# Patient Record
Sex: Female | Born: 1960 | Race: White | Hispanic: No | Marital: Married | Smoking: Former smoker
Health system: Southern US, Community
[De-identification: ages and names within clinical notes are randomized; demographics above are authoritative.]

## PROBLEM LIST (undated history)

## (undated) DIAGNOSIS — Z8041 Family history of malignant neoplasm of ovary: Secondary | ICD-10-CM

## (undated) DIAGNOSIS — M1990 Unspecified osteoarthritis, unspecified site: Secondary | ICD-10-CM

## (undated) DIAGNOSIS — N736 Female pelvic peritoneal adhesions (postinfective): Secondary | ICD-10-CM

## (undated) DIAGNOSIS — K219 Gastro-esophageal reflux disease without esophagitis: Secondary | ICD-10-CM

## (undated) DIAGNOSIS — Z800 Family history of malignant neoplasm of digestive organs: Secondary | ICD-10-CM

## (undated) DIAGNOSIS — R112 Nausea with vomiting, unspecified: Secondary | ICD-10-CM

## (undated) DIAGNOSIS — N189 Chronic kidney disease, unspecified: Secondary | ICD-10-CM

## (undated) DIAGNOSIS — F32A Depression, unspecified: Secondary | ICD-10-CM

## (undated) DIAGNOSIS — Z923 Personal history of irradiation: Secondary | ICD-10-CM

## (undated) DIAGNOSIS — C801 Malignant (primary) neoplasm, unspecified: Secondary | ICD-10-CM

## (undated) DIAGNOSIS — K5790 Diverticulosis of intestine, part unspecified, without perforation or abscess without bleeding: Secondary | ICD-10-CM

## (undated) DIAGNOSIS — K649 Unspecified hemorrhoids: Secondary | ICD-10-CM

## (undated) DIAGNOSIS — K222 Esophageal obstruction: Secondary | ICD-10-CM

## (undated) DIAGNOSIS — Z8489 Family history of other specified conditions: Secondary | ICD-10-CM

## (undated) DIAGNOSIS — R1013 Epigastric pain: Secondary | ICD-10-CM

## (undated) DIAGNOSIS — R0600 Dyspnea, unspecified: Secondary | ICD-10-CM

## (undated) DIAGNOSIS — R51 Headache: Secondary | ICD-10-CM

## (undated) DIAGNOSIS — M722 Plantar fascial fibromatosis: Secondary | ICD-10-CM

## (undated) DIAGNOSIS — M6740 Ganglion, unspecified site: Secondary | ICD-10-CM

## (undated) DIAGNOSIS — J45909 Unspecified asthma, uncomplicated: Secondary | ICD-10-CM

## (undated) DIAGNOSIS — I10 Essential (primary) hypertension: Secondary | ICD-10-CM

## (undated) DIAGNOSIS — E039 Hypothyroidism, unspecified: Secondary | ICD-10-CM

## (undated) DIAGNOSIS — F329 Major depressive disorder, single episode, unspecified: Secondary | ICD-10-CM

## (undated) DIAGNOSIS — C50919 Malignant neoplasm of unspecified site of unspecified female breast: Secondary | ICD-10-CM

## (undated) DIAGNOSIS — F419 Anxiety disorder, unspecified: Secondary | ICD-10-CM

## (undated) DIAGNOSIS — R519 Headache, unspecified: Secondary | ICD-10-CM

## (undated) DIAGNOSIS — Z9889 Other specified postprocedural states: Secondary | ICD-10-CM

## (undated) HISTORY — DX: Personal history of irradiation: Z92.3

## (undated) HISTORY — DX: Unspecified hemorrhoids: K64.9

## (undated) HISTORY — DX: Hypothyroidism, unspecified: E03.9

## (undated) HISTORY — DX: Family history of malignant neoplasm of digestive organs: Z80.0

## (undated) HISTORY — DX: Anxiety disorder, unspecified: F41.9

## (undated) HISTORY — PX: CHOLECYSTECTOMY: SHX55

## (undated) HISTORY — DX: Essential (primary) hypertension: I10

## (undated) HISTORY — DX: Malignant neoplasm of unspecified site of unspecified female breast: C50.919

## (undated) HISTORY — DX: Epigastric pain: R10.13

## (undated) HISTORY — PX: MASTECTOMY: SHX3

## (undated) HISTORY — DX: Unspecified osteoarthritis, unspecified site: M19.90

## (undated) HISTORY — DX: Gastro-esophageal reflux disease without esophagitis: K21.9

## (undated) HISTORY — DX: Diverticulosis of intestine, part unspecified, without perforation or abscess without bleeding: K57.90

## (undated) HISTORY — DX: Ganglion, unspecified site: M67.40

## (undated) HISTORY — PX: HEMORRHOID SURGERY: SHX153

## (undated) HISTORY — PX: TONSILLECTOMY: SUR1361

## (undated) HISTORY — PX: ABDOMINAL HYSTERECTOMY: SHX81

## (undated) HISTORY — DX: Esophageal obstruction: K22.2

## (undated) HISTORY — DX: Family history of malignant neoplasm of ovary: Z80.41

## (undated) HISTORY — PX: OTHER SURGICAL HISTORY: SHX169

## (undated) HISTORY — PX: TUBAL LIGATION: SHX77

---

## 1997-06-29 ENCOUNTER — Other Ambulatory Visit: Admission: RE | Admit: 1997-06-29 | Discharge: 1997-06-29 | Payer: Self-pay | Admitting: Obstetrics and Gynecology

## 1997-08-08 ENCOUNTER — Ambulatory Visit (HOSPITAL_COMMUNITY): Admission: RE | Admit: 1997-08-08 | Discharge: 1997-08-08 | Payer: Self-pay | Admitting: Obstetrics and Gynecology

## 1997-09-07 ENCOUNTER — Ambulatory Visit (HOSPITAL_COMMUNITY): Admission: RE | Admit: 1997-09-07 | Discharge: 1997-09-07 | Payer: Self-pay | Admitting: Obstetrics and Gynecology

## 1999-04-21 ENCOUNTER — Other Ambulatory Visit: Admission: RE | Admit: 1999-04-21 | Discharge: 1999-04-21 | Payer: Self-pay | Admitting: Obstetrics and Gynecology

## 1999-04-23 ENCOUNTER — Ambulatory Visit (HOSPITAL_COMMUNITY): Admission: RE | Admit: 1999-04-23 | Discharge: 1999-04-23 | Payer: Self-pay | Admitting: Obstetrics and Gynecology

## 1999-04-23 ENCOUNTER — Encounter: Payer: Self-pay | Admitting: Obstetrics and Gynecology

## 1999-04-28 ENCOUNTER — Encounter: Payer: Self-pay | Admitting: Obstetrics and Gynecology

## 1999-04-28 ENCOUNTER — Ambulatory Visit (HOSPITAL_COMMUNITY): Admission: RE | Admit: 1999-04-28 | Discharge: 1999-04-28 | Payer: Self-pay | Admitting: Obstetrics and Gynecology

## 1999-05-05 ENCOUNTER — Encounter: Payer: Self-pay | Admitting: Obstetrics and Gynecology

## 1999-05-05 ENCOUNTER — Ambulatory Visit (HOSPITAL_COMMUNITY): Admission: RE | Admit: 1999-05-05 | Discharge: 1999-05-05 | Payer: Self-pay | Admitting: Obstetrics and Gynecology

## 1999-10-14 ENCOUNTER — Encounter: Payer: Self-pay | Admitting: General Surgery

## 1999-10-14 ENCOUNTER — Ambulatory Visit (HOSPITAL_COMMUNITY): Admission: RE | Admit: 1999-10-14 | Discharge: 1999-10-14 | Payer: Self-pay | Admitting: General Surgery

## 2000-04-15 ENCOUNTER — Encounter: Payer: Self-pay | Admitting: General Surgery

## 2000-04-15 ENCOUNTER — Ambulatory Visit (HOSPITAL_COMMUNITY): Admission: RE | Admit: 2000-04-15 | Discharge: 2000-04-15 | Payer: Self-pay | Admitting: General Surgery

## 2000-06-14 ENCOUNTER — Ambulatory Visit (HOSPITAL_COMMUNITY): Admission: RE | Admit: 2000-06-14 | Discharge: 2000-06-14 | Payer: Self-pay | Admitting: Internal Medicine

## 2000-06-14 ENCOUNTER — Encounter: Payer: Self-pay | Admitting: Internal Medicine

## 2001-03-17 ENCOUNTER — Other Ambulatory Visit: Admission: RE | Admit: 2001-03-17 | Discharge: 2001-03-17 | Payer: Self-pay | Admitting: Obstetrics and Gynecology

## 2001-03-21 ENCOUNTER — Encounter: Payer: Self-pay | Admitting: Obstetrics and Gynecology

## 2001-03-21 ENCOUNTER — Ambulatory Visit (HOSPITAL_COMMUNITY): Admission: RE | Admit: 2001-03-21 | Discharge: 2001-03-21 | Payer: Self-pay | Admitting: Obstetrics and Gynecology

## 2001-04-11 ENCOUNTER — Encounter: Payer: Self-pay | Admitting: General Surgery

## 2001-04-11 ENCOUNTER — Ambulatory Visit (HOSPITAL_COMMUNITY): Admission: RE | Admit: 2001-04-11 | Discharge: 2001-04-11 | Payer: Self-pay | Admitting: General Surgery

## 2002-01-24 ENCOUNTER — Ambulatory Visit (HOSPITAL_COMMUNITY): Admission: RE | Admit: 2002-01-24 | Discharge: 2002-01-24 | Payer: Self-pay | Admitting: Internal Medicine

## 2002-01-24 ENCOUNTER — Encounter: Payer: Self-pay | Admitting: Internal Medicine

## 2002-03-31 ENCOUNTER — Ambulatory Visit (HOSPITAL_COMMUNITY): Admission: RE | Admit: 2002-03-31 | Discharge: 2002-03-31 | Payer: Self-pay | Admitting: General Surgery

## 2002-03-31 ENCOUNTER — Encounter: Payer: Self-pay | Admitting: General Surgery

## 2002-05-05 ENCOUNTER — Ambulatory Visit (HOSPITAL_COMMUNITY): Admission: RE | Admit: 2002-05-05 | Discharge: 2002-05-05 | Payer: Self-pay | Admitting: Obstetrics and Gynecology

## 2002-05-05 ENCOUNTER — Encounter: Payer: Self-pay | Admitting: Obstetrics and Gynecology

## 2002-05-30 ENCOUNTER — Encounter: Payer: Self-pay | Admitting: Internal Medicine

## 2003-04-30 ENCOUNTER — Emergency Department (HOSPITAL_COMMUNITY): Admission: EM | Admit: 2003-04-30 | Discharge: 2003-04-30 | Payer: Self-pay | Admitting: Emergency Medicine

## 2003-05-10 ENCOUNTER — Observation Stay (HOSPITAL_COMMUNITY): Admission: RE | Admit: 2003-05-10 | Discharge: 2003-05-11 | Payer: Self-pay | Admitting: Internal Medicine

## 2003-05-10 ENCOUNTER — Encounter: Payer: Self-pay | Admitting: Internal Medicine

## 2003-07-06 ENCOUNTER — Ambulatory Visit (HOSPITAL_COMMUNITY): Admission: RE | Admit: 2003-07-06 | Discharge: 2003-07-06 | Payer: Self-pay | Admitting: Urology

## 2003-07-06 ENCOUNTER — Ambulatory Visit (HOSPITAL_BASED_OUTPATIENT_CLINIC_OR_DEPARTMENT_OTHER): Admission: RE | Admit: 2003-07-06 | Discharge: 2003-07-06 | Payer: Self-pay | Admitting: Urology

## 2003-07-19 ENCOUNTER — Inpatient Hospital Stay (HOSPITAL_COMMUNITY): Admission: RE | Admit: 2003-07-19 | Discharge: 2003-07-21 | Payer: Self-pay | Admitting: Urology

## 2003-09-17 ENCOUNTER — Emergency Department (HOSPITAL_COMMUNITY): Admission: EM | Admit: 2003-09-17 | Discharge: 2003-09-17 | Payer: Self-pay | Admitting: Emergency Medicine

## 2003-11-26 ENCOUNTER — Ambulatory Visit (HOSPITAL_COMMUNITY): Admission: RE | Admit: 2003-11-26 | Discharge: 2003-11-26 | Payer: Self-pay | Admitting: Urology

## 2004-04-14 ENCOUNTER — Ambulatory Visit: Payer: Self-pay | Admitting: Internal Medicine

## 2004-05-22 ENCOUNTER — Ambulatory Visit (HOSPITAL_COMMUNITY): Admission: RE | Admit: 2004-05-22 | Discharge: 2004-05-22 | Payer: Self-pay | Admitting: Urology

## 2005-03-18 ENCOUNTER — Ambulatory Visit: Payer: Self-pay | Admitting: Internal Medicine

## 2005-04-13 ENCOUNTER — Other Ambulatory Visit: Admission: RE | Admit: 2005-04-13 | Discharge: 2005-04-13 | Payer: Self-pay | Admitting: Obstetrics and Gynecology

## 2005-06-17 ENCOUNTER — Ambulatory Visit: Payer: Self-pay | Admitting: Internal Medicine

## 2005-06-22 ENCOUNTER — Ambulatory Visit: Payer: Self-pay | Admitting: Internal Medicine

## 2006-06-14 ENCOUNTER — Ambulatory Visit: Payer: Self-pay | Admitting: Internal Medicine

## 2006-06-14 LAB — CONVERTED CEMR LAB
ALT: 13 units/L (ref 0–40)
AST: 21 units/L (ref 0–37)
Albumin: 3.8 g/dL (ref 3.5–5.2)
Alkaline Phosphatase: 51 units/L (ref 39–117)
BUN: 10 mg/dL (ref 6–23)
Basophils Absolute: 0.1 10*3/uL (ref 0.0–0.1)
Basophils Relative: 1 % (ref 0.0–1.0)
Bilirubin Urine: NEGATIVE
Bilirubin, Direct: 0.1 mg/dL (ref 0.0–0.3)
CO2: 26 meq/L (ref 19–32)
Calcium: 8.8 mg/dL (ref 8.4–10.5)
Chloride: 110 meq/L (ref 96–112)
Cholesterol: 111 mg/dL (ref 0–200)
Creatinine, Ser: 0.8 mg/dL (ref 0.4–1.2)
Eosinophils Absolute: 0.1 10*3/uL (ref 0.0–0.6)
Eosinophils Relative: 2.3 % (ref 0.0–5.0)
GFR calc Af Amer: 100 mL/min
GFR calc non Af Amer: 83 mL/min
Glucose, Bld: 108 mg/dL — ABNORMAL HIGH (ref 70–99)
HCT: 36.2 % (ref 36.0–46.0)
HDL: 45.8 mg/dL (ref >39.0)
Hemoglobin, Urine: NEGATIVE
Hemoglobin: 11.9 g/dL — ABNORMAL LOW (ref 12.0–15.0)
Ketones, ur: NEGATIVE mg/dL
LDL Cholesterol: 47 mg/dL (ref 0–99)
Leukocytes, UA: NEGATIVE
Lymphocytes Relative: 37 % (ref 12.0–46.0)
MCHC: 32.7 g/dL (ref 30.0–36.0)
MCV: 80 fL (ref 78.0–100.0)
Monocytes Absolute: 0.6 10*3/uL (ref 0.2–0.7)
Monocytes Relative: 9.7 % (ref 3.0–11.0)
Neutro Abs: 2.8 10*3/uL (ref 1.4–7.7)
Neutrophils Relative %: 50 % (ref 43.0–77.0)
Nitrite: NEGATIVE
Platelets: 234 10*3/uL (ref 150–400)
Potassium: 3.8 meq/L (ref 3.5–5.1)
RBC: 4.53 M/uL (ref 3.87–5.11)
RDW: 14.8 % — ABNORMAL HIGH (ref 11.5–14.6)
Sodium: 142 meq/L (ref 135–145)
Specific Gravity, Urine: <=1.005 (ref 1.000–1.03)
TSH: 0.78 microintl units/mL (ref 0.35–5.50)
Total Bilirubin: 0.8 mg/dL (ref 0.3–1.2)
Total CHOL/HDL Ratio: 2.4
Total Protein, Urine: NEGATIVE mg/dL
Total Protein: 6.2 g/dL (ref 6.0–8.3)
Triglycerides: 93 mg/dL (ref 0–149)
Urine Glucose: NEGATIVE mg/dL
Urobilinogen, UA: 0.2 (ref 0.0–1.0)
VLDL: 19 mg/dL (ref 0–40)
WBC: 5.7 10*3/uL (ref 4.5–10.5)
pH: 7 (ref 5.0–8.0)

## 2006-06-24 ENCOUNTER — Ambulatory Visit: Payer: Self-pay | Admitting: Internal Medicine

## 2007-01-19 ENCOUNTER — Encounter: Payer: Self-pay | Admitting: Internal Medicine

## 2007-02-02 ENCOUNTER — Telehealth: Payer: Self-pay | Admitting: Internal Medicine

## 2007-02-02 ENCOUNTER — Ambulatory Visit: Payer: Self-pay | Admitting: Internal Medicine

## 2007-02-02 LAB — CONVERTED CEMR LAB
ALT: 14 units/L (ref 0–35)
AST: 16 units/L (ref 0–37)
Albumin: 4 g/dL (ref 3.5–5.2)
Alkaline Phosphatase: 61 units/L (ref 39–117)
Amylase: 66 units/L (ref 27–131)
Bilirubin, Direct: 0.1 mg/dL (ref 0.0–0.3)
Lipase: 26 units/L (ref 11.0–59.0)
Total Bilirubin: 0.6 mg/dL (ref 0.3–1.2)
Total Protein: 6.8 g/dL (ref 6.0–8.3)

## 2007-02-04 ENCOUNTER — Encounter: Payer: Self-pay | Admitting: Internal Medicine

## 2007-05-06 ENCOUNTER — Encounter: Payer: Self-pay | Admitting: Internal Medicine

## 2007-06-08 ENCOUNTER — Telehealth: Payer: Self-pay | Admitting: Internal Medicine

## 2007-06-13 ENCOUNTER — Telehealth: Payer: Self-pay | Admitting: Family Medicine

## 2007-06-13 ENCOUNTER — Ambulatory Visit: Payer: Self-pay | Admitting: Internal Medicine

## 2007-06-13 ENCOUNTER — Encounter (INDEPENDENT_AMBULATORY_CARE_PROVIDER_SITE_OTHER): Payer: Self-pay | Admitting: *Deleted

## 2007-06-14 ENCOUNTER — Telehealth: Payer: Self-pay | Admitting: Internal Medicine

## 2007-06-14 LAB — CONVERTED CEMR LAB
ALT: 33 units/L (ref 0–35)
AST: 27 units/L (ref 0–37)
Albumin: 4 g/dL (ref 3.5–5.2)
Alkaline Phosphatase: 54 units/L (ref 39–117)
BUN: 8 mg/dL (ref 6–23)
Basophils Absolute: 0 10*3/uL (ref 0.0–0.1)
Basophils Relative: 0.1 % (ref 0.0–1.0)
Bilirubin, Direct: 0.1 mg/dL (ref 0.0–0.3)
CO2: 28 meq/L (ref 19–32)
Calcium: 8.7 mg/dL (ref 8.4–10.5)
Chloride: 109 meq/L (ref 96–112)
Creatinine, Ser: 0.8 mg/dL (ref 0.4–1.2)
Eosinophils Absolute: 0.2 10*3/uL (ref 0.0–0.7)
Eosinophils Relative: 3.3 % (ref 0.0–5.0)
GFR calc Af Amer: 100 mL/min
GFR calc non Af Amer: 82 mL/min
Glucose, Bld: 91 mg/dL (ref 70–99)
HCT: 39.7 % (ref 36.0–46.0)
Hemoglobin: 12.9 g/dL (ref 12.0–15.0)
Lymphocytes Relative: 41.2 % (ref 12.0–46.0)
MCHC: 32.6 g/dL (ref 30.0–36.0)
MCV: 79 fL (ref 78.0–100.0)
Monocytes Absolute: 0.6 10*3/uL (ref 0.1–1.0)
Monocytes Relative: 10.1 % (ref 3.0–12.0)
Neutro Abs: 2.7 10*3/uL (ref 1.4–7.7)
Neutrophils Relative %: 45.3 % (ref 43.0–77.0)
Platelets: 242 10*3/uL (ref 150–400)
Potassium: 3.7 meq/L (ref 3.5–5.1)
RBC: 5.02 M/uL (ref 3.87–5.11)
RDW: 15.5 % — ABNORMAL HIGH (ref 11.5–14.6)
Sodium: 141 meq/L (ref 135–145)
TSH: 1.41 microintl units/mL (ref 0.35–5.50)
Total Bilirubin: 0.6 mg/dL (ref 0.3–1.2)
Total Protein: 6.6 g/dL (ref 6.0–8.3)
WBC: 6 10*3/uL (ref 4.5–10.5)

## 2007-06-16 ENCOUNTER — Encounter: Payer: Self-pay | Admitting: Internal Medicine

## 2007-06-23 DIAGNOSIS — K222 Esophageal obstruction: Secondary | ICD-10-CM | POA: Insufficient documentation

## 2007-06-23 DIAGNOSIS — K8020 Calculus of gallbladder without cholecystitis without obstruction: Secondary | ICD-10-CM | POA: Insufficient documentation

## 2007-06-23 DIAGNOSIS — G4730 Sleep apnea, unspecified: Secondary | ICD-10-CM | POA: Insufficient documentation

## 2007-06-23 DIAGNOSIS — E039 Hypothyroidism, unspecified: Secondary | ICD-10-CM | POA: Insufficient documentation

## 2007-06-23 DIAGNOSIS — K649 Unspecified hemorrhoids: Secondary | ICD-10-CM | POA: Insufficient documentation

## 2007-06-23 DIAGNOSIS — M1990 Unspecified osteoarthritis, unspecified site: Secondary | ICD-10-CM | POA: Insufficient documentation

## 2007-06-23 DIAGNOSIS — M129 Arthropathy, unspecified: Secondary | ICD-10-CM | POA: Insufficient documentation

## 2007-06-23 DIAGNOSIS — K219 Gastro-esophageal reflux disease without esophagitis: Secondary | ICD-10-CM | POA: Insufficient documentation

## 2007-07-04 ENCOUNTER — Ambulatory Visit: Payer: Self-pay | Admitting: Internal Medicine

## 2007-07-04 ENCOUNTER — Encounter
Admission: RE | Admit: 2007-07-04 | Discharge: 2007-10-02 | Payer: Self-pay | Admitting: Physical Medicine & Rehabilitation

## 2007-07-04 DIAGNOSIS — R1013 Epigastric pain: Secondary | ICD-10-CM | POA: Insufficient documentation

## 2007-07-04 DIAGNOSIS — F411 Generalized anxiety disorder: Secondary | ICD-10-CM | POA: Insufficient documentation

## 2007-07-07 ENCOUNTER — Ambulatory Visit: Payer: Self-pay | Admitting: Physical Medicine & Rehabilitation

## 2007-07-08 ENCOUNTER — Ambulatory Visit: Payer: Self-pay | Admitting: Internal Medicine

## 2007-08-08 ENCOUNTER — Ambulatory Visit: Payer: Self-pay | Admitting: Physical Medicine & Rehabilitation

## 2007-08-18 ENCOUNTER — Encounter
Admission: RE | Admit: 2007-08-18 | Discharge: 2007-08-18 | Payer: Self-pay | Admitting: Physical Medicine & Rehabilitation

## 2007-09-05 ENCOUNTER — Ambulatory Visit: Payer: Self-pay | Admitting: Physical Medicine & Rehabilitation

## 2007-09-14 ENCOUNTER — Telehealth: Payer: Self-pay | Admitting: Internal Medicine

## 2007-09-30 ENCOUNTER — Encounter
Admission: RE | Admit: 2007-09-30 | Discharge: 2007-10-03 | Payer: Self-pay | Admitting: Physical Medicine & Rehabilitation

## 2007-10-03 ENCOUNTER — Ambulatory Visit: Payer: Self-pay | Admitting: Physical Medicine & Rehabilitation

## 2007-10-04 ENCOUNTER — Ambulatory Visit: Payer: Self-pay | Admitting: Internal Medicine

## 2007-10-04 DIAGNOSIS — M6740 Ganglion, unspecified site: Secondary | ICD-10-CM | POA: Insufficient documentation

## 2007-11-07 ENCOUNTER — Encounter (INDEPENDENT_AMBULATORY_CARE_PROVIDER_SITE_OTHER): Payer: Self-pay | Admitting: Obstetrics and Gynecology

## 2007-11-07 ENCOUNTER — Ambulatory Visit (HOSPITAL_COMMUNITY): Admission: RE | Admit: 2007-11-07 | Discharge: 2007-11-08 | Payer: Self-pay | Admitting: Obstetrics and Gynecology

## 2007-12-30 ENCOUNTER — Encounter
Admission: RE | Admit: 2007-12-30 | Discharge: 2008-03-29 | Payer: Self-pay | Admitting: Physical Medicine & Rehabilitation

## 2008-01-02 ENCOUNTER — Ambulatory Visit: Payer: Self-pay | Admitting: Physical Medicine & Rehabilitation

## 2008-02-13 ENCOUNTER — Ambulatory Visit: Payer: Self-pay | Admitting: Physical Medicine & Rehabilitation

## 2008-03-12 ENCOUNTER — Ambulatory Visit: Payer: Self-pay | Admitting: Physical Medicine & Rehabilitation

## 2008-03-20 ENCOUNTER — Telehealth: Payer: Self-pay | Admitting: Internal Medicine

## 2008-04-10 ENCOUNTER — Ambulatory Visit: Payer: Self-pay | Admitting: Physical Medicine & Rehabilitation

## 2008-04-10 ENCOUNTER — Encounter
Admission: RE | Admit: 2008-04-10 | Discharge: 2008-04-10 | Payer: Self-pay | Admitting: Physical Medicine & Rehabilitation

## 2008-04-13 ENCOUNTER — Encounter: Admission: RE | Admit: 2008-04-13 | Discharge: 2008-07-12 | Payer: Self-pay | Admitting: Anesthesiology

## 2008-04-17 ENCOUNTER — Ambulatory Visit: Payer: Self-pay | Admitting: Anesthesiology

## 2008-05-15 ENCOUNTER — Ambulatory Visit: Payer: Self-pay | Admitting: Physical Medicine & Rehabilitation

## 2008-05-15 ENCOUNTER — Telehealth: Payer: Self-pay | Admitting: Internal Medicine

## 2008-05-17 ENCOUNTER — Telehealth (INDEPENDENT_AMBULATORY_CARE_PROVIDER_SITE_OTHER): Payer: Self-pay | Admitting: *Deleted

## 2008-05-23 ENCOUNTER — Encounter: Payer: Self-pay | Admitting: Internal Medicine

## 2008-06-12 ENCOUNTER — Ambulatory Visit: Payer: Self-pay | Admitting: Anesthesiology

## 2008-06-21 ENCOUNTER — Ambulatory Visit (HOSPITAL_BASED_OUTPATIENT_CLINIC_OR_DEPARTMENT_OTHER): Admission: RE | Admit: 2008-06-21 | Discharge: 2008-06-21 | Payer: Self-pay | Admitting: Orthopedic Surgery

## 2008-06-25 ENCOUNTER — Encounter: Payer: Self-pay | Admitting: Internal Medicine

## 2008-07-26 ENCOUNTER — Encounter: Payer: Self-pay | Admitting: Internal Medicine

## 2008-08-23 ENCOUNTER — Ambulatory Visit: Payer: Self-pay | Admitting: Internal Medicine

## 2008-08-23 LAB — CONVERTED CEMR LAB
ALT: 19 units/L (ref 0–35)
AST: 22 units/L (ref 0–37)
Albumin: 4 g/dL (ref 3.5–5.2)
Alkaline Phosphatase: 67 units/L (ref 39–117)
BUN: 10 mg/dL (ref 6–23)
Basophils Absolute: 0 10*3/uL (ref 0.0–0.1)
Basophils Relative: 0.7 % (ref 0.0–3.0)
Bilirubin Urine: NEGATIVE
Bilirubin, Direct: 0.2 mg/dL (ref 0.0–0.3)
CO2: 30 meq/L (ref 19–32)
Calcium: 9.5 mg/dL (ref 8.4–10.5)
Chloride: 105 meq/L (ref 96–112)
Cholesterol: 122 mg/dL (ref 0–200)
Creatinine, Ser: 0.9 mg/dL (ref 0.4–1.2)
Eosinophils Absolute: 0.1 10*3/uL (ref 0.0–0.7)
Eosinophils Relative: 2.1 % (ref 0.0–5.0)
GFR calc non Af Amer: 71.4 mL/min (ref >60)
Glucose, Bld: 103 mg/dL — ABNORMAL HIGH (ref 70–99)
HCT: 41.7 % (ref 36.0–46.0)
HDL: 65.6 mg/dL (ref >39.00)
Hemoglobin: 14 g/dL (ref 12.0–15.0)
Ketones, ur: NEGATIVE mg/dL
LDL Cholesterol: 45 mg/dL (ref 0–99)
Leukocytes, UA: NEGATIVE
Lymphocytes Relative: 31.5 % (ref 12.0–46.0)
Lymphs Abs: 1.9 10*3/uL (ref 0.7–4.0)
MCHC: 33.5 g/dL (ref 30.0–36.0)
MCV: 87.7 fL (ref 78.0–100.0)
Monocytes Absolute: 0.6 10*3/uL (ref 0.1–1.0)
Monocytes Relative: 9.7 % (ref 3.0–12.0)
Neutro Abs: 3.4 10*3/uL (ref 1.4–7.7)
Neutrophils Relative %: 56 % (ref 43.0–77.0)
Nitrite: NEGATIVE
Platelets: 229 10*3/uL (ref 150.0–400.0)
Potassium: 4.5 meq/L (ref 3.5–5.1)
RBC: 4.75 M/uL (ref 3.87–5.11)
RDW: 15.7 % — ABNORMAL HIGH (ref 11.5–14.6)
Sodium: 142 meq/L (ref 135–145)
Specific Gravity, Urine: 1.01 (ref 1.000–1.030)
TSH: 0.45 microintl units/mL (ref 0.35–5.50)
Total Bilirubin: 0.7 mg/dL (ref 0.3–1.2)
Total CHOL/HDL Ratio: 2
Total Protein, Urine: NEGATIVE mg/dL
Total Protein: 6.5 g/dL (ref 6.0–8.3)
Triglycerides: 57 mg/dL (ref 0.0–149.0)
Urine Glucose: NEGATIVE mg/dL
Urobilinogen, UA: 0.2 (ref 0.0–1.0)
VLDL: 11.4 mg/dL (ref 0.0–40.0)
WBC: 6 10*3/uL (ref 4.5–10.5)
pH: 5.5 (ref 5.0–8.0)

## 2008-09-05 ENCOUNTER — Ambulatory Visit: Payer: Self-pay | Admitting: Internal Medicine

## 2009-01-02 ENCOUNTER — Telehealth: Payer: Self-pay | Admitting: Internal Medicine

## 2009-01-03 ENCOUNTER — Other Ambulatory Visit: Admission: RE | Admit: 2009-01-03 | Discharge: 2009-01-03 | Payer: Self-pay | Admitting: Radiology

## 2009-01-03 ENCOUNTER — Ambulatory Visit: Payer: Self-pay | Admitting: Internal Medicine

## 2009-01-03 DIAGNOSIS — R233 Spontaneous ecchymoses: Secondary | ICD-10-CM | POA: Insufficient documentation

## 2009-01-03 LAB — CONVERTED CEMR LAB
AntiThromb III Func: 125 % (ref 76–126)
Anticardiolipin IgA: <3 (ref <10)
Anticardiolipin IgG: 4 (ref <10)
Anticardiolipin IgM: <3 (ref <10)
Homocysteine: 12.4 micromoles/L (ref 4.0–15.4)
Protein C Activity: 190 % — ABNORMAL HIGH (ref 75–133)
Protein S Activity: 113 % (ref 69–129)
Protein S Ag, Total: 127 % (ref 70–140)

## 2009-01-07 ENCOUNTER — Telehealth: Payer: Self-pay | Admitting: Internal Medicine

## 2009-03-18 ENCOUNTER — Telehealth: Payer: Self-pay | Admitting: Internal Medicine

## 2009-07-16 ENCOUNTER — Telehealth: Payer: Self-pay | Admitting: Internal Medicine

## 2009-07-16 DIAGNOSIS — N6009 Solitary cyst of unspecified breast: Secondary | ICD-10-CM | POA: Insufficient documentation

## 2009-07-23 ENCOUNTER — Encounter: Payer: Self-pay | Admitting: Internal Medicine

## 2009-07-23 ENCOUNTER — Telehealth: Payer: Self-pay | Admitting: Internal Medicine

## 2009-07-24 ENCOUNTER — Encounter: Payer: Self-pay | Admitting: Internal Medicine

## 2009-07-31 ENCOUNTER — Encounter: Payer: Self-pay | Admitting: Internal Medicine

## 2009-08-07 ENCOUNTER — Encounter: Payer: Self-pay | Admitting: Internal Medicine

## 2009-08-20 ENCOUNTER — Telehealth: Payer: Self-pay | Admitting: Internal Medicine

## 2009-09-16 ENCOUNTER — Telehealth: Payer: Self-pay | Admitting: Internal Medicine

## 2009-12-05 ENCOUNTER — Ambulatory Visit: Payer: Self-pay | Admitting: Internal Medicine

## 2010-01-28 ENCOUNTER — Encounter: Payer: Self-pay | Admitting: Internal Medicine

## 2010-03-16 ENCOUNTER — Encounter: Payer: Self-pay | Admitting: Urology

## 2010-03-25 NOTE — Progress Notes (Signed)
Summary: PA--Pantoprazole  Phone Note From Pharmacy   Summary of Call: PA request--Pantoprazole. Approved x1 year and insurance company will fax approval letter. Initial call taken by: Lucious Groves,  Jul 23, 2009 9:20 AM     Appended Document: PA--Pantoprazole approval letter recieved and sent down to scanning/ ab

## 2010-03-25 NOTE — Progress Notes (Signed)
  Phone Note Refill Request Message from:  Fax from Pharmacy on March 18, 2009 8:33 AM  Refills Requested: Medication #1:  ALPRAZOLAM 1 MG  TABS 1 two times a day   Last Refilled: 09/05/2008 Please Advise refill. last office visit was 01/03/2009  Initial call taken by: Ami Bullins CMA,  March 18, 2009 8:34 AM  Follow-up for Phone Call        ok for refill x 5 Follow-up by: Jacques Navy MD,  March 18, 2009 8:35 AM    Prescriptions: ALPRAZOLAM 1 MG  TABS (ALPRAZOLAM) 1 two times a day  #60 x 5   Entered by:   Ami Bullins CMA   Authorized by:   Jacques Navy MD   Signed by:   Bill Salinas CMA on 03/18/2009   Method used:   Telephoned to ...       [facility] [address] [phone]* (retail)       [address]       [place], [place]  [address]       Ph: [phone]       Fax: [phone]   RxID:   [id number]

## 2010-03-25 NOTE — Progress Notes (Signed)
  Phone Note Refill Request Message from:  Fax from Pharmacy on September 16, 2009 9:09 AM  Refills Requested: Medication #1:  ALPRAZOLAM 1 MG  TABS 1 two times a day Please Advise refill  Initial call taken by: Ami Bullins CMA,  September 16, 2009 9:09 AM  Follow-up for Phone Call        ok to refill # 60 x 5 Follow-up by: Jacques Navy MD,  September 16, 2009 10:42 AM    Prescriptions: ALPRAZOLAM 1 MG  TABS (ALPRAZOLAM) 1 two times a day  #60 x 5   Entered by:   Ami Bullins CMA   Authorized by:   Jacques Navy MD   Signed by:   Bill Salinas CMA on 09/17/2009   Method used:   Telephoned to ...       [id number]       [address]       [place], [place]  [address]       Ph: [phone]       Fax: [phone]   RxID:   [id number]

## 2010-03-25 NOTE — Progress Notes (Signed)
Summary: REFERRAL  Phone Note Other Incoming   Caller: pt Summary of Call: Pt need referral for [facility]. She states she saw them back in 2010 for a cystic structure and states that she thinks she is having the same prob. Pt has an appt next wens at [facility] and needs ref to be sent for her insurance to cover. Please Advise Initial call taken by: Ami Bullins CMA,  Jul 16, 2009 3:54 PM  Follow-up for Phone Call        Pt called again, she feels that she may have to drain the cyst again. She has had titanium clip that was placed last time to mark the area. Was also told that cyst may need to be drained again in the future. Pt needs referral for diagnostic mamogram, her apt is scheduled for next wednesday.  Follow-up by: Lamar Sprinkles, CMA,  Jul 18, 2009 11:22 AM  Additional Follow-up for Phone Call Additional follow up Details #1::        k. [facility] notified Additional Follow-up by: Jacques Navy MD,  Jul 18, 2009 5:53 PM  New Problems: BREAST CYST (ICD-610.0)   New Problems: BREAST CYST (ICD-610.0)

## 2010-03-25 NOTE — Assessment & Plan Note (Signed)
Summary: SHOULDERBLADE PAIN--STC   Vital Signs:  Patient profile:   50 year old female Height:      67 inches Weight:      145 pounds BMI:     22.79 O2 Sat:      97 % on Room air Temp:     98.6 degrees F oral Pulse rate:   94 / minute BP sitting:   122 / 88  (left arm) Cuff size:   regular  Vitals Entered By: Bill Salinas CMA (December 05, 2009 4:40 PM)  O2 Flow:  Room air CC: pt here for evaluation of 1 week history on right shoulder pain./ ab   Primary Care Provider:  Norins  CC:  pt here for evaluation of 1 week history on right shoulder pain./ ab.  History of Present Illness: Patient presents for c/o chest wall pain that is worse at the right medial scapula but involves the neck and anterior chest wall. She denies any recollection of trauma or any precipitating event. She has tried otc NSAIDs without relief. She has no paresthesia or muscle weakness. She is able to complete all her normal activities of daily living.   Current Medications (verified): 1)  Allegra 180 Mg Tabs (Fexofenadine Hcl) .... Take 1 Tablet By Mouth Once A Day As Needed 2)  Alprazolam 1 Mg  Tabs (Alprazolam) .Marland Kitchen.. 1 Two Times A Day 3)  Synthroid 50 Mcg  Tabs (Levothyroxine Sodium) .... Take 1 Tablet By Mouth Once A Day 4)  Pantoprazole Sodium 40 Mg  Tbec (Pantoprazole Sodium) .Marland Kitchen.. 1 By Mouth Q Am 5)  Hydrocodone-Acetaminophen 5-325 Mg  Tabs (Hydrocodone-Acetaminophen) .... One Tablet Every 6 Hours As Needed 6)  Meloxicam 15 Mg Tabs (Meloxicam) .Marland Kitchen.. 1 By Mouth Once Daily 7)  Mupirocin 2 % Oint (Mupirocin) .... Apply To Sore in Right Nose Two Times A Day. 8)  Vitamin D 1000 Unit Tabs (Cholecalciferol) .Marland Kitchen.. 1 Tablet Daily  Allergies (verified): 1)  Promethazine Hcl (Promethazine Hcl)  Past History:  Past Medical History: Last updated: 09/05/2008 GANGLION CYST (ICD-727.43) FAMILY HX COLON CANCER (ICD-V16.0) ABDOMINAL PAIN-EPIGASTRIC (ICD-789.06) ANXIETY DISORDER (ICD-300.00) ESOPHAGEAL STRICTURE  (ICD-530.3) GERD (ICD-530.81) DEGENERATIVE JOINT DISEASE (ICD-715.90) HEMORRHOIDS (ICD-455.6) CHOLELITHIASIS (ICD-574.20) SLEEP APNEA (ICD-780.57) ARTHRITIS (ICD-716.90) HYPOTHYROIDISM (ICD-244.9)  Past Surgical History: Last updated: 09/05/2008 Cholecystectomy hemorrhoidectomy tubal ligation C-section urologic surgery for ureteropelvic junction obstruction. hysterectomy  Family History: Last updated: 07/04/2007 (+) GS Family History of Breast Cancer: Family History of Diabetes:  Family History of Heart Disease:  Family History of Colon Cancer:father age 47  Social History: Last updated: 09/05/2008 HSG, some community college Married  2 daughters - one daughter living at home. Has Bipolar dz. Has had behavior issues. A stressor. 1 granddaughter living with her. Occupation: Engineer, manufacturing  married with 2 daughters  Review of Systems  The patient denies anorexia, fever, weight loss, chest pain, syncope, dyspnea on exertion, prolonged cough, abdominal pain, muscle weakness, difficulty walking, and enlarged lymph nodes.    Physical Exam  General:  Well-developed,well-nourished,in no acute distress; alert,appropriate and cooperative throughout examination Head:  normocephalic and atraumatic.   Eyes:  pupils equal and pupils round.  C&S clear Neck:  supple and full ROM.   Chest Wall:  very tender with palpable knot of muscle at the medial aspect of the right scapula. Moderate tenderness to palpation of the anterior chest wall Lungs:  normal respiratory effort and normal breath sounds.   Heart:  normal rate and regular rhythm.   Msk:  normal ROM,  no joint swelling, and no joint warmth.   Pulses:  2+ radial Neurologic:  alert & oriented X3 and strength normal in all extremities.   Skin:  multiple tatoos on her back. Psych:  Oriented X3, memory intact for recent and remote, normally interactive, and good eye contact.     Impression & Recommendations:  Problem # 1:  BACK  PAIN, ACUTE (ICD-724.5)  Back pain with identified trigger point right scapula at medial aspect.  Plan - trigger point injection.           massage and lineament  Her updated medication list for this problem includes:    Hydrocodone-acetaminophen 5-325 Mg Tabs (Hydrocodone-acetaminophen) ..... One tablet every 6 hours as needed    Meloxicam 15 Mg Tabs (Meloxicam) .Marland Kitchen... 1 by mouth once daily  Orders: Trigger Point Injection Single Tendon Origin/Insertion [id number]) Depo- Medrol 40mg  (J1030)  Complete Medication List: 1)  Allegra 180 Mg Tabs (Fexofenadine hcl) .... Take 1 tablet by mouth once a day as needed 2)  Alprazolam 1 Mg Tabs (Alprazolam) .Marland Kitchen.. 1 two times a day 3)  Synthroid 50 Mcg Tabs (Levothyroxine sodium) .... Take 1 tablet by mouth once a day 4)  Pantoprazole Sodium 40 Mg Tbec (Pantoprazole sodium) .Marland Kitchen.. 1 by mouth q am 5)  Hydrocodone-acetaminophen 5-325 Mg Tabs (Hydrocodone-acetaminophen) .... One tablet every 6 hours as needed 6)  Meloxicam 15 Mg Tabs (Meloxicam) .Marland Kitchen.. 1 by mouth once daily 7)  Mupirocin 2 % Oint (Mupirocin) .... Apply to sore in right nose two times a day. 8)  Vitamin D 1000 Unit Tabs (Cholecalciferol) .Marland Kitchen.. 1 tablet daily 9)  Prednisone 10 Mg Tabs (Prednisone) .... 3 once daily x 1, 2 once daily x 3, 1 once daily x 6 Prescriptions: PREDNISONE 10 MG TABS (PREDNISONE) 3 once daily x 1, 2 once daily x 3, 1 once daily x 6  #15 x 0   Entered and Authorized by:   Jacques Navy MD   Signed by:   Jacques Navy MD on 12/05/2009   Method used:   [facility]ronically to        [facility] [address] [phone][facility] (retail)       [address]       [place], [place]  [address]       Ph: [phone]       Fax: [phone]   RxID:   [id number]    Procedure Note  Injections: The patient complains of pain and tenderness. Indication: acute pain  Procedure # 1: trigger point injection    Location: point of maximum tenderness and spasm right  scapular region    Technique: 23g 1" needle    Medication: 40 mg depomedrol    Anesthesia: 2% xylocain    Comment: verbal consent obtained. No complications encountered. Rapid relief of pain  Cleaned and prepped with: betadine Wound dressing: bandaid

## 2010-03-25 NOTE — Progress Notes (Signed)
  Phone Note Refill Request Message from:  Fax from Pharmacy on August 20, 2009 3:36 PM  Refills Requested: Medication #1:  ALLEGRA 180 MG TABS Take 1 tablet by mouth once a day as needed Initial call taken by: Ami Bullins CMA,  August 20, 2009 3:36 PM    Prescriptions: ALLEGRA 180 MG TABS (FEXOFENADINE HCL) Take 1 tablet by mouth once a day as needed  #30 x 12   Entered by:   Ami Bullins CMA   Authorized by:   Jacques Navy MD   Signed by:   Bill Salinas CMA on 08/20/2009   Method used:   Electronically to        [facility] [phone]* (retail)       [address]       [place], [place]  [address]       Ph: [phone]       Fax: [phone]   RxID:   [id number]

## 2010-03-25 NOTE — Medication Information (Signed)
Summary: Pro[facility]ix Approved/[facility]  [facility] Approved/[facility]   Imported By: Sherian Rein 07/26/2009 10:15:42  _____________________________________________________________________  External Attachment:    Type:   Image     Comment:   External Document

## 2010-03-26 ENCOUNTER — Encounter: Payer: Self-pay | Admitting: Internal Medicine

## 2010-04-09 ENCOUNTER — Telehealth: Payer: Self-pay | Admitting: Internal Medicine

## 2010-04-16 NOTE — Progress Notes (Signed)
Summary: Refill Request  Phone Note Refill Request Message from:  Fax from Pharmacy  Refills Requested: Medication #1:  ALPRAZOLAM 1 MG  TABS 1 two times a day   Supply Requested: 1 month   Last Refilled: 03/09/2010 [facility]ill  Initial call taken by: Burnard Leigh [facility]),  April 09, 2010 4:19 PM  Follow-up for Phone Call        ok fo rrefill x 3 Follow-up by: Jacques Navy MD,  April 10, 2010 1:12 PM    Prescriptions: ALPRAZOLAM 1 MG  TABS (ALPRAZOLAM) 1 two times a day  #60 x 3   Entered by:   Rock Nephew CMA   Authorized by:   Jacques Navy MD   Signed by:   Rock Nephew CMA on 04/10/2010   Method used:   Telephoned to ...       [facility] [address] [facility]       [address]       [place], [place]  [address]       Ph: [phone]       Fax: [phone]   RxID:   [id number]

## 2010-06-04 LAB — POCT HEMOGLOBIN-HEMACUE: Hemoglobin: 13.7 g/dL (ref 12.0–15.0)

## 2010-06-16 ENCOUNTER — Telehealth: Payer: Self-pay | Admitting: *Deleted

## 2010-06-16 MED ORDER — LEVOTHYROXINE SODIUM 50 MCG PO TABS: 50.0000 ug | ORAL_TABLET | Freq: Every day | ORAL | Status: DC

## 2010-06-16 NOTE — Telephone Encounter (Signed)
Patient requesting RF of synthroid 50 mcg 1 qd - Needs today, unsure what pharm

## 2010-06-16 NOTE — Telephone Encounter (Signed)
Done, pt aware.

## 2010-07-08 NOTE — Procedures (Signed)
[name], [name] NO.:  [id number]   MEDICAL RECORD NO.:  [id number]          PATIENT TYPE:  REC   LOCATION:  [facility]                          FACILITY:  [facility]   PHYSICIAN:  Celene Kras, MD        DATE OF BIRTH:  [birth year]   DATE OF PROCEDURE:  DATE OF DISCHARGE:                               OPERATIVE REPORT   [name].  I evaluated  her and reviewed the Health and History form and 14-point review of  systems.  She had a fair to equivocal response to the block.  She states  some response to pain, and I think I am going to go a little bit lower  to T11-12.  She had some post-procedural discomfort, and I reassure her,  I think we will go a little lower and probably get around this.  We will  consider a peripheral approach, but she has some bilateral presentation,  extensive myofascial positioning and I think this is probably our best  approach.   Objectively, no real significant change, diffuse paralumbar myofascial  discomfort, pain with extension and side bending.   IMPRESSION:  Spondylosis.  Degenerative spine disease, lumbar spine with  peripheral neuropathy, unspecified.   PLAN:  1. Conservative management.  Thoracic epidural, followed by      reassessment.  Consider addressing peripherally, via nerve root      injection if indicated at a later date.  2. Modifiable features in health profile.  Cigarette cessation is      mandatory for best outcome.  Conditioning also discussed.      Questions were answered and discussed in lay terms.   Objectively, some diffuse paralumbar myofascial discomfort.  Fortin test  positive, side bending.  Parathoracic discomfort is noted.  Peri-  incisional discomfort, nothing new neurologically.   IMPRESSION:  Degenerative spine disease, thoracic spine peripheral  neuropathy, unspecified.   PLAN:  Thoracic epidural.  She is consented.   The patient was taken to the  fluoroscopy suite and placed in the prone  position.  The back prepped and draped in the usual fashion using a  Hustead needle, advanced into the T11-12 interspace without any evidence  of CSF, heme, or paresthesia.  Test block uneventfully followed 40 mg of  Aristocort, 2 mL lidocaine, 1% MPF at each level are injected to the  epidural space.  Tolerated the procedure well.  No complications from  our procedure.  Appropriate recovery.           ______________________________  Celene Kras, MD    HH/MEDQ  D:  05/15/2008 11:29:47  T:  05/16/2008 00:56:23  Job:  [id number]

## 2010-07-08 NOTE — Procedures (Signed)
[name], BAYS NO.:  [id number]   MEDICAL RECORD NO.:  [id number]           PATIENT TYPE:   LOCATION:                                 FACILITY:   PHYSICIAN:  [name], M.D.DATE OF BIRTH:  10-20-1960   DATE OF PROCEDURE:  DATE OF DISCHARGE:                               OPERATIVE REPORT   PROCEDURE:  Right L1 transforaminal lumbar epidural steroid injection  under fluoroscopic guidance.   INDICATIONS:  Post-thoracotomy pain syndrome.  He has pain along the  12th rib but also going into the inguinal area.  Pain is only partially  responsive to Neurontin.   Pain is rated as 7/10 and interferes with mobility and quality of life.   PROCEDURE NOTE:  Informed consent was obtained after describing the  risks and benefits of the procedure.  These include bleeding, bruising,  infection, and temporary or permanent paralysis.  She elects to proceed  and has given written consent.  The patient was placed prone on  fluoroscopy table.  Betadine prep, sterile drape.  A 25-gauge 1-1/2-inch  needle was used to anesthetize the skin and subcu tissue with lidocaine  1% x3 mL.  Then a 22-gauge 3-1/2-inch spinal needle was inserted under  fluoroscopic guidance starting at L1-L2 intervertebral foramen.  AP,  lateral, and oblique imaging utilized.  Omnipaque 180 under live  fluoroscopy demonstrated good epidural spread followed by injection of 1  mL of 10 mg/mL dexamethasone and 2.5 mL of 1% MPF lidocaine.  The  patient tolerated the procedure well.  Pre- and post-injection vitals  were stable.  Post-injection instructions were given.      [name], M.D.  Electronically Signed     AEK/MEDQ  D:  09/05/2007 16:45:28  T:  09/06/2007 03:43:25  Job:  [id number]   cc:   Bertram Millard. Dahlstedt, M.D.  Fax: [phone]

## 2010-07-08 NOTE — Assessment & Plan Note (Signed)
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  On further questioning,  she also has pain going into the inguinal area.  She has no lower  extremity weakness and no spasticity.  She has been trialed on a low-  dose Neurontin, but still has quite a bit of these left.  She is up to  100 q.i.d.  She has had no drowsiness associated with this.   She has had no thoracic imaging studies.   Her sleep is better since being on the medication.  Pain is with  walking, sitting, and standing.  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   PHYSICAL EXAMINATION:  Blood pressure 140/84, pulse 93, respirations 18,  and O2 saturations 100% on room air.  She has tenderness on the lower  rib area on the right side.  She does have scar.  No evidence of  herniation over the incision.  Mild hypersensitivity to touch over that  area.   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  She has had partial resection in 12th rib.   PLAN:  1. She has had no significant effect from Lidoderm, but has had some      positive effect with Neurontin, which she credits for her improved      sleep.  I think that we can increase that dose to 200 mg q.i.d. to      see if it can impact on day time symptomatology as well.  2. We discussed the pros and cons of more aggressive treatment      including nerve blocks.  She would like to pursue this option and I      have ordered a thoracic MRI to see if she has any concomitant      thoracic stenosis, lower thoracic disk, particularly on the right      side than the lower thoracic area.   I will see her back for the injection and will need to call in some  Valium for her too.      Erick Colace, M.D.  Electronically Signed     AEK/MedQ  D:  08/08/2007 15:48:38  T:  08/09/2007 12:56:13  Job #:  [id number]   cc:   Bertram Millard.  Dahlstedt, M.D.  Fax: [phone]

## 2010-07-08 NOTE — Assessment & Plan Note (Signed)
This is a 50 year old female.  I last saw her on February 13, 2008.  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  Responded well at least temporarily  to L1 transforaminal injection x2.  She has good relief with the  neuropathic pain at night with Lyrica, but does not tolerate during the  day, and even if she takes higher dose of Lyrica during night, she wakes  up groggy the next morning.  She has tried Lidoderm patch, which was not  helpful.   PAST MEDICAL HISTORY:  Significant for hypothyroidism as well as  arthritis.   SOCIAL HISTORY:  Married, works 40 hours a week.   Her pain level is averaging about 3/10, but currently 7/10 having a bad  day.  Pain interferes with activity at 6/10 level.  Pain is worse with  walking, bending, sitting, standing, and improves with medication  relief.  Her meds are fair.  She has been on maximum dose of tramadol  400 mg a day, really not any more helpful with 300, has tried Darvocet  in the past as well.   PAST HISTORY:  As noted above.  Also, has had stomach problems in the  past.   FAMILY HISTORY:  Married, lives with her husband and a family.   PHYSICAL EXAMINATION:  VITAL SIGNS:  Blood pressure 145/75, pulse is 96,  respirations 20, and O2 sat 99% room air.  GENERAL:  Well-developed and well-nourished female in no acute stress.  Orientation x3.  Affect is alert.  Gait is normal.  EXTREMITIES:  She has normal strength bilaterally.  Lower extremity is  normal.  Deep tendon reflex at bilateral lower extremity.  She has a scar at right flank related to prior nephrectomy.  She has  minor tenderness to palpation over that area.   IMPRESSION:  Chronic postoperative pain.  Majority of her pain appears  to be neuropathic.  She has had problems with sedation with an atypical  anticonvulsant.  Only once she really has not tried Topamax, but this  does have similar side  effects.   She would like to look at the interventional procedures into that  effect, I will send her to Dr. Stevphen Rochester, question of radiofrequency  ablation, intercostal nerve versus perhaps a dorsal nerve root  radiofrequency, versus a spinal cord stem.  She is less inclined to do  spinal cord stimulation, however.   I will see her back in about a month.  We will start trial of  hydrocodone twice a day 5/500 and consider Nucynta.      Erick Colace, M.D.  Electronically Signed     AEK/MedQ  D:  04/10/2008 18:08:17  T:  04/11/2008 02:35:47  Job #:  [id number]   cc:   Celene Kras, MD  Fax: [phone]   Bertram Millard. Dahlstedt, M.D.  Fax: [phone]

## 2010-07-08 NOTE — Procedures (Signed)
NAMEMIESHA, BACHMANN NO.:  [id number]   MEDICAL RECORD NO.:  [id number]           PATIENT TYPE:   LOCATION:                                 FACILITY:   PHYSICIAN:  Celene Kras, MD        DATE OF BIRTH:  1961-02-14   DATE OF PROCEDURE:  DATE OF DISCHARGE:                               OPERATIVE REPORT   [name].  I  evaluated her and reviewed the health and history form and 14-point  review of systems.  1. Benefiting from a thoracic epidural, pain is a bit lower today, we      will emphasize between our entry points, we will follow the      described pain pattern, T8.  2. Modifiable features and health profile, cigarette cessation, other      modifiable features reviewed.   No interval change.  Do not plan another intervention if she does break  through.  She might benefit from facet intervention, possible RF but see  how she does with this block.  Followup in the office for consultation.  She is consented for today's procedure.  Consider muscle stimulator for  reeducation.  She finds Celebrex beneficial, but unfortunately her  insurance company will not cover this, recommend OTC Motrin.   Objectively, diffuse parathoracic myofascial, Wharton test by side  bending, range of motion impaired secondary to pain, pain on extension.  Nothing new neurologically.   IMPRESSION:  Degenerative spine disease, thoracic spine.   PLAN:  Thoracic epidural, she is consented.   The patient was taken to fluoroscopy suite and placed in the prone  position.  The back prepped and draped in usual fashion using a Hustead  needle advanced into the T7-8 interspace without any evidence of CSF,  heme, or paresthesia.  Test block uneventfully followed 40 mg of  Aristocort and flush needle.   Tolerated procedure well.  No complications from our procedure.  Appropriate recovery.  Discharge instructions given.     ______________________________  Celene Kras, MD     HH/MEDQ  D:  06/12/2008 09:47:16  T:  06/12/2008 23:36:52  Job:  [id number]

## 2010-07-08 NOTE — Assessment & Plan Note (Signed)
DATE OF LAST VISIT:  January 02, 2008.   The patient is a 50 year old female who has a history of pain related to  nephrectomy, had a postthoracotomy pain T12 area.  She responded well to  L1 transforaminal injections x2, but only temporarily.  She has had good  relief at night with Lyrica with doses as low as 25 mg, but does not  tolerate during the day.  She has tried Neurontin, which really has not  helped her much.  She has not tried any tramadol.  She has tried  hydrocodone in the past.  Her average pain is 7/10, described as sharp  and tingling.  She has tried Lidoderm patch, which was not very helpful.  Her sleep is fair.  Pain increases with walking, sitting, and standing;  improves with pacing her activities and medications.  She walks without  assistance.  She is able to climb steps.  She works 40 hours a week.  She needs assist with household duties and shopping related to this  pain, has some numbness and tingling around the right side.   PAST MEDICAL HISTORY:  Significant for hypothyroidism.   PHYSICAL EXAMINATION:  VITAL SIGNS:  Her blood pressure is 127/77, pulse  78, respiratory rate 18, and O2 sat is 100% on room air.  GENERAL:  A well-developed, well-nourished female in no acute distress.  Orientation x3.  Affect is alert.  Gait is normal.  MUSCULOSKELETAL:  She has some hypersensitivity to touch over the right  flank incision.  She has no pain with forward flexion, extension,  lateral rotation, or bending at the lumbar area.   She has full strength bilateral extremities and normal deep tendon  reflexes.  Her gait is normal.   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  She has had temporary relief from nerve blocks.  Discussed  other treatments that may be longer lasting, one would be finding an  optimal pain medication regimen, so far does well at night with the  Lyrica at 25 mg.  We will trial her with tramadol during the day 50  mg  t.i.d.   I will discuss with Dr. Stevphen Rochester other options in terms of more invasive  things like dorsal root radiofrequency neurotomy to see if she is a  candidate.  Other potential treatments would be peripheral nerve  stimulation.      Erick Colace, M.D.  Electronically Signed     AEK/MedQ  D:  02/13/2008 13:21:17  T:  02/14/2008 03:14:29  Job #:  [id number]   cc:   Celene Kras, MD  Fax: [phone]   Bertram Millard. Dahlstedt, M.D.  Fax: [phone]

## 2010-07-08 NOTE — Op Note (Signed)
[name], KLINGEL              ACCOUNT NO.:  [id number]   MEDICAL RECORD NO.:  [id number]          PATIENT TYPE:  AMB   LOCATION:  [facility]                         FACILITY:  [facility]   PHYSICIAN:  Marlowe Kays, M.D.  DATE OF BIRTH:  1961/02/02   DATE OF PROCEDURE:  06/21/2008  DATE OF DISCHARGE:                               OPERATIVE REPORT   PREOPERATIVE DIAGNOSIS:  Symptomatic ganglion, dorsum of left foot.   POSTOPERATIVE DIAGNOSIS:  Symptomatic ganglion, dorsum of left foot.   OPERATION:  Excision of ganglion, dorsum of left foot.   SURGEON:  Marlowe Kays, M.D.   ASSISTANT:  Nurse.   ANESTHESIA:  General.   PATHOLOGY AND INDICATIONS FOR PROCEDURE:  The ganglion has been present  for a number of years but recently increased in size and is becoming  more painful.  See operative description below for additional details.   PROCEDURE:  Under satisfactory general anesthesia, pneumatic tourniquet  applied and the left leg Esmarched out nonsterilely and the tourniquet  inflated to 300 mmHg.  Left foot and ankle were prepped with DuraPrep  and draped in a sterile field.  Time-out performed.  I made roughly a 3-  cm vertical incision overlying the cystic structure which I dissected  out between the extensor tendons.  It had a large component deep and I  followed down to what appeared to be the tarsometatarsal joint probably  #4.  I excised the base of the cyst at this level.  It was clearly  cystic and I did not send it to pathology.  There was not much soft  tissue at this point, but I did close what was available with  interrupted 3-0 Vicryl.  This did seem to close the aperture.  The wound  was irrigated well with sterile saline and the soft tissues infiltrated  with 0.5% plain Marcaine, and I then continued to close her with some 3-  0 Vicryl in the subcutaneous tissue and interrupted 4-0 nylon mattress  sutures in the skin.  Betadine, Adaptic and dry sterile dressing  were  applied.  Tourniquet was released.  She tolerated the procedure well and  was taken to the recovery room in satisfactory condition with no known  complications.           ______________________________  Marlowe Kays, M.D.     JA/MEDQ  D:  06/21/2008  T:  06/21/2008  Job:  [id number]
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
## 2010-07-08 NOTE — Assessment & Plan Note (Signed)
Ms. Tino returns today.  She had a right L1 transforaminal lumbar  epidural steroid injection under fluoroscopic guidance, performed on  September 05, 2007, status post thoracotomy pain syndrome.  Pain along the  12th rib going into the inguinal area and into the proximal thigh.  The  proximal thigh pain has improved.  She had that at least as a prolonged  defect of the L1 transforaminal.  She did have very good relief of her  pain for about a week, but then it has gradually come back.  In the  interval time, she has been scheduled for vaginal hysterectomy in  September 2009.   SOCIAL HISTORY:  She continues to work 40 plus hours a week.  She has  some difficulty with certain household duties.  She is married and lives  with her husband.   Pain is described as constant, but actually worse at the time of her  menstrual cycle.   PLAN:  1. I will see her back for a repeat injection.  We will plan this      after her surgery, give her some time to recover that way, and will      go about 3 months.  2. Supply of Lyrica sample 75 b.i.d.  3. Valium prior to injection.      Erick Colace, M.D.  Electronically Signed     AEK/MedQ  D:  10/03/2007 17:50:40  T:  10/04/2007 09:35:26  Job #:  [id number]

## 2010-07-08 NOTE — Op Note (Signed)
NAMEJUSTYN, BOYSON              ACCOUNT NO.:  [id number]   MEDICAL RECORD NO.:  [id number]          PATIENT TYPE:  OIB   LOCATION:  9306                          FACILITY:  [facility]   PHYSICIAN:  Sherron Monday, MD        DATE OF BIRTH:  02-09-1961   DATE OF PROCEDURE:  11/07/2007  DATE OF DISCHARGE:                               OPERATIVE REPORT   PREOPERATIVE DIAGNOSES:  Menorrhagia, abnormal uterine bleeding.   POSTOPERATIVE DIAGNOSES:  Menorrhagia, abnormal uterine bleeding.   PROCEDURES:  Total vaginal hysterectomy, cystoscopy.   SURGEON:  Sherron Monday, MD   ASSISTANT:  Huel Cote, MD   ANESTHESIA:  General endotracheal.   COMPLICATIONS:  None.   PATHOLOGY:  Uterus and cervix.   ESTIMATED BLOOD LOSS:  100 mL.   URINE OUTPUT:  150 mL of clear urine at the end of the procedure.   FLUIDS:  1300 mL.   FINDINGS:  Normal sized uterus and normal tubes and ovaries.   PROCEDURE:  After informed consent was reviewed with the patient  including the risks, benefits, and alternatives of the surgical  procedure, she was transported to the operating room and placed on the  table in supine position.  General anesthesia was induced and found to  be adequate.  She was then placed in the Y[place]in stirrups, prepped and  draped in the normal sterile fashion.  Using a heavy weighted speculum  and a Sims retractor, her cervix was grasped with Christella Hartigan tenaculum.  It  was then injected circumferentially with vasopressin.  The bladder was  dissected off anteriorly and an attempt to enter the anterior cul-de-sac  was made, however, this was unsuccessful.  Posteriorly we entered the  posterior cul-de-sac without difficulty.  A single tie of figure-of-  eight was placed midline in the posterior cuff.  Heaney clamp was placed  in the uterosacral ligaments on either side.  They were then transected  and suture ligated with 0 Vicryl.  Hemostasis was assured and these were  held.  The cardinal  ligaments were clamped on both sides, transected,  and suture ligated in a similar fashion.  The uterine arteries and broad  ligaments were serially clamped with Heaney clamps, transected, and  suture ligated on both sides.  Hemostasis was visualized.  Both cornua  were clamped with Heaney clamps, transected, and the uterus was  delivered.  The pedicles were then suture ligated with good hemostasis.  The pedicles were inspected and areas that were not hemostatic were made  hemostatic with Bovie cautery and figure-of-eight sutures of 0 Vicryl.  The uterosacral ligaments are approximated and the held  stitches were tied together.  The cuff was closed with running locked  suture of 2-0 Vicryl.  Hemostasis was assured.  The cystoscopy was  performed after insufflation of indigo carmine and both ureters revealed  jets of blue dye.  The patient tolerated the procedure well.  Sponge,  lap, and needle counts were correct x2 at the end of the procedure.      Sherron Monday, MD  Electronically Signed     JB/MEDQ  D:  11/07/2007  T:  11/08/2007  Job:  [id number]

## 2010-07-08 NOTE — Procedures (Signed)
[name], Hogan NO.:  [id number]   MEDICAL RECORD NO.:  [id number]          PATIENT TYPE:  REC   LOCATION:  [facility]                          FACILITY:  [facility]   PHYSICIAN:  Celene Kras, MD        DATE OF BIRTH:  1960/08/02   DATE OF PROCEDURE:  DATE OF DISCHARGE:                               OPERATIVE REPORT   [name]  I  evaluated her and reviewed the health and history form and 14-point  review of systems.   1. She is complaining of a generalized thoracic component, right-sided      lateralizing pain, secondary to rib resection, diffuse spondylytic      and degenerative complaints.  We considered going on to      transforaminal intervention, particularly T12.  I think, it is      reasonable to at least start interlaminar, and then possible      intercostal nerve block, or T12 nerve root intervention with      consideration thus to pulsed RF with positive predictive      experience.  I reviewed this with her.  Risks, complications, and      options were fully outlined.  2. Again modifiable features in health profile discussed such as      cigarette cessation.  3. She is complaining of pain that she is having difficulty with      setting and her normal activities.  I am going to go ahead and      follow her expectantly, and I reviewed this with her, I have used      models.   Objectively, diffuse parathoracic myofascial discomfort, particularly  right greater than the left.  Well-healed incision.  Pain with extension  and side bending.  Nothing new neurologically.   IMPRESSION:  Spondylosis, thoracic spine with rib resection, peripheral  neuropathy unspecified.   PLAN:  As outlined above.   Discharge instructions given.  She has consented.   The patient was taken to the fluoroscopy suite and placed in prone  position.  Back was prepped and draped in usual fashion using a Hustead  needle advanced to  the T11-T12 interspace without any evidence of CSF,  heme, or paresthesia.  Test block uneventfully.  This is by loss-of-  resistance.  Followed with 40 mg of Aristocort and 2 mL of preservative-  free  lidocaine 1%.   She tolerated the procedure well.  She is counseled  in context of  activities of daily living.  Discharge instructions given.           ______________________________  Celene Kras, MD     HH/MEDQ  D:  04/17/2008 11:27:24  T:  04/18/2008 01:30:22  Job:  [id number]

## 2010-07-08 NOTE — H&P (Signed)
NAMECORALEE, [name]              ACCOUNT NO.:  [id number]   MEDICAL RECORD NO.:  [id number]         PATIENT TYPE:  WAMB   LOCATION:                                FACILITY:  [facility]   PHYSICIAN:  Sherron Monday, MD        DATE OF BIRTH:  February 02, 1961   DATE OF ADMISSION:  11/07/2007  DATE OF DISCHARGE:                              HISTORY & PHYSICAL   PLANNED PROCEDURE:  Total vaginal hysterectomy with a cystoscopy.   HISTORY OF PRESENT ILLNESS:  A 49 year old, G2, P2, who presents with  complaints of menorrhagia and irregular bleeding who desires definitive  management.  She has had a trial of a Mirena IUD and did not like that.  She has been on progesterone-only pills for hormonal suppression as she  is a smoker and older than 35.  We discussed with the patient the  options including the Mirena IUD, the NovaSure, and the hysterectomy.  The patient desires a hysterectomy and would like to maintain her  ovaries for hormonal support.   PAST MEDICAL HISTORY:  Significant for:  1. Thyroid disease.  2. Kidney obstruction.  3. Osteoarthritis.   PAST SURGICAL HISTORY:  Significant for:  1. Kidney surgery.  2. C section.  3. Broken arm.  4. Tubal ligation.   PAST OBSTETRICAL/GYNECOLOGICAL HISTORY:  She is a G2, P2-0-0-2, with a  term vaginal delivery of a 5-pound 14-ounce female infant and a term  cesarean section of a 9-pound 13-ounce infant 5 years later.  She has no  history of any abnormal Pap smears.  Her last was performed in February  of 2009 and was within normal limits.  No history of any sexually  transmitted diseases.  She is sexually active and monogamous.  She  states her cycles are regular every month.  She is using tubal ligation  for contraception and has had trials of the Mirena and progesterone-only  pills.  She occasionally complains of dryness with intercourse, but  generally no dyspareunia.  She does have occasional intermenstrual  bleeding.  No postcoital  bleeding.   MEDICATIONS:  Include Xanax, ranitidine, Synthroid, Lyerica and NSAIDs.   ALLERGIES:  No known drug allergies.   SOCIAL HISTORY:  Half pack a day smoker.  Occasional alcohol use.  No  drug use.  She is married.   FAMILY HISTORY:  Significant for diabetes, hypertension, coronary artery  disease in her father as well as colonic polyps in her father.  No  breast, colon or ovarian cancer in the family.   PHYSICAL EXAMINATION:  In general, she is in no apparent distress.  CARDIOVASCULAR:  Regular rate and rhythm.  LUNGS:  Clear to auscultation bilaterally.  ABDOMEN:  Soft, nontender, nondistended with good bowel sounds.  EXTREMITIES:  Symmetric and nontender.  NECK:  Without lymphadenopathy.  HEENT:  Mucous membranes are moist.  Thyroid within normal limits.  BREASTS:  C cup.  No masses, nontender.  No distortion with a recent  normal mammogram per the patient.  BACK:  No costovertebral angle tenderness.  PELVIC EXAM:  Normal external female genitalia; normal Bartholin's,  urethral and Skene glands; good support of the cervix and vagina without  lesions.  No cervical motion tenderness.  Normal uterus and adnexa, no  masses and nontender.   ASSESSMENT/PLAN:  Discussed with the patient risks, benefits and  alternatives to surgical management.  The patient desires definitive  treatment with a total vaginal hysterectomy, discussed the risks  including bleeding, infection, damage to the surrounding organs, wishes  to proceed.  Surgery will be scheduled for September 14.  She voices  understanding of the risks and wishes to proceed.      Sherron Monday, MD  Electronically Signed     JB/MEDQ  D:  11/04/2007  T:  11/04/2007  Job:  [id number]

## 2010-07-08 NOTE — Assessment & Plan Note (Signed)
A 50 year old female that I last saw February 13, 2008.  She has a  history of pain related to nephrectomy, chronic postthoracotomy pain  syndrome, partial T12 rib removal.  She responded well at least  temporarily to L1 transforaminal injections x2.  She has had good relief  of her neuropathic pain at night with Lyrica, but does not tolerate  during the day.  She has trialed some Neurontin in the past which really  did not help her.  She has tried tramadol recently 1 tablet 3 times a  day and only has minimal pain relief.  She has tried Darvocet in the  past which did not help with her pain.  She has tried Lidoderm patch  which was not helpful.  She does continue to work 40 hours a week.  She  needs assistance with household duties and shopping.   PAST MEDICAL HISTORY:  Significant for hypothyroidism and arthritis.   SOCIAL HISTORY:  She is married and she works 40 hours a week.   PHYSICAL EXAMINATION:  VITAL SIGNS:  Blood pressure 126/81, pulse 85,  respiration 18, O2 sat 100% on room air.  GENERAL:  Well-developed, well-nourished female in no acute distress.  Orientation x3.  Affect is alert.  Gait is normal.  EXTREMITIES:  Without edema.  She has tenderness around the incision  site from right nephrectomy.  She has no pain along the lumbar  paraspinal muscle.  She has normal spine range of motion.  There is  normal lower extremity strength, range of motion as well as deep tendon  reflexes.  The gait is normal.   IMPRESSION:  Chronic postoperative pain, postthoracotomy pain syndrome.  I believe the majority of pain is neuropathic.  She does respond well to  Lyrica, but unfortunately does not tolerate this during the day.  Other  p.o. med options include Topamax, but this could be similar sedating.  Tricyclics can also be utilized in this situation; however, also is  going to be sedating as well.   She safely tolerated hydrocodone in the past, and we discussed the pros  and cons  of using narcotic analgesic including tolerance dependence and  diminishing effect overtime.  For now, we will just first try to  increase her tramadol to 2 p.o. q.i.d.  We will give her and if it  helps, we will continue this.  If it does not, we could start low dose  of hydrocodone possibly half tablet t.i.d.   Discussed other interventional pain treatment options including  radiofrequency ablation of the ganglion which I would discuss with Dr.  Stevphen Rochester.  I also gave her a video for spinal cord stimulation and we  discussed the concept of trial.  I discussed her pain and pathology as  well as some of the other interventions using a spine model.  She  understands treatment alternatives and options as well as risk of  treatment.      Erick Colace, M.D.  Electronically Signed     AEK/MedQ  D:  03/12/2008 16:05:07  T:  03/13/2008 05:40:31  Job #:  [id number]   cc:   Celene Kras, MD  Fax: [phone]

## 2010-07-08 NOTE — Procedures (Signed)
[name], [name]              ACCOUNT NO.:  [id number]   MEDICAL RECORD NO.:  [id number]          PATIENT TYPE:  REC   LOCATION:  [facility]                          FACILITY:  [facility]   PHYSICIAN:  Erick Colace, M.D.DATE OF BIRTH:  December 10, 1960   DATE OF PROCEDURE:  DATE OF DISCHARGE:                               OPERATIVE REPORT   This is a L1-2 transforaminal lumbar epidural steroid injection under  fluoroscopic guidance.   INDICATIONS:  L1 radiculitis with pain previously improved after L1  block.  Pain is only partially response to medication management  including atypical anticonvulsants and narcotic analgesics.   Oswestry disability index 42%.   Informed consent was obtained after describing risks and benefits of the  procedure with the patient include bleeding, bruising, and infection.  She elects to proceed and has given written consent.  The patient placed  prone on the fluoroscopy table.  Betadine prep, sterile drape, a 25-  gauge 1-1/2-inch needle was used to anesthetize the skin and subcu  tissue with 1% lidocaine x2 mL.  Then, a 22-gauge 3-1/2-spinal needle  was inserted under fluoroscopic guidance.  First targeting the L1-2  intervertebral foramen, AP, lateral, and oblique imaging was utilized.  Omnipaque 180 x 1.5 mL demonstrated good epidural spread followed by  injection of 1 mL of 10 mg/mL dexamethasone and 2 mL of 1% MPF  lidocaine.  The patient tolerated the procedure well.  Pre- and post-  injection vitals stable.  Post injection instructions.      Erick Colace, M.D.  Electronically Signed     AEK/MEDQ  D:  01/02/2008 48:54:62  T:  01/02/2008 70:35:00  Job:  [id number]

## 2010-07-11 NOTE — Op Note (Signed)
NAME:  FARRA, NIKOLIC                  ACCOUNT NO.:  [id number]   MEDICAL RECORD NO.:  [id number]                   PATIENT TYPE:  AMB   LOCATION:  [facility]                                 FACILITY:  [facility]   PHYSICIAN:  Jamison Neighbor, M.D.               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   POSTOPERATIVE DIAGNOSIS:  Right ureteropelvic junction obstruction.   PROCEDURE:  1. Cystoscopy.  2. Bilateral retrograde.  3. Right double-J catheter insertion.   SURGEON:  Jamison Neighbor, M.D.   ANESTHESIA:  General.   COMPLICATIONS:  None.   DRAINS:  A 6 French x 26 cm double-J catheter.   BRIEF HISTORY:  This 50 year old female has had problems with pain on the  right-hand side of her abdomen with some pain radiating back towards the  flank.  The patient has undergone a cholecystectomy with placement of a  common bile duct stent.  The patient is still having problems with pain on  that side, and an ultrasound suggested an abnormality of the right kidney.  The patient underwent an IVP which showed good function in the kidney, but  there was never any flow down the ureter, and this was felt to be consistent  with a UPJ obstruction.  The patient is now to undergo retrograde studies to  confirm the diagnosis and placement of a stent to see if her pain is  alleviated.  If the patient's pain is better, she will be given the  opportunity of having either an endopyelotomy or a formal dismembered  pyeloplasty but will not be offered corrective surgery for the UPJ unless it  makes her pain go away.  The patient understands the risks and benefits of  the procedure.  She is aware of the fact that the stent will have some side  effects including microscopic hematuria, urgency, and frequency, as well as  discomfort in the right kidney with voiding, and she also  realizes that  more definitive therapy will be necessary if she does turn out to have a UPJ  obstruction.  She gave full informed consent.   DESCRIPTION OF PROCEDURE:  After successful induction of general anesthesia,  the patient was placed in the dorsal lithotomy position, prepped with  Betadine and draped in the usual sterile fashion.  Careful bimanual  examination was unremarkable with no cystocele, rectocele, or enterocele to  speak of, and no masses on bimanual exam.  The cystoscope was inserted.  The  left ureter was identified.  A retrograde study was performed on that side.  Ureter was of normal caliber.  There was no evidence of UPJ obstruction.  The collecting system was unremarkable with normal caliceal system, no  filling defects or hydronephrosis seen.  On the right-hand side, the ureter  itself was normal, but the patient had what appeared to be a clear-cut UPJ  obstruction with a markedly dilated pelvis as well as dilation of the  collecting system.  A guidewire was passed up into the kidney.  A double-J  catheter was passed over the guidewire and allowed to coil normally within  the renal pelvis as well as within the kidney.  The bladder was drained.  The patient tolerated the procedure well and was taken to the recovery room  in good condition.  She will receive a prescription for Pyridium Plus for  any symptoms associated with the stent as well as a small number of pain  pills and 1 __________ daily.  She will return in follow-up in a week or  two, at which point we will make a decision as to whether she would like to  undergo formal pyeloplasty as a definitive procedure or the minimally  invasive Acucise endopyelotomy.  Should we choose to proceed in that  fashion, she will need a CT angiogram to determine if there is a crossing  vessel which is a contraindication to the endopyelotomy.                                                Jamison Neighbor, M.D.    RJE/MEDQ  D:  07/06/2003  T:  07/06/2003  Job:  [id number]   cc:   Rosalyn Gess. Norins, M.D. [facility]

## 2010-07-11 NOTE — Op Note (Signed)
NAME:  [name], [name]                  ACCOUNT NO.:  [id number]   MEDICAL RECORD NO.:  [id number]                   PATIENT TYPE:  INP   LOCATION:  0362                                 FACILITY:  [facility]   PHYSICIAN:  Jamison Neighbor, M.D.               DATE OF BIRTH:  13-Apr-1960   DATE OF PROCEDURE:  07/19/2003  DATE OF DISCHARGE:                                 OPERATIVE REPORT   PREOPERATIVE DIAGNOSES:  Right ureteropelvic junction obstruction.   POSTOPERATIVE DIAGNOSES:  Right ureteropelvic junction obstruction.   PROCEDURE:  Open right dismembered pyeloplasty.   SURGEON:  Jamison Neighbor, M.D.   FIRST ASSISTANT:  Excell Seltzer. Annabell Howells, M.D.   RESIDENT ASSISTANT:  Rhae Lerner, M.D.   ANESTHESIA:  General endotracheal anesthesia.   COMPLICATIONS:  None.   INDICATIONS FOR PROCEDURE:  Ms. Gilmore is a 50 year old female with a history  of childhood infections and an IVP demonstrating a UPJ obstruction on the  right hand side.  The UPJ obstruction has been noted to be incomplete as  there is good function in the right kidney; however, the patient has a  history of significant right flank pain.  A right double J ureteral stent  was placed previously to determine whether decompression of the right renal  pelvis would improved the patient's flank pain and this was indeed the case.  After explaining in detail the options for surgical management of UPJ  obstruction, the patient has elected to undergo an open dismembered  pyeloplasty.   DESCRIPTION OF PROCEDURE:  The patient was brought to the operating room and  following induction of anesthesia was placed in a right flank position and  prepped and draped in the usual sterile fashion.  A subcostal incision was  subsequently performed just inferior to the right twelfth rib.  The incision  was approximately 10 cm in length.  The dissection was carried down to the  level of the twelfth rib and the distal 4-5 cm of the twelfth  rib cut away  and removed using bone cutters.  A Bookwalter retractor was placed and the  retractor blade set to expose the retroperitoneum.  Once  the transversalis  muscle had been divided, the retroperitoneal space was entered and the right  kidney identified.  Gerota's fascia was subsequently opened and all  perinephric fat dissected away from the underlying kidney until the right  renal pelvis could be identified. An accessory artery was noted to be  crossing over the proximal ureter although this was several centimeters  distal to the right UPJ.  The right ureter was subsequently dissected away  from the surrounding tissues and marked with a vessiloop. The right proximal  ureter was subsequently divided just distal to the right UPJ.  The distal  portion of the right renal pelvis containing the narrowed portion of the  right UPJ was subsequently cut away and removed from the operative field.  The lateral right ureter  was subsequently spatulated.  The ureter was  subsequently brought around the previously noted accessory artery such that  it was a position anterior to the vessel.  A right 4 x 26 double J ureteral  stent was subsequently placed within the ureter and into the right renal  pelvis. The right renal pelvis was subsequently anastomosed to the ureter  using 4-0 Vicryl suture in an interrupted fashion.  Once the anastomosis was  completed, the area was irrigated with antibiotic solution. Gerota's fascia  was subsequently closed with a running 3-0 Vicryl.  A Jackson-Pratt drain  was placed within Gerota's fascia prior to completing the closure. The  Jackson-Pratt drain was subsequently brought out through a separate skin  incision.  All retractors were subsequently removed and the internal and  external oblique fascia closed in two layers using running #1 PDS.  The skin  was subsequently used with skin staples and the JP drain sutured into  position using a 3-0 nylon.  The  patient was subsequently allowed to awaken  and the case was ended.  The patient tolerated the procedure well and a  chest x-ray was ordered in PACU to check for a pneumothorax.  Please note  that Dr. Marcelyn Bruins was present the entire case and participated in all  aspects of the procedure.     Rhae Lerner, M.D.               Jamison Neighbor, M.D.    JJP/MEDQ  D:  07/19/2003  T:  07/19/2003  Job:  [id number]

## 2010-07-11 NOTE — Assessment & Plan Note (Signed)
[facility]                           PRIMARY CARE OFFICE NOTE   [name], [name]               MRN:          [id number]  DATE:06/24/2006                            DOB:          03/14/1960    [name] is a delightful 50 year old woman last seen June 22, 2005.  Please see that complete dictation, which does document past medical  history, family history and social history.  Chart reviewed.   INTERVAL HISTORY:  1. Stress:  The patient reports she has had increasing problems with      too much stress at work.  In addition, a dear and close friend went      through the trauma of liver transplant surgery and then a fatal      outcome.  During this stressful time the patient reports she lost      10 pounds of weight.  She was already taking citalopram but reports      taking it irregularly because it was affecting her sleep, causing      her to have insomnia as well as feeling jittery.  2. MSK:  The patient reports she has had significant problems with her      Relafen in terms of GI distress.  She had done really well with      Celebrex in the past but it is too costly.  3. GI:  The patient has been taking ranitidine on a p.r.n. basis with      good results.  4. Allergy:  The patient continues to have a significant problem with      allergic rhinitis, using Allegra 180 mg.   CURRENT MEDICATIONS:  1. Synthroid 50 mcg daily.  2. Ranitidine 300 mg q.h.s.  3. Nabumetone 1500 mg q.h.s. intermittently.  4. Citalopram 10 mg daily intermittently.  5. Xanax 0.5 mg q.8h. p.r.n. with increased recent use.   REVIEW OF SYSTEMS:  The patient has had a 10-pound weight loss due to  problem #1 above.  No fevers, chills or other constitutional symptoms.  Last eye exam was more than 3 years ago.  No ENT complaints.  She has  rare palpitations and does have a significant caffeine intake with 4  cups of coffee daily and 1 or 2 caffeinated drinks  otherwise.  No  respiratory distress.  GI is well-controlled.  No GU problems.  Musculoskeletal continues to be a problem with diffuse joint pain and  discomfort.   INTERVAL SOCIAL HISTORY:  There have been some reorganizations and  changes at work.  Both of her daughters are currently at home, her older  daughter with a 7-year-old, currently not in school or working.  Her  younger daughter at home, seeking employment.  The patient reports her  marriage is doing well through all of this stress and trauma.   HEALTH MAINTENANCE:  The patient's last pelvic exam at Dr. Ebony Hail  office was February 2008.  The patient is due for a mammogram.  The  patient's last colonoscopy May 30, 2002, with the patient due for a  follow-up in April 2009.   PHYSICAL EXAMINATION:  VITAL SIGNS:  Temperature was 98.3, blood  pressure 121/76, pulse 98, weight 146, down 10 pounds from her last  visit.  GENERAL APPEARANCE:  A well-nourished, well-developed woman in no acute  distress.  HEENT:  Normocephalic, atraumatic.  EACs and TMs were normal.  Oropharynx with native dentition in good repair.  No buccal or palatal  lesions were noted.  Posterior pharynx was clear.  Conjunctivae and  sclerae were clear.  PERRLA, EOMI.  Funduscopic exam with normal disc  margins, no vascular abnormalities noted.  NECK:  Supple without thyromegaly.  NODES:  No adenopathy was noted in the cervical or supraclavicular  regions.  CHEST:  No CVA tenderness.  No deformities were noted.  LUNGS:  Clear with no rales, wheezes or rhonchi.  BREASTS:  Deferred to gynecology.  CARDIOVASCULAR:  2+ radial pulse.  No JVD or carotid bruits.  She had a  quiet precordium with a regular rate and rhythm without murmurs, rubs or  gallops.  ABDOMEN:  Soft, no guarding or rebound.  No organosplenomegaly was  appreciated.  PELVIC, RECTAL:  Exams deferred.  EXTREMITIES:  Without clubbing, cyanosis, edema or deformity, no  synovial thickening, no  joint enlargement or decreased range of motion  was noted.  SKIN:  Clear.  The patient does have a tanning bed tan.   DATA BASE:  Hemoglobin was 11.9 g, white count was 5700 with a normal  differential.  Chemistries with a normal potassium.  Glucose was 108.  Creatinine was 0.8.  Liver functions were normal.  Cholesterol 111,  triglycerides 93, HDL 45.8, LDL was 47.  Thyroid function normal with a  TSH of 0.78.  Urinalysis was negative.   ASSESSMENT AND PLAN:  1. Hypothyroid.  The patient is well-controlled on her present      medications.  She will be switched to levothyroxine for cost-      benefit purposes.  2. Gastrointestinal.  Patient with chronic dyspepsia.  Last upper      endoscopy was May 30, 2002, with esophageal stricture and      gastroesophageal reflux disease.  The patient has done very well on      ranitidine and we will have her continue the same.  3. Musculoskeletal.  Patient with degenerative joint disease that is      continuing to be symptomatic.  The patient found that nabumetone      caused gastric distress.  Plan:  A trial of naproxen 500 mg b.i.d.,      prescription provided.  4. Allergic rhinitis.  I suggested the patient switch to loratadine 10      mg daily for cost-benefit purposes.  She should use a decongestant      over-the-counter as needed for facial pain and discomfort.  5. Psychosocial.  The patient is taken off citalopram because of      intolerability.  She will be given a trial of Zoloft 50 mg daily.      She can continue with Xanax 0.5 mg q.8h. on a p.r.n. basis.  6. Health maintenance.  The patient is currently up to date with her      gynecologist and currently up to date with colorectal cancer      screening.  I have encouraged the patient to stop smoking.  The      patient did have a PA and lateral chest x-ray because of her      smoking habit, which was read out as no active cardiopulmonary     disease.  The patient is encouraged to  consider smoking cessation.   In summary, this is a delightful patient who does seem medically stable  at this time.  Prescriptions were provided.  She is asked to return to  see me on a p.r.n. basis.     Rosalyn Gess Norins, MD  Electronically Signed    MEN/MedQ  DD: 06/25/2006  DT: 06/25/2006  Job #: [id number]

## 2010-07-11 NOTE — Op Note (Signed)
NAME:  [name], [name]                  ACCOUNT NO.:  [id number]   MEDICAL RECORD NO.:  [id number]                   PATIENT TYPE:  OBV   LOCATION:  0098                                 FACILITY:  [facility]   PHYSICIAN:  Wilhemina Bonito. Marina Goodell, M.D. [facility]             DATE OF BIRTH:  May 31, 1960   DATE OF PROCEDURE:  05/10/2003  DATE OF DISCHARGE:                                 OPERATIVE REPORT   PROCEDURE:  Endoscopic retrograde cholangiopancreatography with biliary  sphincterotomy.   INDICATIONS:  Abdominal pain and elevated liver function tests, post  laparoscopic cholecystectomy.   HISTORY:  This is a 50 year old female with a history of gastroesophageal  reflux disease and prior cholecystectomy who was evaluated at [facility] emergency room on April 30, 2003 with severe abdominal pain and  elevated liver function tests.  She underwent abdominal ultrasound which  revealed no evidence of common duct stone with a common bile duct of  approximately 7 mm.  She was seen the following day in my office and was  felt to have had a common duct stone.  She was feeling better, and follow-up  liver function tests were significantly improved.  Amylase and lipase were  normal.  We discussed the prospects of ERCP and will thoroughly interrogate  the bile duct as well as the prospects of biliary sphincterotomy with or  without identified stone.  She is now for that exam.  Since leaving the  office, she has done well except for complaints of intermittent nausea.  No  recurrent severe pain.   PHYSICAL EXAMINATION:  VITAL SIGNS:  Stable.  GENERAL:  A well-appearing female in no acute distress.  She is alert and  oriented.  LUNGS:  Clear to auscultation.  ABDOMEN:  Benign.   PROCEDURE:  After informed consent was obtained, the patient was sedated  with 180 mg Demerol and 15 mg of Versed IV over the course of the procedure.  In addition, Glucagon 0.5 mg IV was given as a duodenal relaxant.   Unasyn  1.5 gm IV was provided pre procedurally.  The Olympus side-viewing endoscope  was passed blindly into the esophagus.  The distal esophagus was normal.  The stomach was normal except for the presence of several scattered, small,  benign-appearing polyps.  Most consistent with benign fundic gland type.  The duodenal bulb and post bulbar duodenum were normal.  The major ampulla  was normal.  The minor ampulla was not sought.   X-RAY FINDINGS:  1. Scout radiograph of the abdomen with the endoscope in position revealed     prior cholecystectomy clips.  2. Initial injection of contrast via the major ampulla on several occasions     yielded normal partials and normal subsequent complete pancreatogram.     Access to the biliary tree was achieved using hydrophilic guidewire.     Passed into the bile duct where bile was aspirated.  Subsequently,     complete filling  of the biliary tree revealed no obvious abnormalities     post cholecystectomy.  No evidence of obvious filling defects.  There was     slight under-filling of the left intrahepatic system.  The common bile     duct diameter was approximately 8 mm.  3. Therapy:  A hydrophilic guidewire was left in the proximal biliary tree.     Subsequently, a standard biliary sphincterotomy (moderate sized) was     made.  Cutting was in the 12o'clock orientation over the guidewire using     the ERBE system.  The cutting catheter was then exchanged for an 8.5 mm     balloon.  This was swept through the bile duct without retrieval of     stones or debris.  Subsequent drainage of the biliary tree was deemed     excellent.   IMPRESSION:  Recent problems with acute abdominal pain associated with  marked elevation of liver function tests with subsequently abrupt  resolution, likely due to passed common bile duct stone versus sphincter of  Oddi dysfunction, status post endoscopic retrograde cholangiopancreatography  with biliary sphincterotomy.    RECOMMENDATIONS:  1. Admit to observation post procedure.  2. Anticipated discharge in a.m.                                               Wilhemina Bonito. Marina Goodell, M.D. [facility]    JNP/MEDQ  D:  05/10/2003  T:  05/10/2003  Job:  [id number]   cc:   Rosalyn Gess. Norins, M.D. [facility]

## 2010-07-11 NOTE — Discharge Summary (Signed)
NAME:  [name], [name]                  ACCOUNT NO.:  [id number]   MEDICAL RECORD NO.:  [id number]                   PATIENT TYPE:  INP   LOCATION:  0362                                 [facility]ACILITY[facility]   PHYSICIAN:  Jamison Neighbor, M.D.               DATE OF BIRTH:  15-Feb-1961   DATE OF ADMISSION:  07/19/2003  DATE OF DISCHARGE:  07/21/2003                                 DISCHARGE SUMMARY   DISCHARGE DIAGNOSIS:  Ureteropelvic junction obstruction.   PRINCIPAL PROCEDURE:  Pyloroplasty done on day of admission.   HISTORY:  This 50 year old female has had problems with pain in the right  upper quadrant. She was carefully evaluated and was found to have  obstruction on the right. A stent was placed and the patient did seem to  feel better. The thoughts were about possibly doing an _________ and  pylorotomy. There was some concern she might have a crossing vessel and for  that reason she elected to undergo standard pyloroplasty. The patient was  admitted to the hospital for that procedure.   The patient's past medical history, family history, social history, review  of systems, etc., are placed on our office chart.   The patient was taken to the operating room on Jul 19, 2003, she underwent  _________ pyloroplasty. She did indeed have a crossing vessel and it was the  source for the patient's obstruction. Her postoperative course was  unremarkable. She was advanced to a regular diet without difficulty. She was  able to ambulate without any problems. The patient did have minimal JP  drainage and it was able to be removed. She was sent home no Jul 21, 2003.  She will return to the office in follow-up for removal of her staples. She  is given Percocet as well as Septra DS. The patient will have a stent placed  and removed in several weeks.                                               Jamison Neighbor, M.D.    RJE/MEDQ  D:  08/23/2003  T:  08/23/2003  Job:  [id number]

## 2010-07-11 NOTE — Discharge Summary (Signed)
NAME:  [name], [name]                  ACCOUNT NO.:  [id number]   MEDICAL RECORD NO.:  [id number]                   PATIENT TYPE:  OBV   LOCATION:  0098                                 FACILITY:  [facility]   PHYSICIAN:  Wilhemina Bonito. Marina Goodell, M.D. [facility]             DATE OF BIRTH:  12/01/1960   DATE OF ADMISSION:  05/10/2003  DATE OF DISCHARGE:  05/11/2003                                 DISCHARGE SUMMARY   ADMISSION DIAGNOSES:  1/  Observation following endoscopic retrograde  cholangiopancreatography with sphincterotomy.  1. History of abdominal pain associated with elevated liver function tests.     Rule out secondary to past biliary sludge versus past stones in the bile,     passed common duct stones.  Rule out sphincter VO DI dysfunction.  2. Status post cholecystectomy.  3. Hemorrhoidectomy.  4. Status post tubal ligation.  5. Anxiety.  6. Family history of colon cancer.  Status post upper endoscopy and     colonoscopy, May 30, 2002.  Colonoscopy normal.  Benign peptic stricture     on upper endoscopy.  7. Right kidney hydronephrosis.  No evidence for nephrolithiasis, etiology     unclear.   DISCHARGE DIAGNOSES:  1. Stable following endoscopic retrograde cholangiopancreatography with     sphincterotomy.  2. Right hydronephrosis with plans for outpatient referral to Dr. Logan Hogan.   ALLERGIES:  No known drug allergies.   BRIEF HISTORY AND PHYSICAL:  [name], her legal name being [name], but  she is registered at [facility] as [name] and at the [facility] system as [name].  This is noted to avoid any confusion  in trying to find records.  Her date of birth is 1960-08-12.   [name] is a 50 year old white female who has a history of  gastroesophageal reflux and prior cholecystectomy.  She was seen in the  [facility] Emergency Room on March 7 with complaints of abdominal pain.  This was moderately severe.  Associated with this were elevated  liver  function tests, notably an SGOT of 329, SGPT of 198, alkaline phosphatase of  96, and total bilirubin of 0.8.  CBC and basic chemistries as well as lipase  and pregnancy tests were normal.  Urinalysis was unremarkable except for  evidence for contamination.  An ultrasound of the abdomen was performed and  showed her postcholecystectomy state.  No common duct stones were seen.  There was some right kidney hydronephrosis, but no nephrolithiasis.  Some  urine in the bladder was noted which did not change following voiding.   The patient was referred by Dr. Lynelle Doctor to Dr. Marina Goodell, who had performed her  prior endoscopies.  He evaluated her in the office on March 8.  The events  leading up to her presentation at the emergency room had been that she had  some globus sensation, was under increased stress, and had developed severe  epigastric pain radiating bilaterally into  the rib cages and this was  associated with nausea and vomiting.  She was treated in the emergency room  with some IV fluids and antiemetics and analgesia and symptoms improved,  though the pain did not completely resolve.  By the time she saw Dr. Marina Goodell  several days later she was still having some nausea, but the pain had  subsided.  Her sat her up for ERCP which was performed on March 17.  On  endoscopy there was no evidence for recurrent stricture.  There were a few  small gastric polyps and the bulbar duodenum, second part of the duodenum,  and ampulla appeared normal.  Plain x-rays showed the clips from prior  cholecystectomy.  Pancreatogram was normal and the biliary tree was also  normal without evidence for dilatation.  Dr. Marina Goodell performed a  sphincterotomy and achieved excellent drainage of bile following this  procedure.  No stones were evident in the affluence.  Dr. Lamar Sprinkles thinking  was that the patient either had sphincter VO DI or that she has had some  sludge, perhaps stones in the bile duct which had passed  and had led up to  her symptoms as well as the elevation of her LFTs.  When he had seen her in  the office on March 8, he had repeated the LFTs and her SGOT was now 89 and  her SGPT was 149.  Alkaline phosphatase and total bilirubin were normal as  were coags.  A repeat urinalysis was negative for anything worrisome.   Following the ERCP, the patient, per usual protocol, was to be admitted for  overnight observation.   PROCEDURES:  ERCP as above.   CONSULTATIONS:  None.   HOSPITAL COURSE:  The patient was watched overnight in the hospital.  Unfortunately the hospital was quite crowded and she ended up in the PACU  for observation.  She did well.  She was allowed clears and she did have an  incidence of nausea without vomiting.  Again she had been having  intermittent nausea since the time she had presented to the emergency room.  He treated her with Protonix, the formulary PPI at the hospital, though she  does usually take Prevacid.  Early in the morning of March 18, she had some  pain over on the right flank, but this resolved with a couple of Tylenol.  She did receive a single dose of Unasyn after the ERCP.  The following  morning she was pain free and was not having any nausea.  She appeared to be  tolerating a regular diet.  The only issue that morning when she was  reevaluated was that her temperature was 100.2.  Plan was to let her go home  if she did not develop any acute distress following her breakfast.  She was  to complete a three day course of oral ciprofloxacin.   FOLLOW UP:  Appointment was made to see Dr. Marina Goodell in about a month's time,  specific date to be set.  She did have a urologic evaluation set up for  April 1 at 3:15 with Dr. Marcelyn Bruins.   DISCHARGE MEDICATIONS:  1. Synthroid 50 mcg daily.  2. Celebrex 200 mg daily.  3. Prevacid 30 mg daily.  4. Additionally ciprofloxacin 500 mg twice daily for three days.  Note:  The Celebrex had been held for a couple  of days prior to the  procedure, but Dr. Lily Lovings it to be restarted.   CONDITION ON DISCHARGE:  Stable, improved.   She is to call Dr. Lamar Sprinkles office if she has any recurrent abdominal  distress or pain.   SPECIAL INSTRUCTIONS:  She is to watch her stools for any black, bloody, or  tarry-looking stools, suggestive of a GI bleed.   >     Jennye Moccasin, P.A. [facility]                   John N. Marina Goodell, M.D. [facility]    SG/MEDQ  D:  05/11/2003  T:  05/12/2003  Job:  [id number]   cc:   Jamison Neighbor, M.D.  [address]  [place]  [place] [address]  Fax: [phone]

## 2010-07-16 ENCOUNTER — Other Ambulatory Visit: Payer: Self-pay | Admitting: Internal Medicine

## 2010-07-16 ENCOUNTER — Other Ambulatory Visit: Payer: Self-pay

## 2010-07-16 DIAGNOSIS — Z0000 Encounter for general adult medical examination without abnormal findings: Secondary | ICD-10-CM

## 2010-07-29 ENCOUNTER — Encounter: Payer: Self-pay | Admitting: Internal Medicine

## 2010-08-04 ENCOUNTER — Telehealth: Payer: Self-pay | Admitting: *Deleted

## 2010-08-04 MED ORDER — ALPRAZOLAM 1 MG PO TABS: 1.0000 mg | ORAL_TABLET | Freq: Two times a day (BID) | ORAL | Status: DC

## 2010-08-04 NOTE — Telephone Encounter (Signed)
k x 5. Needs to schedule f/u OV

## 2010-08-04 NOTE — Telephone Encounter (Signed)
[facility] [address] [phone]. Alprazolam 1 mg SIG: take one tablet by mouth twice a day prn last fill 04/10/2010 QTY 60. Last ov was 12/05/2009. Please advise refills

## 2010-08-18 ENCOUNTER — Other Ambulatory Visit (INDEPENDENT_AMBULATORY_CARE_PROVIDER_SITE_OTHER): Payer: Self-pay

## 2010-08-18 ENCOUNTER — Other Ambulatory Visit (INDEPENDENT_AMBULATORY_CARE_PROVIDER_SITE_OTHER): Payer: Self-pay | Admitting: Internal Medicine

## 2010-08-18 DIAGNOSIS — Z0000 Encounter for general adult medical examination without abnormal findings: Secondary | ICD-10-CM

## 2010-08-18 LAB — BASIC METABOLIC PANEL
BUN: 14 mg/dL (ref 6–23)
Chloride: 106 mEq/L (ref 96–112)
Creatinine, Ser: 0.9 mg/dL (ref 0.4–1.2)
GFR: 67.33 mL/min (ref >60.00)
Glucose, Bld: 89 mg/dL (ref 70–99)
Potassium: 4 mEq/L (ref 3.5–5.1)

## 2010-08-18 LAB — CBC WITH DIFFERENTIAL/PLATELET
Basophils Relative: 1.4 % (ref 0.0–3.0)
Eosinophils Absolute: 0.2 10*3/uL (ref 0.0–0.7)
Eosinophils Relative: 3.5 % (ref 0.0–5.0)
Hemoglobin: 14.3 g/dL (ref 12.0–15.0)
MCHC: 34.2 g/dL (ref 30.0–36.0)
MCV: 98.1 fl (ref 78.0–100.0)
Monocytes Absolute: 0.5 10*3/uL (ref 0.1–1.0)
Neutro Abs: 2.7 10*3/uL (ref 1.4–7.7)
RBC: 4.25 Mil/uL (ref 3.87–5.11)
WBC: 5.9 10*3/uL (ref 4.5–10.5)

## 2010-08-18 LAB — URINALYSIS, ROUTINE W REFLEX MICROSCOPIC
Ketones, ur: NEGATIVE
Specific Gravity, Urine: >=1.03 (ref 1.000–1.030)
Total Protein, Urine: NEGATIVE
Urine Glucose: NEGATIVE
pH: 5.5 (ref 5.0–8.0)

## 2010-08-18 LAB — LIPID PANEL
Cholesterol: 140 mg/dL (ref 0–200)
LDL Cholesterol: 50 mg/dL (ref 0–99)
VLDL: 17.2 mg/dL (ref 0.0–40.0)

## 2010-08-18 LAB — HEPATIC FUNCTION PANEL
Alkaline Phosphatase: 57 U/L (ref 39–117)
Bilirubin, Direct: 0.1 mg/dL (ref 0.0–0.3)
Total Bilirubin: 0.4 mg/dL (ref 0.3–1.2)
Total Protein: 6.5 g/dL (ref 6.0–8.3)

## 2010-08-24 ENCOUNTER — Encounter: Payer: Self-pay | Admitting: Internal Medicine

## 2010-08-26 ENCOUNTER — Ambulatory Visit (INDEPENDENT_AMBULATORY_CARE_PROVIDER_SITE_OTHER): Payer: Managed Care, Other (non HMO) | Admitting: Internal Medicine

## 2010-08-26 ENCOUNTER — Encounter: Payer: Self-pay | Admitting: Internal Medicine

## 2010-08-26 VITALS — BP 110/72 | HR 84 | Temp 98.7°F | Wt 146.0 lb

## 2010-08-26 DIAGNOSIS — G4730 Sleep apnea, unspecified: Secondary | ICD-10-CM

## 2010-08-26 DIAGNOSIS — Z23 Encounter for immunization: Secondary | ICD-10-CM

## 2010-08-26 DIAGNOSIS — E039 Hypothyroidism, unspecified: Secondary | ICD-10-CM

## 2010-08-26 DIAGNOSIS — R233 Spontaneous ecchymoses: Secondary | ICD-10-CM

## 2010-08-26 DIAGNOSIS — R1013 Epigastric pain: Secondary | ICD-10-CM

## 2010-08-26 DIAGNOSIS — M1990 Unspecified osteoarthritis, unspecified site: Secondary | ICD-10-CM

## 2010-08-26 DIAGNOSIS — Z0000 Encounter for general adult medical examination without abnormal findings: Secondary | ICD-10-CM

## 2010-08-26 DIAGNOSIS — N6009 Solitary cyst of unspecified breast: Secondary | ICD-10-CM

## 2010-08-26 DIAGNOSIS — F411 Generalized anxiety disorder: Secondary | ICD-10-CM

## 2010-08-26 MED ORDER — HYDROCODONE-ACETAMINOPHEN 5-325 MG PO TABS: 1.0000 | ORAL_TABLET | Freq: Four times a day (QID) | ORAL | Status: DC | PRN

## 2010-08-26 MED ORDER — LEVOTHYROXINE SODIUM 50 MCG PO TABS: 50.0000 ug | ORAL_TABLET | Freq: Every day | ORAL | Status: DC

## 2010-08-26 MED ORDER — ALPRAZOLAM 1 MG PO TABS: 1.0000 mg | ORAL_TABLET | Freq: Two times a day (BID) | ORAL | Status: DC

## 2010-08-26 MED ORDER — RANITIDINE HCL 150 MG PO TABS: 150.0000 mg | ORAL_TABLET | Freq: Two times a day (BID) | ORAL | Status: DC

## 2010-08-26 MED ORDER — MELOXICAM 15 MG PO TABS: 15.0000 mg | ORAL_TABLET | Freq: Every day | ORAL | Status: DC

## 2010-08-26 MED ORDER — TETANUS-DIPHTH-ACELL PERTUSSIS 5-2.5-18.5 LF-MCG/0.5 IM SUSP: 0.5000 mL | Freq: Once | INTRAMUSCULAR | Status: DC

## 2010-08-26 MED ORDER — FEXOFENADINE HCL 180 MG PO TABS: 180.0000 mg | ORAL_TABLET | Freq: Every day | ORAL | Status: DC

## 2010-08-26 NOTE — Progress Notes (Signed)
Subjective:    Patient ID: [name], female    DOB: 1960/12/09, 50 y.o.   MRN: [id number]  HPI In generally doing well medically. She has had some dental problems - receeding gum lines which are painful. Some family stress- father is quite ill and in a hospital in [place]; daughter, 76, has a child and is struggling; husband just employed x 1 week. She has had persistent fibrocystic breast disease.   Past Medical History  Diagnosis Date  . Ganglion cyst   . Family hx of colon cancer   . Epigastric abdominal pain   . Anxiety disorder   . Esophageal stricture   . GERD (gastroesophageal reflux disease)   . DJD (degenerative joint disease)   . Hemorrhoid   . Cholelithiasis   . Sleep apnea   . Hypothyroidism    Past Surgical History  Procedure Date  . Cholecystectomy   . Hemorrhoid surgery   . Tubal ligation   . Cesarean section   . Urologic surgery for ureteropelvic junction obstruction   . Abdominal hysterectomy    No family history on file. History   Social History  . Marital Status: Married    Spouse Name: N/A    Number of Children: N/A  . Years of Education: N/A   Occupational History  . Not on file.   Social History Main Topics  . Smoking status: Not on file  . Smokeless tobacco: Not on file  . Alcohol Use: Not on file  . Drug Use: Not on file  . Sexually Active: Not on file   Other Topics Concern  . Not on file   Social History Narrative   HSG, some community college. Married.   2 daughters- one daughter living at home with bipolar dz. Has had behavior issues. 1 granddaughter living with her. Occupation:Bank worker       Review of Systems Review of Systems  Constitutional:  Negative for fever, chills, activity change and unexpected weight change.  HEENT:  Negative for hearing loss, ear pain, congestion, neck stiffness and postnasal drip. Negative for sore throat or swallowing problems. Negative for dental complaints.   Eyes: Negative  for vision loss or change in visual acuity.  Respiratory: Negative for chest tightness and wheezing.   Cardiovascular: Negative for chest pain and palpitation. No decreased exercise tolerance Gastrointestinal: No change in bowel habit. No bloating or gas. No reflux or indigestion Genitourinary: Negative for urgency, frequency, flank pain and difficulty urinating.  Musculoskeletal: Negative for myalgias, back pain, arthralgias and gait problem.  Neurological: Negative for dizziness, tremors, weakness and headaches.  Hematological: Negative for adenopathy.  Psychiatric/Behavioral: Negative for behavioral problems and dysphoric mood.       Objective:   Physical Exam Vitals reviewed. Gen'l: well nourished, well developed white woman in no distress HEENT - [place]/AT, EACs/TMs normal, oropharynx with native dentition in good condition, no buccal or palatal lesions, posterior pharynx clear, mucous membranes moist. C&S clear, PERRLA, fundi - normal Neck - supple, no thyromegaly Nodes- negative submental, cervical, supraclavicular regions Chest - no deformity, no CVAT Lungs - cleat without rales, wheezes. No increased work of breathing Breast - skin normal, nipples without discharge, generalized fibrocystic changes worse in the right breast, no fix mass or suspicious lesion, no axillary lymphadenopathy Cardiovascular - regular rate and rhythm, quiet precordium, no murmurs, rubs or gallops, 2+ radial, DP and PT pulses Abdomen - BS+ x 4, no HSM, no guarding or rebound or tenderness Pelvic - deferred  Rectal -  deferred  Extremities - no clubbing, cyanosis, edema or deformity.  Neuro - A&O x 3, CN II-XII normal, motor strength normal and equal, DTRs 2+ and symmetrical biceps, radial, and patellar tendons. Cerebellar - no tremor, no rigidity, fluid movement and normal gait. Derm - Head, neck, back, abdomen and extremities without suspicious lesions. Body art: left chest; both arms; left anklet; right  calve.  Lab Results  Component Value Date   WBC 5.9 08/18/2010   HGB 14.3 08/18/2010   HCT 41.7 08/18/2010   PLT 201.0 08/18/2010   CHOL 140 08/18/2010   TRIG 86.0 08/18/2010   HDL 72.50 08/18/2010   ALT 18 08/18/2010   AST 18 08/18/2010   NA 140 08/18/2010   K 4.0 08/18/2010   CL 106 08/18/2010   CREATININE 0.9 08/18/2010   BUN 14 08/18/2010   CO2 28 08/18/2010   TSH 1.17 08/18/2010   Lab Results  Component Value Date   LDLCALC 50 08/18/2010           Assessment & Plan:

## 2010-08-27 ENCOUNTER — Encounter: Payer: Self-pay | Admitting: Internal Medicine

## 2010-08-27 DIAGNOSIS — Z0000 Encounter for general adult medical examination without abnormal findings: Secondary | ICD-10-CM | POA: Insufficient documentation

## 2010-08-27 NOTE — Assessment & Plan Note (Signed)
Essentially stable. She has had extensive evaluation in  The past including multiple imaging studies abdomen, pelvis, transvaginally.  Plan - no further evaluation at this time.

## 2010-08-27 NOTE — Assessment & Plan Note (Signed)
Still a minor problem with no significant bruises at today's visit.

## 2010-08-27 NOTE — Assessment & Plan Note (Signed)
Many stressors but she is doing relatively well. No change in medication needed.

## 2010-08-27 NOTE — Assessment & Plan Note (Signed)
Thyroid exam is normal. TSH in normal range.  Plan - continue present dose of thyroid replacement

## 2010-08-27 NOTE — Assessment & Plan Note (Signed)
Interval medical history is unremarkable. Physical exam, sans pelvic, is normal with breast findings as noted. Lab results are in normal range with an excellent cholesterol panel. She is current with health maintenance, i.e. Mammography. Immunizations are up to date.   In summary- a delightful woman who is medically stable. She will return in 1 year or sooner as needed.

## 2010-08-27 NOTE — Assessment & Plan Note (Signed)
Still with discomfort but not limiting any activities.

## 2010-08-27 NOTE — Assessment & Plan Note (Signed)
Recent flare leadng to diagnostic mammography and ultra-sound due to large cyst. No surgical intervention needed. Exam today c/w with stable fibrocystic changes.  Plan - continue avoidance therapy: caffeine and chocolat. Awareness of hormonal changes as aggravating factor.

## 2010-11-24 LAB — BASIC METABOLIC PANEL
BUN: 5 — ABNORMAL LOW
Chloride: 102
Glucose, Bld: 110 — ABNORMAL HIGH
Potassium: 3.4 — ABNORMAL LOW

## 2010-11-24 LAB — CBC
HCT: 27.8 — ABNORMAL LOW
Hemoglobin: 8.8 — ABNORMAL LOW
RDW: 17.3 — ABNORMAL HIGH
WBC: 11.7 — ABNORMAL HIGH

## 2010-11-26 LAB — BASIC METABOLIC PANEL WITH GFR
BUN: 12
CO2: 26
Calcium: 8.8
Chloride: 105
Creatinine, Ser: 0.78
GFR calc non Af Amer: >60
Glucose, Bld: 101 — ABNORMAL HIGH
Potassium: 3.8
Sodium: 138

## 2010-11-26 LAB — CBC
HCT: 35.7 — ABNORMAL LOW
Hemoglobin: 11.3 — ABNORMAL LOW
MCHC: 31.6
MCV: 74.8 — ABNORMAL LOW
Platelets: 287
RBC: 4.77
RDW: 17.5 — ABNORMAL HIGH
WBC: 5.3

## 2010-11-26 LAB — PREGNANCY, URINE: Preg Test, Ur: NEGATIVE

## 2010-12-15 ENCOUNTER — Ambulatory Visit (INDEPENDENT_AMBULATORY_CARE_PROVIDER_SITE_OTHER): Payer: Managed Care, Other (non HMO) | Admitting: Internal Medicine

## 2010-12-15 ENCOUNTER — Telehealth: Payer: Self-pay

## 2010-12-15 DIAGNOSIS — R21 Rash and other nonspecific skin eruption: Secondary | ICD-10-CM

## 2010-12-15 DIAGNOSIS — B029 Zoster without complications: Secondary | ICD-10-CM | POA: Insufficient documentation

## 2010-12-15 MED ORDER — ACYCLOVIR 800 MG PO TABS: 800.0000 mg | ORAL_TABLET | Freq: Every day | ORAL | Status: AC

## 2010-12-15 MED ORDER — PREDNISONE 20 MG PO TABS: 20.0000 mg | ORAL_TABLET | Freq: Every day | ORAL | Status: AC

## 2010-12-15 MED ORDER — GABAPENTIN 600 MG PO TABS: ORAL_TABLET | ORAL | Status: DC

## 2010-12-15 NOTE — Progress Notes (Signed)
  Subjective:    Patient ID: [name], female    DOB: 1960/04/30, 50 y.o.   MRN: [id number]  HPI [name] presents with a painful erythematous vesicular rash under the left breast and to the back in patches. There is a question of an area on the left upper chest. She first noted the outbreak Saturday. The rash is painful.  I have reviewed the patient's medical history in detail and updated the computerized patient record.    Review of Systems System review is negative for any constitutional, cardiac, pulmonary, GI or neuro symptoms or complaints other than as described in the HPI.     Objective:   Physical Exam Vitals noted Gen'l - WNWD white woman in no distress Derm - three patches left side from under breast up to midline, at anterior edge axilla and small area left of midline of back. Small patch erythema with early vesicles left upper chest.       Assessment & Plan:

## 2010-12-15 NOTE — Telephone Encounter (Signed)
Pt has appt schedule with [facility] today.

## 2010-12-15 NOTE — Patient Instructions (Signed)
Shingles - relatively early. Plan - acyclovir 800 mg 5 times a day for 7 days; prednisone 20 mg daily x 7 days; gabapentin for the pain as instructed - it is a taper up to the full dose to minimize drowsiness.    Shingles Shingles is caused by the same virus that causes chickenpox (varicella zoster virus or VZV). Shingles often occurs many years or decades after having chickenpox. That is why it is more common in adults older than 50 years. The virus reactivates and breaks out as an infection in a nerve root. SYMPTOMS    The initial feeling (sensations) may be pain. This pain is usually described as:     Burning.    Stabbing.    Throbbing.    Tingling in the nerve root.     A red rash will follow in a couple days. The rash may occur in any area of the body and is usually on one side (unilateral) of the body in a band or belt-like pattern. The rash usually starts out as very small blisters (vesicles). They will dry up after 7 to 10 days. This is not usually a significant problem except for the pain it causes.     Long-lasting (chronic) pain is more likely in an elderly person. It can last months to years. This condition is called postherpetic neuralgia.  Shingles can be an extremely severe infection in someone with AIDS, a weakened immune system, or with forms of leukemia. It can also be severe if you are taking transplant medicines or other medicines that weaken the immune system. TREATMENT   Your caregiver will often treat you with:  Antiviral drugs.     Anti-inflammatory drugs.     Pain medicines.  Bed rest is very important in preventing the pain associated with herpes zoster (postherpetic neuralgia). Application of heat in the form of a hot water bottle or electric heating pad or gentle pressure with the hand is recommended to help with the pain or discomfort. PREVENTION   A varicella zoster vaccine is available to help protect against the virus. The Food and Drug Administration  approved the varicella zoster vaccine for individuals 26 years of age and older. HOME CARE INSTRUCTIONS    Cool compresses to the area of rash may be helpful.     Only take over-the-counter or prescription medicines for pain, discomfort, or fever as directed by your caregiver.     Avoid contact with:     Babies.    Pregnant women.     Children with eczema.     Elderly people with transplants.     People with chronic illnesses, such as leukemia and AIDS.     If the area involved is on your face, you may receive a referral for follow-up to a specialist. It is very important to keep all follow-up appointments. This will help avoid eye complications, chronic pain, or disability.  SEEK IMMEDIATE MEDICAL CARE IF:    You develop any pain (headache) in the area of the face or eye. This must be followed carefully by your caregiver or ophthalmologist. An infection in part of your eye (cornea) can be very serious. It could lead to blindness.     You do not have pain relief from prescribed medicines.     Your redness or swelling spreads.     The area involved becomes very swollen and painful.     You have a fever.     You notice any red or painful  lines extending away from the affected area toward your heart (lymphangitis).     Your condition is worsening or has changed.  Document Released: 02/09/2005 Document Revised: 10/22/2010 Document Reviewed: 01/14/2009 [facility] Patient Information 2012 [place], [place].

## 2010-12-15 NOTE — Assessment & Plan Note (Signed)
Painful rash c/w shingles  Plan - acycyclovir 800 mg 5 times a day x 7 days; prednisone 20 mg daily x 7; gabapentin 600mg  with rapid titration from 300 mg once a day to 600 mg tid.

## 2010-12-15 NOTE — Telephone Encounter (Signed)
[facility] Triage Call Report Triage Record Num: [id number] Operator: Frederico Hamman Patient Name: [name] Call Date & Time: 12/14/2010 5:11:24PM Patient Phone: [phone] PCP: Illene Regulus Patient Gender: Female PCP Fax : [phone] Patient DOB: February 26, 1960 Practice Name: [facility] Reason for Call: [name] states she had onset of 2- 50 cent piece areas red, painful rash under breast. States rash is blistered. Daughter thinks mother has shingles. Per rash protocol advised to be seen within 4 hrs due to rash becoming painful. Care advice given. Protocol(s) Used: Rash Protocol(s) Used: Skin Lesions Recommended Outcome per Protocol: See Provider within 4 hours Reason for Outcome: Localized or widespread rash Signs and symptoms of worsening infection (quickly getting larger or more areas develop; becoming more painful; purulent drainage; new fever; not improving with treatment) Care Advice: ~ Call provider if symptoms worsen or new symptoms develop. ~ SYMPTOM / CONDITION MANAGEMENT Do not use a heating pad, hot water bottle, heat lamps, hot or cold solutions, or ice packs until evaluated by your provider. ~ 12/14/2010 5:25:07PM Page 1 of 1 [id number]

## 2010-12-22 ENCOUNTER — Telehealth: Payer: Self-pay | Admitting: *Deleted

## 2010-12-22 MED ORDER — ONDANSETRON HCL 4 MG PO TABS: 4.0000 mg | ORAL_TABLET | Freq: Four times a day (QID) | ORAL | Status: AC | PRN

## 2010-12-22 NOTE — Telephone Encounter (Signed)
Pt is taking meds as prescribed, except she does not need full dose of gabapentin to control pain. Rash is drying up. Pt c/o constant headaches and nausea off and on. She is allergic to promethazine, causes itching and rash. Pain meds help ease the headache and rash. Side effects cause her to be groggy and she is not able to work.   Patient requesting to remain out of work this week and needs note from MD. OK? Also, what does MD suggest for nausea?

## 2010-12-22 NOTE — Telephone Encounter (Signed)
Patient informed, RX sent in, work note at front desk.

## 2010-12-22 NOTE — Telephone Encounter (Signed)
OK to extend work excuse For nausea generic zofran 4 mg every 6 hours as needed. #15, 1 refill

## 2011-01-21 ENCOUNTER — Telehealth: Payer: Self-pay | Admitting: *Deleted

## 2011-01-21 MED ORDER — ALPRAZOLAM 1 MG PO TABS: 1.0000 mg | ORAL_TABLET | Freq: Two times a day (BID) | ORAL | Status: DC

## 2011-01-21 NOTE — Telephone Encounter (Signed)
k with 2 add'l refills

## 2011-01-21 NOTE — Telephone Encounter (Signed)
Refill [facility] from [facility] [address] [phone]. For Alprazolam 1 mg SIG one tab bid qty 60. Last refill was 12/20/2010

## 2011-02-25 ENCOUNTER — Other Ambulatory Visit: Payer: Self-pay | Admitting: *Deleted

## 2011-02-25 MED ORDER — LEVOTHYROXINE SODIUM 50 MCG PO TABS: 50.0000 ug | ORAL_TABLET | Freq: Every day | ORAL | Status: DC

## 2011-03-30 ENCOUNTER — Telehealth: Payer: Self-pay

## 2011-03-30 MED ORDER — HYDROCODONE-ACETAMINOPHEN 5-325 MG PO TABS: 1.0000 | ORAL_TABLET | Freq: Four times a day (QID) | ORAL | Status: DC | PRN

## 2011-03-30 NOTE — Telephone Encounter (Signed)
rx called into pharmacy

## 2011-03-30 NOTE — Telephone Encounter (Signed)
Ok for refill thanks 

## 2011-03-30 NOTE — Telephone Encounter (Signed)
Please advise if ok to refill hydrocodone 5/325 for this patient. RX last filled 08/26/10 #30/3rf and last seen 12/15/10

## 2011-04-15 ENCOUNTER — Other Ambulatory Visit: Payer: Self-pay | Admitting: *Deleted

## 2011-04-15 MED ORDER — ALPRAZOLAM 1 MG PO TABS: 1.0000 mg | ORAL_TABLET | Freq: Two times a day (BID) | ORAL | Status: DC

## 2011-04-15 NOTE — Telephone Encounter (Signed)
R[facility]cd [facility]-last written 01/21/2011 #60 with 2 refills-please advise.

## 2011-04-15 NOTE — Telephone Encounter (Signed)
Ok for refill  x3 

## 2011-04-15 NOTE — Telephone Encounter (Signed)
Rx called in to pharmacy. 

## 2011-04-24 ENCOUNTER — Telehealth: Payer: Self-pay | Admitting: *Deleted

## 2011-04-24 MED ORDER — ALPRAZOLAM 1 MG PO TABS: 1.0000 mg | ORAL_TABLET | Freq: Two times a day (BID) | ORAL | Status: DC

## 2011-04-24 MED ORDER — MELOXICAM 15 MG PO TABS: 15.0000 mg | ORAL_TABLET | Freq: Every day | ORAL | Status: DC

## 2011-04-24 MED ORDER — FEXOFENADINE HCL 180 MG PO TABS: 180.0000 mg | ORAL_TABLET | Freq: Every day | ORAL | Status: DC

## 2011-04-24 MED ORDER — HYDROCODONE-ACETAMINOPHEN 5-325 MG PO TABS: 1.0000 | ORAL_TABLET | Freq: Four times a day (QID) | ORAL | Status: DC | PRN

## 2011-04-24 NOTE — Telephone Encounter (Signed)
Patient states insurance requires 90-day supply on med refills: Ok for 90-day for Alprazolam [new] & Hydrocodone-APAP.?  Pt also would like to start smoking cessation and would like to know if she needs an OV or can she get an Rx.?  Please advise.

## 2011-04-24 NOTE — Telephone Encounter (Signed)
LMOM regarding Rx refills & to inform patient that OV needed, please call to schedule.

## 2011-04-24 NOTE — Telephone Encounter (Signed)
Ok for 90 Rx for requested meds.  Needs OV for smoking cessation

## 2011-05-15 ENCOUNTER — Encounter: Payer: Self-pay | Admitting: Internal Medicine

## 2011-07-28 ENCOUNTER — Telehealth: Payer: Self-pay | Admitting: Internal Medicine

## 2011-07-28 NOTE — Telephone Encounter (Signed)
Caller: Mayumi/Patient; PCP: Illene Regulus; CB#: [phone];  Call regarding GERD; Reports > frequency of intermittent heartburn with pain under L breast. Onset: 07/21/11.  Afebrile.  LMP:  hysterectomy.  Per Epic, last office visit 12/15/10.   On Rantidine BID without relief.  Advised to see MD now for persistent burning sensation in epigastric area and no relief from home care per Heartburn Guideline.  No appts remain for 07/28/11.  Requesting 90 day RX of "affordable" antacid. [facility] [address]  Per Lakisha/office, advised to send note to MD and office will contact pt.

## 2011-07-28 NOTE — Telephone Encounter (Signed)
1. Can try otc prilosec. If no results will need to be seen. If this helps will send in 90 Rx for generic product

## 2011-07-29 NOTE — Telephone Encounter (Signed)
jpatient notified to use OTC prilosec. Patient to call back if no improvement.

## 2011-08-20 ENCOUNTER — Other Ambulatory Visit: Payer: Self-pay | Admitting: Internal Medicine

## 2011-10-20 ENCOUNTER — Other Ambulatory Visit: Payer: Self-pay | Admitting: *Deleted

## 2011-10-20 MED ORDER — ALPRAZOLAM 1 MG PO TABS: 1.0000 mg | ORAL_TABLET | Freq: Two times a day (BID) | ORAL | Status: DC

## 2011-10-20 NOTE — Telephone Encounter (Signed)
Refill on medication approved per Dr. Debby Bud. [facility] and patient notified

## 2011-10-20 NOTE — Telephone Encounter (Signed)
Ok to refill x 5 

## 2011-10-20 NOTE — Telephone Encounter (Signed)
Patient request/ pharmacy request refill on alprazolam. Last OV 11/2010

## 2011-10-20 NOTE — Telephone Encounter (Signed)
Pt called requesting status on her xanax. Pharmacy states has contacted md waiting on response.Pt is requesting a call back.Marland Kitchen/LMB

## 2011-11-19 ENCOUNTER — Other Ambulatory Visit: Payer: Self-pay | Admitting: Internal Medicine

## 2012-02-09 ENCOUNTER — Other Ambulatory Visit: Payer: Self-pay | Admitting: Internal Medicine

## 2012-03-03 ENCOUNTER — Ambulatory Visit (INDEPENDENT_AMBULATORY_CARE_PROVIDER_SITE_OTHER): Payer: Managed Care, Other (non HMO) | Admitting: Internal Medicine

## 2012-03-03 ENCOUNTER — Encounter: Payer: Self-pay | Admitting: Internal Medicine

## 2012-03-03 ENCOUNTER — Other Ambulatory Visit (INDEPENDENT_AMBULATORY_CARE_PROVIDER_SITE_OTHER): Payer: Managed Care, Other (non HMO)

## 2012-03-03 VITALS — BP 122/78 | HR 100 | Temp 98.2°F | Ht 66.5 in | Wt 147.0 lb

## 2012-03-03 DIAGNOSIS — Z13220 Encounter for screening for lipoid disorders: Secondary | ICD-10-CM

## 2012-03-03 DIAGNOSIS — J019 Acute sinusitis, unspecified: Secondary | ICD-10-CM

## 2012-03-03 DIAGNOSIS — R059 Cough, unspecified: Secondary | ICD-10-CM

## 2012-03-03 DIAGNOSIS — R05 Cough: Secondary | ICD-10-CM

## 2012-03-03 MED ORDER — BENZONATATE 200 MG PO CAPS: 200.0000 mg | ORAL_CAPSULE | Freq: Two times a day (BID) | ORAL | Status: DC | PRN

## 2012-03-03 MED ORDER — HYDROCODONE-HOMATROPINE 5-1.5 MG/5ML PO SYRP: 5.0000 mL | ORAL_SOLUTION | Freq: Three times a day (TID) | ORAL | Status: DC | PRN

## 2012-03-03 MED ORDER — AZITHROMYCIN 250 MG PO TABS: ORAL_TABLET | ORAL | Status: DC

## 2012-03-03 NOTE — Patient Instructions (Addendum)

## 2012-03-03 NOTE — Progress Notes (Signed)
HPI  Pt presents to the clinic today with 10 days of nasal congestion, fatigue, headache and ear pain. She has taken OTC cold and flu medicine and Sudafed without any relief. She has a bad cough which is worse at night. It is keeping her awake. The symptoms are getting worse each day. She has had sick contacts.  Review of Systems    Past Medical History  Diagnosis Date  . Ganglion cyst   . Family hx of colon cancer   . Epigastric abdominal pain   . Anxiety disorder   . Esophageal stricture   . GERD (gastroesophageal reflux disease)   . DJD (degenerative joint disease)   . Hemorrhoid   . Cholelithiasis   . Sleep apnea   . Hypothyroidism     History reviewed. No pertinent family history.  History   Social History  . Marital Status: Married    Spouse Name: N/A    Number of Children: N/A  . Years of Education: N/A   Occupational History  . Not on file.   Social History Main Topics  . Smoking status: Current Every Day Smoker  . Smokeless tobacco: Not on file  . Alcohol Use: Not on file  . Drug Use: Not on file  . Sexually Active: Not on file   Other Topics Concern  . Not on file   Social History Narrative   HSG, some community college. Married.   2 daughters- one daughter living at home with bipolar dz. Has had behavior issues. 1 granddaughter living with her. Occupation:Bank worker    Allergies  Allergen Reactions  . Promethazine Hcl     REACTION: reaction     Constitutional: Positive headache, fatigue and fever. Denies abrupt weight changes.  HEENT:  Positive eye pain, pressure behind the eyes, facial pain, nasal congestion and sore throat. Denies eye redness, ear pain, ringing in the ears, wax buildup, runny nose or bloody nose. Respiratory: Positive cough. Denies difficulty breathing or shortness of breath.  Cardiovascular: Denies chest pain, chest tightness, palpitations or swelling in the hands or feet.   No other specific complaints in a complete review  of systems (except as listed in HPI above).  Objective:    BP 122/78  Pulse 100  Temp 98.2 F (36.8 C) (Oral)  Ht 5' 6.5" (1.689 m)  Wt 147 lb (66.679 kg)  BMI 23.37 kg/m2  SpO2 95% Wt Readings from Last 3 Encounters:  03/03/12 147 lb (66.679 kg)  12/15/10 150 lb (68.04 kg)  08/26/10 146 lb (66.225 kg)    General: Appears her stated age, well developed, well nourished in NAD. HEENT: Head: normal shape and size; Eyes: sclera white, no icterus, conjunctiva pink, PERRLA and EOMs intact; Ears: Tm's gray and intact, normal light reflex; Nose: mucosa pink and moist, septum midline; Throat/Mouth: + PND. Teeth present, mucosa pink and moist, no exudate noted, no lesions or ulcerations noted.  Neck: Mild cervical lymphadenopathy. Neck supple, trachea midline. No massses, lumps or thyromegaly present.  Cardiovascular: Normal rate and rhythm. S1,S2 noted.  No murmur, rubs or gallops noted. No JVD or BLE edema. No carotid bruits noted. Pulmonary/Chest: Normal effort and positive vesicular breath sounds. No respiratory distress. No wheezes, rales or ronchi noted.      Assessment & Plan:   Acute bacterial sinusitis  Can use a Neti Pot which can be purchased from your local drug store. Flonase 2 sprays each nostril for 3 days and then as needed. Azithromax x 5 days Continue using  zyrtec daily  RTC as needed or if symptoms persist.

## 2012-03-31 ENCOUNTER — Ambulatory Visit (INDEPENDENT_AMBULATORY_CARE_PROVIDER_SITE_OTHER): Payer: Managed Care, Other (non HMO) | Admitting: Internal Medicine

## 2012-03-31 ENCOUNTER — Encounter: Payer: Self-pay | Admitting: Internal Medicine

## 2012-03-31 ENCOUNTER — Ambulatory Visit (INDEPENDENT_AMBULATORY_CARE_PROVIDER_SITE_OTHER)
Admission: RE | Admit: 2012-03-31 | Discharge: 2012-03-31 | Disposition: A | Discharge status: Home / Self Care | Payer: Managed Care, Other (non HMO) | Source: Ambulatory Visit | Attending: Internal Medicine | Admitting: Internal Medicine

## 2012-03-31 ENCOUNTER — Other Ambulatory Visit (INDEPENDENT_AMBULATORY_CARE_PROVIDER_SITE_OTHER): Payer: Managed Care, Other (non HMO)

## 2012-03-31 VITALS — BP 120/80 | HR 84 | Temp 98.1°F | Resp 10 | Ht 66.0 in | Wt 150.0 lb

## 2012-03-31 DIAGNOSIS — M5430 Sciatica, unspecified side: Secondary | ICD-10-CM

## 2012-03-31 DIAGNOSIS — Z0000 Encounter for general adult medical examination without abnormal findings: Secondary | ICD-10-CM

## 2012-03-31 DIAGNOSIS — F411 Generalized anxiety disorder: Secondary | ICD-10-CM

## 2012-03-31 DIAGNOSIS — E039 Hypothyroidism, unspecified: Secondary | ICD-10-CM

## 2012-03-31 DIAGNOSIS — M5431 Sciatica, right side: Secondary | ICD-10-CM

## 2012-03-31 LAB — COMPREHENSIVE METABOLIC PANEL
Albumin: 4.6 g/dL (ref 3.5–5.2)
CO2: 26 mEq/L (ref 19–32)
Calcium: 9.2 mg/dL (ref 8.4–10.5)
Chloride: 102 mEq/L (ref 96–112)
GFR: 81.75 mL/min (ref >60.00)
Glucose, Bld: 101 mg/dL — ABNORMAL HIGH (ref 70–99)
Potassium: 4 mEq/L (ref 3.5–5.1)
Sodium: 137 mEq/L (ref 135–145)
Total Protein: 7.1 g/dL (ref 6.0–8.3)

## 2012-03-31 LAB — TSH: TSH: 0.69 u[IU]/mL (ref 0.35–5.50)

## 2012-03-31 MED ORDER — SERTRALINE HCL 50 MG PO TABS: 50.0000 mg | ORAL_TABLET | Freq: Every day | ORAL | Status: DC

## 2012-03-31 NOTE — Patient Instructions (Addendum)
1. Chronic anxiety that is situational Plan Start zoloft (generic) 50mg  once a day. Call me back in 3-5 days to let me know if it helps  Consider short term counseling if things don't sort out  2. Sciatica - mild to moderate. No severe problem that will require any intervention Plan Baseline lumbar x-rays  Stretches  Continue meloxicam and supplement with tylenol or hydrocodone (be sure to count the tylenol int he vicodin as part of the days total tylenol dosing)  Physical therapy if we don't make progress  3. Breast - this is most likely a benign cyst.  4. Neck lump - this is most likely a thyroglossal cyst. If it gets bigger or bothersome we will start imaging and treatment planning  5. Thyroid disease - for routine lab today.  6. Health Maintenance - you are good.   A full report will follow including lab results.

## 2012-03-31 NOTE — Progress Notes (Signed)
Subjective:    Patient ID: [name], female    DOB: [birth year], 52 y.o.   MRN: [id number]  HPI [name] presents for a general well woman exam. She has had a tough time emotionally with her daughter gone astray and she is now raising her grand-daughter. She has been stressed and anxious on a constant basis which is interfering in the quality of her life and ability to discharge her responsibities.  She recent was seen by [name] for sinusitis - Rx with Z-pak and allegra. Still with some sinus pressure.   Past Medical History  Diagnosis Date  . Ganglion cyst   . Family hx of colon cancer   . Epigastric abdominal pain   . Anxiety disorder   . Esophageal stricture   . GERD (gastroesophageal reflux disease)   . DJD (degenerative joint disease)   . Hemorrhoid   . Cholelithiasis   . Sleep apnea   . Hypothyroidism    Past Surgical History  Procedure Date  . Cholecystectomy   . Hemorrhoid surgery   . Tubal ligation   . Cesarean section   . Urologic surgery for ureteropelvic junction obstruction   . Abdominal hysterectomy    Family History  Problem Relation Age of Onset  . COPD Mother   . Diabetes Father   . Heart disease Father   . Hypertension Father   . Hypertension Sister   . Depression Sister   . Arthritis Sister   . Cancer Neg Hx    History   Social History  . Marital Status: Married    Spouse Name: N/A    Number of Children: 2  . Years of Education: 14   Occupational History  . auditor, Scientist, water quality [employer]   Social History Main Topics  . Smoking status: Current Every Day Smoker -- 0.5 packs/day for 10 years    Types: Cigarettes  . Smokeless tobacco: Never Used  . Alcohol Use: 1.0 oz/week    2 drink(s) per week  . Drug Use: No  . Sexually Active: Yes -- Female partner(s)   Other Topics Concern  . Not on file   Social History Narrative   HSG, some community college. .Married -'81.   2 daughters  '83, '88 daughter  with bipolar  dz. Has had behavior issues. 1 granddaughter '05 living with her. Occupation:Bank worker. Marriage in good. No history of abuse.    Current Outpatient Prescriptions on File Prior to Visit  Medication Sig Dispense Refill  . ALPRAZolam (XANAX) 1 MG tablet Take 1 tablet (1 mg total) by mouth 2 (two) times daily.  60 tablet  5  . Cholecalciferol (GNP VITAMIN D) 1000 UNITS tablet Take 1,000 Units by mouth daily.        . fexofenadine (ALLEGRA) 180 MG tablet Take 1 tablet (180 mg total) by mouth daily.  90 tablet  1  . HYDROcodone-acetaminophen (NORCO/VICODIN) 5-325 MG per tablet TAKE 1 TABLET EVERY 6 HOURS AS NEEDED FOR PAIN  180 tablet  0  . HYDROcodone-homatropine (HYCODAN) 5-1.5 MG/5ML syrup Take 5 mLs by mouth every 8 (eight) hours as needed for cough.  120 mL  0  . levothyroxine (SYNTHROID, LEVOTHROID) 50 MCG tablet TAKE 1 TABLET BY MOUTH DAILY  90 tablet  1  . meloxicam (MOBIC) 15 MG tablet TAKE 1 TABLET (15 MG TOTAL) BY MOUTH DAILY.  90 tablet  3  . ranitidine (ZANTAC) 150 MG tablet Take 150 mg by mouth 2 (two) times daily.      Marland Kitchen  benzonatate (TESSALON) 200 MG capsule Take 1 capsule (200 mg total) by mouth 2 (two) times daily as needed for cough.  20 capsule  0  . gabapentin (NEURONTIN) 600 MG tablet 1/2 tablet today, 1/2 tablet twice a day tomorrow, 1/2 tablet three times a day Weds, then go to 1 tablet three times a day. At any step if you are too drowsy you may reduce the dose for a day or so and then continue to titrate up to full dose = 600 mg three times a day.  90 tablet  11   Current Facility-Administered Medications on File Prior to Visit  Medication Dose Route Frequency Provider Last Rate Last Dose  . TDaP (BOOSTRIX) injection 0.5 mL  0.5 mL Intramuscular Once Jacques Navy, MD          Review of Systems Constitutional:  Negative for fever, chills, activity change and unexpected weight change.  HEENT:  Negative for hearing loss, ear pain, congestion, neck stiffness and  postnasal drip. Negative for sore throat or swallowing problems. Negative for dental complaints.   Eyes: Negative for vision loss or change in visual acuity.  Respiratory: Negative for chest tightness and wheezing. Negative for DOE.   Cardiovascular: Negative for chest pain or palpitations. No decreased exercise tolerance Gastrointestinal: change in bowel habit, irregular. No bloating or gas. No reflux or indigestion Genitourinary: Negative for urgency, frequency, flank pain and difficulty urinating.  Musculoskeletal: Negative for myalgias, back pain, arthralgias and gait problem. Sciatica - nerve pain.  Neurological: Negative for dizziness, tremors, weakness and headaches.  Hematological: Negative for adenopathy.  Psychiatric/Behavioral: Negative for behavioral problems and dysphoric mood. Positive for anxiety.      Objective:   Physical Exam Filed Vitals:   03/31/12 0903  BP: 120/80  Pulse: 84  Temp: 98.1 F (36.7 C)  Resp: 10   Wt Readings from Last 3 Encounters:  03/31/12 150 lb (68.04 kg)  03/03/12 147 lb (66.679 kg)  12/15/10 150 lb (68.04 kg)   Gen'l: well nourished, well developed white Woman in no distress HEENT - [place]/AT, EACs/TMs normal, oropharynx with native dentition in good condition, no buccal or palatal lesions, posterior pharynx clear, mucous membranes moist. C&S clear, PERRLA, fundi - normal Neck - supple, no thyromegaly Nodes- negative submental, cervical, supraclavicular regions Chest - no deformity, no CVAT Lungs - clear without rales, wheezes. No increased work of breathing Breast - skin is normal, nipples w/o discharge, minor fibercystic changes bilateral, large cystic structure right breat 1000 position - mobile, firm, tender. Cardiovascular - regular rate and rhythm, quiet precordium, no murmurs, rubs or gallops, 2+ radial, DP and PT pulses Abdomen - BS+ x 4, no HSM, no guarding or rebound or tenderness, umbilical jewelry Pelvic - deferred to  hysterectomy Rectal - deferred to GI Extremities - no clubbing, cyanosis, edema or deformity.  Neuro - A&O x 3, CN II-XII normal, motor strength normal and equal, DTRs 2+ and symmetrical biceps, radial, and patellar tendons. Cerebellar - no tremor, no rigidity, fluid movement and normal gait. Derm - Head, neck, back, abdomen and extremities without suspicious lesions  Lab Results  Component Value Date   WBC 5.9 08/18/2010   HGB 14.3 08/18/2010   HCT 41.7 08/18/2010   PLT 201.0 08/18/2010   GLUCOSE 101* 03/31/2012   CHOL 116 03/03/2012   TRIG 83.0 03/03/2012   HDL 66.60 03/03/2012   LDLCALC 33 03/03/2012   ALT 22 03/31/2012   AST 22 03/31/2012   NA 137 03/31/2012  K 4.0 03/31/2012   CL 102 03/31/2012   CREATININE 0.8 03/31/2012   BUN 17 03/31/2012   CO2 26 03/31/2012   TSH 0.69 03/31/2012         Assessment & Plan:

## 2012-04-02 NOTE — Assessment & Plan Note (Signed)
Pain in the leg is a major complaint at today's visit. Exam does not reveal a radiculopathy.  Plan LS spine films  NSAIDs as primary treatment.  Addendum: LS Spine *RADIOLOGY REPORT*  Clinical Data: Low back pain and right leg pain.  LUMBAR SPINE - COMPLETE 4+ VIEW  Comparison: None.  Findings: There is no disc space narrowing or spondylolisthesis or  spondylolysis. No facet arthritis. Psoas margins are distinct.  Sacroiliac joints appear normal.  IMPRESSION:  Normal lumbar spine.

## 2012-04-02 NOTE — Assessment & Plan Note (Signed)
Increase anxiety that is situational in nature. Her symptoms are affecting the quality of her life and work.  Plan Sertraline daily  Continue qHS xanax  Patient to report back in 2-3 weeks as to control of symptoms

## 2012-04-02 NOTE — Assessment & Plan Note (Signed)
Interval history marked by increased situational anxiety; sciatica type pain right leg. Otherwise unremarkable. Physical exam sans pelvic unremarkable. Labs reviewed - in normal limits. She is current with colorectal and breast cancer screening. Immunizations - tetanus is current, due for pneumonia vaccine.   In summary - a very nice woman who is medically stable except for increased anxiety and mild sciatica w/o radiculopathy.

## 2012-04-02 NOTE — Assessment & Plan Note (Signed)
Lab Results  Component Value Date   TSH 0.69 03/31/2012   Adequate control on present medication/dose.   Plan Continue present regimen

## 2012-04-05 ENCOUNTER — Telehealth: Payer: Self-pay | Admitting: *Deleted

## 2012-04-05 NOTE — Telephone Encounter (Signed)
Left msg on triage requesting xray results...[name]

## 2012-04-06 NOTE — Telephone Encounter (Signed)
Normal spine

## 2012-04-06 NOTE — Telephone Encounter (Signed)
Notified pt with md response, but pt wanted to also let md know she has started the zoloft working ok but the only thing is that she is not able to sleep at night. Been taking med before she goes to bed....Raechel Chute

## 2012-06-22 ENCOUNTER — Other Ambulatory Visit: Payer: Self-pay | Admitting: Internal Medicine

## 2012-06-23 NOTE — Telephone Encounter (Signed)
Alprazolam called to pharmacy  

## 2012-07-14 ENCOUNTER — Encounter: Payer: Self-pay | Admitting: Internal Medicine

## 2012-07-29 ENCOUNTER — Encounter: Payer: Self-pay | Admitting: Internal Medicine

## 2012-09-03 ENCOUNTER — Other Ambulatory Visit: Payer: Self-pay | Admitting: Internal Medicine

## 2012-09-05 NOTE — Telephone Encounter (Signed)
Okay for Hydrocodone refill? 

## 2012-09-05 NOTE — Telephone Encounter (Signed)
Hydrocodone has been called to patient's pharmacy.

## 2012-10-17 ENCOUNTER — Encounter (HOSPITAL_COMMUNITY): Payer: Self-pay

## 2012-10-20 NOTE — Patient Instructions (Addendum)
Your procedure is scheduled on: 10/26/2012  Enter through the Main Entrance of [facility] at: 0745AM  Pick up the phone at the desk and dial [phone].  Call this number if you have problems the morning of surgery: [phone].  Remember: Do NOT eat food: AFTER MIDNIGHT 10/25/2012 Do NOT drink clear liquids after: AFTER MIDNIGHT 10/25/2012 Take these medicines the morning of surgery with a SIP OF WATER: Synthroid, Zantac  Do NOT wear jewelry, make-up, or nail polish. Do NOT wear lotions, powders, or perfumes.  You may wear deodorant. Do NOT shave for 48 hours prior to surgery. Do NOT bring valuables to the hospital. Contacts, dentures, or bridgework may not be worn into surgery. Leave suitcase in car.  After surgery it may be brought to your room.  For patients admitted to the hospital, checkout time is 11:00 AM the day of discharge.

## 2012-10-25 ENCOUNTER — Encounter (HOSPITAL_COMMUNITY): Payer: Self-pay

## 2012-10-25 ENCOUNTER — Encounter (HOSPITAL_COMMUNITY)
Admission: RE | Admit: 2012-10-25 | Discharge: 2012-10-25 | Disposition: A | Discharge status: Home / Self Care | Payer: Managed Care, Other (non HMO) | Source: Ambulatory Visit | Attending: Obstetrics and Gynecology | Admitting: Obstetrics and Gynecology

## 2012-10-25 DIAGNOSIS — R102 Pelvic and perineal pain: Secondary | ICD-10-CM

## 2012-10-25 DIAGNOSIS — N7011 Chronic salpingitis: Secondary | ICD-10-CM

## 2012-10-25 HISTORY — DX: Other specified postprocedural states: R11.2

## 2012-10-25 HISTORY — DX: Unspecified asthma, uncomplicated: J45.909

## 2012-10-25 HISTORY — DX: Anxiety disorder, unspecified: F41.9

## 2012-10-25 HISTORY — DX: Other specified postprocedural states: Z98.890

## 2012-10-25 LAB — CBC
MCV: 95.1 fL (ref 78.0–100.0)
Platelets: 209 10*3/uL (ref 150–400)
RDW: 13.3 % (ref 11.5–15.5)
WBC: 6.8 10*3/uL (ref 4.0–10.5)

## 2012-10-25 NOTE — H&P (Signed)
[name] is an 52 y.o. female. [id number] with hydrosalpinx R sided ad LLQ pain.  Hydrosalpinx on [place].  D/w pt r/b/a of laparoscopic tubal removal.  Pt s/p vaginal hysterectomy.    Pertinent Gynecological History: Sexually transmitted diseases: no past history Previous GYN Procedures: DNC and vaginal hysterectomy 10/2009  Last mammogram: normal Date: Spring 2014 Last pap: normal OB History: G3, P2012, SVD and LTCS at term   Menstrual History No LMP recorded. Patient has had a hysterectomy.    Past Medical History  Diagnosis Date  . Ganglion cyst   . Family hx of colon cancer   . Epigastric abdominal pain   . Anxiety disorder   . Esophageal stricture   . GERD (gastroesophageal reflux disease)   . DJD (degenerative joint disease)   . Hemorrhoid   . Cholelithiasis   . Hypothyroidism   . Anxiety   . Asthma     h/o asthma as a child  . PONV (postoperative nausea and vomiting)     Past Surgical History  Procedure Laterality Date  . Cholecystectomy    . Hemorrhoid surgery    . Tubal ligation    . Cesarean section    . Urologic surgery for ureteropelvic junction obstruction    . Abdominal hysterectomy    Vaginal hysterectomy? TAB  Family History  Problem Relation Age of Onset  . COPD Mother   . Diabetes Father   . Heart disease Father   . Hypertension Father   . Hypertension Sister   . Depression Sister   . Arthritis Sister   . Cancer Neg Hx     Social History:  reports that she has been smoking Cigarettes.  She has a 5 pack-year smoking history. She has never used smokeless tobacco. She reports that she drinks about 1.0 ounces of alcohol per week. She reports that she does not use illicit drugs.married  Allergies:  Allergies  Allergen Reactions  . Adhesive [Tape] Dermatitis    Pt prefers paper tape  . Promethazine Hcl Itching and Nausea And Vomiting    No prescriptions prior to admission  Meds (per office chart) Xanax, hydrocodone,levothyroxine, meloxicam,  sertraline  Review of Systems  Constitutional: Negative.   HENT: Negative.   Eyes: Negative.   Respiratory: Negative.   Cardiovascular: Negative.   Gastrointestinal: Negative.   Genitourinary: Negative.   Musculoskeletal: Negative.   Skin: Negative.   Neurological: Negative.   Psychiatric/Behavioral: Negative.     There were no vitals taken for this visit. Physical Exam  Constitutional: She is oriented to person, place, and time. She appears well-developed and well-nourished.  HENT:  Head: Normocephalic and atraumatic.  Cardiovascular: Normal rate and regular rhythm.   Respiratory: Effort normal and breath sounds normal. No respiratory distress.  GI: Soft. Bowel sounds are normal. There is no tenderness.  Musculoskeletal: Normal range of motion.  Neurological: She is alert and oriented to person, place, and time.  Skin: Skin is warm and dry.  Psychiatric: She has a normal mood and affect. Her behavior is normal.    Results for orders placed during the hospital encounter of 10/25/12 (from the past 24 hour(s))  CBC     Status: None   Collection Time    10/25/12 10:20 AM      Result Value Range   WBC 6.8  4.0 - 10.5 K/uL   RBC 4.51  3.87 - 5.11 MIL/uL   Hemoglobin 14.7  12.0 - 15.0 g/dL   HCT 57.8  46.9 - 62.9 %  MCV 95.1  78.0 - 100.0 fL   MCH 32.6  26.0 - 34.0 pg   MCHC 34.3  30.0 - 36.0 g/dL   RDW 41.3  24.4 - 01.0 %   Platelets 209  150 - 400 K/uL    No results found. US shows small benign appearing ovarian cysts, R sided hydrosalpinx  Assessment/Plan: 28OZ [id number] with hydrosalpinx for B salpingectomy secondary to pelvic pain D/w pt r/b/a inclusing but not limited to bleeding, infection, damage to surrounding organs, trouble healing, continued pain.  Pt voices understanding.    BOVARD,Chloe Flis 10/25/2012, 9:00 PM

## 2012-10-26 ENCOUNTER — Ambulatory Visit (HOSPITAL_COMMUNITY)
Admission: RE | Admit: 2012-10-26 | Discharge: 2012-10-26 | Disposition: A | Discharge status: Home / Self Care | Payer: Managed Care, Other (non HMO) | Source: Ambulatory Visit | Attending: Obstetrics and Gynecology | Admitting: Obstetrics and Gynecology

## 2012-10-26 ENCOUNTER — Encounter (HOSPITAL_COMMUNITY): Payer: Self-pay | Admitting: Anesthesiology

## 2012-10-26 ENCOUNTER — Encounter (HOSPITAL_COMMUNITY): Payer: Self-pay | Admitting: *Deleted

## 2012-10-26 ENCOUNTER — Ambulatory Visit (HOSPITAL_COMMUNITY): Payer: Managed Care, Other (non HMO) | Admitting: Anesthesiology

## 2012-10-26 ENCOUNTER — Encounter (HOSPITAL_COMMUNITY)
Admission: RE | Disposition: A | Discharge status: Home / Self Care | Payer: Self-pay | Source: Ambulatory Visit | Attending: Obstetrics and Gynecology

## 2012-10-26 DIAGNOSIS — R1032 Left lower quadrant pain: Secondary | ICD-10-CM | POA: Insufficient documentation

## 2012-10-26 DIAGNOSIS — N949 Unspecified condition associated with female genital organs and menstrual cycle: Secondary | ICD-10-CM

## 2012-10-26 DIAGNOSIS — N7011 Chronic salpingitis: Secondary | ICD-10-CM

## 2012-10-26 DIAGNOSIS — R102 Pelvic and perineal pain: Secondary | ICD-10-CM

## 2012-10-26 DIAGNOSIS — N7013 Chronic salpingitis and oophoritis: Secondary | ICD-10-CM | POA: Insufficient documentation

## 2012-10-26 DIAGNOSIS — N736 Female pelvic peritoneal adhesions (postinfective): Secondary | ICD-10-CM

## 2012-10-26 HISTORY — DX: Female pelvic peritoneal adhesions (postinfective): N73.6

## 2012-10-26 HISTORY — PX: LAPAROSCOPIC BILATERAL SALPINGECTOMY: SHX5889

## 2012-10-26 SURGERY — SALPINGECTOMY, BILATERAL, LAPAROSCOPIC
Anesthesia: General | Site: Abdomen | Wound class: Clean Contaminated

## 2012-10-26 MED ORDER — FENTANYL CITRATE 0.05 MG/ML IJ SOLN
INTRAMUSCULAR | Status: DC | PRN
  Administered 2012-10-26 (×2): 100 ug via INTRAVENOUS

## 2012-10-26 MED ORDER — OXYCODONE-ACETAMINOPHEN 5-325 MG PO TABS
ORAL_TABLET | ORAL | Status: AC
  Filled 2012-10-26: qty 1

## 2012-10-26 MED ORDER — LACTATED RINGERS IV SOLN
INTRAVENOUS | Status: DC
  Administered 2012-10-26 (×3): via INTRAVENOUS

## 2012-10-26 MED ORDER — HYDROMORPHONE HCL PF 1 MG/ML IJ SOLN
INTRAMUSCULAR | Status: AC
  Filled 2012-10-26: qty 1

## 2012-10-26 MED ORDER — FENTANYL CITRATE 0.05 MG/ML IJ SOLN
25.0000 ug | INTRAMUSCULAR | Status: DC | PRN
  Administered 2012-10-26: 50 ug via INTRAVENOUS

## 2012-10-26 MED ORDER — DEXAMETHASONE SODIUM PHOSPHATE 10 MG/ML IJ SOLN
INTRAMUSCULAR | Status: AC
  Filled 2012-10-26: qty 1

## 2012-10-26 MED ORDER — KETOROLAC TROMETHAMINE 30 MG/ML IJ SOLN
INTRAMUSCULAR | Status: DC | PRN
  Administered 2012-10-26: 30 mg via INTRAVENOUS

## 2012-10-26 MED ORDER — GLYCOPYRROLATE 0.2 MG/ML IJ SOLN
INTRAMUSCULAR | Status: DC | PRN
  Administered 2012-10-26: .4 mg via INTRAVENOUS

## 2012-10-26 MED ORDER — DEXAMETHASONE SODIUM PHOSPHATE 10 MG/ML IJ SOLN
INTRAMUSCULAR | Status: DC | PRN
  Administered 2012-10-26: 10 mg via INTRAVENOUS

## 2012-10-26 MED ORDER — SCOPOLAMINE 1 MG/3DAYS TD PT72
1.0000 | MEDICATED_PATCH | TRANSDERMAL | Status: DC
  Administered 2012-10-26: 1.5 mg via TRANSDERMAL

## 2012-10-26 MED ORDER — NEOSTIGMINE METHYLSULFATE 1 MG/ML IJ SOLN
INTRAMUSCULAR | Status: DC | PRN
  Administered 2012-10-26: 3 mg via INTRAVENOUS

## 2012-10-26 MED ORDER — LIDOCAINE HCL (CARDIAC) 20 MG/ML IV SOLN
INTRAVENOUS | Status: DC | PRN
  Administered 2012-10-26: 80 mg via INTRAVENOUS

## 2012-10-26 MED ORDER — FENTANYL CITRATE 0.05 MG/ML IJ SOLN
INTRAMUSCULAR | Status: AC
  Filled 2012-10-26: qty 2

## 2012-10-26 MED ORDER — ONDANSETRON HCL 4 MG/2ML IJ SOLN
INTRAMUSCULAR | Status: DC | PRN
  Administered 2012-10-26: 4 mg via INTRAVENOUS

## 2012-10-26 MED ORDER — PROPOFOL 10 MG/ML IV BOLUS
INTRAVENOUS | Status: DC | PRN
  Administered 2012-10-26: 200 mg via INTRAVENOUS
  Administered 2012-10-26: 100 mg via INTRAVENOUS

## 2012-10-26 MED ORDER — OXYCODONE-ACETAMINOPHEN 5-325 MG PO TABS: 1.0000 | ORAL_TABLET | Freq: Four times a day (QID) | ORAL | Status: DC | PRN

## 2012-10-26 MED ORDER — MIDAZOLAM HCL 2 MG/2ML IJ SOLN
INTRAMUSCULAR | Status: DC | PRN
  Administered 2012-10-26: 2 mg via INTRAVENOUS

## 2012-10-26 MED ORDER — PROPOFOL 10 MG/ML IV EMUL
INTRAVENOUS | Status: AC
  Filled 2012-10-26: qty 20

## 2012-10-26 MED ORDER — ONDANSETRON HCL 4 MG/2ML IJ SOLN
INTRAMUSCULAR | Status: AC
  Filled 2012-10-26: qty 2

## 2012-10-26 MED ORDER — BUPIVACAINE HCL (PF) 0.25 % IJ SOLN
INTRAMUSCULAR | Status: DC | PRN
  Administered 2012-10-26: 10 mL

## 2012-10-26 MED ORDER — SCOPOLAMINE 1 MG/3DAYS TD PT72
MEDICATED_PATCH | TRANSDERMAL | Status: AC
  Administered 2012-10-26: 1.5 mg via TRANSDERMAL
  Filled 2012-10-26: qty 1

## 2012-10-26 MED ORDER — KETOROLAC TROMETHAMINE 30 MG/ML IJ SOLN
INTRAMUSCULAR | Status: AC
  Filled 2012-10-26: qty 1

## 2012-10-26 MED ORDER — FENTANYL CITRATE 0.05 MG/ML IJ SOLN
INTRAMUSCULAR | Status: AC
  Administered 2012-10-26: 50 ug via INTRAVENOUS
  Filled 2012-10-26: qty 2

## 2012-10-26 MED ORDER — ROCURONIUM BROMIDE 50 MG/5ML IV SOLN
INTRAVENOUS | Status: AC
  Filled 2012-10-26: qty 1

## 2012-10-26 MED ORDER — HYDROMORPHONE HCL PF 1 MG/ML IJ SOLN
INTRAMUSCULAR | Status: DC | PRN
  Administered 2012-10-26 (×2): 1 mg via INTRAVENOUS

## 2012-10-26 MED ORDER — GLYCOPYRROLATE 0.2 MG/ML IJ SOLN
INTRAMUSCULAR | Status: AC
  Filled 2012-10-26: qty 2

## 2012-10-26 MED ORDER — ROCURONIUM BROMIDE 100 MG/10ML IV SOLN
INTRAVENOUS | Status: DC | PRN
  Administered 2012-10-26: 10 mg via INTRAVENOUS
  Administered 2012-10-26: 35 mg via INTRAVENOUS
  Administered 2012-10-26 (×2): 10 mg via INTRAVENOUS

## 2012-10-26 MED ORDER — BUPIVACAINE HCL (PF) 0.25 % IJ SOLN
INTRAMUSCULAR | Status: AC
  Filled 2012-10-26: qty 30

## 2012-10-26 MED ORDER — NEOSTIGMINE METHYLSULFATE 1 MG/ML IJ SOLN
INTRAMUSCULAR | Status: AC
  Filled 2012-10-26: qty 1

## 2012-10-26 MED ORDER — OXYCODONE-ACETAMINOPHEN 5-325 MG PO TABS
1.0000 | ORAL_TABLET | ORAL | Status: DC | PRN
  Administered 2012-10-26: 1 via ORAL

## 2012-10-26 MED ORDER — LIDOCAINE HCL (CARDIAC) 20 MG/ML IV SOLN
INTRAVENOUS | Status: AC
  Filled 2012-10-26: qty 5

## 2012-10-26 SURGICAL SUPPLY — 25 items
ADH SKN CLS APL DERMABOND .7 (GAUZE/BANDAGES/DRESSINGS) ×1
BAG SPEC RTRVL LRG 6X4 10 (ENDOMECHANICALS)
CABLE HIGH FREQUENCY MONO STRZ (ELECTRODE) IMPLANT
CATH ROBINSON RED A/P 16FR (CATHETERS) ×2 IMPLANT
CHLORAPREP W/TINT 26ML (MISCELLANEOUS) ×2 IMPLANT
CLOTH BEACON ORANGE TIMEOUT ST (SAFETY) ×2 IMPLANT
DERMABOND ADVANCED (GAUZE/BANDAGES/DRESSINGS) ×1
DERMABOND ADVANCED .7 DNX12 (GAUZE/BANDAGES/DRESSINGS) ×1 IMPLANT
GLOVE BIO SURGEON STRL SZ 6.5 (GLOVE) ×2 IMPLANT
GLOVE BIO SURGEON STRL SZ7 (GLOVE) ×2 IMPLANT
GOWN PREVENTION PLUS LG XLONG (DISPOSABLE) ×6 IMPLANT
NEEDLE INSUFFLATION 120MM (ENDOMECHANICALS) ×2 IMPLANT
NS IRRIG 1000ML POUR BTL (IV SOLUTION) ×2 IMPLANT
PACK LAPAROSCOPY BASIN (CUSTOM PROCEDURE TRAY) ×2 IMPLANT
POUCH SPECIMEN RETRIEVAL 10MM (ENDOMECHANICALS) IMPLANT
PROTECTOR NERVE ULNAR (MISCELLANEOUS) ×2 IMPLANT
SCALPEL HARMONIC ACE (MISCELLANEOUS) ×2 IMPLANT
SET IRRIG TUBING LAPAROSCOPIC (IRRIGATION / IRRIGATOR) IMPLANT
SUT VICRYL 0 UR6 27IN ABS (SUTURE) IMPLANT
SUT VICRYL 4-0 PS2 18IN ABS (SUTURE) ×2 IMPLANT
TOWEL OR 17X24 6PK STRL BLUE (TOWEL DISPOSABLE) ×4 IMPLANT
TROCAR XCEL NON-BLD 11X100MML (ENDOMECHANICALS) ×2 IMPLANT
TROCAR XCEL NON-BLD 5MMX100MML (ENDOMECHANICALS) ×4 IMPLANT
WARMER LAPAROSCOPE (MISCELLANEOUS) ×2 IMPLANT
WATER STERILE IRR 1000ML POUR (IV SOLUTION) ×2 IMPLANT

## 2012-10-26 NOTE — Transfer of Care (Signed)
Immediate Anesthesia Transfer of Care Note  Patient: [name]  Procedure(s) Performed: Procedure(s): operative laparoscopy with lysis of adhesions (N/A)  Patient Location: PACU  Anesthesia Type:General  Level of Consciousness: awake, alert  and oriented  Airway & Oxygen Therapy: Patient Spontanous Breathing and Patient connected to nasal cannula oxygen  Post-op Assessment: Report given to PACU RN and Post -op Vital signs reviewed and stable  Post vital signs: stable  Complications: No apparent anesthesia complications

## 2012-10-26 NOTE — Interval H&P Note (Signed)
History and Physical Interval Note:  10/26/2012 8:35 AM  [name]  has presented today for surgery, with the diagnosis of Salpingitis/Oophoritis, [phone]  The various methods of treatment have been discussed with the patient and family. After consideration of risks, benefits and other options for treatment, the patient has consented to  Procedure(s): LAPAROSCOPIC BILATERAL SALPINGECTOMY (N/A) as a surgical intervention .  The patient's history has been reviewed, patient examined, no change in status, stable for surgery.  I have reviewed the patient's chart and labs.  Questions were answered to the patient's satisfaction.     BOVARD,Corbyn Steedman

## 2012-10-26 NOTE — Op Note (Signed)
NAMEMENDI, CONSTABLE NO.:  [id number]  MEDICAL RECORD NO.:  [id number]  LOCATION:  [facility]                          FACILITY:  [facility]  PHYSICIAN:  Sherron Monday, MD        DATE OF BIRTH:  Mar 02, 1960  DATE OF PROCEDURE:  10/26/2012 DATE OF DISCHARGE:  10/26/2012                              OPERATIVE REPORT   PREOPERATIVE DIAGNOSIS:  Hydrosalpinx, pelvic pain.  POSTOPERATIVE DIAGNOSIS:  Pelvic adhesions and bowel adhesions.  PROCEDURE:  Operative laparoscopy with lysis of adhesions.  SURGEON:  Sherron Monday, MD  ASSISTANT:  Shelbie Proctor. Shawnie Pons, MD  ANESTHESIA:  General.  IV FLUIDS:  2000 mL.  URINE OUTPUT:  250 mL.  ESTIMATED BLOOD LOSS:  Minimal.  COMPLICATIONS:  Extensive dense pelvic adhesions to sidewall and cuff of the bowel epiploicae and peritoneum.  PATHOLOGY:  None.  DESCRIPTION OF PROCEDURE:  After informed consent was reviewed with the patient including risks, benefits, and alternatives of the surgical procedure, she was transported to the OR in a stable position.  General anesthesia was induced, found to be adequate.  She was then placed in the [place] stirrups, prepped and draped in the normal sterile fashion. Her bladder was sterilely drained.  An infraumbilical incision of 5 mm was made, and after the hanging drop test confirmed intraperitoneal placement, the peritoneal cavity was insufflated.  A brief pelvic survey performed revealed the dense adhesions.  Accessory ports were placed both on the right and left.  On the left side, approximately 10-mm incision was made, so that we could remove through the EndoCatch bag if necessary.  The bowel epiploicae adhesed to the left side.  These were lysed and the ovary on the left and right were both identified.  Bowel epiploicae were noted to be densely adhesed to the cuff.  This was also lysed.  The right ovary was also identified as well as the fimbriae of the partial right tube.  It was noted not to be  a hydrosalpinx as was shown on ultrasound.  The decision was made, secondary to the dense adhesions in the area of the ureter, not try to remove this tube as the bowel had been adhesed and these adhesions were lysed.  The thought was this would take care of her mainly left-sided pain.  The right ovary was noted to have a simple cyst as well.  The pedicles were noted to be hemostatic.  The bowel was peristalsing normally.  The patient tolerated the procedure well.  Sponge, lap, and needle count was correct x2 per the operating staff.  The patient was awakened in stable condition and transported to the PACU.     Sherron Monday, MD     JB/MEDQ  D:  10/26/2012  T:  10/26/2012  Job:  [id number]

## 2012-10-26 NOTE — Brief Op Note (Signed)
10/26/2012  10:50 AM  PATIENT:  [name]  52 y.o. female  PRE-OPERATIVE DIAGNOSIS:  Salpingitis/Oophoritis, [phone], pelvic pain  POST-OPERATIVE DIAGNOSIS: pelvic adhesions, bowel adhesions  PROCEDURE:  Procedure(s): operative laparoscopy with lysis of adhesions (N/A)  SURGEON:  Surgeon(s) and Role:    * Sherron Monday, MD - Primary    * Reva Bores, MD - Assisting  ANESTHESIA:   general  EBL:  Total I/O In: 2100 [I.V.:2100] Out: 255 [Urine:250; Blood:5]  BLOOD ADMINISTERED:none  DRAINS: none   LOCAL MEDICATIONS USED:  BUPIVICAINE   SPECIMEN:  No Specimen  DISPOSITION OF SPECIMEN:  N/A  COUNTS:  YES  TOURNIQUET:  * No tourniquets in log *  DICTATION: .Other Dictation: Dictation Number [id number]  PLAN OF CARE: Discharge to home after PACU  PATIENT DISPOSITION:  PACU - hemodynamically stable.   Delay start of Pharmacological VTE agent (>24hrs) due to surgical blood loss or risk of bleeding: not applicable

## 2012-10-26 NOTE — Anesthesia Preprocedure Evaluation (Addendum)
Anesthesia Evaluation  Patient identified by MRN, date of birth, ID band Patient awake    Reviewed: Allergy & Precautions, H&P , Patient's Chart, lab work & pertinent test results, reviewed documented beta blocker date and time   History of Anesthesia Complications (+) PONV  Airway Mallampati: II TM Distance: >3 FB Neck ROM: full    Dental no notable dental hx.    Pulmonary neg sleep apnea,  breath sounds clear to auscultation  Pulmonary exam normal       Cardiovascular Rhythm:regular Rate:Normal     Neuro/Psych PSYCHIATRIC DISORDERS Anxious about O2 Mask   GI/Hepatic GERD-  Medicated and Controlled,  Endo/Other    Renal/GU      Musculoskeletal   Abdominal   Peds  Hematology   Anesthesia Other Findings   Reproductive/Obstetrics                          Anesthesia Physical Anesthesia Plan  ASA: II  Anesthesia Plan: General   Post-op Pain Management:    Induction: Intravenous  Airway Management Planned: Oral ETT  Additional Equipment:   Intra-op Plan:   Post-operative Plan: Extubation in OR  Informed Consent: I have reviewed the patients History and Physical, chart, labs and discussed the procedure including the risks, benefits and alternatives for the proposed anesthesia with the patient or authorized representative who has indicated his/her understanding and acceptance.   Dental Advisory Given and Dental advisory given  Plan Discussed with: CRNA and Surgeon  Anesthesia Plan Comments: (  Discussed general anesthesia, including possible nausea, instrumentation of airway, sore throat,pulmonary aspiration, etc. I asked if the were any outstanding questions, or  concerns before we proceeded. )        Anesthesia Quick Evaluation

## 2012-10-26 NOTE — Anesthesia Postprocedure Evaluation (Signed)
  Anesthesia Post-op Note  Patient: [name]  Procedure(s) Performed: Procedure(s): operative laparoscopy with lysis of adhesions (N/A) Patient is awake and responsive. Pain and nausea are reasonably well controlled. Vital signs are stable and clinically acceptable. Oxygen saturation is clinically acceptable. There are no apparent anesthetic complications at this time. Patient is ready for discharge.

## 2012-10-27 ENCOUNTER — Encounter (HOSPITAL_COMMUNITY): Payer: Self-pay | Admitting: Obstetrics and Gynecology

## 2012-11-02 ENCOUNTER — Other Ambulatory Visit: Payer: Self-pay | Admitting: Internal Medicine

## 2013-01-12 ENCOUNTER — Other Ambulatory Visit: Payer: Self-pay | Admitting: Internal Medicine

## 2013-01-16 ENCOUNTER — Ambulatory Visit: Payer: Managed Care, Other (non HMO) | Admitting: Internal Medicine

## 2013-01-18 ENCOUNTER — Ambulatory Visit: Payer: Managed Care, Other (non HMO) | Admitting: Internal Medicine

## 2013-02-20 ENCOUNTER — Other Ambulatory Visit: Payer: Self-pay | Admitting: Internal Medicine

## 2013-03-17 ENCOUNTER — Other Ambulatory Visit: Payer: Self-pay | Admitting: Internal Medicine

## 2013-04-10 ENCOUNTER — Ambulatory Visit (INDEPENDENT_AMBULATORY_CARE_PROVIDER_SITE_OTHER): Payer: Managed Care, Other (non HMO) | Admitting: Internal Medicine

## 2013-04-10 ENCOUNTER — Other Ambulatory Visit (INDEPENDENT_AMBULATORY_CARE_PROVIDER_SITE_OTHER): Payer: Managed Care, Other (non HMO)

## 2013-04-10 ENCOUNTER — Encounter: Payer: Self-pay | Admitting: Internal Medicine

## 2013-04-10 VITALS — BP 130/90 | HR 83 | Temp 98.3°F | Ht 66.0 in | Wt 152.2 lb

## 2013-04-10 DIAGNOSIS — K219 Gastro-esophageal reflux disease without esophagitis: Secondary | ICD-10-CM

## 2013-04-10 DIAGNOSIS — E039 Hypothyroidism, unspecified: Secondary | ICD-10-CM

## 2013-04-10 DIAGNOSIS — F411 Generalized anxiety disorder: Secondary | ICD-10-CM

## 2013-04-10 DIAGNOSIS — R221 Localized swelling, mass and lump, neck: Secondary | ICD-10-CM

## 2013-04-10 DIAGNOSIS — K222 Esophageal obstruction: Secondary | ICD-10-CM

## 2013-04-10 DIAGNOSIS — M79609 Pain in unspecified limb: Secondary | ICD-10-CM

## 2013-04-10 DIAGNOSIS — Z0000 Encounter for general adult medical examination without abnormal findings: Secondary | ICD-10-CM

## 2013-04-10 DIAGNOSIS — R220 Localized swelling, mass and lump, head: Secondary | ICD-10-CM

## 2013-04-10 DIAGNOSIS — N736 Female pelvic peritoneal adhesions (postinfective): Secondary | ICD-10-CM

## 2013-04-10 DIAGNOSIS — M79601 Pain in right arm: Secondary | ICD-10-CM

## 2013-04-10 DIAGNOSIS — M129 Arthropathy, unspecified: Secondary | ICD-10-CM

## 2013-04-10 LAB — BASIC METABOLIC PANEL
BUN: 10 mg/dL (ref 6–23)
CALCIUM: 9.4 mg/dL (ref 8.4–10.5)
CO2: 29 mEq/L (ref 19–32)
Chloride: 104 mEq/L (ref 96–112)
Creatinine, Ser: 0.9 mg/dL (ref 0.4–1.2)
GFR: 73.81 mL/min (ref >60.00)
GLUCOSE: 97 mg/dL (ref 70–99)
Potassium: 3.7 mEq/L (ref 3.5–5.1)
SODIUM: 141 meq/L (ref 135–145)

## 2013-04-10 LAB — TSH: TSH: 0.45 u[IU]/mL (ref 0.35–5.50)

## 2013-04-10 LAB — HEMOGLOBIN AND HEMATOCRIT, BLOOD
HCT: 47.1 % — ABNORMAL HIGH (ref 36.0–46.0)
HEMOGLOBIN: 15.4 g/dL — AB (ref 12.0–15.0)

## 2013-04-10 MED ORDER — ALPRAZOLAM 1 MG PO TABS: 1.0000 mg | ORAL_TABLET | Freq: Three times a day (TID) | ORAL | Status: DC

## 2013-04-10 NOTE — Progress Notes (Signed)
Subjective:    Patient ID: [name], female    DOB: [birth year], 53 y.o.   MRN: [id number]  HPI [name] presents for general wellness exam. She is under greater stress: husband has lost his job, daughter getting married. She does well with Xanax but needs to increase frequency. She has been intolerant of Sertraline.   Her arthritis is flaring. She is having pain in the right arm that runs from neck to hand with dense paresthesias. She also has paresthesia in the right leg. Reviewed Lumbar spine films - no DDD. She has shoulder films. Did discuss the use of NSAIDs and the need to not combine products. OK to take APAP on a regular basis not to exceed 3,000mg  daily - must count the APAP in [place].   She is current is gyn. Current with mammography. Due for colonoscopy.  Past Medical History  Diagnosis Date  . Ganglion cyst   . Family hx of colon cancer   . Epigastric abdominal pain   . Anxiety disorder   . Esophageal stricture   . GERD (gastroesophageal reflux disease)   . DJD (degenerative joint disease)   . Hemorrhoid   . Cholelithiasis   . Hypothyroidism   . Anxiety   . Asthma     h/o asthma as a child  . PONV (postoperative nausea and vomiting)   . Female pelvic peritoneal adhesions 10/26/2012   Past Surgical History  Procedure Laterality Date  . Cholecystectomy    . Hemorrhoid surgery    . Tubal ligation    . Cesarean section    . Urologic surgery for ureteropelvic junction obstruction    . Abdominal hysterectomy    . Laparoscopic bilateral salpingectomy N/A 10/26/2012    Procedure: operative laparoscopy with lysis of adhesions;  Surgeon: Thornell Sartorius, MD;  Location: [facility];  Service: Gynecology;  Laterality: N/A;   Family History  Problem Relation Age of Onset  . COPD Mother   . Diabetes Father   . Heart disease Father   . Hypertension Father   . Hypertension Sister   . Depression Sister   . Arthritis Sister   . Cancer Neg Hx    History   Social History    . Marital Status: Married    Spouse Name: N/A    Number of Children: 2  . Years of Education: 14   Occupational History  . auditor, Passenger transport manager [place] History Main Topics  . Smoking status: Current Every Day Smoker -- 0.50 packs/day for 10 years    Types: Cigarettes  . Smokeless tobacco: Never Used  . Alcohol Use: 1.0 oz/week    2 drink(s) per week     Comment: occasionally  . Drug Use: No  . Sexual Activity: Yes    Partners: Male   Other Topics Concern  . Not on file   Social History Narrative   HSG, some community college. .Married -'81.   2 daughters  '83, '88 daughter  with bipolar dz. Has had behavior issues. 1 granddaughter '05 living with her. Occupation:Bank worker. Marriage in good. No history of abuse.                   Current Outpatient Prescriptions on File Prior to Visit  Medication Sig Dispense Refill  . ALPRAZolam (XANAX) 1 MG tablet TAKE 1 TABLET TWICE A DAY DUE 5/20  180 tablet  1  . Cholecalciferol (GNP VITAMIN D) 1000 UNITS tablet Take 1,000 Units  by mouth daily.        Marland Kitchen HYDROcodone-acetaminophen (NORCO/VICODIN) 5-325 MG per tablet TAKE 1 TABLET BY MOUTH EVERY 6 HOURS AS NEEDED FOR PAIN  120 tablet  3  . levothyroxine (SYNTHROID, LEVOTHROID) 50 MCG tablet TAKE 1 TABLET BY MOUTH DAILY  90 tablet  3  . meloxicam (MOBIC) 15 MG tablet TAKE 1 TABLET EVERY DAY  90 tablet  3  . ranitidine (ZANTAC) 150 MG tablet Take 150 mg by mouth 2 (two) times daily.      . sertraline (ZOLOFT) 50 MG tablet TAKE 1 TABLET EVERY DAY  30 tablet  11   No current facility-administered medications on file prior to visit.      Review of Systems Constitutional:  Negative for fever, chills, activity change and unexpected weight change.  HEENT:  Negative for hearing loss, ear pain, congestion, neck stiffness and postnasal drip. Negative for sore throat or swallowing problems. Negative for dental complaints.   Eyes: Negative for vision loss or change in  visual acuity.  Respiratory: Negative for chest tightness and wheezing. Negative for DOE.   Cardiovascular: Negative for chest pain or palpitations. No decreased exercise tolerance Gastrointestinal: No change in bowel habit. No bloating or gas.  reflux has been acting up but no indigestion Genitourinary: Negative for urgency, frequency, flank pain and difficulty urinating.  Musculoskeletal: Positive for myalgias, back pain, arthralgias and gait problem.  Neurological: Negative for dizziness, tremors, weakness and headaches.  Hematological: Negative for adenopathy.  Psychiatric/Behavioral: Negative for behavioral problems and dysphoric mood.       Objective:   Physical Exam Filed Vitals:   04/10/13 0949  BP: 130/90  Pulse: 83  Temp: 98.3 F (36.8 C)   Wt Readings from Last 3 Encounters:  04/10/13 152 lb 3.2 oz (69.037 kg)  10/25/12 154 lb (69.854 kg)  03/31/12 150 lb (68.04 kg)   Gen'l: well nourished, well developed Woman in no distress HEENT - [place]/AT, EACs/TMs normal, oropharynx with native dentition in good condition, no buccal or palatal lesions, posterior pharynx clear, mucous membranes moist. C&S clear, PERRLA, fundi - normal Neck - supple, no thyromegaly, 1 cm nodule just right of midline, mobile, non tender. Nodes- negative submental, cervical, supraclavicular regions Chest - no deformity, no CVAT Lungs - clear without rales, wheezes. No increased work of breathing Breast - deferred to Gyn Cardiovascular - regular rate and rhythm, quiet precordium, no murmurs, rubs or gallops, 2+ radial, DP and PT pulses Abdomen - BS+ x 4, no HSM, no guarding or rebound or tenderness Pelvic - deferred to gyn Rectal - deferred to gyn Extremities - no clubbing, cyanosis, edema or deformity. Negative Tinel's and Phalen's sign.  Neuro - A&O x 3, CN II-XII normal, motor strength normal and equal, DTRs 2+ and symmetrical biceps, radial, and patellar tendons. Cerebellar - no tremor, no rigidity,  fluid movement and normal gait. Derm - Head, neck, back, abdomen and extremities without suspicious lesions  Recent Results (from the past 2160 hour(s))  HEMOGLOBIN AND HEMATOCRIT, BLOOD     Status: Abnormal   Collection Time    04/10/13 11:36 AM      Result Value Ref Range   Hemoglobin 15.4 (*) 12.0 - 15.0 g/dL   HCT 47.1 (*) 36.0 - 46.0 %  TSH     Status: None   Collection Time    04/10/13 11:36 AM      Result Value Ref Range   TSH 0.45  0.35 - 5.50 uIU/mL  BASIC METABOLIC  PANEL     Status: None   Collection Time    04/10/13 11:36 AM      Result Value Ref Range   Sodium 141  135 - 145 mEq/L   Potassium 3.7  3.5 - 5.1 mEq/L   Chloride 104  96 - 112 mEq/L   CO2 29  19 - 32 mEq/L   Glucose, Bld 97  70 - 99 mg/dL   BUN 10  6 - 23 mg/dL   Creatinine, Ser 0.9  0.4 - 1.2 mg/dL   Calcium 9.4  8.4 - 10.5 mg/dL   GFR 73.81  >60.00 mL/min          Assessment & Plan:  1. Nodule anterior neck - suspect a benign lesion but patient reports increase in size over the past year. Plan U/S soft tissue neck  2. Right Arm pain - no evidence of carpal tunnel syndrome. No radicular findings on exam. Suspect DJD c-spine with discomfort 2/2 compression C3-C6. Plan c-spine series, if positive for significant DJD/DDD will need MRI to guide possible injection therapy.

## 2013-04-10 NOTE — Progress Notes (Signed)
Pre visit review using our clinic review tool, if applicable. No additional management support is needed unless otherwise documented below in the visit note. 

## 2013-04-10 NOTE — Patient Instructions (Signed)
Thanks for coming to see me all these years. I have valued your confidence.  Your exam is ok but there is that nodule on your trachea - will get an U/S to be sure of what is going on.  Anxiety - at this time will stay with the Xanax but increase to 3 times a day.  Arthritic pain - please do not double up on the anti-inflammatory drugs - stay with the meloxicam. It is ok to take up to 3,000 mg a day of APAP, and you must count the 325 mg APAP in every norco tablet.  Arm pain and tingling - I am concerned about your neck and whether this is the source of discomfort. Will check neck x-rays today. Please request your ortho doc to send imaging reports to [place].   Stomach issues - please take the Zantac 150 mg twice a day. If things are going really well you can back off to once a day.  Health maintenance - please have your PAP reports sent to [place]. You are current with immunizations. You need to get that colonoscopy.   Future care - we are happy to continue to care for you. At the [facility] office I would recommend Dr. Glori Bickers or [place]. If you see Ms. Turner we will be happy to send her records.

## 2013-04-11 NOTE — Assessment & Plan Note (Signed)
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 She usually gets relief with NSAIDs - she is advised to not double up on products due to increased risk of renal damage and GI bleeding.  Plan Continue NSAIDs  May take up to 3,000 mg daily of APAP -cautioned to include mg APAP in norco  Norco 5/325 q 6 prn

## 2013-04-11 NOTE — Assessment & Plan Note (Signed)
Many stressors. She did fail SSRI-sertraline. Has tried other products in the past (cannot retrieve from [facility]) but does get good relief with alprazolam. She cannot afford Xanax XR.  Plan Increase alprazolam 1 mg to tid. Rx provided

## 2013-04-11 NOTE — Assessment & Plan Note (Signed)
[name] reports frequent breakthrough discomfort. She admits to using Zantac once daily.  Plan Zantac 150 mg AM and HS  If symptoms not controlled will advance to PPI therapy.

## 2013-04-11 NOTE — Assessment & Plan Note (Signed)
Interval history notable for pelvic surgery Sept '14. She has had no other major illness, surgery or injury. Physical exam, sans breast and pelvic, is normal. Labs reviewed - in normal range. She is current with gyn care, colo-rectal cancer screening and breast cancer screening. Immunizations - tetanus is current.  In summary A very nice woman who is medically stable except for increased anxiety. For continue primary care she is referred to Dr. Valorie Roosevelt at [facility] although she may choose to see Ms. Toy Cookey, NP in [place].

## 2013-04-11 NOTE — Assessment & Plan Note (Signed)
No dysphagia or other complaints at this time  Plan H2 blocker therapy BID

## 2013-04-11 NOTE — Assessment & Plan Note (Signed)
Did well with surgery. Has had less pelvic pain.  Plan Follow up with Dr. Kara Mead.

## 2013-04-13 ENCOUNTER — Ambulatory Visit
Admission: RE | Admit: 2013-04-13 | Discharge: 2013-04-13 | Disposition: A | Discharge status: Home / Self Care | Payer: Managed Care, Other (non HMO) | Source: Ambulatory Visit | Attending: Internal Medicine | Admitting: Internal Medicine

## 2013-04-13 DIAGNOSIS — R221 Localized swelling, mass and lump, neck: Secondary | ICD-10-CM

## 2013-04-16 ENCOUNTER — Encounter: Payer: Self-pay | Admitting: Internal Medicine

## 2013-04-19 ENCOUNTER — Telehealth: Payer: Self-pay | Admitting: *Deleted

## 2013-04-19 NOTE — Telephone Encounter (Signed)
Phoned and notified patient results were mailed and relayed MD's response for results.

## 2013-04-19 NOTE — Telephone Encounter (Signed)
Patient phoned requesting results from ultrasound performed 04/13/13.  CB# [phone]

## 2013-04-19 NOTE — Telephone Encounter (Signed)
Letter done 2/22 and mailed 2/23. Benign nodule

## 2013-05-05 ENCOUNTER — Encounter: Payer: Self-pay | Admitting: Internal Medicine

## 2013-08-02 ENCOUNTER — Encounter: Payer: Self-pay | Admitting: Internal Medicine

## 2013-09-28 ENCOUNTER — Ambulatory Visit (INDEPENDENT_AMBULATORY_CARE_PROVIDER_SITE_OTHER): Payer: Managed Care, Other (non HMO) | Admitting: Physician Assistant

## 2013-09-28 ENCOUNTER — Encounter: Payer: Self-pay | Admitting: Physician Assistant

## 2013-09-28 VITALS — BP 132/82 | HR 84 | Temp 98.2°F | Resp 18 | Wt 147.0 lb

## 2013-09-28 DIAGNOSIS — F4321 Adjustment disorder with depressed mood: Secondary | ICD-10-CM

## 2013-09-28 MED ORDER — ESCITALOPRAM OXALATE 10 MG PO TABS: 10.0000 mg | ORAL_TABLET | Freq: Every day | ORAL | Status: DC

## 2013-09-28 NOTE — Progress Notes (Signed)
Subjective:    Patient ID: [name], female    DOB: Dec 11, 1960, 53 y.o.   MRN: [id number]  HPI Patient is a 53 y.o. female presenting for grief following the death of her father. She states that her father died last week. She has been extremely distraught over this. She used to take Zoloft for anxiety, however states that this is keeping her stomach irritation and so she has not taken it for some time. She also has a prescription of Xanax which she takes 2-3 times a day as needed her prescription. She states that she has having difficulty sleeping due to anxiety, and having a hard time making it through the day without crying, which she feels will prevent her from working. She states that she is the executor of her father's will, and while her work will provide her time off to handle this and to grieve, she needs a doctor's approval to do so. She is requesting a proximally 6 weeks off from work with [employer]. She is also requesting an alternative to Zoloft if possible. She states that she does not want to become dependent on Xanax. She denies fevers, chills, nausea, vomiting, diarrhea, shortness of breath, chest pain, headache, syncope, hopelessness, helplessness, suicidal or homicidal ideation..    Review of Systems As per HPI and are otherwise negative.   Past Medical History  Diagnosis Date  . Ganglion cyst   . Family hx of colon cancer   . Epigastric abdominal pain   . Anxiety disorder   . Esophageal stricture   . GERD (gastroesophageal reflux disease)   . DJD (degenerative joint disease)   . Hemorrhoid   . Cholelithiasis   . Hypothyroidism   . Anxiety   . Asthma     h/o asthma as a child  . PONV (postoperative nausea and vomiting)   . Female pelvic peritoneal adhesions 10/26/2012    History   Social History  . Marital Status: Married    Spouse Name: N/A    Number of Children: 2  . Years of Education: 14   Occupational History  . 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 Smoking status: Current Every Day Smoker -- 0.50 packs/day for 10 years    Types: Cigarettes  . Smokeless tobacco: Never Used  . Alcohol Use: 1.0 oz/week    2 drink(s) per week     Comment: occasionally  . Drug Use: No  . Sexual Activity: Yes    Partners: Male   Other Topics Concern  . Not on file   Social History Narrative   HSG, some community college. .Married -'81.   2 daughters  '83, '88 daughter  with bipolar dz. Has had behavior issues. 1 granddaughter '05 living with her. Occupation:Bank worker. Marriage in good. No history of abuse.                   Past Surgical History  Procedure Laterality Date  . Cholecystectomy    . Hemorrhoid surgery    . Tubal ligation    . Cesarean section    . Urologic surgery for ureteropelvic junction obstruction    . Abdominal hysterectomy    . Laparoscopic bilateral salpingectomy N/A 10/26/2012    Procedure: operative laparoscopy with lysis of adhesions;  Surgeon: Thornell Sartorius, MD;  Location: [facility];  Service: Gynecology;  Laterality: N/A;    Family History  Problem Relation Age of Onset  . COPD Mother   .  Diabetes Father   . Heart disease Father   . Hypertension Father   . Hypertension Sister   . Depression Sister   . Arthritis Sister   . Cancer Neg Hx     Allergies  Allergen Reactions  . Adhesive [Tape] Dermatitis    Pt prefers paper tape  . Promethazine Hcl Itching and Nausea And Vomiting    Current Outpatient Prescriptions on File Prior to Visit  Medication Sig Dispense Refill  . ALPRAZolam (XANAX) 1 MG tablet Take 1 tablet (1 mg total) by mouth 3 (three) times daily.  270 tablet  1  . Cholecalciferol (GNP VITAMIN D) 1000 UNITS tablet Take 1,000 Units by mouth daily.        Marland Kitchen HYDROcodone-acetaminophen (NORCO/VICODIN) 5-325 MG per tablet TAKE 1 TABLET BY MOUTH EVERY 6 HOURS AS NEEDED FOR PAIN  120 tablet  3  . levothyroxine (SYNTHROID, LEVOTHROID) 50 MCG tablet TAKE 1 TABLET BY  MOUTH DAILY  90 tablet  3  . meloxicam (MOBIC) 15 MG tablet TAKE 1 TABLET EVERY DAY  90 tablet  3  . ranitidine (ZANTAC) 150 MG tablet Take 150 mg by mouth 2 (two) times daily.       No current facility-administered medications on file prior to visit.    EXAM: BP 132/82  Pulse 84  Temp(Src) 98.2 F (36.8 C) (Oral)  Resp 18  Wt 147 lb (66.679 kg)     Objective:   Physical Exam  Nursing note and vitals reviewed. Constitutional: She is oriented to person, place, and time. She appears well-developed and well-nourished. No distress.  HENT:  Head: Normocephalic and atraumatic.  Cardiovascular: Normal rate, regular rhythm and intact distal pulses.   Pulmonary/Chest: Effort normal and breath sounds normal. No respiratory distress. She exhibits no tenderness.  Neurological: She is alert and oriented to person, place, and time.  Skin: She is not diaphoretic.  Psychiatric: She has a normal mood and affect. Her behavior is normal. Judgment and thought content normal.  Very tearful, especially when speaking directly about her father.    Lab Results  Component Value Date   WBC 6.8 10/25/2012   HGB 15.4* 04/10/2013   HCT 47.1* 04/10/2013   PLT 209 10/25/2012   GLUCOSE 97 04/10/2013   CHOL 116 03/03/2012   TRIG 83.0 03/03/2012   HDL 66.60 03/03/2012   LDLCALC 33 03/03/2012   ALT 22 03/31/2012   AST 22 03/31/2012   NA 141 04/10/2013   K 3.7 04/10/2013   CL 104 04/10/2013   CREATININE 0.9 04/10/2013   BUN 10 04/10/2013   CO2 29 04/10/2013   TSH 0.45 04/10/2013         Assessment & Plan:  Denisha was seen today for anxiety.  Diagnoses and associated orders for this visit:  Grief reaction - escitalopram (LEXAPRO) 10 MG tablet; Take 1 tablet (10 mg total) by mouth daily.    Also provided pt with behavioral health information and encouraged her to seek out a grief counselor or grief group while she is out of work to help her.  Return precautions provided, and patient handout on grief reaction and  depression.  Plan to follow up in 1 month to reassess, or for worsening or persistent symptoms despite treatment.  Patient Instructions  Lexapro, 1 pill daily.   Seek out a bereavement group while you are out of work.  If emergency symptoms discussed during visit developed, seek medical attention immediately.  Followup in about 1 month to reassess, or for  worsening or persistent symptoms despite treatment.

## 2013-09-28 NOTE — Patient Instructions (Addendum)
Lexapro, 1 pill daily.   Seek out a bereavement group while you are out of work.  If emergency symptoms discussed during visit developed, seek medical attention immediately.  Followup in about 1 month to reassess, or for worsening or persistent symptoms despite treatment.    Depression Depression is feeling sad, low, down in the dumps, blue, gloomy, or empty. In general, there are two kinds of depression:  Normal sadness or grief. This can happen after something upsetting. It often goes away on its own within 2 weeks. After losing a loved one (bereavement), normal sadness and grief may last longer than two weeks. It usually gets better with time.  Clinical depression. This kind lasts longer than normal sadness or grief. It keeps you from doing the things you normally do in life. It is often hard to function at home, work, or at school. It may affect your relationships with others. Treatment is often needed. GET HELP RIGHT AWAY IF:  You have thoughts about hurting yourself or others.  You lose touch with reality (psychotic symptoms). You may:  See or hear things that are not real.  Have untrue beliefs about your life or people around you.  Your medicine is giving you problems. MAKE SURE YOU:  Understand these instructions.  Will watch your condition.  Will get help right away if you are not doing well or get worse. Document Released: 03/14/2010 Document Revised: 06/26/2013 Document Reviewed: 06/11/2011 [facility] Patient Information 2015 [place], [place]. This information is not intended to replace advice given to you by your health care provider. Make sure you discuss any questions you have with your health care provider. Grief Reaction Grief is a normal response to the death of someone close to you. Feelings of fear, anger, and guilt can affect almost everyone who loses someone they love. Symptoms of depression are also common. These include problems with sleep, loss of appetite,  and lack of energy. These grief reaction symptoms often last for weeks to months after a loss. They may also return during special times that remind you of the person you lost, such as an anniversary or birthday. Anxiety, insomnia, irritability, and deep depression may last beyond the period of normal grief. If you experience these feelings for 6 months or longer, you may have clinical depression. Clinical depression requires further medical attention. If you think that you have clinical depression, you should contact your caregiver. If you have a history of depression or a family history of depression, you are at greater risk of clinical depression. You are also at greater risk of developing clinical depression if the loss was traumatic or the loss was of someone with whom you had unresolved issues.  A grief reaction can become complicated by being blocked. This means being unable to cry or express extreme emotions. This may prolong the grieving period and worsen the emotional effects of the loss. Mourning is a natural event in human life. A healthy grief reaction is one that is not blocked. It requires a time of sadness and readjustment. It is very important to share your sorrow and fear with others, especially close friends and family. Professional counselors and clergy can also help you process your grief. Document Released: 02/09/2005 Document Revised: 06/26/2013 Document Reviewed: 10/20/2005 [facility] Patient Information 2015 [place], [place]. This information is not intended to replace advice given to you by your health care provider. Make sure you discuss any questions you have with your health care provider.

## 2013-09-28 NOTE — Progress Notes (Signed)
Pre visit review using our clinic review tool, if applicable. No additional management support is needed unless otherwise documented below in the visit note. 

## 2013-10-04 ENCOUNTER — Telehealth: Payer: Self-pay

## 2013-10-04 NOTE — Telephone Encounter (Signed)
Called and spoke with pt and pt is aware of appt on 10/05/13 at 11:15

## 2013-10-04 NOTE — Telephone Encounter (Signed)
Received some detailed forms from pt's emplyer and pt needs a 30 mintue visit to fill out paperwork.  Left a message for pt to call office to set up an appointment.

## 2013-10-05 ENCOUNTER — Ambulatory Visit (INDEPENDENT_AMBULATORY_CARE_PROVIDER_SITE_OTHER): Payer: Managed Care, Other (non HMO) | Admitting: Physician Assistant

## 2013-10-05 ENCOUNTER — Encounter: Payer: Self-pay | Admitting: Physician Assistant

## 2013-10-05 VITALS — BP 102/80 | Temp 98.9°F | Wt 148.0 lb

## 2013-10-05 DIAGNOSIS — F4321 Adjustment disorder with depressed mood: Secondary | ICD-10-CM

## 2013-10-05 NOTE — Progress Notes (Signed)
Subjective:    Patient ID: [name], female    DOB: 02/24/1960, 53 y.o.   MRN: [id number]  HPI Patient is a 53 y.o. female presenting for FMLA/disability forms to be filled out. He work requires that she have cognitive and behavioral exams documented. They also require her to be seen by psychiatry within the first 30 days of being out for behavioral issues if she is going to be out of work for more than 30 days.    Review of Systems  Psychiatric/Behavioral: Positive for sleep disturbance (wakes several times per night.) and decreased concentration. Negative for suicidal ideas, hallucinations, behavioral problems, confusion, self-injury, dysphoric mood and agitation. The patient is nervous/anxious. The patient is not hyperactive.   All other systems reviewed and are negative.  Past Medical History  Diagnosis Date  . Ganglion cyst   . Family hx of colon cancer   . Epigastric abdominal pain   . Anxiety disorder   . Esophageal stricture   . GERD (gastroesophageal reflux disease)   . DJD (degenerative joint disease)   . Hemorrhoid   . Cholelithiasis   . Hypothyroidism   . Anxiety   . Asthma     h/o asthma as a child  . PONV (postoperative nausea and vomiting)   . Female pelvic peritoneal adhesions 10/26/2012    History   Social History  . Marital Status: Married    Spouse Name: N/A    Number of Children: 2  . Years of Education: 14   Occupational History  . auditor, Passenger transport manager [place] History Main Topics  . Smoking status: Current Every Day Smoker -- 0.50 packs/day for 10 years    Types: Cigarettes  . Smokeless tobacco: Never Used  . Alcohol Use: 1.0 oz/week    2 drink(s) per week     Comment: occasionally  . Drug Use: No  . Sexual Activity: Yes    Partners: Male   Other Topics Concern  . Not on file   Social History Narrative   HSG, some community college. .Married -'81.   2 daughters  '83, '88 daughter  with bipolar dz. Has had  behavior issues. 1 granddaughter '05 living with her. Occupation:Bank worker. Marriage in good. No history of abuse.                   Past Surgical History  Procedure Laterality Date  . Cholecystectomy    . Hemorrhoid surgery    . Tubal ligation    . Cesarean section    . Urologic surgery for ureteropelvic junction obstruction    . Abdominal hysterectomy    . Laparoscopic bilateral salpingectomy N/A 10/26/2012    Procedure: operative laparoscopy with lysis of adhesions;  Surgeon: Thornell Sartorius, MD;  Location: [facility];  Service: Gynecology;  Laterality: N/A;    Family History  Problem Relation Age of Onset  . COPD Mother   . Diabetes Father   . Heart disease Father   . Hypertension Father   . Hypertension Sister   . Depression Sister   . Arthritis Sister   . Cancer Neg Hx     Allergies  Allergen Reactions  . Adhesive [Tape] Dermatitis    Pt prefers paper tape  . Promethazine Hcl Itching and Nausea And Vomiting    Current Outpatient Prescriptions on File Prior to Visit  Medication Sig Dispense Refill  . ALPRAZolam (XANAX) 1 MG tablet Take 1 tablet (1 mg total) by mouth 3 (  three) times daily.  270 tablet  1  . Cholecalciferol (GNP VITAMIN D) 1000 UNITS tablet Take 1,000 Units by mouth daily.        Marland Kitchen escitalopram (LEXAPRO) 10 MG tablet Take 1 tablet (10 mg total) by mouth daily.  45 tablet  1  . HYDROcodone-acetaminophen (NORCO/VICODIN) 5-325 MG per tablet TAKE 1 TABLET BY MOUTH EVERY 6 HOURS AS NEEDED FOR PAIN  120 tablet  3  . levothyroxine (SYNTHROID, LEVOTHROID) 50 MCG tablet TAKE 1 TABLET BY MOUTH DAILY  90 tablet  3  . meloxicam (MOBIC) 15 MG tablet TAKE 1 TABLET EVERY DAY  90 tablet  3  . ranitidine (ZANTAC) 150 MG tablet Take 150 mg by mouth 2 (two) times daily.       No current facility-administered medications on file prior to visit.    EXAM: BP 102/80  Temp(Src) 98.9 F (37.2 C) (Oral)  Wt 148 lb (67.132 kg)     Objective:   Physical Exam    Constitutional: She appears well-developed and well-nourished. No distress.  Skin: She is not diaphoretic.  Psychiatric: She has a normal mood and affect. Her behavior is normal. Judgment and thought content normal.  Risk to self/others: The patient currently has no suicidal ideation, homicidal ideation. Patient states that if she feels as though she is at risk for hurting herself or others, she will seek emergency medical treatment at an emergency department. Also the patient states that she cannot harm herself or others as she is a caretaker for her granddaughter. Emotional functioning: Patient is appropriately tearful and anxious during the exam given recent history of her father's death. She does not require assistance to compose herself, she states that she has a history of panic attacks, however denies them currently. Cognitive functioning: The patient is able follow a three-step command (pretend to pour coffee into a cup, add sugar to the cup, and stir in the sugar). The patient is able to add and subtract serial threes. The patient is able to relate 4 unrelated words after 5 minutes (car, house, ball, stairs). The patient is able to apply focus and concentration during the session for greater than 30 minutes. The patient given her current circumstances is able to respond to direct questions appropriately. The patients reasoning and judgment are within normal limits. The patient does not exhibit psychotic symptoms. A Mini-Mental status exam was not completed. Behavioral observations: No abnormal behavior, some crying when speaking about her family, appropriate. Patient's psychomotor activity is unremarkable. The patient presented with appropriate dress and hygiene. Patient does not have difficulty with impulse control. The patient's speech is normal. Activities of daily living: The patient is currently not working in any capacity. She denies any significant weight or appetite changes. She does admit  to sleep disturbances, waking several times per night due to stress of her father's recent death. She denies socialization problems. She is able to clean and maintain her residence. She is able to perform routine shopping. She is able to pay her bills. She is able to safely operate an automobile. Her current daily activities include managing her recently deceased father's estate, and functioning as caregiver for her granddaughter.     Lab Results  Component Value Date   WBC 6.8 10/25/2012   HGB 15.4* 04/10/2013   HCT 47.1* 04/10/2013   PLT 209 10/25/2012   GLUCOSE 97 04/10/2013   CHOL 116 03/03/2012   TRIG 83.0 03/03/2012   HDL 66.60 03/03/2012   LDLCALC 33 03/03/2012  ALT 22 03/31/2012   AST 22 03/31/2012   NA 141 04/10/2013   K 3.7 04/10/2013   CL 104 04/10/2013   CREATININE 0.9 04/10/2013   BUN 10 04/10/2013   CO2 29 04/10/2013   TSH 0.45 04/10/2013         Assessment & Plan:  Bernese was seen today for forms.  Diagnoses and associated orders for this visit:  Grief reaction Comments: Encounter to fill out forms for FMLA/Disability.    Advised patient that her her short-term behavioral disability forms, she is required to be in treatment with a psychiatrist by the 30th day of her absence from work in order to maintain her disability status. She is amenable to this and will contact the behavioral health center.  Current plans for patient to be covered for disability from the date of her father's death Oct 11, 2013, through 12/04/2013. Any additional time off is not permitted or obviously required at this time.  Return precautions provided.  Plan to follow up as needed, or for worsening or persistent symptoms despite treatment.

## 2013-10-05 NOTE — Progress Notes (Signed)
Pre visit review using our clinic review tool, if applicable. No additional management support is needed unless otherwise documented below in the visit note. 

## 2013-10-06 ENCOUNTER — Telehealth: Payer: Self-pay

## 2013-10-06 ENCOUNTER — Telehealth: Payer: Self-pay | Admitting: Internal Medicine

## 2013-10-06 ENCOUNTER — Encounter: Payer: Self-pay | Admitting: Physician Assistant

## 2013-10-06 NOTE — Telephone Encounter (Signed)
Called and spoke with pt and pt is aware of Matthew's recommendations. Pt states she will be out of town all next week and then she was going to schedule an appt with a psychiatrist.  Advised pt that she should schedule an appt ASAP due to psychiatrist scheduling out a few weeks.  Pt verbalized understanding that she needs to call psychiatrist ASAP and it needs to be done before 10/21/13 or pt will have to return to work.

## 2013-10-06 NOTE — Telephone Encounter (Signed)
Per Matthew's request:  Per the paperwork pt needs to see a psychiatrist not just a therpaist or psychologist.  Pt needs to make a psych appt by 8.29.2015 or she will have to return to work.    Left a message for return call.

## 2013-10-06 NOTE — Telephone Encounter (Signed)
Relevant patient education mailed to patient.  

## 2013-10-11 ENCOUNTER — Ambulatory Visit (INDEPENDENT_AMBULATORY_CARE_PROVIDER_SITE_OTHER): Payer: Managed Care, Other (non HMO) | Admitting: Licensed Clinical Social Worker

## 2013-10-11 DIAGNOSIS — F4322 Adjustment disorder with anxiety: Secondary | ICD-10-CM

## 2013-10-19 ENCOUNTER — Other Ambulatory Visit: Payer: Self-pay

## 2013-10-19 NOTE — Telephone Encounter (Signed)
Rx request for refill of Meloxicam 15 mg tablet- take 1 tablet by mouth every day.  #90 with 3 refills.  Per Mlatthew he has never seen patient for this and advises that pt get rx from her orthopedic specialist.  Per Rodman Key pt needs to establish with him and then it can be filled. Pt is aware and will come to est care on 10/31/13.

## 2013-10-31 ENCOUNTER — Ambulatory Visit: Payer: Managed Care, Other (non HMO) | Admitting: Physician Assistant

## 2013-10-31 ENCOUNTER — Encounter: Payer: Self-pay | Admitting: Physician Assistant

## 2013-10-31 ENCOUNTER — Ambulatory Visit (INDEPENDENT_AMBULATORY_CARE_PROVIDER_SITE_OTHER): Payer: Managed Care, Other (non HMO) | Admitting: Physician Assistant

## 2013-10-31 VITALS — BP 110/80 | HR 60 | Temp 99.1°F | Resp 18 | Ht 65.0 in | Wt 146.8 lb

## 2013-10-31 DIAGNOSIS — Z23 Encounter for immunization: Secondary | ICD-10-CM

## 2013-10-31 DIAGNOSIS — K219 Gastro-esophageal reflux disease without esophagitis: Secondary | ICD-10-CM

## 2013-10-31 DIAGNOSIS — M129 Arthropathy, unspecified: Secondary | ICD-10-CM

## 2013-10-31 DIAGNOSIS — Z7689 Persons encountering health services in other specified circumstances: Secondary | ICD-10-CM

## 2013-10-31 DIAGNOSIS — F411 Generalized anxiety disorder: Secondary | ICD-10-CM

## 2013-10-31 DIAGNOSIS — Z7189 Other specified counseling: Secondary | ICD-10-CM

## 2013-10-31 MED ORDER — ALPRAZOLAM 1 MG PO TABS: 1.0000 mg | ORAL_TABLET | Freq: Three times a day (TID) | ORAL | Status: DC

## 2013-10-31 MED ORDER — CELECOXIB 200 MG PO CAPS: 200.0000 mg | ORAL_CAPSULE | Freq: Every day | ORAL | Status: AC

## 2013-10-31 NOTE — Progress Notes (Signed)
Subjective:    Patient ID: [name], female    DOB: 06-Jul-1960, 53 y.o.   MRN: [id number]  HPI Patient presents to clinic today to establish care. Previous Dr. [name] Hedges pt.   Acute Concerns: Anxiety- Was told that she needs to see Psychiatry, however has not done so. She has been on a trial of Lexapro daily, however this has been upsetting her stomach, and giving her diarrhea, so she has been unable to take it. She has been taking Xanax 1 mg, TID to control her anxiety, which has been worsened recently by the death of her father. She is tolerating this well and denies adverse effects to treatment.   Chronic Issues: Multiple joint arthritis- She states that she has arthritis in her back and her hands. She used to take Celebrex for this, and states she tolerated this well, however more recently she has been prescribed Mobic and Norco. She states that she has tolerated both of these medications well and denies adverse effects to treatment. She is currently out of mobic, and is requesting a refill, unless she can get Celebrex cheaper than previously because this seemed to work better. She still has Norco, she is taking it as needed, states she hasn't had one in the last month, and she reserves it for extreme pain that isn't benefited by Mobic. She also has seen [facility] in the past, the most recent visit was August, and she has a follow up in late October.   Acid relfux- has seen GI for this. Managed well with Zantac. Has had complication of an esophageal stricture causing dysphagia. This has been treated by GI with dilation in the past. No symptoms currently. States she is due for follow up with GI.  Health Maintenance: Dental -- Sees Dr. Randol Kern every 6 months. Vision -- Prescription glasses, hasn't had exam in over a year. Immunizations -- UTD except for Flu shot. Colonoscopy -- Needs colonoscopy, had one in 2009, told to return every 5 years. Mammogram -- Every year, last  was this year she thinks, schedules on her own. PAP --  S/p hysterectomy, hx of fibroids, managed by OB/GYN.   Review of Systems Patient denies fevers, chills, nausea, vomiting, diarrhea, chest pain, shortness of breath, orthopnea, headache, syncope. Denies lower extremity edema, abdominal pain, change in appetite, change in bowel movements. Patient denies rashes. No other specific complaints in a complete review of systems.   Past Medical History  Diagnosis Date  . Ganglion cyst   . Family hx of colon cancer   . Epigastric abdominal pain   . Anxiety disorder   . Esophageal stricture   . GERD (gastroesophageal reflux disease)   . DJD (degenerative joint disease)   . Hemorrhoid   . Cholelithiasis   . Hypothyroidism   . Anxiety   . Asthma     h/o asthma as a child  . PONV (postoperative nausea and vomiting)   . Female pelvic peritoneal adhesions 10/26/2012    History   Social History  . Marital Status: Married    Spouse Name: N/A    Number of Children: 2  . Years of Education: 14   Occupational History  . 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 Smoking status: Current Every Day Smoker -- 0.50 packs/day for 10 years    Types: Cigarettes  . Smokeless tobacco: Never Used  . Alcohol Use: 1.0 oz/week    2 drink(s) per week  Comment: occasionally  . Drug Use: No  . Sexual Activity: Yes    Partners: Male   Other Topics Concern  . Not on file   Social History Narrative   HSG, some community college. .Married -'81.   2 daughters  '83, '88 daughter  with bipolar dz. Has had behavior issues. 1 granddaughter '05 living with her. Occupation:Bank worker. Marriage in good. No history of abuse.                   Past Surgical History  Procedure Laterality Date  . Cholecystectomy    . Hemorrhoid surgery    . Tubal ligation    . Cesarean section    . Urologic surgery for ureteropelvic junction obstruction    . Abdominal hysterectomy      . Laparoscopic bilateral salpingectomy N/A 10/26/2012    Procedure: operative laparoscopy with lysis of adhesions;  Surgeon: Thornell Sartorius, MD;  Location: [facility];  Service: Gynecology;  Laterality: N/A;    Family History  Problem Relation Age of Onset  . COPD Mother   . Diabetes Father   . Heart disease Father   . Hypertension Father   . Cancer Father     amyloidosis   . Hypertension Sister   . Depression Sister   . Arthritis Sister     Allergies  Allergen Reactions  . Adhesive [Tape] Dermatitis    Pt prefers paper tape  . Promethazine Hcl Itching and Nausea And Vomiting    Current Outpatient Prescriptions on File Prior to Visit  Medication Sig Dispense Refill  . Cholecalciferol (GNP VITAMIN D) 1000 UNITS tablet Take 1,000 Units by mouth daily.        Marland Kitchen escitalopram (LEXAPRO) 10 MG tablet Take 1 tablet (10 mg total) by mouth daily.  45 tablet  1  . HYDROcodone-acetaminophen (NORCO/VICODIN) 5-325 MG per tablet TAKE 1 TABLET BY MOUTH EVERY 6 HOURS AS NEEDED FOR PAIN  120 tablet  3  . levothyroxine (SYNTHROID, LEVOTHROID) 50 MCG tablet TAKE 1 TABLET BY MOUTH DAILY  90 tablet  3  . metaxalone (SKELAXIN) 800 MG tablet       . ranitidine (ZANTAC) 150 MG tablet Take 150 mg by mouth 2 (two) times daily.       No current facility-administered medications on file prior to visit.   The PFS history was reviewed with the pt at time of visit.  EXAM: BP 110/80  Pulse 60  Temp(Src) 99.1 F (37.3 C) (Oral)  Resp 18  Ht 5\' 5"  (1.651 m)  Wt 146 lb 12.8 oz (66.588 kg)  BMI 24.43 kg/m2      Objective:   Physical Exam  Nursing note and vitals reviewed. Constitutional: She is oriented to person, place, and time. She appears well-developed and well-nourished. No distress.  HENT:  Head: Normocephalic and atraumatic.  Eyes: Conjunctivae and EOM are normal. Pupils are equal, round, and reactive to light.  Neck: Normal range of motion. Neck supple.  Cardiovascular: Normal rate, regular  rhythm and intact distal pulses.   Pulmonary/Chest: Effort normal and breath sounds normal. No stridor. No respiratory distress. She has no wheezes. She has no rales. She exhibits no tenderness.  Musculoskeletal: Normal range of motion. She exhibits no edema.  Gait Normal.  Lymphadenopathy:    She has no cervical adenopathy.  Neurological: She is alert and oriented to person, place, and time. She exhibits normal muscle tone. Coordination normal.  Skin: Skin is warm and dry. No rash noted. She  is not diaphoretic. No erythema. No pallor.  Psychiatric: She has a normal mood and affect. Her behavior is normal. Judgment and thought content normal.     Lab Results  Component Value Date   WBC 6.8 10/25/2012   HGB 15.4* 04/10/2013   HCT 47.1* 04/10/2013   PLT 209 10/25/2012   GLUCOSE 97 04/10/2013   CHOL 116 03/03/2012   TRIG 83.0 03/03/2012   HDL 66.60 03/03/2012   LDLCALC 33 03/03/2012   ALT 22 03/31/2012   AST 22 03/31/2012   NA 141 04/10/2013   K 3.7 04/10/2013   CL 104 04/10/2013   CREATININE 0.9 04/10/2013   BUN 10 04/10/2013   CO2 29 04/10/2013   TSH 0.45 04/10/2013        Assessment & Plan:  Arelia was seen today for establish care.  Diagnoses and associated orders for this visit:  ANXIETY DISORDER Comments: Has failed Sertraline and lexapro, only Xanax is effective. Refer to Psychiatry for management of chronic benzo. Refill Xanax to bridge. - ALPRAZolam (XANAX) 1 MG tablet; Take 1 tablet (1 mg total) by mouth 3 (three) times daily.  ARTHRITIS Comments: Retrial of celebrex if affordable. Continue follow up with Orthopedics. - celecoxib (CELEBREX) 200 MG capsule; Take 1 capsule (200 mg total) by mouth daily.  Gastroesophageal reflux disease, esophagitis presence not specified Comments: Continue Zantac and maintain follow up with GI for hx of stricture.  Encounter to establish care Comments: Pt still needs to schedule Colonoscopy with GI. Will have pt call.  Need for prophylactic  vaccination and inoculation against influenza - Flu Vaccine QUAD 36+ mos PF IM (Fluarix Quad PF)    Discussed with patient that, due to her inability to take SSRIs, she needs to be seen by a psychiatrist in order to manage her anxiety with benzodiazepines, or other treatments that they feel are necessary. I have provided her a one-month refill of her Xanax to bridge her to an appointment with a psychiatrist. Patient is amenable to this plan and will call to schedule an appointment.  Return precautions provided, and patient handout on anxiety, GERD, and osteoarthritis.  Plan to follow up in 6 months for annual exam, otherwise as needed, or for worsening or persistent symptoms despite treatment.  Patient Instructions  Call and schedule an appointment with a Psychiatrist to help better manage your anxiety.  Celebrex has been sent to your pharmacy to try again. If this is still too expensive, we can instead send meloxicam again.  You also should Call your GI doctor to find out when you need to have your colonoscopy.  Keep your follow ups with OB/GYN and Orthopedics.  If emergency symptoms discussed during visit developed, seek medical attention immediately.  Followup in about 6 months for annual exam, otherwise as needed, or for worsening or persistent symptoms despite treatment.

## 2013-10-31 NOTE — Patient Instructions (Addendum)
Call and schedule an appointment with a Psychiatrist to help better manage your anxiety.  Celebrex has been sent to your pharmacy to try again. If this is still too expensive, we can instead send meloxicam again.  You also should Call your GI doctor to find out when you need to have your colonoscopy.  Keep your follow ups with OB/GYN and Orthopedics.  If emergency symptoms discussed during visit developed, seek medical attention immediately.  Followup in about 6 months for annual exam, otherwise as needed, or for worsening or persistent symptoms despite treatment.     Generalized Anxiety Disorder Generalized anxiety disorder (GAD) is a mental disorder. It interferes with life functions, including relationships, work, and school. GAD is different from normal anxiety, which everyone experiences at some point in their lives in response to specific life events and activities. Normal anxiety actually helps [place] prepare for and get through these life events and activities. Normal anxiety goes away after the event or activity is over.  GAD causes anxiety that is not necessarily related to specific events or activities. It also causes excess anxiety in proportion to specific events or activities. The anxiety associated with GAD is also difficult to control. GAD can vary from mild to severe. People with severe GAD can have intense waves of anxiety with physical symptoms (panic attacks).  SYMPTOMS The anxiety and worry associated with GAD are difficult to control. This anxiety and worry are related to many life events and activities and also occur more days than not for 6 months or longer. People with GAD also have three or more of the following symptoms (one or more in children):  Restlessness.   Fatigue.  Difficulty concentrating.   Irritability.  Muscle tension.  Difficulty sleeping or unsatisfying sleep. DIAGNOSIS GAD is diagnosed through an assessment by your health care provider. Your  health care provider will ask you questions aboutyour mood,physical symptoms, and events in your life. Your health care provider may ask you about your medical history and use of alcohol or drugs, including prescription medicines. Your health care provider may also do a physical exam and blood tests. Certain medical conditions and the use of certain substances can cause symptoms similar to those associated with GAD. Your health care provider may refer you to a mental health specialist for further evaluation. TREATMENT The following therapies are usually used to treat GAD:   Medication. Antidepressant medication usually is prescribed for long-term daily control. Antianxiety medicines may be added in severe cases, especially when panic attacks occur.   Talk therapy (psychotherapy). Certain types of talk therapy can be helpful in treating GAD by providing support, education, and guidance. A form of talk therapy called cognitive behavioral therapy can teach you healthy ways to think about and react to daily life events and activities.  Stress managementtechniques. These include yoga, meditation, and exercise and can be very helpful when they are practiced regularly. A mental health specialist can help determine which treatment is best for you. Some people see improvement with one therapy. However, other people require a combination of therapies. Document Released: 06/06/2012 Document Revised: 06/26/2013 Document Reviewed: 06/06/2012 [facility] Patient Information 2015 [place], [place]. This information is not intended to replace advice given to you by your health care provider. Make sure you discuss any questions you have with your health care provider. Osteoarthritis Osteoarthritis is a disease that causes soreness and inflammation of a joint. It occurs when the cartilage at the affected joint wears down. Cartilage acts as a cushion, covering  the ends of bones where they meet to form a joint.  Osteoarthritis is the most common form of arthritis. It often occurs in older people. The joints affected most often by this condition include those in the:  Ends of the fingers.  Thumbs.  Neck.  Lower back.  Knees.  Hips. CAUSES  Over time, the cartilage that covers the ends of bones begins to wear away. This causes bone to rub on bone, producing pain and stiffness in the affected joints.  RISK FACTORS Certain factors can increase your chances of having osteoarthritis, including:  Older age.  Excessive body weight.  Overuse of joints.  Previous joint injury. SIGNS AND SYMPTOMS   Pain, swelling, and stiffness in the joint.  Over time, the joint may lose its normal shape.  Small deposits of bone (osteophytes) may grow on the edges of the joint.  Bits of bone or cartilage can break off and float inside the joint space. This may cause more pain and damage. DIAGNOSIS  Your health care provider will do a physical exam and ask about your symptoms. Various tests may be ordered, such as:  X-rays of the affected joint.  An MRI scan.  Blood tests to rule out other types of arthritis.  Joint fluid tests. This involves using a needle to draw fluid from the joint and examining the fluid under a microscope. TREATMENT  Goals of treatment are to control pain and improve joint function. Treatment plans may include:  A prescribed exercise program that allows for rest and joint relief.  A weight control plan.  Pain relief techniques, such as:  Properly applied heat and cold.  Electric pulses delivered to nerve endings under the skin (transcutaneous electrical nerve stimulation [TENS]).  Massage.  Certain nutritional supplements.  Medicines to control pain, such as:  Acetaminophen.  Nonsteroidal anti-inflammatory drugs (NSAIDs), such as naproxen.  Narcotic or central-acting agents, such as tramadol.  Corticosteroids. These can be given orally or as an  injection.  Surgery to reposition the bones and relieve pain (osteotomy) or to remove loose pieces of bone and cartilage. Joint replacement may be needed in advanced states of osteoarthritis. HOME CARE INSTRUCTIONS   Take medicines only as directed by your health care provider.  Maintain a healthy weight. Follow your health care provider's instructions for weight control. This may include dietary instructions.  Exercise as directed. Your health care provider can recommend specific types of exercise. These may include:  Strengthening exercises. These are done to strengthen the muscles that support joints affected by arthritis. They can be performed with weights or with exercise bands to add resistance.  Aerobic activities. These are exercises, such as brisk walking or low-impact aerobics, that get your heart pumping.  Range-of-motion activities. These keep your joints limber.  Balance and agility exercises. These help you maintain daily living skills.  Rest your affected joints as directed by your health care provider.  Keep all follow-up visits as directed by your health care provider. SEEK MEDICAL CARE IF:   Your skin turns red.  You develop a rash in addition to your joint pain.  You have worsening joint pain.  You have a fever along with joint or muscle aches. SEEK IMMEDIATE MEDICAL CARE IF:  You have a significant loss of weight or appetite.  You have night sweats. [place] of Arthritis and Musculoskeletal and Skin Diseases: www.niams.SouthExposed.es  [employer] on Aging: http://kim-miller.com/  American College of Rheumatology: www.rheumatology.org Document Released: 02/09/2005 Document  Revised: 06/26/2013 Document Reviewed: 10/17/2012 [facility] Patient Information 2015 [place], [place]. This information is not intended to replace advice given to you by your health care provider. Make sure you discuss any questions you have with your health  care provider. Gastroesophageal Reflux Disease, Adult Gastroesophageal reflux disease (GERD) happens when acid from your stomach goes into your food pipe (esophagus). The acid can cause a burning feeling in your chest. Over time, the acid can make small holes (ulcers) in your food pipe.  HOME CARE  Ask your doctor for advice about:  Losing weight.  Quitting smoking.  Alcohol use.  Avoid foods and drinks that make your problems worse. You may want to avoid:  Caffeine and alcohol.  Chocolate.  Mints.  Garlic and onions.  Spicy foods.  Citrus fruits, such as oranges, lemons, or limes.  Foods that contain tomato, such as sauce, chili, salsa, and pizza.  Fried and fatty foods.  Avoid lying down for 3 hours before you go to bed or before you take a nap.  Eat small meals often, instead of large meals.  Wear loose-fitting clothing. Do not wear anything tight around your waist.  Raise (elevate) the head of your bed 6 to 8 inches with wood blocks. Using extra pillows does not help.  Only take medicines as told by your doctor.  Do not take aspirin or ibuprofen. GET HELP RIGHT AWAY IF:   You have pain in your arms, neck, jaw, teeth, or back.  Your pain gets worse or changes.  You feel sick to your stomach (nauseous), throw up (vomit), or sweat (diaphoresis).  You feel short of breath, or you pass out (faint).  Your throw up is green, yellow, black, or looks like coffee grounds or blood.  Your poop (stool) is red, bloody, or black. MAKE SURE YOU:   Understand these instructions.  Will watch your condition.  Will get help right away if you are not doing well or get worse. Document Released: 07/29/2007 Document Revised: 05/04/2011 Document Reviewed: 08/29/2010 [facility] Patient Information 2015 [place], [place]. This information is not intended to replace advice given to you by your health care provider. Make sure you discuss any questions you have with your health care  provider.

## 2013-10-31 NOTE — Progress Notes (Signed)
Pre visit review using our clinic review tool, if applicable. No additional management support is needed unless otherwise documented below in the visit note. 

## 2013-11-10 ENCOUNTER — Telehealth: Payer: Self-pay | Admitting: Physician Assistant

## 2013-11-10 NOTE — Telephone Encounter (Signed)
Monique needs to confirm pt's return to work date.  She states is till 11/13/13 and pt can go to work on 11/14/13.  Beckie Busing also states the pt told her she was returning to work regardless on 11/14/13. Beckie Busing states you can leave a message on her voicemail since it is confidential.

## 2013-11-10 NOTE — Telephone Encounter (Signed)
According to our notes, that is correct.

## 2013-11-14 NOTE — Telephone Encounter (Signed)
Left a detailed message for [name] on confidential voicemail that return to work date is correct.

## 2014-02-08 ENCOUNTER — Other Ambulatory Visit: Payer: Self-pay

## 2014-02-08 MED ORDER — LEVOTHYROXINE SODIUM 50 MCG PO TABS: 50.0000 ug | ORAL_TABLET | Freq: Every day | ORAL | Status: DC

## 2014-02-08 NOTE — Telephone Encounter (Signed)
Rx request for levothyroxine 50 mcg tablet- Take 1 tablet by mouth daily #90  Pharm:  [facility]  Rx sent to pharmacy; noted pt needs to establish with new PCP for future refills.

## 2014-02-28 ENCOUNTER — Telehealth: Payer: Self-pay

## 2014-02-28 NOTE — Telephone Encounter (Signed)
Received a medication [facility]ill [facility] from [facility] for celebrex 200 mg capsule.  Called and asked pt if she was going to establish care with new PCP here or elsewhere. Pt states she has not made up her mind and she would contact our office if she decided to stay.  Advised pt that in order to have mediation refilled he needed to know. Pt verbalized understand. Rx denied and faxed back to pharmacy.

## 2014-03-13 ENCOUNTER — Other Ambulatory Visit: Payer: Self-pay | Admitting: Nurse Practitioner

## 2014-03-14 ENCOUNTER — Other Ambulatory Visit: Payer: Self-pay | Admitting: Nurse Practitioner

## 2014-05-10 ENCOUNTER — Other Ambulatory Visit: Payer: Self-pay | Admitting: Radiology

## 2014-12-03 ENCOUNTER — Other Ambulatory Visit: Payer: Self-pay | Admitting: Urology

## 2014-12-03 DIAGNOSIS — Q6211 Congenital occlusion of ureteropelvic junction: Principal | ICD-10-CM

## 2014-12-03 DIAGNOSIS — Q6239 Other obstructive defects of renal pelvis and ureter: Secondary | ICD-10-CM

## 2014-12-14 ENCOUNTER — Ambulatory Visit (HOSPITAL_COMMUNITY)
Admission: RE | Admit: 2014-12-14 | Discharge: 2014-12-14 | Disposition: A | Discharge status: Home / Self Care | Payer: BLUE CROSS/BLUE SHIELD | Source: Ambulatory Visit | Attending: Urology | Admitting: Urology

## 2014-12-14 DIAGNOSIS — Q6211 Congenital occlusion of ureteropelvic junction: Secondary | ICD-10-CM

## 2014-12-14 DIAGNOSIS — R109 Unspecified abdominal pain: Secondary | ICD-10-CM | POA: Insufficient documentation

## 2014-12-14 DIAGNOSIS — Q6239 Other obstructive defects of renal pelvis and ureter: Secondary | ICD-10-CM | POA: Insufficient documentation

## 2014-12-14 DIAGNOSIS — N2889 Other specified disorders of kidney and ureter: Secondary | ICD-10-CM | POA: Insufficient documentation

## 2014-12-14 MED ORDER — FUROSEMIDE 10 MG/ML IJ SOLN
33.0000 mg | Freq: Once | INTRAMUSCULAR | Status: AC
  Administered 2014-12-14: 33 mg via INTRAVENOUS

## 2014-12-14 MED ORDER — TECHNETIUM TC 99M MERTIATIDE: 15.2000 | Freq: Once | INTRAVENOUS | Status: DC | PRN

## 2014-12-14 MED ORDER — FUROSEMIDE 10 MG/ML IJ SOLN
INTRAMUSCULAR | Status: AC
  Filled 2014-12-14: qty 4

## 2015-01-10 ENCOUNTER — Other Ambulatory Visit: Payer: Self-pay | Admitting: Nurse Practitioner

## 2015-01-10 DIAGNOSIS — E049 Nontoxic goiter, unspecified: Secondary | ICD-10-CM

## 2015-03-11 ENCOUNTER — Ambulatory Visit
Admission: RE | Admit: 2015-03-11 | Discharge: 2015-03-11 | Disposition: A | Discharge status: Home / Self Care | Payer: BLUE CROSS/BLUE SHIELD | Source: Ambulatory Visit | Attending: Nurse Practitioner | Admitting: Nurse Practitioner

## 2015-03-11 DIAGNOSIS — E049 Nontoxic goiter, unspecified: Secondary | ICD-10-CM

## 2015-04-26 ENCOUNTER — Other Ambulatory Visit: Payer: Self-pay | Admitting: Family Medicine

## 2015-06-21 ENCOUNTER — Encounter: Payer: Self-pay | Admitting: Internal Medicine

## 2015-08-23 ENCOUNTER — Encounter: Payer: Self-pay | Admitting: Internal Medicine

## 2015-08-23 ENCOUNTER — Ambulatory Visit (INDEPENDENT_AMBULATORY_CARE_PROVIDER_SITE_OTHER): Payer: BLUE CROSS/BLUE SHIELD | Admitting: Internal Medicine

## 2015-08-23 ENCOUNTER — Telehealth: Payer: Self-pay | Admitting: Internal Medicine

## 2015-08-23 VITALS — BP 110/70 | HR 82 | Ht 65.0 in | Wt 161.2 lb

## 2015-08-23 DIAGNOSIS — K222 Esophageal obstruction: Secondary | ICD-10-CM | POA: Diagnosis not present

## 2015-08-23 DIAGNOSIS — Z800 Family history of malignant neoplasm of digestive organs: Secondary | ICD-10-CM

## 2015-08-23 DIAGNOSIS — K219 Gastro-esophageal reflux disease without esophagitis: Secondary | ICD-10-CM | POA: Diagnosis not present

## 2015-08-23 DIAGNOSIS — R1310 Dysphagia, unspecified: Secondary | ICD-10-CM | POA: Diagnosis not present

## 2015-08-23 MED ORDER — NA SULFATE-K SULFATE-MG SULF 17.5-3.13-1.6 GM/177ML PO SOLN: 1.0000 | Freq: Once | ORAL | Status: DC

## 2015-08-23 MED ORDER — OMEPRAZOLE 40 MG PO CPDR: 40.0000 mg | DELAYED_RELEASE_CAPSULE | Freq: Every day | ORAL | Status: DC

## 2015-08-23 MED ORDER — METOCLOPRAMIDE HCL 10 MG PO TABS: ORAL_TABLET | ORAL | Status: DC

## 2015-08-23 NOTE — Telephone Encounter (Signed)
Sent Prep to pharmacy

## 2015-08-23 NOTE — Patient Instructions (Addendum)
You have been scheduled for an endoscopy. Please follow written instructions given to you at your visit today. If you use inhalers (even only as needed), please bring them with you on the day of your procedure. Your physician has requested that you go to www.startemmi.com and enter the access code given to you at your visit today. This web site gives a general overview about your procedure. However, you should still follow specific instructions given to you by our office regarding your preparation for the procedure.   We have sent the following medications to your pharmacy for you to pick up at your convenience:  Reglan - take one tablet 20 minutes before drinking each part of the prep   We have sent the following medications to your pharmacy for you to pick up at your convenience:  Omeprazole

## 2015-08-23 NOTE — Progress Notes (Signed)
HISTORY OF PRESENT ILLNESS:  [name] is a 55 y.o. female with a history of GERD, complicated by peptic stricture and a family history of colon cancer for which she has undergone prior colonoscopy. She is referred today by her primary care provider Delia Chimes regarding chief complaints of significant GERD despite medical therapy, dysphagia, and the need for colonoscopy. Patient has also undergone ERCP with sphincterotomy remotely for abdominal pain, and elevated liver test post-cholecystectomy. She has not been seen since 2009 when she underwent complete colonoscopy and upper endoscopy. Colonoscopy revealed diverticulosis. Follow-up in 5 years given her family history (parent at age 54) recommended. She is overdue. Her upper endoscopy revealed distal esophageal stricture which was dilated with 16.5 mm balloon. Small hiatal hernia. Otherwise normal exam. Her chief complaint today is that of severe reflux disease. She is taking ranitidine 150 mg twice daily. Many 3 times daily. She also reports some intermittent dysphagia solids as previous. She has had about 7 pound weight gain since her last visit. She smokes. No other complaints.  REVIEW OF SYSTEMS:  All non-GI ROS negative except for headaches, allergies, arthritis, night sweats, sleeping problems   Past Medical History  Diagnosis Date  . Ganglion cyst   . Family hx of colon cancer   . Epigastric abdominal pain   . Anxiety disorder   . Esophageal stricture   . GERD (gastroesophageal reflux disease)   . DJD (degenerative joint disease)   . Hemorrhoid   . Cholelithiasis   . Hypothyroidism   . Anxiety   . Asthma     h/o asthma as a child  . PONV (postoperative nausea and vomiting)   . 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 Diverticulosis     Past Surgical History  Procedure Laterality Date  . Cholecystectomy    . Hemorrhoid surgery    . Tubal ligation    . Cesarean section    . Urologic surgery for ureteropelvic  junction obstruction    . Abdominal hysterectomy    . Laparoscopic bilateral salpingectomy N/A 10/26/2012    Procedure: operative laparoscopy with lysis of adhesions;  Surgeon: Thornell Sartorius, MD;  Location: [facility];  Service: Gynecology;  Laterality: N/A;    Social History [name]  reports that she has been smoking Cigarettes.  She has a 5 pack-year smoking history. She has never used smokeless tobacco. She reports that she drinks about 1.0 oz of alcohol per week. She reports that she does not use illicit drugs.  family history includes Arthritis in her sister; COPD in her mother; Cancer in her father; Depression in her sister; Diabetes in her father; Heart disease in her father; Hypertension in her father and sister.  Allergies  Allergen Reactions  . Adhesive [Tape] Dermatitis    Pt prefers paper tape  . Promethazine Hcl Itching and Nausea And Vomiting       PHYSICAL EXAMINATION: Vital signs: BP 110/70 mmHg  Pulse 82  Ht 5\' 5"  (1.651 m)  Wt 161 lb 4 oz (73.143 kg)  BMI 26.83 kg/m2  Constitutional: generally well-appearing, no acute distress Psychiatric: alert and oriented x3, cooperative Eyes: extraocular movements intact, anicteric, conjunctiva pink Mouth: oral pharynx moist, no lesions Neck: supple no lymphadenopathy Cardiovascular: heart regular rate and rhythm, no murmur Lungs: clear to auscultation bilaterally Abdomen: soft, nontender, nondistended, no obvious ascites, no peritoneal signs, normal bowel sounds, no organomegaly Rectal: Deferred until colonoscopy Extremities: no lower extremity edema bilaterally Skin: Multiple tattoos. no additional lesions on visible  extremities Neuro: No focal deficits. Normal DTRs  ASSESSMENT:  #1. GERD. Deteriorated despite twice a day H2 receptor antagonist therapy #2. Intermittent solid food dysphagia. Likely recurrent peptic stricture #3. Family history of colon cancer in parent at age 3. Index exam 2009 with diverticulosis.  Overdue for follow-up   PLAN:  #1. Reflux precautions with attention to weight loss and discontinuation of smoking #2. Schedule upper endoscopy with esophageal dilation.The nature of the procedure, as well as the risks, benefits, and alternatives were carefully and thoroughly reviewed with the patient. Ample time for discussion and questions allowed. The patient understood, was satisfied, and agreed to proceed. #3. Schedule screening colonoscopy. Patient had concerns over the prep. We'll give Reglan 10 mg before each prep session to help with nausea. Also hard candy.The nature of the procedure, as well as the risks, benefits, and alternatives were carefully and thoroughly reviewed with the patient. Ample time for discussion and questions allowed. The patient understood, was satisfied, and agreed to proceed.  A copy of this consultation note has been sent to Delia Chimes NP

## 2015-09-12 DIAGNOSIS — M479 Spondylosis, unspecified: Secondary | ICD-10-CM | POA: Diagnosis not present

## 2015-09-12 DIAGNOSIS — M5440 Lumbago with sciatica, unspecified side: Secondary | ICD-10-CM | POA: Diagnosis not present

## 2015-09-12 DIAGNOSIS — R609 Edema, unspecified: Secondary | ICD-10-CM | POA: Diagnosis not present

## 2015-09-12 DIAGNOSIS — E039 Hypothyroidism, unspecified: Secondary | ICD-10-CM | POA: Diagnosis not present

## 2015-09-12 DIAGNOSIS — G629 Polyneuropathy, unspecified: Secondary | ICD-10-CM | POA: Diagnosis not present

## 2015-09-27 ENCOUNTER — Encounter: Payer: Self-pay | Admitting: Internal Medicine

## 2015-09-27 ENCOUNTER — Ambulatory Visit (AMBULATORY_SURGERY_CENTER): Payer: BLUE CROSS/BLUE SHIELD | Admitting: Internal Medicine

## 2015-09-27 VITALS — BP 129/87 | HR 68 | Temp 97.8°F | Resp 14

## 2015-09-27 DIAGNOSIS — D122 Benign neoplasm of ascending colon: Secondary | ICD-10-CM

## 2015-09-27 DIAGNOSIS — K219 Gastro-esophageal reflux disease without esophagitis: Secondary | ICD-10-CM

## 2015-09-27 DIAGNOSIS — K635 Polyp of colon: Secondary | ICD-10-CM | POA: Diagnosis not present

## 2015-09-27 DIAGNOSIS — R1310 Dysphagia, unspecified: Secondary | ICD-10-CM

## 2015-09-27 DIAGNOSIS — K222 Esophageal obstruction: Secondary | ICD-10-CM

## 2015-09-27 DIAGNOSIS — Z1211 Encounter for screening for malignant neoplasm of colon: Secondary | ICD-10-CM | POA: Diagnosis not present

## 2015-09-27 DIAGNOSIS — Z800 Family history of malignant neoplasm of digestive organs: Secondary | ICD-10-CM | POA: Diagnosis present

## 2015-09-27 MED ORDER — SODIUM CHLORIDE 0.9 % IV SOLN: 500.0000 mL | INTRAVENOUS | Status: DC

## 2015-09-27 NOTE — Progress Notes (Signed)
To recovery, report to [place], RN, VSS 

## 2015-09-27 NOTE — Op Note (Signed)
[facility] Patient Name: [name] Procedure Date: 09/27/2015 2:46 PM MRN: [id number] Endoscopist: Docia Chuck. Henrene Pastor , MD Age: 55 Referring MD:  Date of Birth: 04/28/1960 Gender: Female Account #: [id number] Procedure:                Upper GI endoscopy, with Venia Minks dilation-55 F Indications:              Dysphagia Medicines:                Monitored Anesthesia Care Procedure:                Pre-Anesthesia Assessment:                           - Prior to the procedure, a History and Physical                            was performed, and patient medications and                            allergies were reviewed. The patient's tolerance of                            previous anesthesia was also reviewed. The risks                            and benefits of the procedure and the sedation                            options and risks were discussed with the patient.                            All questions were answered, and informed consent                            was obtained. Prior Anticoagulants: The patient has                            taken no previous anticoagulant or antiplatelet                            agents. ASA Grade Assessment: II - A patient with                            mild systemic disease. After reviewing the risks                            and benefits, the patient was deemed in                            satisfactory condition to undergo the procedure.                           After obtaining informed consent, the endoscope was  passed under direct vision. Throughout the                            procedure, the patient's blood pressure, pulse, and                            oxygen saturations were monitored continuously. The                            Model GIF-[id number]) scope was introduced                            through the mouth, and advanced to the second part                            of duodenum. The  upper GI endoscopy was                            accomplished without difficulty. The patient                            tolerated the procedure well. Scope In: Scope Out: Findings:                 One mild benign-appearing, intrinsic stenosis was                            found 35 cm from the incisors. This measured 1.5 cm                            (inner diameter). The scope was withdrawn, after                            the endoscopic service was completed. Dilation was                            then performed with a Maloney dilator with no                            resistance at 28 Fr. No heme. Tolerated well.                           The exam of the esophagus was otherwise normal.                           The stomach was normal.                           The examined duodenum was normal.                           The cardia and gastric fundus were normal on                            retroflexion. Small  hiatal hernia present. Complications:            No immediate complications. Estimated Blood Loss:     Estimated blood loss: none. Impression:               - Benign-appearing esophageal stenosis. Dilated.                           - Normal EGD otherwise. Recommendation:           - Reflux precautions.                           - Resume previous diet.                           - Continue present medications. Continue omeprazole                            40 mg daily to control your acid reflux.                           - GI follow-up in 1 year John N. Henrene Pastor, MD 09/27/2015 3:31:23 PM This report has been signed electronically.

## 2015-09-27 NOTE — Patient Instructions (Signed)
YOU HAD AN ENDOSCOPIC PROCEDURE TODAY AT THE [facility]:   Refer to the procedure report that was given to you for any specific questions about what was found during the examination.  If the procedure report does not answer your questions, please call your gastroenterologist to clarify.  If you requested that your care partner not be given the details of your procedure findings, then the procedure report has been included in a sealed envelope for you to review at your convenience later.  YOU SHOULD EXPECT: Some feelings of bloating in the abdomen. Passage of more gas than usual.  Walking can help get rid of the air that was put into your GI tract during the procedure and reduce the bloating. If you had a lower endoscopy (such as a colonoscopy or flexible sigmoidoscopy) you may notice spotting of blood in your stool or on the toilet paper. If you underwent a bowel prep for your procedure, you may not have a normal bowel movement for a few days.  Please Note:  You might notice some irritation and congestion in your nose or some drainage.  This is from the oxygen used during your procedure.  There is no need for concern and it should clear up in a day or so.  SYMPTOMS TO REPORT IMMEDIATELY:   Following lower endoscopy (colonoscopy or flexible sigmoidoscopy):  Excessive amounts of blood in the stool  Significant tenderness or worsening of abdominal pains  Swelling of the abdomen that is new, acute  Fever of 100F or higher   Following upper endoscopy (EGD)  Vomiting of blood or coffee ground material  New chest pain or pain under the shoulder blades  Painful or persistently difficult swallowing  New shortness of breath  Fever of 100F or higher  Black, tarry-looking stools  For urgent or emergent issues, a gastroenterologist can be reached at any hour by calling [phone].   DIET: Your first meal following the procedure should be a small meal and then it is ok to progress to  your normal diet. Heavy or fried foods are harder to digest and may make you feel nauseous or bloated.  Likewise, meals heavy in dairy and vegetables can increase bloating.  Drink plenty of fluids but you should avoid alcoholic beverages for 24 hours.  ACTIVITY:  You should plan to take it easy for the rest of today and you should NOT DRIVE or use heavy machinery until tomorrow (because of the sedation medicines used during the test).    FOLLOW UP: Our staff will call the number listed on your records the next business day following your procedure to check on you and address any questions or concerns that you may have regarding the information given to you following your procedure. If we do not reach you, we will leave a message.  However, if you are feeling well and you are not experiencing any problems, there is no need to return our call.  We will assume that you have returned to your regular daily activities without incident.  If any biopsies were taken you will be contacted by phone or by letter within the next 1-3 weeks.  Please call us at [phone] if you have not heard about the biopsies in 3 weeks.    SIGNATURES/CONFIDENTIALITY: You and/or your care partner have signed paperwork which will be entered into your electronic medical record.  These signatures attest to the fact that that the information above on your After Visit Summary has been reviewed   and is understood.  Full responsibility of the confidentiality of this discharge information lies with you and/or your care-partner.   Follow dilatation diet today -clear liquids for 1 hour ,then soft foods the rest of today. Resume usual diet tomorrow as tolerated  Reflux precautions -handout on reflux given to you today  Continue Omeprazole 40 mg daily to control reflux   GI follow up  in one year  Information on polyps,diverticulosis,hemorrhoids,and high fiber diet given to you today

## 2015-09-27 NOTE — Progress Notes (Signed)
Called to room to assist during endoscopic procedure.  Patient ID and intended procedure confirmed with present staff. Received instructions for my participation in the procedure from the performing physician.  

## 2015-09-27 NOTE — Op Note (Signed)
[facility] Patient Name: [name] Procedure Date: 09/27/2015 2:46 PM MRN: [id number] Endoscopist: Docia Chuck. Henrene Pastor , MD Age: 55 Referring MD:  Date of Birth: October 12, 1960 Gender: Female Account #: [id number] Procedure:                Colonoscopy, With cold snare polypectomy x 1 Indications:              Screening in patient at increased risk: Colorectal                            cancer in father before age 90. Previous exam 2009                            negative for neoplasia Medicines:                Monitored Anesthesia Care Procedure:                Pre-Anesthesia Assessment:                           - Prior to the procedure, a History and Physical                            was performed, and patient medications and                            allergies were reviewed. The patient's tolerance of                            previous anesthesia was also reviewed. The risks                            and benefits of the procedure and the sedation                            options and risks were discussed with the patient.                            All questions were answered, and informed consent                            was obtained. Prior Anticoagulants: The patient has                            taken no previous anticoagulant or antiplatelet                            agents. ASA Grade Assessment: II - A patient with                            mild systemic disease. After reviewing the risks                            and benefits, the patient was deemed in  satisfactory condition to undergo the procedure.                           After obtaining informed consent, the colonoscope                            was passed under direct vision. Throughout the                            procedure, the patient's blood pressure, pulse, and                            oxygen saturations were monitored continuously. The   Model CF-HQ190L [id number]) scope was introduced                            through the anus and advanced to the the cecum,                            identified by appendiceal orifice and ileocecal                            valve. The ileocecal valve, appendiceal orifice,                            and rectum were photographed. The quality of the                            bowel preparation was excellent. The colonoscopy                            was performed without difficulty. The patient                            tolerated the procedure well. The bowel preparation                            used was SUPREP. Scope In: 3:06:35 PM Scope Out: 3:18:40 PM Scope Withdrawal Time: 0 hours 9 minutes 34 seconds  Total Procedure Duration: 0 hours 12 minutes 5 seconds  Findings:                 A 4 mm polyp was found in the ascending colon. The                            polyp was removed with a cold snare. Resection and                            retrieval were complete.                           Diverticula were found in the sigmoid colon.                           Internal hemorrhoids were found during  retroflexion.                           The exam was otherwise without abnormality on                            direct and retroflexion views. Complications:            No immediate complications. Estimated blood loss:                            None. Estimated Blood Loss:     Estimated blood loss: none. Impression:               - One 4 mm polyp in the ascending colon, removed                            with a cold snare. Resected and retrieved.                           - Diverticulosis in the sigmoid colon.                           - Internal hemorrhoids.                           - The examination was otherwise normal on direct                            and retroflexion views. Recommendation:           - Repeat colonoscopy in 5 years for surveillance.                           -  Patient has a contact number available for                            emergencies. The signs and symptoms of potential                            delayed complications were discussed with the                            patient. Return to normal activities tomorrow.                            Written discharge instructions were provided to the                            patient.                           - Resume previous diet.                           - Continue present medications.                           -  Await pathology results. Docia Chuck. Henrene Pastor, MD 09/27/2015 3:27:49 PM This report has been signed electronically.

## 2015-09-30 ENCOUNTER — Telehealth: Payer: Self-pay

## 2015-09-30 NOTE — Telephone Encounter (Signed)
  Follow up Call-  Call back number 09/27/2015  Post procedure Call Back phone  # [phone]  Permission to leave phone message Yes  Some recent data might be hidden    Patient was called for follow up after her procedure on 09/2015. No answer at the number given for follow up phone call. A message was left on the answering machine.

## 2015-10-08 ENCOUNTER — Encounter: Payer: Self-pay | Admitting: Internal Medicine

## 2015-11-06 ENCOUNTER — Encounter: Payer: Self-pay | Admitting: Podiatry

## 2015-11-06 ENCOUNTER — Ambulatory Visit (INDEPENDENT_AMBULATORY_CARE_PROVIDER_SITE_OTHER): Payer: BLUE CROSS/BLUE SHIELD

## 2015-11-06 ENCOUNTER — Ambulatory Visit (INDEPENDENT_AMBULATORY_CARE_PROVIDER_SITE_OTHER): Payer: BLUE CROSS/BLUE SHIELD | Admitting: Podiatry

## 2015-11-06 VITALS — BP 140/89 | HR 92 | Resp 18

## 2015-11-06 DIAGNOSIS — R52 Pain, unspecified: Secondary | ICD-10-CM | POA: Diagnosis not present

## 2015-11-06 DIAGNOSIS — M79671 Pain in right foot: Secondary | ICD-10-CM

## 2015-11-06 DIAGNOSIS — M79672 Pain in left foot: Secondary | ICD-10-CM | POA: Diagnosis not present

## 2015-11-06 DIAGNOSIS — M25571 Pain in right ankle and joints of right foot: Secondary | ICD-10-CM

## 2015-11-06 DIAGNOSIS — M25572 Pain in left ankle and joints of left foot: Secondary | ICD-10-CM

## 2015-11-06 NOTE — Progress Notes (Signed)
   Subjective:    Patient ID: [name], female    DOB: 06/04/1960, 55 y.o.   MRN: [id number]  HPI I  This patient presents today describing an approximately six-month history of swelling, pain , numbness, tingling rather diffusely upon standing and walking. The symptoms are most pronounced in the morning and can last 1-2 hours. Describes the symptoms starting in her foot and progressing proximally to her knee. The symptoms gradually do tend to dissipate around a persistent. Patient describes a generalized sensitivity at rest even to light bedcovers on her feet and legs. Patient has a history of of arthritis which she currently takes Celebrex , however, as had no effect on her lower extremity symptoms. She also describes the use of a diuretic more recently to control the swelling Patient has a sedentary job  Patient is smoker starting at age 63 describing initially smoking 1 pack a day to more recently half pack daily    Review of Systems  Constitutional:       SWEATING   Eyes: Positive for redness.  Cardiovascular: Positive for leg swelling.       CALF PAIN WITH WALKING  Musculoskeletal: Positive for back pain and gait problem.       JOINT PAIN  Hematological: Bruises/bleeds easily.  All other systems reviewed and are negative.      Objective:   Physical Exam   BP 140/89 Pulse 92 Respiration 18  Orientated 2  Vascular: No calf edema or calf tenderness bilaterally No peripheral edema bilaterally DP and PT pulses 2/4 bilateral  Neurological: Sensation to 10 g monofilament wire intact 5/5 bilaterally Vibratory sensation intact bilaterally Ankle reflex equal and reactive bilaterally  Dermatological: No open skin lesions bilaterally Tattoos lower extremities bilaterally  Musculoskeletal: Upon weight-bearing patient seems to have painful gait Mild palpable tenderness mid fascial bands bilaterally without any palpable lesions Mild palpable tenderness tendo  Achilles bilaterally without any palpable lesions Manual motor testing: Dorsi flexion, plantar flexion, inversion, eversion 5/5 bilaterally  X-ray examination weightbearing right and left feet dated 11/06/2015 X-rays of similar appearance in bone density, physical orientation Pes cavus bilaterally Joint spaces appear to be adequate Cavus foot type bilaterally  No acute bony abnormality noted in the right and left x-rays dated 11/06/2015       Assessment & Plan:   Assessment: Low-grade plantar fasciitis bilaterally Low-grade Achilles tendinitis bilaterally Neurovascular status intact Arthralgia bilaterally undetermined cause Neuropathic symptoms undetermined cause  Plan: I informed patient that I could not give her a definitive diagnosis today. Referring patient to lab for arthritis screening panel as well as metabolic panel, fasting Return patient upon receipt of lab discuss findings with patient  Reappoint 7 days

## 2015-11-06 NOTE — Patient Instructions (Signed)
I'm referring you to the lab for a basic metabolic panel and arthritis panel Do not eat 8 hours prior to your lab test

## 2015-11-08 LAB — BASIC METABOLIC PANEL WITH GFR
BUN: 16 mg/dL (ref 7–25)
CALCIUM: 9.5 mg/dL (ref 8.6–10.4)
CO2: 25 mmol/L (ref 20–31)
CREATININE: 0.91 mg/dL (ref 0.50–1.05)
Chloride: 104 mmol/L (ref 98–110)
GFR, EST AFRICAN AMERICAN: 83 mL/min (ref >=60)
GFR, Est Non African American: 72 mL/min (ref >=60)
GLUCOSE: 109 mg/dL — AB (ref 65–99)
Potassium: 4.5 mmol/L (ref 3.5–5.3)
Sodium: 141 mmol/L (ref 135–146)

## 2015-11-08 LAB — URIC ACID: Uric Acid, Serum: 5.1 mg/dL (ref 2.5–7.0)

## 2015-11-09 LAB — C-REACTIVE PROTEIN: CRP: 1.6 mg/L (ref <8.0)

## 2015-11-09 LAB — SEDIMENTATION RATE: Sed Rate: 1 mm/hr (ref 0–30)

## 2015-11-09 LAB — RHEUMATOID FACTOR: Rhuematoid fact SerPl-aCnc: <14 IU/mL (ref <14)

## 2015-11-11 LAB — ANA: ANA: NEGATIVE

## 2015-11-13 ENCOUNTER — Encounter: Payer: Self-pay | Admitting: Podiatry

## 2015-11-13 ENCOUNTER — Ambulatory Visit (INDEPENDENT_AMBULATORY_CARE_PROVIDER_SITE_OTHER): Payer: BLUE CROSS/BLUE SHIELD | Admitting: Podiatry

## 2015-11-13 VITALS — BP 163/94 | HR 85 | Resp 18

## 2015-11-13 DIAGNOSIS — M25571 Pain in right ankle and joints of right foot: Secondary | ICD-10-CM

## 2015-11-13 DIAGNOSIS — M79672 Pain in left foot: Secondary | ICD-10-CM

## 2015-11-13 DIAGNOSIS — M79671 Pain in right foot: Secondary | ICD-10-CM | POA: Diagnosis not present

## 2015-11-13 DIAGNOSIS — M25572 Pain in left ankle and joints of left foot: Principal | ICD-10-CM

## 2015-11-13 NOTE — Patient Instructions (Signed)
I am not able to give you a specific diagnosis for your pain numbness cramping in your feet The basic metabolic screen was within normal limits with exception of the blood glucose was mildly elevated at 109. The arthritis screening panel always within normal limits. Am referring you to one of my partners to evaluate your pain to see if you give you specific diagnosis. If he is unable to reach a specific diagnosis we will refer you either to a [facility] such as [facility] or [facility] or consider the possibility of a neurology consult

## 2015-11-14 NOTE — Progress Notes (Signed)
Patient ID: [name], female   DOB: [birth year], 55 y.o.   MRN: [id number]    HPI I This patient was initially evaluated on 11/06/2015 and was referred to the lab for a metabolic panel and arthritis screen panel and to review the results of the left and further evaluation On the visit of 11/13/2015 patient describes a continuous cramping at night which she states prevented her from sleeping and is requesting a note to be off work for 1-2-days Patient is also concerned that her father had amyloidosis and was concerned that she also may have this problem  Initial information on the visit of 11/06/2015 This patient presents today describing an approximately six-month history of swelling, pain , numbness, tingling rather diffusely upon standing and walking. The symptoms are most pronounced in the morning and can last 1-2 hours. Describes the symptoms starting in her foot and progressing proximally to her knee. The symptoms gradually do tend to dissipate around a persistent. Patient describes a generalized sensitivity at rest even to light bedcovers on her feet and legs. Patient has a history of of arthritis which she currently takes Celebrex , however, as had no effect on her lower extremity symptoms. She also describes the use of a diuretic more recently to control the swelling Patient has a sedentary job  Patient is smoker starting at age 26 describing initially smoking 1 pack a day to more recently half pack daily  Objective: BP 163/94 Pulse 85 Respiration 18  Metabolic panel dated [id number] Slightly elevated blood glucose 109 Remaining results within normal limits Arthritis screening profile including sedimentation rate, uric acid, C-reactive protein, rheumatoid factor, ANA screen all within normal limits  Orientated 2  Vascular: No calf edema or calf tenderness bilaterally No peripheral edema bilaterally DP and PT pulses 2/4 bilateral  Neurological: Sensation to 10 g  monofilament wire intact 5/5 bilaterally Vibratory sensation intact bilaterally Ankle reflex equal and reactive bilaterally  Dermatological: No open skin lesions bilaterally Tattoos lower extremities bilaterally  Musculoskeletal: Upon weight-bearing patient seems to have painful gait Mild palpable tenderness mid fascial bands bilaterally without any palpable lesions Mild palpable tenderness tendo Achilles bilaterally without any palpable lesions Manual motor testing: Dorsi flexion, plantar flexion, inversion, eversion 5/5 bilaterally  X-ray examination weightbearing right and left feet dated 11/06/2015 X-rays of similar appearance in bone density, physical orientation Pes cavus bilaterally Joint spaces appear to be adequate Cavus foot type bilaterally  No acute bony abnormality noted in the right and left x-rays dated 11/06/2015  Assessment: Low-grade plantar fasciitis bilaterally Low-grade Achilles tendinitis bilaterally Neurovascular status intact Arthralgia bilaterally undetermined cause Neuropathic symptoms undetermined cause  Plan: Today I reviewed the results of the lab with patient. I made her aware that the only lab value that was slightly out of normal range was her blood glucose at 109. I did recommend that she saw her primary care physician about this lab value  I also informed her that I could not give her a specific diagnosis at this time. I offered her a referral to one of my partners to further evaluation consultation for the purpose of determining a specific diagnosis. I suggested that if a [facility] could not give her a specific diagnosis then I would recommend an outside referral including a possible neurological evaluation Issued patient note off work 1-2 days at her request for her above complaint  Reappoint patient for consultation with Dr. Earleen Newport

## 2015-12-02 ENCOUNTER — Encounter: Payer: Self-pay | Admitting: Podiatry

## 2015-12-02 ENCOUNTER — Telehealth: Payer: Self-pay | Admitting: *Deleted

## 2015-12-02 ENCOUNTER — Ambulatory Visit (INDEPENDENT_AMBULATORY_CARE_PROVIDER_SITE_OTHER): Payer: BLUE CROSS/BLUE SHIELD | Admitting: Podiatry

## 2015-12-02 DIAGNOSIS — T148XXA Other injury of unspecified body region, initial encounter: Secondary | ICD-10-CM | POA: Diagnosis not present

## 2015-12-02 DIAGNOSIS — M25474 Effusion, right foot: Secondary | ICD-10-CM | POA: Diagnosis not present

## 2015-12-02 DIAGNOSIS — M722 Plantar fascial fibromatosis: Secondary | ICD-10-CM

## 2015-12-02 DIAGNOSIS — M779 Enthesopathy, unspecified: Secondary | ICD-10-CM | POA: Diagnosis not present

## 2015-12-02 MED ORDER — METHYLPREDNISOLONE 4 MG PO TBPK: ORAL_TABLET | ORAL | 0 refills | Status: DC

## 2015-12-02 NOTE — Progress Notes (Signed)
Subjective: 55 year old female presents the office today for concerns of bilateral foot pain. She the right side is much worse the left side in her right foot still swells quite a bit. She has pain to the entire foot she states the specimen she first gets up in the morning in stays painful throughout the day. She recently had blood work performed. She denies any recent injury or trauma to the skin on the last couple months. She has seen her primary care physician is put her fluid pills tablet swelling to take as needed. She states that she does get a burning pain to both of her feet as well. She doesn't history seen a pain management doctor where she was getting injections in her back.Denies any systemic complaints such as fevers, chills, nausea, vomiting. No acute changes since last appointment, and no other complaints at this time.   Objective: AAO x3, NAD DP/PT pulses palpable bilaterally, CRT less than 3 seconds That has been recently swelling to the right foot she should be pictures today which did show increased swelling to the right foot. There is Taylor's on the plantar medial tubercle of the calcaneus at insertion of plantar fascia. There is mild discomfort on the medial band plantar fasciitis the arch of the foot and the tendon appears to be intact. There is diffuse tenderness of the right foot but I'm unable to elicit any area pinpoint bony tenderness or pain the vibratory sensation. The right second toe does sit in a plantarflexed position and she is unable to dorsiflex the toe. There is no areas of tenderness identified this time.  No open lesions or pre-ulcerative lesions.  No pain with calf compression, swelling, warmth, erythema  Assessment: Right foot plantar fasciitis, rule out plantar fascial tear versus tendon tear.  Plan: -All treatment options discussed with the patient including all alternatives, risks, complications.  -Return x-rays were reviewed. At this time discussed a  steroid injection for which she wishes to proceed. Under sterile conditions much of Kenalog local facet was infiltrated to the plantar medial tubercle of calcaneus and the right side without complications. Post injection care was discussed. Brace was dispensed. Continue stretching and icing exercises daily. -Will order an MRI to rule out tendon tear as she is able to dorsiflex the second toe and also to other pathology. X-rays are negative. This is also for surgical planning of necessary. -Medrol dose pack -Discussed cam boot she cannot wear this at work so we'll hold off until the MRI. -Patient encouraged to call the office with any questions, concerns, change in symptoms.   Celesta Gentile, DPM

## 2015-12-02 NOTE — Telephone Encounter (Addendum)
-----   Message from Trula Slade, DPM sent at 12/02/2015  9:49 AM EDT ----- Can you please order an MRI of the right foot to rule out plantar fascia tear as well as extensor tendon tear.Unable to dorsiflex 2nd toe and sits plantarflexed. 12/02/2015-Faxed orders to [facility].

## 2015-12-08 ENCOUNTER — Ambulatory Visit
Admission: RE | Admit: 2015-12-08 | Discharge: 2015-12-08 | Disposition: A | Discharge status: Home / Self Care | Payer: BLUE CROSS/BLUE SHIELD | Source: Ambulatory Visit | Attending: Podiatry | Admitting: Podiatry

## 2015-12-08 DIAGNOSIS — M79671 Pain in right foot: Secondary | ICD-10-CM | POA: Diagnosis not present

## 2015-12-13 ENCOUNTER — Ambulatory Visit: Payer: BLUE CROSS/BLUE SHIELD | Admitting: Podiatry

## 2015-12-27 ENCOUNTER — Encounter: Payer: Self-pay | Admitting: Podiatry

## 2015-12-27 ENCOUNTER — Ambulatory Visit (INDEPENDENT_AMBULATORY_CARE_PROVIDER_SITE_OTHER): Payer: BLUE CROSS/BLUE SHIELD | Admitting: Podiatry

## 2015-12-27 DIAGNOSIS — M792 Neuralgia and neuritis, unspecified: Secondary | ICD-10-CM | POA: Diagnosis not present

## 2015-12-27 DIAGNOSIS — M722 Plantar fascial fibromatosis: Secondary | ICD-10-CM | POA: Diagnosis not present

## 2015-12-27 NOTE — Patient Instructions (Signed)

## 2015-12-29 NOTE — Progress Notes (Signed)
Subjective: 55 year old female presents the office they for follow-up evaluation of right heel pain discuss MRI results. She states that she still gets pain on the heel but she also feels there is an numb sensation in her leg. She does have history of sciatica as well. She also had a history of back pain and she is getting injections which she can remember. She denies any recent injury or trauma to her feet. Denies any swelling or redness. She has no new complaints today. Denies any systemic complaints such as fevers, chills, nausea, vomiting. No acute changes since last appointment, and no other complaints at this time.   Objective: AAO x3, NAD DP/PT pulses palpable bilaterally, CRT less than 3 seconds There is mild tenderness to palpation on plantar medial tubercle of the calcaneus at insertion of plantar fascial in the right foot. There is no palpable compression there is no pain vibratory sensation. Negative Tinel's sign. There is no other areas of pinpoint laser is a pain in vibratory sensation. No open lesions or pre-ulcerative lesions.  No pain with calf compression, swelling, warmth, erythema  Assessment: Right heel pain, likely plantar fasciitis and possible neuritis.  Plan: -All treatment options discussed with the patient including all alternatives, risks, complications.  -MRI results were discussed the patient. -At this point given her symptoms that she's having numbness to her leg I believe that she would benefit from a nerve conduction test to rule out any nerve pathology. This is what her today. -Discussed with her inserts as well as stretching, icing. She was started on steroid injection. Under fascial brace as well. -Follow-up after nerve conduction test or sooner if needed. -Patient encouraged to call the office with any questions, concerns, change in symptoms.   Celesta Gentile, DPM

## 2015-12-30 NOTE — Addendum Note (Signed)
Addended by: Harriett Sine D on: 12/30/2015 08:26 AM   Modules accepted: Orders

## 2016-01-09 ENCOUNTER — Telehealth: Payer: Self-pay | Admitting: *Deleted

## 2016-01-09 NOTE — Telephone Encounter (Addendum)
Pt states she was to be sent to a neurologist, but has not received a call. I reviewed pt orders and her orders for NCV with EMG had been confirmed received by [facility]. I called [facility] and she states they do not have the order but if I would refax she would contact the pt today. Refaxed orders to [facility]. Left message on pt's requested line [phone], informing [facility] would contact her and left their phone number.

## 2016-01-14 ENCOUNTER — Ambulatory Visit (INDEPENDENT_AMBULATORY_CARE_PROVIDER_SITE_OTHER): Payer: BLUE CROSS/BLUE SHIELD | Admitting: Neurology

## 2016-01-14 DIAGNOSIS — M792 Neuralgia and neuritis, unspecified: Secondary | ICD-10-CM

## 2016-01-14 DIAGNOSIS — R202 Paresthesia of skin: Secondary | ICD-10-CM

## 2016-01-14 NOTE — Procedures (Signed)
[facility]  [address]  [place], [place] [address] Tel: [phone] Fax:  [phone] Test Date:  01/14/2016  Patient: [name] DOB: Jun 18, 1960 Physician: Narda Amber, DO  Sex: Female Height: 5\' 3"  Ref Phys: Celesta Gentile, DPM  ID#: [id number] Temp: 32.4C Technician: Jerilynn Mages. Dean   Patient Complaints: This is a 55 year old female referred for evaluation of bilateral heel and foot pain and paresthesias, worse on the right.   NCV & EMG Findings: Extensive electrodiagnostic testing of the right lower extremity and additional studies of the left shows:  1. Bilateral sural and superficial peroneal sensory responses are within normal limits. 2. Bilateral peroneal and tibial motor responses are within normal limits. 3. Bilateral tibial H reflex studies are within normal limits. 4. There is no evidence of active or chronic motor axon loss changes affecting any of the tested muscles. Motor unit configuration and recruitment pattern is within normal limits.  Impression: This is a normal study. In particular, there is no evidence of a sensorimotor polyneuropathy or lumbosacral radiculopathy affecting the lower extremities.   ___________________________ Narda Amber, DO    Nerve Conduction Studies Anti Sensory Summary Table   Site NR Peak (ms) Norm Peak (ms) P-T Amp (V) Norm P-T Amp  Left Sup Peroneal Anti Sensory (Ant Lat Mall)  32.4C  12 cm    2.8 <4.6 15.4 >4  Right Sup Peroneal Anti Sensory (Ant Lat Mall)  32.4C  12 cm    3.3 <4.6 14.3 >4  Left Sural Anti Sensory (Lat Mall)  32.4C  Calf    4.5 <4.6 10.2 >4  Right Sural Anti Sensory (Lat Mall)  32.4C  Calf    3.3 <4.6 10.1 >4   Motor Summary Table   Site NR Onset (ms) Norm Onset (ms) O-P Amp (mV) Norm O-P Amp Site1 Site2 Delta-0 (ms) Dist (cm) Vel (m/s) Norm Vel (m/s)  Left Peroneal Motor (Ext Dig Brev)  32.4C  Ankle    3.6 <6.0 3.5 >2.5 B Fib Ankle 7.7 38.0 49 >40  B Fib    11.3  3.4   Poplt B Fib 1.4 8.0 57 >40  Poplt    12.7  3.2         Right Peroneal Motor (Ext Dig Brev)  32.4C  Ankle    3.3 <6.0 4.5 >2.5 B Fib Ankle 6.4 32.0 50 >40  B Fib    9.7  4.4  Poplt B Fib 1.6 10.0 62 >40  Poplt    11.3  4.3         Left Tibial Motor (Abd Hall Brev)  32.4C  Ankle    3.4 <6.0 14.5 >4 Knee Ankle 8.9 40.0 45 >40  Knee    12.3  11.1         Right Tibial Motor (Abd Hall Brev)  32.4C  Ankle    2.7 <6.0 13.9 >4 Knee Ankle 8.2 38.0 46 >40  Knee    10.9  9.3          H Reflex Studies   NR H-Lat (ms) Lat Norm (ms) L-R H-Lat (ms) M-Lat (ms) HLat-MLat (ms)  Left Tibial (Gastroc)  32.4C     32.24 <35 0.00 4.76 27.48  Right Tibial (Gastroc)  32.4C     32.24 <35 0.00 4.76 27.48   EMG   Side Muscle Ins Act Fibs Psw Fasc Number Recrt Dur Dur. Amp Amp. Poly Poly. Comment  Right AntTibialis Nml Nml Nml Nml Nml Nml Nml Nml  Nml Nml Nml Nml N/A  Right Gastroc Nml Nml Nml Nml Nml Nml Nml Nml Nml Nml Nml Nml N/A  Right Flex Dig Long Nml Nml Nml Nml 1- Mod-V Nml Nml Nml Nml Nml Nml N/A  Right RectFemoris Nml Nml Nml Nml Nml Nml Nml Nml Nml Nml Nml Nml N/A  Right GluteusMed Nml Nml Nml Nml Nml Nml Nml Nml Nml Nml Nml Nml N/A  Right BicepsFemS Nml Nml Nml Nml Nml Nml Nml Nml Nml Nml Nml Nml N/A  Left AntTibialis Nml Nml Nml Nml Nml Nml Nml Nml Nml Nml Nml Nml N/A  Left Gastroc Nml Nml Nml Nml Nml Nml Nml Nml Nml Nml Nml Nml N/A  Left Flex Dig Long Nml Nml Nml Nml 1- Mod-V Nml Nml Nml Nml Nml Nml N/A      Waveforms:

## 2016-01-24 ENCOUNTER — Ambulatory Visit: Payer: BLUE CROSS/BLUE SHIELD | Admitting: Podiatry

## 2016-01-25 ENCOUNTER — Other Ambulatory Visit: Payer: Self-pay | Admitting: Family Medicine

## 2016-01-31 ENCOUNTER — Ambulatory Visit (INDEPENDENT_AMBULATORY_CARE_PROVIDER_SITE_OTHER): Payer: BLUE CROSS/BLUE SHIELD | Admitting: Podiatry

## 2016-01-31 ENCOUNTER — Encounter: Payer: Self-pay | Admitting: Podiatry

## 2016-01-31 DIAGNOSIS — M722 Plantar fascial fibromatosis: Secondary | ICD-10-CM | POA: Diagnosis not present

## 2016-02-03 NOTE — Progress Notes (Addendum)
Subjective: 55 year old female presents the office they for follow-up evaluation of right heel pain discuss NCV results. She states that she still gets pain to the right heel and she describes as a stinging/stabbing sensations bottom of her heel. She gets some pain in the side of her foot as well. The pain is intermittent other day. The pain is not consistent. The pain does get worse that activity. No recent injury. As the swelling redness. The pain does not wake her up at night. No flights this time.  Objective: AAO x3, NAD DP/PT pulses palpable bilaterally, CRT less than 3 seconds There is mild tenderness to palpation on plantar medial tubercle of the calcaneus at insertion of plantar fascial in the right foot. There is no palpable compression there is no pain vibratory sensation. Negative Tinel's sign. There is no other areas of pinpoint tenderness or pain with vibratory sensation. Overally, exam is unchanged.  No open lesions or pre-ulcerative lesions.  No pain with calf compression, swelling, warmth, erythema  Assessment: Right heel pain, likely plantar fasciitis  Plan: -All treatment options discussed with the patient including all alternatives, risks, complications.  -I discussed both the MRI results as well as the nerve conduction studies with the patient. A cement with her pain is really unusual presentation of plantar fasciitis and we will treat that at this time. She understands this and she wishes to proceed. -Patient elects to proceed with steroid injection into the right heel. Under sterile skin preparation, a total of 2.5cc of kenalog 10, 0.5% Marcaine plain, and 2% lidocaine plain were infiltrated into the symptomatic area without complication. A band-aid was applied. Patient tolerated the injection well without complication. Post-injection care with discussed with the patient. Discussed with the patient to ice the area over the next couple of days to help prevent a steroid flare.   -Prescribed medrol dose pack. Discussed side effects of the medication and directed to stop if any are to occur and call the office.  -Stretching and icing daily  -Discussed orthotics and shoe changes. She was scanned and orthotics were sent to [facility] labs.  -Plantar fascial brace.  -Follow-up in 3 weeks or sooner if any problems arise. In the meantime, encouraged to call the office with any questions, concerns, change in symptoms.   [name], DPM

## 2016-02-21 DIAGNOSIS — Z8709 Personal history of other diseases of the respiratory system: Secondary | ICD-10-CM | POA: Diagnosis not present

## 2016-02-21 DIAGNOSIS — J029 Acute pharyngitis, unspecified: Secondary | ICD-10-CM | POA: Diagnosis not present

## 2016-02-21 DIAGNOSIS — J0141 Acute recurrent pansinusitis: Secondary | ICD-10-CM | POA: Diagnosis not present

## 2016-02-21 DIAGNOSIS — B373 Candidiasis of vulva and vagina: Secondary | ICD-10-CM | POA: Diagnosis not present

## 2016-02-27 ENCOUNTER — Ambulatory Visit: Payer: BLUE CROSS/BLUE SHIELD

## 2016-02-27 DIAGNOSIS — M722 Plantar fascial fibromatosis: Secondary | ICD-10-CM

## 2016-02-27 NOTE — Patient Instructions (Signed)

## 2016-03-17 DIAGNOSIS — N6311 Unspecified lump in the right breast, upper outer quadrant: Secondary | ICD-10-CM | POA: Diagnosis not present

## 2016-03-17 DIAGNOSIS — N6313 Unspecified lump in the right breast, lower outer quadrant: Secondary | ICD-10-CM | POA: Diagnosis not present

## 2016-03-23 ENCOUNTER — Other Ambulatory Visit: Payer: Self-pay | Admitting: Radiology

## 2016-03-23 DIAGNOSIS — R591 Generalized enlarged lymph nodes: Secondary | ICD-10-CM | POA: Diagnosis not present

## 2016-03-23 DIAGNOSIS — N6021 Fibroadenosis of right breast: Secondary | ICD-10-CM | POA: Diagnosis not present

## 2016-03-23 DIAGNOSIS — C50411 Malignant neoplasm of upper-outer quadrant of right female breast: Secondary | ICD-10-CM | POA: Diagnosis not present

## 2016-03-23 DIAGNOSIS — C50911 Malignant neoplasm of unspecified site of right female breast: Secondary | ICD-10-CM | POA: Diagnosis not present

## 2016-03-23 DIAGNOSIS — C50912 Malignant neoplasm of unspecified site of left female breast: Secondary | ICD-10-CM | POA: Diagnosis not present

## 2016-03-23 DIAGNOSIS — N6011 Diffuse cystic mastopathy of right breast: Secondary | ICD-10-CM | POA: Diagnosis not present

## 2016-03-23 DIAGNOSIS — C779 Secondary and unspecified malignant neoplasm of lymph node, unspecified: Secondary | ICD-10-CM | POA: Diagnosis not present

## 2016-03-25 ENCOUNTER — Telehealth: Payer: Self-pay | Admitting: *Deleted

## 2016-03-25 ENCOUNTER — Other Ambulatory Visit: Payer: Self-pay | Admitting: Radiology

## 2016-03-25 DIAGNOSIS — C50411 Malignant neoplasm of upper-outer quadrant of right female breast: Secondary | ICD-10-CM | POA: Diagnosis not present

## 2016-03-25 DIAGNOSIS — N6021 Fibroadenosis of right breast: Secondary | ICD-10-CM | POA: Diagnosis not present

## 2016-03-25 DIAGNOSIS — C50912 Malignant neoplasm of unspecified site of left female breast: Secondary | ICD-10-CM | POA: Diagnosis not present

## 2016-03-25 DIAGNOSIS — C50911 Malignant neoplasm of unspecified site of right female breast: Secondary | ICD-10-CM | POA: Diagnosis not present

## 2016-03-25 NOTE — Telephone Encounter (Signed)
Confirmed [facility] for 04/01/16 at 8:15am .  Instructions and contact information given.

## 2016-03-27 ENCOUNTER — Other Ambulatory Visit: Payer: Self-pay | Admitting: *Deleted

## 2016-03-27 ENCOUNTER — Other Ambulatory Visit: Payer: Self-pay | Admitting: Radiology

## 2016-03-27 DIAGNOSIS — Z170 Estrogen receptor positive status [ER+]: Secondary | ICD-10-CM

## 2016-03-27 DIAGNOSIS — C50911 Malignant neoplasm of unspecified site of right female breast: Secondary | ICD-10-CM

## 2016-03-27 DIAGNOSIS — C50411 Malignant neoplasm of upper-outer quadrant of right female breast: Secondary | ICD-10-CM

## 2016-03-29 ENCOUNTER — Ambulatory Visit
Admission: RE | Admit: 2016-03-29 | Discharge: 2016-03-29 | Disposition: A | Discharge status: Home / Self Care | Payer: BLUE CROSS/BLUE SHIELD | Source: Ambulatory Visit | Attending: Radiology | Admitting: Radiology

## 2016-03-29 DIAGNOSIS — C50911 Malignant neoplasm of unspecified site of right female breast: Secondary | ICD-10-CM

## 2016-03-29 MED ORDER — GADOBENATE DIMEGLUMINE 529 MG/ML IV SOLN
15.0000 mL | Freq: Once | INTRAVENOUS | Status: AC | PRN
  Administered 2016-03-29: 15 mL via INTRAVENOUS

## 2016-03-30 NOTE — Progress Notes (Signed)
Patient presents for orthotic pick up.  Verbal and written break in and wear instructions given.  Patient will follow up in 4 weeks if symptoms worsen or fail to improve. 

## 2016-03-30 NOTE — Progress Notes (Signed)
[place]  [phone] Fax:([phone]) [place] Note   Patient Care Team: Delia Chimes, NP as PCP - General (Nurse Practitioner) Magnus Sinning, MD (Inactive) (Orthopedic Surgery) Rutherford Guys, MD (Ophthalmology) Lavonna Monarch, MD (Dermatology) Truitt Merle, MD as Consulting Physician (Hematology) Rolm Bookbinder, MD as Consulting Physician (General Surgery) Kyung Rudd, MD as Consulting Physician (Radiation Oncology) 04/01/2016  CHIEF COMPLAINTS/PURPOSE OF CONSULTATION:  Newly diagnosed right breast cancer  Oncology History   Cancer Staging Malignant neoplasm of overlapping sites of right breast in female, estrogen receptor positive ([place]) Staging form: Breast, AJCC 8th Edition - Clinical stage from 03/23/2016: Stage IIA (cT3, cN1, cM0, G2, ER: Positive, PR: Positive, HER2: Negative) - Signed by Truitt Merle, MD on 03/31/2016       Malignant neoplasm of overlapping sites of right breast in female, estrogen receptor positive ([place])   03/17/2016 Mammogram    B/l diagnostic mammogram and righ US showed a 3.6cm irregular mass in the right breast 11:00 position, posterior depth, there is a enlarged lymph node in the right axilla is highly suspicious for malignancy. additional 7 mm oval mass in the right breast lower outer quadrant is suspicious for malignancy.      03/23/2016 Initial Biopsy    Right breast 9:30 position biopsy showed invasive ductal carcinoma, grade 1. 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      03/23/2016 Receptors her2    Both breast and node biopsy tumor ER 100% positive, PR 70-95% positive, HER-2 negative, Ki-67 40%      03/23/2016 Initial Diagnosis    Malignant neoplasm of upper-outer quadrant of right breast in female, estrogen receptor positive ([place])      03/25/2016 Initial Biopsy    Right breast 11:00 position core needle biopsy showed invasive duct carcinoma, grade 2.       03/25/2016  Receptors her2    ER 95% positive, PR 90% positive, HER-2 negative, Ki-67 15%      03/30/2016 Imaging    Bilateral breast MRI with and without contrast showed a large lobulated enhancing mass within the upper-outer and lower outer right breast with surrounding nodularity, measuring 6.1 x 4.4 x 5.6 cm. Multiple critically sick and right axillary lymph nodes are demonstrated measuring up to 1.5 cm.        HISTORY OF PRESENTING ILLNESS (04/01/2016):  [name] 56 y.o. female is here because of recent diagnosis of right breast carcinoma with metastatic adenopathy. She is accompanied by her husband and sister to our multidisciplinary breast clinic today.  She had right breast cystic lesion in the past and was aspirated/biopsied, a total of 3 times in the past, she has beeing doing annual mammogram but did not have it in 2017.   She felt a right breast lump one months ago, and was seen by her PCP. She also report sighicant pain at the breat mass site, persistent but fluctuates,  sometime10/10, no skin change or nipple discharge.  She has chronic back pain, she takes vocodin as needed, not very often. she also report anxiety and anorexia since the cancer diagnosis.   Biopsy of right breast at 9:30 o'clock position on 03/23/16 showed invasive mammary carcinoma. Biopsy of the right breast at the 11:00 o'clock position showed fibrocystic change and dense fibrosis with no malignancy identified. Right lymph node was positive for metastatic mammary carcinoma. Pathology revealed carcinoma appearing grade 1, ER/PR positive, HER-2 negative, Ki-67 40%. E-cadherin is positive, consistent with a ductal phenotype.  Repeat biopsy of the right breast at the 11:00 o'clock position on 03/25/16 showed invasive mammary carcinoma. Pathology revealed carcinoma appearing grade 2. An E-Cadherin stain was performed revealing that some tumor cells (approximately 10%) are positive for E-Cadherin, supporting at least a focal  ductal phenotype. The remainder of the tumor cells appear negative for E-Cadherin.  Bilateral breast MRI on 03/29/16 showed large irregular masslike area of enhancement within the upper outer and lower outer right breast compatible with biopsy-proven malignancy. Also seen were enlarged right axillary lymph nodes compatible with metastatic adenopathy.  She had hysterectomy due to a neuralgia in 2011, has had menopause symptoms (hot flashes and mood swing) for over a year now, overall tolerable. But she has been quite anxious since her cancer diagnosis, does not sleep well. Her appetite has also dropped since her cancer diagnosis.  GYN HISTORY  Menarchal: 10 LMP: 11/06/2009 (hysterectomy)  Contraceptive: yes, <5 years  HRT: non G2P2: two daughters age of 50 and 27, no breast feeding    MEDICAL HISTORY:  Past Medical History:  Diagnosis Date  . Anxiety   . Anxiety disorder   . Asthma    h/o asthma as a child  . Cholelithiasis   . Diverticulosis   . DJD (degenerative joint disease)   . Epigastric abdominal pain   . Esophageal stricture   . Family hx of colon cancer   . 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 Ganglion cyst   . GERD (gastroesophageal reflux disease)   . Hemorrhoid   . Hypothyroidism   . PONV (postoperative nausea and vomiting)     SURGICAL HISTORY: Past Surgical History:  Procedure Laterality Date  . ABDOMINAL HYSTERECTOMY    . CESAREAN SECTION    . CHOLECYSTECTOMY    . HEMORRHOID SURGERY    . LAPAROSCOPIC BILATERAL SALPINGECTOMY N/A 10/26/2012   Procedure: operative laparoscopy with lysis of adhesions;  Surgeon: Thornell Sartorius, MD;  Location: [facility];  Service: Gynecology;  Laterality: N/A;  . TUBAL LIGATION    . urologic surgery for ureteropelvic junction obstruction      SOCIAL HISTORY: Social History   Social History  . Marital status: Married    Spouse name: N/A  . Number of children: 2  . Years of education: 14   Occupational History  . auditor,  [employer]enger transport manager [employer] History Main Topics  . Smoking status: Current Every Day Smoker    Packs/day: 0.50    Years: 15.00    Types: Cigarettes  . Smokeless tobacco: Never Used  . Alcohol use 8.4 oz/week    12 Cans of beer, 2 Standard drinks or equivalent per week     Comment: moderate, daily   . Drug use: No  . Sexual activity: Yes    Partners: Male   Other Topics Concern  . Not on file   Social History Narrative   HSG, some community college. .Married -'81.   2 daughters  '83, '88 daughter  with bipolar dz. Has had behavior issues. 1 granddaughter '05 living with her. Occupation:Bank worker. Marriage in good. No history of abuse.                   FAMILY HISTORY: Family History  Problem Relation Age of Onset  . COPD Mother   . Cancer Mother   . Diabetes Father   . Heart disease Father   . Hypertension Father   . Cancer Father     colon cancer  .  Hypertension Sister   . Depression Sister   . Arthritis Sister   . Cancer Paternal Grandmother     ? breast cancer    ALLERGIES:  is allergic to adhesive [tape] and promethazine hcl.  MEDICATIONS:  Current Outpatient Prescriptions  Medication Sig Dispense Refill  . ALPRAZolam (XANAX) 1 MG tablet Take 1 tablet (1 mg total) by mouth 3 (three) times daily. 90 tablet 0  . celecoxib (CELEBREX) 200 MG capsule Take 1 capsule (200 mg total) by mouth daily. 30 capsule 2  . HYDROcodone-acetaminophen (NORCO/VICODIN) 5-325 MG per tablet TAKE 1 TABLET BY MOUTH EVERY 6 HOURS AS NEEDED FOR PAIN 120 tablet 3  . levothyroxine (SYNTHROID, LEVOTHROID) 50 MCG tablet TAKE 1 TABLET EVERY DAY 90 tablet 2  . omeprazole (PRILOSEC) 40 MG capsule Take 1 capsule (40 mg total) by mouth daily. 30 capsule 11  . furosemide (LASIX) 20 MG tablet Take 20 mg by mouth as needed.    . venlafaxine XR (EFFEXOR XR) 37.5 MG 24 hr capsule Take 1 capsule (37.5 mg total) by mouth daily with breakfast. 30 capsule 2   Current  Facility-Administered Medications  Medication Dose Route Frequency Provider Last Rate Last Dose  . 0.9 %  sodium chloride infusion  500 mL Intravenous Continuous Irene Shipper, MD        REVIEW OF SYSTEMS:   Constitutional: Denies fevers, chills or abnormal night sweats Eyes: Denies blurriness of vision, double vision or watery eyes Ears, nose, mouth, throat, and face: Denies mucositis or sore throat Respiratory: Denies cough, dyspnea or wheezes Cardiovascular: Denies palpitation, chest discomfort or lower extremity swelling Gastrointestinal:  Denies nausea, heartburn or change in bowel habits Skin: Denies abnormal skin rashes Lymphatics: Denies new lymphadenopathy or easy bruising Neurological:Denies numbness, tingling or new weaknesses Behavioral/Psych: Mood is stable, no new changes  All other systems were reviewed with the patient and are negative.  PHYSICAL EXAMINATION: ECOG PERFORMANCE STATUS: 0 - Asymptomatic  Vitals:   04/01/16 0831  BP: (!) 147/82  Pulse: 86  Resp: 18  Temp: 97.8 F (36.6 C)   Filed Weights   04/01/16 0831  Weight: 168 lb 11.2 oz (76.5 kg)    GENERAL:alert, no distress and comfortable SKIN: skin color, texture, turgor are normal, no rashes or significant lesions EYES: normal, conjunctiva are pink and non-injected, sclera clear OROPHARYNX:no exudate, no erythema and lips, buccal mucosa, and tongue normal  NECK: supple, thyroid normal size, non-tender, without nodularity LYMPH:  no palpable lymphadenopathy in the cervical, axillary or inguinal LUNGS: clear to auscultation and percussion with normal breathing effort HEART: regular rate & rhythm and no murmurs and no lower extremity edema ABDOMEN:abdomen soft, non-tender and normal bowel sounds Musculoskeletal:no cyanosis of digits and no clubbing  PSYCH: alert & oriented x 3 with fluent speech NEURO: no focal motor/sensory deficits Breasts: Breast inspection showed them to be symmetrical with no  nipple discharge. Palpation of the right breast showed a 3.5X5cm mass in the upper outer quadrant, nontender. Palpation of the left breast and bilateral axillas revealed no obvious mass that I could appreciate.   LABORATORY DATA:  I have reviewed the data as listed CBC Latest Ref Rng & Units 04/01/2016 04/10/2013 10/25/2012  WBC 3.9 - 10.3 10e3/uL 6.0 - 6.8  Hemoglobin 11.6 - 15.9 g/dL 14.7 15.4(H) 14.7  Hematocrit 34.8 - 46.6 % 44.9 47.1(H) 42.9  Platelets 145 - 400 10e3/uL 203 - 209    CMP Latest Ref Rng & Units 04/01/2016 11/06/2015 04/10/2013  Glucose 70 -  140 mg/dl 96 109(H) 97  BUN 7.0 - 26.0 mg/dL 16.0 16 10  Creatinine 0.6 - 1.1 mg/dL 0.9 0.91 0.9  Sodium 136 - 145 mEq/L 139 141 141  Potassium 3.5 - 5.1 mEq/L 3.9 4.5 3.7  Chloride 98 - 110 mmol/L - 104 104  CO2 22 - 29 mEq/L '27 25 29  ' Calcium 8.4 - 10.4 mg/dL 9.3 9.5 9.4  Total Protein 6.4 - 8.3 g/dL 6.6 - -  Total Bilirubin 0.20 - 1.20 mg/dL 0.64 - -  Alkaline Phos 40 - 150 U/L 92 - -  AST 5 - 34 U/L 20 - -  ALT 0 - 55 U/L 21 - -   PATHOLOGY  Diagnosis 03/23/2016 1. Breast, right, needle core biopsy, 9:30 o'clock - INVASIVE MAMMARY CARCINOMA, SEE COMMENT. 2. Breast, right, needle core biopsy, 11:00 o'clock - FIBROCYSTIC CHANGE AND DENSE FIBROSIS. - NO MALIGNANCY IDENTIFIED. 3. Lymph node, needle/core biopsy, right - METASTATIC MAMMARY CARCINOMA. Microscopic Comment 1. While grading is best performed on the resection specimen, the carcinoma appears grade 1. While a ductal phenotype is favored, an e-cadherin will be performed and reported in an add-on. Prognostic markers will be ordered. Dr. Saralyn Pilar has reviewed all 3 parts of the case. The case was called to Dr. Marcelo Baldy on 03/24/2016.  1. PROGNOSTIC INDICATORS Results: IMMUNOHISTOCHEMICAL AND MORPHOMETRIC ANALYSIS PERFORMED MANUALLY Estrogen Receptor: 100%, POSITIVE, STRONG STAINING INTENSITY Progesterone Receptor: 70%, POSITIVE, STRONG STAINING INTENSITY Proliferation  Marker Ki67: 40%  Results: HER2 - NEGATIVE RATIO OF HER2/CEP17 SIGNALS 1.17 AVERAGE HER2 COPY NUMBER PER CELL 2.10  3. PROGNOSTIC INDICATORS Results: IMMUNOHISTOCHEMICAL AND MORPHOMETRIC ANALYSIS PERFORMED MANUALLY Estrogen Receptor: 100%, POSITIVE, STRONG STAINING INTENSITY Progesterone Receptor: 95%, POSITIVE, STRONG STAINING INTENSITY Proliferation Marker Ki67: 40% HER2 - NEGATIVE  1. E-cadherin is positive, consistent with a ductal phenotype.  Diagnosis 03/25/2016 Breast, right, needle core biopsy, 11:00 o'clock - INVASIVE MAMMARY CARCINOMA. - SEE COMMENT. Microscopic Comment The carcinoma appears grade 2. An E-cadherin stain will be performed and the results reported separately. A breast prognostic profile will be performed and the results reported separately. The results were called to [facility] on 03/26/16. (JBK:gt, 03/26/16) An E-Cadherin stain was performed revealing that some tumor cells (approximately 10%) are positive for E-Cadherin, supporting at least a focal ductal phenotype. The remainder of the tumor cells appear negative for E-Cadherin. (JBK:kh 03/27/16)  Results: IMMUNOHISTOCHEMICAL AND MORPHOMETRIC ANALYSIS PERFORMED MANUALLY Estrogen Receptor: 95%, POSITIVE, STRONG STAINING INTENSITY Progesterone Receptor: 90%, POSITIVE, STRONG STAINING INTENSITY Proliferation Marker Ki67: 15%  Results: HER2 - NEGATIVE RATIO OF HER2/CEP17 SIGNALS 1.12 AVERAGE HER2 COPY NUMBER PER CELL 1.90  RADIOGRAPHIC STUDIES: I have personally reviewed the radiological images as listed and agreed with the findings in the report. Mr Breast Bilateral W Wo Contrast  Result Date: 03/30/2016 CLINICAL DATA:  Patient with recent diagnosis of right breast carcinoma with metastatic adenopathy. Multiple biopsy marking clips (6) are demonstrated within the right breast. Additionally a biopsy marking clip is demonstrated within the right axilla. EXAM: BILATERAL BREAST MRI WITH AND WITHOUT  CONTRAST TECHNIQUE: Multiplanar, multisequence MR images of both breasts were obtained prior to and following the intravenous administration of 15 ml of MultiHance. THREE-DIMENSIONAL MR IMAGE RENDERING ON INDEPENDENT WORKSTATION: Three-dimensional MR images were rendered by post-processing of the original MR data on an independent workstation. The three-dimensional MR images were interpreted, and findings are reported in the following complete MRI report for this study. Three dimensional images were evaluated at the independent Dyna[employer]ad workstation COMPARISON:  Previous exam(s).  FINDINGS: Breast composition: b.  Scattered fibroglandular tissue. Background parenchymal enhancement: Moderate. Right breast: There is a large lobular enhancing mass within the upper outer and lower outer right breast with multiple associated bands of enhancement and surrounding nodules. In total this irregular masslike area of enhancement with associated surrounding nodularity measures 6.1 x 4.4 x 5.6 cm. Multiple areas of susceptibility artifact are demonstrated throughout this masslike area of enhancement compatible with biopsy marking clips. No additional concerning areas enhancement identified within the right breast. Left breast: No mass or abnormal enhancement. Lymph nodes: Multiple cortically thickened right axillary lymph nodes are demonstrated measuring up to 1.5 cm (image 29; series 9). Ancillary findings:  None. IMPRESSION: Large irregular masslike area of enhancement within the upper outer and lower outer right breast compatible with biopsy-proven malignancy. Enlarged right axillary lymph nodes compatible with metastatic adenopathy. RECOMMENDATION: Treatment plan for known right breast malignancy. BI-RADS CATEGORY  6: Known biopsy-proven malignancy. Electronically Signed   By: Lovey Newcomer M.D.   On: 03/30/2016 12:02   MR Breast Bilateral w wo contrast 03/29/16 IMPRESSION: Large irregular masslike area of enhancement within the  upper outer and lower outer right breast compatible with biopsy-proven malignancy. Enlarged right axillary lymph nodes compatible with metastatic adenopathy.  ASSESSMENT & PLAN: 56 y.o.  perimenopausal woman, presented with a palpable right breast mass.  1.  Malignant new overlapping sites of right breast, cT3N1M0, stage IIA, ER positive, PR positive, HER-2 negative ---We discussed her imaging findings and the biopsy results in great details. -Giving the large primary breast cancer, she likely need a mastectomy. She is agreeable with that. She was seen by Dr. Donne Hazel today and surgical options were discussed. -We discussed the risk of cancer recurrence after surgery, which is predicted by the staging and pathology of the tumor.  -I recommend a Mammaprint on the core biopsy sample and we'll make a decision about neoadjuvant chemotherapy based on the test result. Written material of this test was given to her. She is young and fit, would be a good candidate for chemotherapy if her mammaprint shows high risk disease.  -We discussed the benefit of neoadjuvant versus adjuvant, which can potentially shrink her tumor, and she may possible can have lumpectomy if she has excellent response to chemotherapy. -Giving the strong ER and PR expression, I recommend adjuvant endocrine therapy with tamoxifen or aromatase inhibitor for a total of 5-10 years to reduce the risk of cancer recurrence. She has a hysterectomy, I will check her estradiol and FSH to determine if she is truly postmenopausal. If she is menopause, I'll recommend aromatase inhibitor. She does have moderate arthritis, I suggest her try exemestane first. --The potential benefit and side effects, which includes but not limited to, hot flash, skin and vaginal dryness, metabolic changes ( increased blood glucose, cholesterol, weight, etc.), slightly in increased risk of cardiovascular disease, cataracts, muscular and joint discomfort, osteopenia and  osteoporosis, etc, were discussed with her in great details. She is interested, she will start this week when she is waiting for the mammaprint result. -She was also seen by radiation oncologist Dr. Lisbeth Renshaw today. Due to her positive lymph nodes, she would need post mastectomy radiation.  -We also discussed the breast cancer surveillance after her surgery. She will continue annual screening mammogram, self exam, and a routine office visit with lab and exam with [place]. -I encouraged her to have healthy diet and exercise regularly.    Plan: -lab test of estradiol and FSH, to determine if she is  truly postmenopausal -If she is postmenopausal, I'll call in exemestane to her [place] on her core biopsy sample, to determine if she needs neoadjuvant chemotherapy -I will see her back in 3 weeks if mammaprint shows high risk, otherwise I'll see her after surgery.   Orders Placed This Encounter  Procedures  . FSH-Follicle stimulating hormone    Standing Status:   Future    Number of Occurrences:   1    Standing Expiration Date:   04/01/2017  . Estradiol    Standing Status:   Future    Number of Occurrences:   1    Standing Expiration Date:   04/01/2017    All questions were answered. The patient knows to call the clinic with any problems, questions or concerns. I spent 55 minutes counseling the patient face to face. The total time spent in the appointment was 60 minutes and more than 50% was on counseling.     Truitt Merle, MD 04/01/2016   Addendum Her lab test of estradiol and FSH showed she is postmenopausal, I called in exemestane to her pharmacy today, and informed patient. She is going to start tomorrow.  Truitt Merle  04/03/2016

## 2016-03-31 DIAGNOSIS — C50811 Malignant neoplasm of overlapping sites of right female breast: Secondary | ICD-10-CM | POA: Insufficient documentation

## 2016-03-31 DIAGNOSIS — Z170 Estrogen receptor positive status [ER+]: Secondary | ICD-10-CM

## 2016-04-01 ENCOUNTER — Ambulatory Visit: Payer: BLUE CROSS/BLUE SHIELD | Attending: General Surgery | Admitting: Physical Therapy

## 2016-04-01 ENCOUNTER — Ambulatory Visit: Payer: BLUE CROSS/BLUE SHIELD | Admitting: Hematology

## 2016-04-01 ENCOUNTER — Other Ambulatory Visit (HOSPITAL_BASED_OUTPATIENT_CLINIC_OR_DEPARTMENT_OTHER): Payer: BLUE CROSS/BLUE SHIELD

## 2016-04-01 ENCOUNTER — Encounter: Payer: Self-pay | Admitting: Hematology

## 2016-04-01 ENCOUNTER — Encounter: Payer: Self-pay | Admitting: Physical Therapy

## 2016-04-01 ENCOUNTER — Ambulatory Visit: Payer: BLUE CROSS/BLUE SHIELD

## 2016-04-01 ENCOUNTER — Encounter: Payer: Self-pay | Admitting: *Deleted

## 2016-04-01 ENCOUNTER — Ambulatory Visit (HOSPITAL_BASED_OUTPATIENT_CLINIC_OR_DEPARTMENT_OTHER): Payer: BLUE CROSS/BLUE SHIELD | Admitting: Hematology

## 2016-04-01 ENCOUNTER — Telehealth: Payer: Self-pay | Admitting: *Deleted

## 2016-04-01 ENCOUNTER — Ambulatory Visit
Admission: RE | Admit: 2016-04-01 | Discharge: 2016-04-01 | Disposition: A | Discharge status: Home / Self Care | Payer: BLUE CROSS/BLUE SHIELD | Source: Ambulatory Visit | Attending: Radiation Oncology | Admitting: Radiation Oncology

## 2016-04-01 VITALS — BP 147/82 | HR 86 | Temp 97.8°F | Resp 18 | Ht 66.0 in | Wt 168.7 lb

## 2016-04-01 DIAGNOSIS — C50411 Malignant neoplasm of upper-outer quadrant of right female breast: Secondary | ICD-10-CM | POA: Diagnosis not present

## 2016-04-01 DIAGNOSIS — G541 Lumbosacral plexus disorders: Secondary | ICD-10-CM

## 2016-04-01 DIAGNOSIS — C50811 Malignant neoplasm of overlapping sites of right female breast: Secondary | ICD-10-CM

## 2016-04-01 DIAGNOSIS — R293 Abnormal posture: Secondary | ICD-10-CM

## 2016-04-01 DIAGNOSIS — N951 Menopausal and female climacteric states: Secondary | ICD-10-CM

## 2016-04-01 DIAGNOSIS — Z170 Estrogen receptor positive status [ER+]: Principal | ICD-10-CM

## 2016-04-01 DIAGNOSIS — F17210 Nicotine dependence, cigarettes, uncomplicated: Secondary | ICD-10-CM | POA: Diagnosis not present

## 2016-04-01 DIAGNOSIS — C773 Secondary and unspecified malignant neoplasm of axilla and upper limb lymph nodes: Secondary | ICD-10-CM | POA: Diagnosis not present

## 2016-04-01 LAB — COMPREHENSIVE METABOLIC PANEL
ALT: 21 U/L (ref 0–55)
ANION GAP: 10 meq/L (ref 3–11)
AST: 20 U/L (ref 5–34)
Albumin: 4 g/dL (ref 3.5–5.0)
Alkaline Phosphatase: 92 U/L (ref 40–150)
BUN: 16 mg/dL (ref 7.0–26.0)
CALCIUM: 9.3 mg/dL (ref 8.4–10.4)
CHLORIDE: 103 meq/L (ref 98–109)
CO2: 27 mEq/L (ref 22–29)
Creatinine: 0.9 mg/dL (ref 0.6–1.1)
EGFR: 75 mL/min/{1.73_m2} — AB (ref >90)
Glucose: 96 mg/dl (ref 70–140)
POTASSIUM: 3.9 meq/L (ref 3.5–5.1)
Sodium: 139 mEq/L (ref 136–145)
Total Bilirubin: 0.64 mg/dL (ref 0.20–1.20)
Total Protein: 6.6 g/dL (ref 6.4–8.3)

## 2016-04-01 LAB — CBC WITH DIFFERENTIAL/PLATELET
BASO%: 1.3 % (ref 0.0–2.0)
BASOS ABS: 0.1 10*3/uL (ref 0.0–0.1)
EOS ABS: 0.2 10*3/uL (ref 0.0–0.5)
EOS%: 3.9 % (ref 0.0–7.0)
HEMATOCRIT: 44.9 % (ref 34.8–46.6)
HGB: 14.7 g/dL (ref 11.6–15.9)
LYMPH#: 2.7 10*3/uL (ref 0.9–3.3)
LYMPH%: 45 % (ref 14.0–49.7)
MCH: 32 pg (ref 25.1–34.0)
MCHC: 32.7 g/dL (ref 31.5–36.0)
MCV: 97.6 fL (ref 79.5–101.0)
MONO#: 0.5 10*3/uL (ref 0.1–0.9)
MONO%: 8.4 % (ref 0.0–14.0)
NEUT#: 2.5 10*3/uL (ref 1.5–6.5)
NEUT%: 41.4 % (ref 38.4–76.8)
PLATELETS: 203 10*3/uL (ref 145–400)
RBC: 4.6 10*6/uL (ref 3.70–5.45)
RDW: 13.8 % (ref 11.2–14.5)
WBC: 6 10*3/uL (ref 3.9–10.3)

## 2016-04-01 MED ORDER — VENLAFAXINE HCL ER 37.5 MG PO CP24: 37.5000 mg | ORAL_CAPSULE | Freq: Every day | ORAL | 2 refills | Status: DC

## 2016-04-01 NOTE — Progress Notes (Signed)
Radiation Oncology         [phone]) [phone] ________________________________  Name: [name] MRN: [id number]  Date: 04/01/2016  DOB: [birth year]  DD:UKGURK,YHCWC, NP  Rolm Bookbinder, MD     REFERRING PHYSICIAN: Rolm Bookbinder, MD   DIAGNOSIS: The encounter diagnosis was Malignant neoplasm of overlapping sites of right breast in female, estrogen receptor positive ([place]).   HISTORY OF PRESENT ILLNESS: [name] who goes by "[name]," is a 56 y.o. female with a new diagnosis of right breast, and has a history of two previous biopsies and cyst aspiration that was negative in 2011, and 2016. She had a mammogram that revealed architectural distortion and diagnostic ultrasound revealed a 3.6 cm area of distortion with extension to a distance of 5 cm, and a 7 mm lesion at 9:30. Her axillary nodes were suspiciously enlarged and a biopsy on 03/23/16 and 03/25/16 of both the mass and nodes. 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 She comes for further recommendations of care.    PREVIOUS RADIATION THERAPY: No   PAST MEDICAL HISTORY:  Past Medical History:  Diagnosis Date  . Anxiety   . Anxiety disorder   . Asthma    h/o asthma as a child  . Cholelithiasis   . Diverticulosis   . DJD (degenerative joint disease)   . Epigastric abdominal pain   . Esophageal stricture   . Family hx of colon cancer   . 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 Ganglion cyst   . GERD (gastroesophageal reflux disease)   . Hemorrhoid   . Hypothyroidism   . PONV (postoperative nausea and vomiting)        PAST SURGICAL HISTORY: Past Surgical History:  Procedure Laterality Date  . ABDOMINAL HYSTERECTOMY    . CESAREAN SECTION    . CHOLECYSTECTOMY    . HEMORRHOID SURGERY    . LAPAROSCOPIC BILATERAL SALPINGECTOMY N/A 10/26/2012   Procedure: operative laparoscopy with lysis of adhesions;  Surgeon: Thornell Sartorius, MD;   Location: [facility];  Service: Gynecology;  Laterality: N/A;  . TUBAL LIGATION    . urologic surgery for ureteropelvic junction obstruction       FAMILY HISTORY:  Family History  Problem Relation Age of Onset  . COPD Mother   . Cancer Mother   . Diabetes Father   . Heart disease Father   . Hypertension Father   . Cancer Father     colon cancer  . Hypertension Sister   . Depression Sister   . Arthritis Sister   . Cancer Paternal Grandmother     ? breast cancer     SOCIAL HISTORY:  reports that she has been smoking Cigarettes.  She has a 7.50 pack-year smoking history. She has never used smokeless tobacco. She reports that she drinks about 8.4 oz of alcohol per week . She reports that she does not use drugs. The patient is married and lives in [place]. She works for [employer].    ALLERGIES: Adhesive [tape] and Promethazine hcl   MEDICATIONS:  Current Outpatient Prescriptions  Medication Sig Dispense Refill  . ALPRAZolam (XANAX) 1 MG tablet Take 1 tablet (1 mg total) by mouth 3 (three) times daily. 90 tablet 0  . celecoxib (CELEBREX) 200 MG capsule Take 1 capsule (200 mg total) by mouth daily. 30 capsule 2  . furosemide (LASIX) 20 MG tablet Take 20 mg by mouth as needed.    Marland Kitchen  HYDROcodone-acetaminophen (NORCO/VICODIN) 5-325 MG per tablet TAKE 1 TABLET BY MOUTH EVERY 6 HOURS AS NEEDED FOR PAIN 120 tablet 3  . levothyroxine (SYNTHROID, LEVOTHROID) 50 MCG tablet TAKE 1 TABLET EVERY DAY 90 tablet 2  . omeprazole (PRILOSEC) 40 MG capsule Take 1 capsule (40 mg total) by mouth daily. 30 capsule 11  . venlafaxine XR (EFFEXOR XR) 37.5 MG 24 hr capsule Take 1 capsule (37.5 mg total) by mouth daily with breakfast. 30 capsule 2   Current Facility-Administered Medications  Medication Dose Route Frequency Provider Last Rate Last Dose  . 0.9 %  sodium chloride infusion  500 mL Intravenous Continuous Irene Shipper, MD         REVIEW OF SYSTEMS: On review of systems, the patient  reports that she is doing well overall. She denies any chest pain, shortness of breath, cough, fevers, chills, night sweats, unintended weight changes. She denies any bowel or bladder disturbances, and denies abdominal pain, nausea or vomiting. She denies any new musculoskeletal or joint aches or pains. A complete review of systems is obtained and is otherwise negative.     PHYSICAL EXAM:  Wt Readings from Last 3 Encounters:  04/01/16 168 lb 11.2 oz (76.5 kg)  08/23/15 161 lb 4 oz (73.1 kg)  12/14/14 146 lb (66.2 kg)   Temp Readings from Last 3 Encounters:  04/01/16 97.8 F (36.6 C) (Oral)  09/27/15 97.8 F (36.6 C) (Temporal)  10/31/13 99.1 F (37.3 C) (Oral)   BP Readings from Last 3 Encounters:  04/01/16 (!) 147/82  11/13/15 (!) 163/94  11/06/15 140/89   Pulse Readings from Last 3 Encounters:  04/01/16 86  11/13/15 85  11/06/15 92     In general this is a well appearing Caucasian  female in no acute distress. She is alert and oriented x4 and appropriate throughout the examination. HEENT reveals that the patient is normocephalic, atraumatic. EOMs are intact. PERRLA. Skin is intact without any evidence of gross lesions. Cardiovascular exam reveals a regular rate and rhythm, no clicks rubs or murmurs are auscultated. Chest is clear to auscultation bilaterally. Lymphatic assessment is performed and does not reveal any adenopathy in the cervical, supraclavicular, axillary, or inguinal chains. Bilateral breast exam reveals ecchymoses in the right breast, with induration beneath the biopsy sites. No masses are noted of the left breast, and no nipple discharge or bleeding of either breast.  Abdomen has active bowel sounds in all quadrants and is intact. The abdomen is soft, non tender, non distended. Lower extremities are negative for pretibial pitting edema, deep calf tenderness, cyanosis or clubbing.    ECOG = 1  0 - Asymptomatic (Fully active, able to carry on all predisease  activities without restriction)  1 - Symptomatic but completely ambulatory (Restricted in physically strenuous activity but ambulatory and able to carry out work of a light or sedentary nature. For example, light housework, office work)  2 - Symptomatic, <50% in bed during the day (Ambulatory and capable of all self care but unable to carry out any work activities. Up and about more than 50% of waking hours)  3 - Symptomatic, >50% in bed, but not bedbound (Capable of only limited self-care, confined to bed or chair 50% or more of waking hours)  4 - Bedbound (Completely disabled. Cannot carry on any self-care. Totally confined to bed or chair)  5 - Death   Eustace Pen MM, Creech RH, Tormey DC, et al. [phone]). "Toxicity and response criteria of the [facility]".  [place] Oncol. 5 (6): 649-55    LABORATORY DATA:  Lab Results  Component Value Date   WBC 6.0 04/01/2016   HGB 14.7 04/01/2016   HCT 44.9 04/01/2016   MCV 97.6 04/01/2016   PLT 203 04/01/2016   Lab Results  Component Value Date   NA 139 04/01/2016   K 3.9 04/01/2016   CL 104 11/06/2015   CO2 27 04/01/2016   Lab Results  Component Value Date   ALT 21 04/01/2016   AST 20 04/01/2016   ALKPHOS 92 04/01/2016   BILITOT 0.64 04/01/2016      RADIOGRAPHY: Mr Breast Bilateral W Wo Contrast  Result Date: 03/30/2016 CLINICAL DATA:  Patient with recent diagnosis of right breast carcinoma with metastatic adenopathy. Multiple biopsy marking clips (6) are demonstrated within the right breast. Additionally a biopsy marking clip is demonstrated within the right axilla. EXAM: BILATERAL BREAST MRI WITH AND WITHOUT CONTRAST TECHNIQUE: Multiplanar, multisequence MR images of both breasts were obtained prior to and following the intravenous administration of 15 ml of MultiHance. THREE-DIMENSIONAL MR IMAGE RENDERING ON INDEPENDENT WORKSTATION: Three-dimensional MR images were rendered by post-processing of the original MR  data on an independent workstation. The three-dimensional MR images were interpreted, and findings are reported in the following complete MRI report for this study. Three dimensional images were evaluated at the independent DynaCad workstation COMPARISON:  Previous exam(s). FINDINGS: Breast composition: b.  Scattered fibroglandular tissue. Background parenchymal enhancement: Moderate. Right breast: There is a large lobular enhancing mass within the upper outer and lower outer right breast with multiple associated bands of enhancement and surrounding nodules. In total this irregular masslike area of enhancement with associated surrounding nodularity measures 6.1 x 4.4 x 5.6 cm. Multiple areas of susceptibility artifact are demonstrated throughout this masslike area of enhancement compatible with biopsy marking clips. No additional concerning areas enhancement identified within the right breast. Left breast: No mass or abnormal enhancement. Lymph nodes: Multiple cortically thickened right axillary lymph nodes are demonstrated measuring up to 1.5 cm (image 29; series 9). Ancillary findings:  None. IMPRESSION: Large irregular masslike area of enhancement within the upper outer and lower outer right breast compatible with biopsy-proven malignancy. Enlarged right axillary lymph nodes compatible with metastatic adenopathy. RECOMMENDATION: Treatment plan for known right breast malignancy. BI-RADS CATEGORY  6: Known biopsy-proven malignancy. Electronically Signed   By: Lovey Newcomer M.D.   On: 03/30/2016 12:02       IMPRESSION/PLAN: 1. 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 Dr. Lisbeth Renshaw discusses the pathology findings and reviews the nature of invasive breast disease. The consensus from the breast conference included proceeding with mammaprint testing to determine a need for the role of neoadjuvant chemotherapy. This will be ordered from her biopsy specimen. If the testing is  showing low risk, then surgery would be offered first. In terms of surgery, due to the size of the distortion, Dr. Donne Hazel recommends mastectomy with either targeted dissection, or full axillary dissection. Following surgery, we would proceed with post mastectomy radiotherapy over 6 1/2 weeks.  Radiation would then be followed by antiestrogen therapy. We discussed the risks, benefits, short, and long term effects of radiotherapy, and the patient is interested in proceeding. Dr. Lisbeth Renshaw discusses the delivery and logistics of radiotherapy. We would anticipate a course of 6 1/2 weeks of therapy. We will see her back about 2 weeks after surgery to move forward with the simulation and planning process and anticipate starting radiotherapy  about 4 weeks after surgery.     The above documentation reflects my direct findings during this shared patient visit. Please see the separate note by Dr. Lisbeth Renshaw on this date for the remainder of the patient's plan of care.    Carola Rhine, PAC

## 2016-04-01 NOTE — Patient Instructions (Signed)

## 2016-04-01 NOTE — Therapy (Signed)
[place], [place], [address] Phone: [phone]   Fax:  [phone]  Physical Therapy Evaluation  Patient Details  Name: [name] MRN: [id number] Date of Birth: 1961-02-18 Referring Provider: Dr. Rolm Bookbinder  Encounter Date: 04/01/2016      PT End of Session - 04/01/16 1537    Visit Number 1   Number of Visits 1   PT Start Time 1054   PT Stop Time 1120   PT Time Calculation (min) 26 min   Activity Tolerance Patient tolerated treatment well   Behavior During Therapy [facility] for tasks assessed/performed      Past Medical History:  Diagnosis Date  . Anxiety   . Anxiety disorder   . Asthma    h/o asthma as a child  . Cholelithiasis   . Diverticulosis   . DJD (degenerative joint disease)   . Epigastric abdominal pain   . Esophageal stricture   . Family hx of colon cancer   . 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 Ganglion cyst   . GERD (gastroesophageal reflux disease)   . Hemorrhoid   . Hypothyroidism   . PONV (postoperative nausea and vomiting)     Past Surgical History:  Procedure Laterality Date  . ABDOMINAL HYSTERECTOMY    . CESAREAN SECTION    . CHOLECYSTECTOMY    . HEMORRHOID SURGERY    . LAPAROSCOPIC BILATERAL SALPINGECTOMY N/A 10/26/2012   Procedure: operative laparoscopy with lysis of adhesions;  Surgeon: Thornell Sartorius, MD;  Location: [facility];  Service: Gynecology;  Laterality: N/A;  . TUBAL LIGATION    . urologic surgery for ureteropelvic junction obstruction      There were no vitals filed for this visit.       Subjective Assessment - 04/01/16 1537    Subjective Patient reports she is here to be seen by her medical team for her newly diagnosed right breast cancer.   Patient is accompained by: Family member   Pertinent History Patient was diagnosed on 03/17/16 with right grade 1-2 invasive ductal carcinoma breast cancer. It measures 3.6 cm with an extension  to 5 cm and is located in overlapping quadrants: upper outer and lower outer right breast.  It is ER/PR positive and HER2 negative with a Ki67 of 40%.  She also had 2 axillary lymph nodes biopsied which were positive.    Currently in Pain? Yes   Pain Score 5    Pain Location Flank   Pain Orientation Right   Pain Descriptors / Indicators Aching;Sharp   Pain Type Chronic pain   Pain Radiating Towards Right lower back   Pain Onset More than a month ago   Pain Frequency Intermittent   Aggravating Factors  Activity; walking - pt reports being unable to exercise due to her right side pain   Pain Relieving Factors unknown   Multiple Pain Sites No            [facility] PT Assessment - 04/01/16 0001      Assessment   Medical Diagnosis Right breast cancer   Referring Provider Dr. Rolm Bookbinder   Onset Date/Surgical Date 03/17/16   Hand Dominance Right   Prior Therapy none     Precautions   Precautions Other (comment)   Precaution Comments Active breast cancer     Restrictions   Weight Bearing Restrictions No     Balance Screen   Has the patient fallen in the past 6 months No   Has  the patient had a decrease in activity level because of a fear of falling?  No   Is the patient reluctant to leave their home because of a fear of falling?  No     Home Social worker Private residence   Living Arrangements Spouse/significant other;Children  Husband and 62 y.o. granddaughter   Available Help at Discharge Family     Prior Function   Level of Independence Independent   Vocation Full time employment  Currently on LOA   [employer] Works for [employer]; on computer most of the time   Leisure She does not exercise     Cognition   Overall Cognitive Status Within Functional Limits for tasks assessed     Posture/Postural Control   Posture/Postural Control Postural limitations   Postural Limitations Forward head;Rounded Shoulders     ROM / Strength    AROM / PROM / Strength AROM;Strength     AROM   AROM Assessment Site Shoulder;Cervical   Right/Left Shoulder Right;Left   Right Shoulder Extension 58 Degrees   Right Shoulder Flexion 167 Degrees   Right Shoulder ABduction 156 Degrees   Right Shoulder Internal Rotation 74 Degrees   Right Shoulder External Rotation 72 Degrees   Left Shoulder Extension 48 Degrees   Left Shoulder Flexion 167 Degrees   Left Shoulder ABduction 166 Degrees   Left Shoulder Internal Rotation 56 Degrees   Left Shoulder External Rotation 67 Degrees   Cervical Flexion WNL   Cervical Extension WNL   Cervical - Right Side Bend 25% limited   Cervical - Left Side Bend WNL   Cervical - Right Rotation WNL   Cervical - Left Rotation WNL     Strength   Overall Strength Within functional limits for tasks performed           LYMPHEDEMA/ONCOLOGY QUESTIONNAIRE - 04/01/16 1535      Type   Cancer Type Right breast cancer     Lymphedema Assessments   Lymphedema Assessments Upper extremities     Right Upper Extremity Lymphedema   10 cm Proximal to Olecranon Process 58 cm   Olecranon Process 167 cm   15 cm Proximal to Ulnar Styloid Process 156 cm   10 cm Proximal to Ulnar Styloid Process 74 cm   Just Proximal to Ulnar Styloid Process 72 cm     Left Upper Extremity Lymphedema   10 cm Proximal to Olecranon Process 48 cm   Olecranon Process 167 cm   10 cm Proximal to Ulnar Styloid Process 166 cm   Just Proximal to Ulnar Styloid Process 56 cm   Across Hand at [employer] 67 cm      Patient was instructed today in a home exercise program today for post op shoulder range of motion. These included active assist shoulder flexion in sitting, scapular retraction, wall walking with shoulder abduction, and hands behind head external rotation.  She was encouraged to do these twice a day, holding 3 seconds and repeating 5 times when permitted by her physician.         PT Education - 04/01/16 1536    Education  provided Yes   Education Details Lymphedema risk reduction and post op shoulder ROM HEP   Person(s) Educated Patient;Spouse   Methods Explanation;Demonstration;Handout   Comprehension Verbalized understanding;Returned demonstration              Breast Clinic Goals - 04/01/16 1559      Patient will be able to verbalize  understanding of pertinent lymphedema risk reduction practices relevant to her diagnosis specifically related to skin care.   Time 1   Period Days   Status Achieved     Patient will be able to return demonstrate and/or verbalize understanding of the post-op home exercise program related to regaining shoulder range of motion.   Time 1   Period Days   Status Achieved     Patient will be able to verbalize understanding of the importance of attending the postoperative After Breast Cancer Class for further lymphedema risk reduction education and therapeutic exercise.   Time 1   Period Days   Status Achieved              Plan - 04/01/16 1537    Clinical Impression Statement Patient was diagnosed on 03/17/16 with right grade 1-2 invasive ductal carcinoma breast cancer. It measures 3.6 cm with an extension to 5 cm and is located in overlapping quadrants: upper outer and lower outer right breast.  It is ER/PR positive and HER2 negative with a Ki67 of 40%.  She also had 2 axillary lymph nodes biopsied which were positive.  Her multidisciplinary medical team met prior to her assessments to determine a recommended treatment plan.  She is planning to have mammaprint testing to determine need for chemotherapy followed by possible neoadjuvant chemo.  If that test is negative, she will undergo a right mastectomy and axillary lymph node dissection. If she undergoes neoadjuvant chemo, she will have either a right lumpectomy or mastectomy with a targeted node dissection. She will also undergo radiation and anti-estrogen therapy. She will benefit from post op PT to regain shoulder  ROM and reduce lymphedema risk as she will be at high risk. She has right flank pain that radiates to her right lower back that is chronic and due to previous surgery for her right kidney obstruction in 2008. She reports this pain limits her ability to exercise which is essential to tolerating chemotherapy and reducing her risk for cancer recurrence.  She will be seen tomorrow by a physical therapist who has special training in treating scar tissue.  At that time, a new plan and goals will be dictated and sent to her referring physician.  Due to her chronic pain comorbidity and her evolving condition, her eval is of moderate complexity.   Rehab Potential Excellent   Clinical Impairments Affecting Rehab Potential None   PT Frequency One time visit   PT Treatment/Interventions Patient/family education;Therapeutic exercise   PT Next Visit Plan Will be assessed 04/02/16 for right flank pain; will f/u with cancer rehab clinic following breast surgery   PT Home Exercise Plan Post op shoulder ROM HEP   Recommended Other Services PT for right flank pain   Consulted and Agree with Plan of Care Patient;Family member/caregiver   Family Member Consulted husband, mother, sister      Patient will benefit from skilled therapeutic intervention in order to improve the following deficits and impairments:  Postural dysfunction, Decreased knowledge of precautions, Pain, Impaired UE functional use, Decreased range of motion  Patient will follow up at outpatient cancer rehab if needed following surgery.  If the patient requires physical therapy at that time, a specific plan will be dictated and sent to the referring physician for approval. The patient was educated today on appropriate basic range of motion exercises to begin post operatively and the importance of attending the After Breast Cancer class following surgery.  Patient was educated today on lymphedema risk reduction practices  as it pertains to recommendations  that will benefit the patient immediately following surgery.  She verbalized good understanding.  No additional physical therapy is indicated at this time.     Visit Diagnosis: Malignant neoplasm of overlapping sites of right breast in female, estrogen receptor positive ([place]) - Plan: PT plan of care cert/re-cert  Abnormal posture - Plan: PT plan of care cert/re-cert  Lumbosacral plexus disorders - Plan: PT plan of care cert/re-cert     Problem List Patient Active Problem List   Diagnosis Date Noted  . Malignant neoplasm of overlapping sites of right breast in female, estrogen receptor positive ([place]) 03/31/2016  . 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 Hydrosalpinx 10/25/2012  . Sciatica of right side 03/31/2012  . Routine general medical examination at a health care facility 08/27/2010  . SPONTANEOUS ECCHYMOSES 01/03/2009  . ANXIETY DISORDER 07/04/2007  . HYPOTHYROIDISM 06/23/2007  . HEMORRHOIDS 06/23/2007  . ESOPHAGEAL STRICTURE 06/23/2007  . GERD 06/23/2007  . DEGENERATIVE JOINT DISEASE 06/23/2007  . ARTHRITIS 06/23/2007  . SLEEP APNEA 06/23/2007    Annia Friendly, PT 04/01/16 4:05 PM  [place], [place], [address] Phone: [phone]   Fax:  [phone]  Name: [name] MRN: [id number] Date of Birth: Mar 13, 1960

## 2016-04-01 NOTE — Progress Notes (Signed)
Nutrition Assessment  Reason for Assessment:  Pt seen in Breast Clinic  ASSESSMENT:   56 year old female with new diagnosis of breast cancer.  Past medical history of GERD, sleep apnea  Patient reports appetite has been decreased since diagnosis of breast cancer due to anxiety and worry.    Medications:  reviewed  Labs: reviewed  Anthropometrics:   Height: 66 inches Weight: 168 pounds BMI: 27.3   NUTRITION DIAGNOSIS: Food and nutrition related knowledge deficit related to new diagnosis of breast cancer as evidenced by no prior need for nutrition related information.  INTERVENTION:   Discussed and provided packet of information regarding nutritional tips for breast cancer patients.  Questions answered.  Teachback method used.    Patient with questions regarding ensure/boost drinks. Discussed that these products were options for added nutrition.    MONITORING, EVALUATION, and GOAL: Pt will consume a healthy plant based diet to maintain lean body mass throughout treatment.   Kenyah Luba B. Zenia Resides, [place], [place] (pager)

## 2016-04-01 NOTE — Telephone Encounter (Signed)
Ordered mammaprint (core) per Dr. Burr Medico.  Faxed requisition to pathology and confirmed receipt.

## 2016-04-01 NOTE — Progress Notes (Signed)
Clinical Social Work [place] Psychosocial Distress Screening [place]  Patient completed distress screening protocol and scored a 10 on the Psychosocial Distress Thermometer which indicates severe distress. Clinical Social Worker met with patient and patients family in [facility] to assess for distress and other psychosocial needs. Patient stated she was feeling overwhelmed but felt "better" after meeting with the treatment team and getting more information on her treatment plan. CSW and patient discussed common feeling and emotions when being diagnosed with cancer, and the importance of support during treatment. CSW informed patient of the support team and support services at [facility]. CSW provided contact information and encouraged patient to call with any questions or concerns.   ONCBCN DISTRESS SCREENING 04/01/2016  Screening Type Initial Screening  Distress experienced in past week (1-10) 10  Practical problem type Insurance  Emotional problem type Nervousness/Anxiety;Adjusting to illness;Adjusting to appearance changes  Spiritual/Religous concerns type Relating to God  Physical Problem type Pain;Nausea/vomiting;Sleep/insomnia;Loss of appetitie;Constipation/diarrhea  Physician notified of physical symptoms Yes    Johnnye Lana, MSW, LCSW, OSW-C Clinical Social Worker [facility] [phone]

## 2016-04-02 ENCOUNTER — Encounter: Payer: Self-pay | Admitting: Physical Therapy

## 2016-04-02 ENCOUNTER — Ambulatory Visit: Payer: BLUE CROSS/BLUE SHIELD | Attending: General Surgery | Admitting: Physical Therapy

## 2016-04-02 ENCOUNTER — Encounter: Payer: Self-pay | Admitting: Hematology

## 2016-04-02 DIAGNOSIS — Z170 Estrogen receptor positive status [ER+]: Secondary | ICD-10-CM | POA: Insufficient documentation

## 2016-04-02 DIAGNOSIS — M62838 Other muscle spasm: Secondary | ICD-10-CM | POA: Diagnosis not present

## 2016-04-02 DIAGNOSIS — R293 Abnormal posture: Secondary | ICD-10-CM | POA: Insufficient documentation

## 2016-04-02 DIAGNOSIS — C50811 Malignant neoplasm of overlapping sites of right female breast: Secondary | ICD-10-CM | POA: Insufficient documentation

## 2016-04-02 LAB — ESTRADIOL

## 2016-04-02 LAB — FOLLICLE STIMULATING HORMONE: FSH: 104.5 m[IU]/mL

## 2016-04-02 NOTE — Patient Instructions (Addendum)
About Abdominal Massage  Abdominal massage, also called external colon massage, is a self-treatment circular massage technique that can reduce and eliminate gas and ease constipation. The colon naturally contracts in waves in a clockwise direction starting from inside the right hip, moving up toward the ribs, across the belly, and down inside the left hip.  When you perform circular abdominal massage, you help stimulate your colon's normal wave pattern of movement called peristalsis.  It is most beneficial when done after eating.  Positioning You can practice abdominal massage with oil while lying down, or in the shower with soap.  Some people find that it is just as effective to do the massage through clothing while sitting or standing.  How to Massage Start by placing your finger tips or knuckles on your right side, just inside your hip bone.  . Make small circular movements while you move upward toward your rib cage.   . Once you reach the bottom right side of your rib cage, take your circular movements across to the left side of the bottom of your rib cage.  . Next, move downward until you reach the inside of your left hip bone.  This is the path your feces travel in your colon. . Continue to perform your abdominal massage in this pattern for 10 minutes each day.     You can apply as much pressure as is comfortable in your massage.  Start gently and build pressure as you continue to practice.  Notice any areas of pain as you massage; areas of slight pain may be relieved as you massage, but if you have areas of significant or intense pain, consult with your healthcare provider.  Other Considerations . General physical activity including bending and stretching can have a beneficial massage-like effect on the colon.  Deep breathing can also stimulate the colon because breathing deeply activates the same nervous system that supplies the colon.   . Abdominal massage should always be used in  combination with a bowel-conscious diet that is high in the proper type of fiber for you, fluids (primarily water), and a regular exercise program. Toileting Techniques for Bowel Movements (Defecation) Using your belly (abdomen) and pelvic floor muscles to have a bowel movement is usually instinctive.  Sometimes people can have problems with these muscles and have to relearn proper defecation (emptying) techniques.  If you have weakness in your muscles, organs that are falling out, decreased sensation in your pelvis, or ignore your urge to go, you may find yourself straining to have a bowel movement.  You are straining if you are: . holding your breath or taking in a huge gulp of air and holding it  . keeping your lips and jaw tensed and closed tightly . turning red in the face because of excessive pushing or forcing . developing or worsening your  hemorrhoids . getting faint while pushing . not emptying completely and have to defecate many times a day  If you are straining, you are actually making it harder for yourself to have a bowel movement.  Many people find they are pulling up with the pelvic floor muscles and closing off instead of opening the anus. Due to lack pelvic floor relaxation and coordination the abdominal muscles, one has to work harder to push the feces out.  Many people have never been taught how to defecate efficiently and effectively.  Notice what happens to your body when you are having a bowel movement.  While you are sitting on the   toilet pay attention to the following areas: . Jaw and mouth position . Angle of your hips   . Whether your feet touch the ground or not . Arm placement  . Spine position . Waist . Belly tension . Anus (opening of the anal canal)  An Evacuation/Defecation Plan   Here are the 4 basic points:  1. Lean forward enough for your elbows to rest on your knees 2. Support your feet on the floor or use a low stool if your feet don't touch the floor    3. Push out your belly as if you have swallowed a beach ball-you should feel a widening of your waist 4. Open and relax your pelvic floor muscles, rather than tightening around the anus      The following conditions my require modifications to your toileting posture:  . If you have had surgery in the past that limits your back, hip, pelvic, knee or ankle flexibility . Constipation   Your healthcare practitioner may make the following additional suggestions and adjustments:  1) Sit on the toilet  a) Make sure your feet are supported. b) Notice your hip angle and spine position-most people find it effective to lean forward or raise their knees, which can help the muscles around the anus to relax  c) When you lean forward, place your forearms on your thighs for support  2) Relax suggestions a) Breath deeply in through your nose and out slowly through your mouth as if you are smelling the flowers and blowing out the candles. b) To become aware of how to relax your muscles, contracting and releasing muscles can be helpful.  Pull your pelvic floor muscles in tightly by using the image of holding back gas, or closing around the anus (visualize making a circle smaller) and lifting the anus up and in.  Then release the muscles and your anus should drop down and feel open. Repeat 5 times ending with the feeling of relaxation. c) Keep your pelvic floor muscles relaxed; let your belly bulge out. d) The digestive tract starts at the mouth and ends at the anal opening, so be sure to relax both ends of the tube.  Place your tongue on the roof of your mouth with your teeth separated.  This helps relax your mouth and will help to relax the anus at the same time.  3) Empty (defecation) a) Keep your pelvic floor and sphincter relaxed, then bulge your anal muscles.  Make the anal opening wide.  b) Stick your belly out as if you have swallowed a beach ball. c) Make your belly wall hard using your belly  muscles while continuing to breathe. Doing this makes it easier to open your anus. d) Breath out and give a grunt (or try using other sounds such as ahhhh, shhhhh, ohhhh or grrrrrrr).  4) Finish a) As you finish your bowel movement, pull the pelvic floor muscles up and in.  This will leave your anus in the proper place rather than remaining pushed out and down. If you leave your anus pushed out and down, it will start to feel as though that is normal and give you incorrect signals about needing to have a bowel movement.    [name], PT [facility] [address] [place], [place] [address] Sitting    Sit comfortably. Allow body's muscles to relax. Place hands on belly. Inhale slowly and deeply for _3__ seconds, so hands move out. Then take _3__ seconds to exhale.  Repeat __5_ times. Do _4__ times a day.  Copyright  VHI. All rights reserved.  [place] [address] [place], [place] [address] Phone # [phone] Fax [phone]

## 2016-04-02 NOTE — Therapy (Addendum)
[facility] [address], [place], [place], [address] Phone: [phone]   Fax:  [phone]  Physical Therapy Treatment  Patient Details  Name: [name] MRN: [id number] Date of Birth: [birth year] Referring Provider: Dr. Rolm Bookbinder  Encounter Date: 04/02/2016      PT End of Session - 04/02/16 1316    Visit Number 2   Date for PT Re-Evaluation 05/28/16   PT Start Time 1230   PT Stop Time 1313   PT Time Calculation (min) 43 min   Activity Tolerance Patient tolerated treatment well   Behavior During Therapy [facility] for tasks assessed/performed      Past Medical History:  Diagnosis Date  . Anxiety   . Anxiety disorder   . Asthma    h/o asthma as a child  . Cholelithiasis   . Diverticulosis   . DJD (degenerative joint disease)   . Epigastric abdominal pain   . Esophageal stricture   . Family hx of colon cancer   . 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 Ganglion cyst   . GERD (gastroesophageal reflux disease)   . Hemorrhoid   . Hypothyroidism   . PONV (postoperative nausea and vomiting)     Past Surgical History:  Procedure Laterality Date  . ABDOMINAL HYSTERECTOMY    . CESAREAN SECTION    . CHOLECYSTECTOMY    . HEMORRHOID SURGERY    . LAPAROSCOPIC BILATERAL SALPINGECTOMY N/A 10/26/2012   Procedure: operative laparoscopy with lysis of adhesions;  Surgeon: Thornell Sartorius, MD;  Location: [facility];  Service: Gynecology;  Laterality: N/A;  . TUBAL LIGATION    . urologic surgery for ureteropelvic junction obstruction      There were no vitals filed for this visit.      Subjective Assessment - 04/02/16 1232    Subjective Patient had surgery to reconstruction of the kidney.  She had a rib removed and was not aware of it.  Patient went to pain management and had injections. Injections feel good for 1-2 days.    Currently in Pain? Yes   Pain Score 5    Pain Location Flank   Pain Orientation  Right   Pain Descriptors / Indicators Aching;Sharp   Pain Type Chronic pain   Pain Radiating Towards right lower back   Pain Onset More than a month ago   Pain Frequency Intermittent   Aggravating Factors  increased pain with exercise; lean to right side; can have trouble getting out of a chair; over exert herself with housecleaning   Pain Relieving Factors unknow            [facility] PT Assessment - 04/02/16 0001      Assessment   Medical Diagnosis right breast cancer; right flank pain   Referring Provider Dr. Rolm Bookbinder   Onset Date/Surgical Date 03/17/16   Hand Dominance Right   Prior Therapy none     Precautions   Precautions Other (comment)   Precaution Comments Active breast cancer     Restrictions   Weight Bearing Restrictions No     Balance Screen   Has the patient fallen in the past 6 months No   Has the patient had a decrease in activity level because of a fear of falling?  No   Is the patient reluctant to leave their home because of a fear of falling?  No     Home Social worker Private residence   Living Arrangements Spouse/significant other;Children  Husband and 50 y.o. granddaughter   Available Help at Discharge Family     Prior Function   Level of Independence Independent   Vocation Full time employment  Currently on LOA   [employer] Works for [employer]; on computer most of the time   Leisure She does not exercise     Cognition   Overall Cognitive Status Within Functional Limits for tasks assessed     Observation/Other Assessments   Focus on Therapeutic Outcomes (FOTO)  63% limitation  46% limitation     Posture/Postural Control   Posture/Postural Control Postural limitations   Postural Limitations Forward head;Rounded Shoulders     AROM   Lumbar Flexion decreased by 25% with deviation to the left   Lumbar Extension decreased by 50%   Lumbar - Right Side Bend decreased by 25%   Lumbar - Left Side Bend  full   Lumbar - Right Rotation decreased by 25%   Lumbar - Left Rotation full           LYMPHEDEMA/ONCOLOGY QUESTIONNAIRE - 04/01/16 1535      Type   Cancer Type Right breast cancer     Lymphedema Assessments   Lymphedema Assessments Upper extremities     Right Upper Extremity Lymphedema   10 cm Proximal to Olecranon Process 58 cm   Olecranon Process 167 cm   15 cm Proximal to Ulnar Styloid Process 156 cm   10 cm Proximal to Ulnar Styloid Process 74 cm   Just Proximal to Ulnar Styloid Process 72 cm     Left Upper Extremity Lymphedema   10 cm Proximal to Olecranon Process 48 cm   Olecranon Process 167 cm   10 cm Proximal to Ulnar Styloid Process 166 cm   Just Proximal to Ulnar Styloid Process 56 cm   Across Hand at [employer] 67 cm                  [facility] Adult PT Treatment/Exercise - 04/02/16 0001      Manual Therapy   Manual Therapy Soft tissue mobilization;Joint mobilization   Joint Mobilization right rib mobilization to open up the rib cage in left sidely   Soft tissue mobilization diaphgram, along the right quadratus, abdominal massage, along the right thoracic rib cage; right lateral abdominal tissue                PT Education - 04/02/16 1315    Education provided Yes   Education Details abdominal massage; toileting technique; diaphgramatic breathing   Person(s) Educated Patient   Methods Explanation;Demonstration;Verbal cues;Handout   Comprehension Returned demonstration;Verbalized understanding          PT Short Term Goals - 04/02/16 1329      PT SHORT TERM GOAL #1   Title independent with initial HEP   Time 4   Period Weeks   Status New     PT SHORT TERM GOAL #2   Title full movement of the ribcage and diaphgram so she is able to exercise   Time 4   Period Weeks   Status New     PT SHORT TERM GOAL #3   Title pain in right flank decreased >/= 25% due to increased tissue mobility   Time 4   Period Weeks   Status New      PT SHORT TERM GOAL #4   Title understand how to perform abdominal massage to reduce constipation with pain medicaiton    Time 4   Period  Weeks   Status New           PT Long Term Goals - 04/02/16 1333      PT LONG TERM GOAL #1   Title independent with HEP    Time 8   Period Weeks   Status New     PT LONG TERM GOAL #2   Title pain with exercise for breast cancer  decreased >/= 50% in the right flank   Time 8   Period Weeks   Status New     PT LONG TERM GOAL #3   Title able to get up and down from a chair with >/= 75% greater ease due to decreased in right flank pain   Time 8   Period Weeks   Status New     PT LONG TERM GOAL #4   Title FOTO score </= 46% limitation   Time 8   Period Weeks   Status New         Breast Clinic Goals - 04/01/16 1559      Patient will be able to verbalize understanding of pertinent lymphedema risk reduction practices relevant to her diagnosis specifically related to skin care.   Time 1   Period Days   Status Achieved     Patient will be able to return demonstrate and/or verbalize understanding of the post-op home exercise program related to regaining shoulder range of motion.   Time 1   Period Days   Status Achieved     Patient will be able to verbalize understanding of the importance of attending the postoperative After Breast Cancer Class for further lymphedema risk reduction education and therapeutic exercise.   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 Since then the pain has made it difficulty to exercise, clean her home, getting out of a chair and over exerting herself. Patient has decreased mobility of right rib cage. Decreased mobility of thoracic spine for flexion deviating to the left, extension, right sidebending and rotation. Tightness is felt  diaphgram left more than right, right thoracic paraspinals, left quadratus, left psoas, lower abdominal area, and along the scar from kidney surgery. Patient has difficulty with diaphgramatic breathing and will have her chest rise.  Patient is moderate complexity due to chronic pain comorbidity and going to have treatment for her breast cancer that may impact her care provided. Patient will benefit from skilled therapy to improve tissue mobiltiy to reduce pain and be able to perform the exercises that are important with chemotherapy and reducein her risk for cancer recurrence.    Rehab Potential Excellent   Clinical Impairments Affecting Rehab Potential None   PT Frequency 1x / week   PT Duration 8 weeks   PT Treatment/Interventions Therapeutic exercise;Therapeutic activities;Manual techniques;Neuromuscular re-education;Patient/family education   PT Next Visit Plan scar massage; rib mobilization; soft tissue work; sitting on ball with trunk motion   PT Home Exercise Plan thoracic stretches; abdominal bracing   Consulted and Agree with Plan of Care Patient      Patient will benefit from skilled therapeutic intervention in order to improve the following deficits and impairments:  Postural dysfunction, Decreased knowledge of precautions, Pain, Impaired UE functional use, Decreased range of motion, Increased muscle spasms, Decreased scar mobility, Decreased  activity tolerance  Visit Diagnosis: Malignant neoplasm of overlapping sites of right breast in female, estrogen receptor positive ([place]) - Plan: PT plan of care cert/re-cert  Other muscle spasm - Plan: PT plan of care cert/re-cert  Abnormal posture - Plan: PT plan of care cert/re-cert     Problem List Patient Active Problem List   Diagnosis Date Noted  . Malignant neoplasm of overlapping sites of right breast in female, estrogen receptor positive ([place]) 03/31/2016  . 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 Hydrosalpinx 10/25/2012   . Sciatica of right side 03/31/2012  . Routine general medical examination at a health care facility 08/27/2010  . SPONTANEOUS ECCHYMOSES 01/03/2009  . ANXIETY DISORDER 07/04/2007  . HYPOTHYROIDISM 06/23/2007  . HEMORRHOIDS 06/23/2007  . ESOPHAGEAL STRICTURE 06/23/2007  . GERD 06/23/2007  . DEGENERATIVE JOINT DISEASE 06/23/2007  . ARTHRITIS 06/23/2007  . SLEEP APNEA 06/23/2007    Earlie Counts, PT 04/02/16 1:39 PM   [facility] [address], [place], [place], [address] Phone: [phone]   Fax:  [phone]  Name: [name] MRN: [id number] Date of Birth: 29-Jun-1960  PHYSICAL THERAPY DISCHARGE SUMMARY  Visits from Start of Care: 1  Current functional level related to goals / functional outcomes: See above.  Patient only attended one session for right flank pain.  Patient is still going to therapy for her lymphedema at [facility] street.    Remaining deficits: See above.    Education / Equipment: HEP  Plan:                                                    Patient goals were not met. Patient is being discharged due to not returning since the last visit.  For right flank pain. Thank you for the referral. Earlie Counts, PT 06/09/16 1:05 PM  ?????

## 2016-04-02 NOTE — Progress Notes (Signed)
Received FMLA/Disability paperwork from patient to be completed

## 2016-04-03 ENCOUNTER — Other Ambulatory Visit: Payer: Self-pay | Admitting: Hematology

## 2016-04-03 MED ORDER — EXEMESTANE 25 MG PO TABS: 25.0000 mg | ORAL_TABLET | Freq: Every day | ORAL | 2 refills | Status: DC

## 2016-04-06 ENCOUNTER — Ambulatory Visit: Payer: BLUE CROSS/BLUE SHIELD | Admitting: Physical Therapy

## 2016-04-06 ENCOUNTER — Telehealth: Payer: Self-pay | Admitting: *Deleted

## 2016-04-06 NOTE — Telephone Encounter (Signed)
  Oncology Nurse Navigator Documentation  Navigator Location: [facility] (04/06/16 1300)   )Navigator Encounter Type: Telephone (04/06/16 1300) Telephone: Outgoing Call;Clinic/MDC Follow-up (04/06/16 1300)                                                  Time Spent with Patient: 30 (04/06/16 1300)

## 2016-04-07 ENCOUNTER — Telehealth: Payer: Self-pay | Admitting: *Deleted

## 2016-04-07 NOTE — Telephone Encounter (Signed)
error 

## 2016-04-08 ENCOUNTER — Telehealth: Payer: Self-pay | Admitting: *Deleted

## 2016-04-08 NOTE — Telephone Encounter (Signed)
Received Mammaprint results of Low Risk.  Placed a copy in Dr. Ernestina Penna box, gave a copy to [place] and took a copy to HIM to scan.

## 2016-04-09 ENCOUNTER — Telehealth: Payer: Self-pay | Admitting: *Deleted

## 2016-04-09 ENCOUNTER — Encounter: Payer: Self-pay | Admitting: *Deleted

## 2016-04-09 NOTE — Telephone Encounter (Signed)
Spoke with patient and informed her of the appointment with Dr. Iran Planas on 04/14/16 at 2pm.

## 2016-04-09 NOTE — Telephone Encounter (Signed)
Spoke with patient and informed her of the mammaprint results.  She would like to discuss with plastic surgeon.  I will call and get her an appointment.  Msg to team.

## 2016-04-13 ENCOUNTER — Encounter (HOSPITAL_COMMUNITY): Payer: Self-pay

## 2016-04-14 DIAGNOSIS — C50811 Malignant neoplasm of overlapping sites of right female breast: Secondary | ICD-10-CM | POA: Diagnosis not present

## 2016-04-14 DIAGNOSIS — Z170 Estrogen receptor positive status [ER+]: Secondary | ICD-10-CM | POA: Diagnosis not present

## 2016-04-21 ENCOUNTER — Other Ambulatory Visit: Payer: Self-pay | Admitting: General Surgery

## 2016-04-21 DIAGNOSIS — C50411 Malignant neoplasm of upper-outer quadrant of right female breast: Secondary | ICD-10-CM

## 2016-04-23 DIAGNOSIS — C801 Malignant (primary) neoplasm, unspecified: Secondary | ICD-10-CM

## 2016-04-23 HISTORY — DX: Malignant (primary) neoplasm, unspecified: C80.1

## 2016-04-29 ENCOUNTER — Telehealth: Payer: Self-pay | Admitting: Hematology

## 2016-04-29 NOTE — Telephone Encounter (Signed)
sw pt to confirm 4/25 appt at 10 am per LOS

## 2016-05-07 DIAGNOSIS — Z170 Estrogen receptor positive status [ER+]: Secondary | ICD-10-CM | POA: Diagnosis not present

## 2016-05-07 DIAGNOSIS — C50811 Malignant neoplasm of overlapping sites of right female breast: Secondary | ICD-10-CM | POA: Diagnosis not present

## 2016-05-07 NOTE — H&P (Signed)
Subjective:     Patient ID: [name] is a 56 y.o. female.  HPI  Here for follow up discussion breast reconstruction. Presented with palpable right breast lump with associated pain. Diagnostic MMG and right US showed a 3.6 cm irregular mass in the right breast 11:00 position, with an enlarged lymph node in the right axilla. Additional 7 mm oval mass in the right breast LOQ present and suspicious for malignancy.  Biopsy of right breast at 9:30 o'clock position showed invasive mammary carcinoma. Biopsy of the right breast at the 11:00 o'clock position showed fibrocystic change and dense fibrosis with no malignancy identified. Right lymph node was positive for metastatic mammary carcinoma. Repeat biopsy of the right breast at the 11:00 o'clock position showed invasive mammary carcinoma. All ER/PR+, Her2-.  MRI howed large irregular masslike area of enhancement within the upper outer and lower outer right breast compatible with biopsy-proven malignancy with enlarged right axillary lymph nodes compatible with metastatic adenopathy.  Mammaprint low risk.  Given area involved, mastectomy recommended. Adjuvant radiation recommended. Patient has elected for bilateral mastectomies reporting she does not want to do this again and symmetry important to her.  Current C or D.  Current smoker- states she has cut back and can go days without cigarette. Accompanied by husband who is non smoker. Patient is representative at [place]. Reports sister in law had breast reconstruction in past, believe from abdomen and she has herself been considering this option.  Review of Systems      Objective:   Physical Exam  Cardiovascular: Normal rate, regular rhythm and normal heart sounds.   Pulmonary/Chest: Effort normal and breath sounds normal.  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 Congratulated her on this.  Reviewed expander at time of mastectomy vs delayed reconstruction. Patient expresses desire to "have something there" after mastectomy. Reviewed she will have expanders in place for several month, that I recommend 6 months from end radiation to implant exchange, that the expanders are quite firm. Reviewed radiation increases risk capsular contracture with implant based reconstruction, as well as doubles risks reconstruction overall. Reviewed reconstruction will be asensate.  Plan skin reduction mastectomies with anchor type scar.  Reviewed drains, OR length, hospital stay and post operative limitations. Discussed process of expansion and implant based risks including rupture, MRI surveillance for silicone implants, infection requiring surgery or removal.  Reviewed use of acellular dermis in breast reconstruction, cadaveric source. If problems with infection or seroma early on, this product can act as nidus for infection as not incorporated for several weeks.  Additional risks including but not limited to bleeding, hematoma, seroma, wound healing problems, extrusion, DVT/PE, cardiopulmonary complications reviewed.  Irene Limbo, MD [facility] [phone], pin [phone]

## 2016-05-12 ENCOUNTER — Encounter (HOSPITAL_BASED_OUTPATIENT_CLINIC_OR_DEPARTMENT_OTHER): Payer: Self-pay | Admitting: *Deleted

## 2016-05-15 NOTE — Progress Notes (Signed)
Boost drink given with instructions to complete by [place], pt verbalized understanding. Hibiclens given per pt's request.

## 2016-05-16 DIAGNOSIS — J069 Acute upper respiratory infection, unspecified: Secondary | ICD-10-CM | POA: Diagnosis not present

## 2016-05-16 DIAGNOSIS — J029 Acute pharyngitis, unspecified: Secondary | ICD-10-CM | POA: Diagnosis not present

## 2016-05-18 DIAGNOSIS — C50919 Malignant neoplasm of unspecified site of unspecified female breast: Secondary | ICD-10-CM | POA: Diagnosis not present

## 2016-05-19 ENCOUNTER — Encounter (HOSPITAL_BASED_OUTPATIENT_CLINIC_OR_DEPARTMENT_OTHER): Payer: Self-pay

## 2016-05-19 ENCOUNTER — Ambulatory Visit (HOSPITAL_BASED_OUTPATIENT_CLINIC_OR_DEPARTMENT_OTHER): Payer: BLUE CROSS/BLUE SHIELD | Admitting: Anesthesiology

## 2016-05-19 ENCOUNTER — Ambulatory Visit (HOSPITAL_BASED_OUTPATIENT_CLINIC_OR_DEPARTMENT_OTHER)
Admission: RE | Admit: 2016-05-19 | Discharge: 2016-05-20 | Disposition: A | Discharge status: Home / Self Care | Payer: BLUE CROSS/BLUE SHIELD | Source: Ambulatory Visit | Attending: General Surgery | Admitting: General Surgery

## 2016-05-19 ENCOUNTER — Encounter (HOSPITAL_BASED_OUTPATIENT_CLINIC_OR_DEPARTMENT_OTHER)
Admission: RE | Disposition: A | Discharge status: Home / Self Care | Payer: Self-pay | Source: Ambulatory Visit | Attending: General Surgery

## 2016-05-19 ENCOUNTER — Ambulatory Visit (HOSPITAL_COMMUNITY)
Admission: RE | Admit: 2016-05-19 | Discharge: 2016-05-19 | Disposition: A | Discharge status: Home / Self Care | Payer: BLUE CROSS/BLUE SHIELD | Source: Ambulatory Visit | Attending: General Surgery | Admitting: General Surgery

## 2016-05-19 DIAGNOSIS — D4862 Neoplasm of uncertain behavior of left breast: Secondary | ICD-10-CM | POA: Diagnosis not present

## 2016-05-19 DIAGNOSIS — F329 Major depressive disorder, single episode, unspecified: Secondary | ICD-10-CM | POA: Diagnosis not present

## 2016-05-19 DIAGNOSIS — E039 Hypothyroidism, unspecified: Secondary | ICD-10-CM | POA: Diagnosis not present

## 2016-05-19 DIAGNOSIS — M1990 Unspecified osteoarthritis, unspecified site: Secondary | ICD-10-CM | POA: Diagnosis not present

## 2016-05-19 DIAGNOSIS — C50411 Malignant neoplasm of upper-outer quadrant of right female breast: Secondary | ICD-10-CM | POA: Diagnosis not present

## 2016-05-19 DIAGNOSIS — G8918 Other acute postprocedural pain: Secondary | ICD-10-CM | POA: Diagnosis not present

## 2016-05-19 DIAGNOSIS — F419 Anxiety disorder, unspecified: Secondary | ICD-10-CM | POA: Diagnosis not present

## 2016-05-19 DIAGNOSIS — K219 Gastro-esophageal reflux disease without esophagitis: Secondary | ICD-10-CM | POA: Insufficient documentation

## 2016-05-19 DIAGNOSIS — C50811 Malignant neoplasm of overlapping sites of right female breast: Secondary | ICD-10-CM | POA: Diagnosis not present

## 2016-05-19 DIAGNOSIS — J45909 Unspecified asthma, uncomplicated: Secondary | ICD-10-CM | POA: Diagnosis not present

## 2016-05-19 DIAGNOSIS — Z170 Estrogen receptor positive status [ER+]: Secondary | ICD-10-CM | POA: Diagnosis not present

## 2016-05-19 DIAGNOSIS — Z79899 Other long term (current) drug therapy: Secondary | ICD-10-CM | POA: Diagnosis not present

## 2016-05-19 DIAGNOSIS — C773 Secondary and unspecified malignant neoplasm of axilla and upper limb lymph nodes: Secondary | ICD-10-CM | POA: Diagnosis not present

## 2016-05-19 DIAGNOSIS — K649 Unspecified hemorrhoids: Secondary | ICD-10-CM | POA: Diagnosis not present

## 2016-05-19 DIAGNOSIS — Z87891 Personal history of nicotine dependence: Secondary | ICD-10-CM | POA: Insufficient documentation

## 2016-05-19 DIAGNOSIS — C50911 Malignant neoplasm of unspecified site of right female breast: Secondary | ICD-10-CM | POA: Diagnosis present

## 2016-05-19 DIAGNOSIS — R921 Mammographic calcification found on diagnostic imaging of breast: Secondary | ICD-10-CM | POA: Diagnosis not present

## 2016-05-19 DIAGNOSIS — C50919 Malignant neoplasm of unspecified site of unspecified female breast: Secondary | ICD-10-CM | POA: Diagnosis not present

## 2016-05-19 DIAGNOSIS — Z853 Personal history of malignant neoplasm of breast: Secondary | ICD-10-CM | POA: Insufficient documentation

## 2016-05-19 DIAGNOSIS — N6012 Diffuse cystic mastopathy of left breast: Secondary | ICD-10-CM | POA: Diagnosis not present

## 2016-05-19 HISTORY — PX: BREAST RECONSTRUCTION WITH PLACEMENT OF TISSUE EXPANDER AND FLEX HD (ACELLULAR HYDRATED DERMIS): SHX6295

## 2016-05-19 HISTORY — PX: MASTECTOMY WITH RADIOACTIVE SEED GUIDED EXCISION AND AXILLARY SENTINEL LYMPH NODE BIOPSY: SHX6736

## 2016-05-19 HISTORY — DX: Major depressive disorder, single episode, unspecified: F32.9

## 2016-05-19 HISTORY — DX: Depression, unspecified: F32.A

## 2016-05-19 HISTORY — DX: Malignant (primary) neoplasm, unspecified: C80.1

## 2016-05-19 SURGERY — MASTECTOMY WITH RADIOACTIVE SEED GUIDED EXCISION AND AXILLARY SENTINEL LYMPH NODE BIOPSY
Anesthesia: General | Site: Breast | Laterality: Bilateral

## 2016-05-19 MED ORDER — ONDANSETRON HCL 4 MG/2ML IJ SOLN
INTRAMUSCULAR | Status: DC | PRN
  Administered 2016-05-19: 4 mg via INTRAVENOUS

## 2016-05-19 MED ORDER — ONDANSETRON HCL 4 MG/2ML IJ SOLN: 4.0000 mg | Freq: Once | INTRAMUSCULAR | Status: DC | PRN

## 2016-05-19 MED ORDER — METHOCARBAMOL 500 MG PO TABS
500.0000 mg | ORAL_TABLET | Freq: Four times a day (QID) | ORAL | Status: DC | PRN
  Administered 2016-05-19 – 2016-05-20 (×3): 500 mg via ORAL
  Filled 2016-05-19 (×3): qty 1

## 2016-05-19 MED ORDER — CEFAZOLIN SODIUM-DEXTROSE 2-4 GM/100ML-% IV SOLN
2.0000 g | INTRAVENOUS | Status: AC
  Administered 2016-05-19: 2 g via INTRAVENOUS

## 2016-05-19 MED ORDER — ROCURONIUM BROMIDE 100 MG/10ML IV SOLN
INTRAVENOUS | Status: DC | PRN
  Administered 2016-05-19 (×2): 20 mg via INTRAVENOUS
  Administered 2016-05-19: 50 mg via INTRAVENOUS

## 2016-05-19 MED ORDER — LACTATED RINGERS IV SOLN
INTRAVENOUS | Status: DC
  Administered 2016-05-19 (×3): via INTRAVENOUS

## 2016-05-19 MED ORDER — HYDROMORPHONE HCL 1 MG/ML IJ SOLN: 0.5000 mg | INTRAMUSCULAR | Status: DC | PRN

## 2016-05-19 MED ORDER — SCOPOLAMINE 1 MG/3DAYS TD PT72
1.0000 | MEDICATED_PATCH | Freq: Once | TRANSDERMAL | Status: DC | PRN
  Administered 2016-05-19: 1.5 mg via TRANSDERMAL

## 2016-05-19 MED ORDER — ACETAMINOPHEN 325 MG PO TABS: 650.0000 mg | ORAL_TABLET | Freq: Four times a day (QID) | ORAL | Status: DC | PRN

## 2016-05-19 MED ORDER — OXYCODONE HCL 5 MG PO TABS: 5.0000 mg | ORAL_TABLET | ORAL | 0 refills | Status: DC | PRN

## 2016-05-19 MED ORDER — GABAPENTIN 300 MG PO CAPS
300.0000 mg | ORAL_CAPSULE | ORAL | Status: AC
  Administered 2016-05-19: 300 mg via ORAL

## 2016-05-19 MED ORDER — SCOPOLAMINE 1 MG/3DAYS TD PT72
MEDICATED_PATCH | TRANSDERMAL | Status: AC
  Filled 2016-05-19: qty 1

## 2016-05-19 MED ORDER — SODIUM CHLORIDE 0.9 % IJ SOLN
INTRAMUSCULAR | Status: AC
  Filled 2016-05-19: qty 10

## 2016-05-19 MED ORDER — HYDROMORPHONE HCL 1 MG/ML IJ SOLN
0.2500 mg | INTRAMUSCULAR | Status: DC | PRN
  Administered 2016-05-19 (×2): 0.25 mg via INTRAVENOUS

## 2016-05-19 MED ORDER — SODIUM CHLORIDE 0.9 % IV SOLN
INTRAVENOUS | Status: DC | PRN
  Administered 2016-05-19: 1000 mL

## 2016-05-19 MED ORDER — ALPRAZOLAM 1 MG PO TABS: 1.0000 mg | ORAL_TABLET | Freq: Three times a day (TID) | ORAL | Status: DC | PRN

## 2016-05-19 MED ORDER — SUGAMMADEX SODIUM 200 MG/2ML IV SOLN
INTRAVENOUS | Status: AC
  Filled 2016-05-19: qty 2

## 2016-05-19 MED ORDER — SIMETHICONE 80 MG PO CHEW: 40.0000 mg | CHEWABLE_TABLET | Freq: Four times a day (QID) | ORAL | Status: DC | PRN

## 2016-05-19 MED ORDER — ONDANSETRON HCL 4 MG/2ML IJ SOLN: 4.0000 mg | Freq: Four times a day (QID) | INTRAMUSCULAR | Status: DC | PRN

## 2016-05-19 MED ORDER — GABAPENTIN 300 MG PO CAPS
ORAL_CAPSULE | ORAL | Status: AC
  Filled 2016-05-19: qty 1

## 2016-05-19 MED ORDER — CHLORHEXIDINE GLUCONATE CLOTH 2 % EX PADS: 6.0000 | MEDICATED_PAD | Freq: Once | CUTANEOUS | Status: DC

## 2016-05-19 MED ORDER — FENTANYL CITRATE (PF) 100 MCG/2ML IJ SOLN
50.0000 ug | INTRAMUSCULAR | Status: DC | PRN
  Administered 2016-05-19 (×2): 50 ug via INTRAVENOUS

## 2016-05-19 MED ORDER — MIDAZOLAM HCL 2 MG/2ML IJ SOLN
INTRAMUSCULAR | Status: AC
  Filled 2016-05-19: qty 2

## 2016-05-19 MED ORDER — SUFENTANIL CITRATE 50 MCG/ML IV SOLN
INTRAVENOUS | Status: AC
  Filled 2016-05-19: qty 1

## 2016-05-19 MED ORDER — CEFAZOLIN IN D5W 1 GM/50ML IV SOLN
1.0000 g | Freq: Three times a day (TID) | INTRAVENOUS | Status: DC
  Administered 2016-05-19 – 2016-05-20 (×2): 1 g via INTRAVENOUS
  Filled 2016-05-19 (×2): qty 50

## 2016-05-19 MED ORDER — LEVOTHYROXINE SODIUM 50 MCG PO TABS: 50.0000 ug | ORAL_TABLET | Freq: Every day | ORAL | Status: DC

## 2016-05-19 MED ORDER — BUPIVACAINE-EPINEPHRINE (PF) 0.25% -1:200000 IJ SOLN
INTRAMUSCULAR | Status: DC | PRN
  Administered 2016-05-19 (×2): 30 mL

## 2016-05-19 MED ORDER — TECHNETIUM TC 99M SULFUR COLLOID FILTERED: 1.0000 | Freq: Once | INTRAVENOUS | Status: DC | PRN

## 2016-05-19 MED ORDER — OXYCODONE HCL 5 MG PO TABS
5.0000 mg | ORAL_TABLET | ORAL | Status: DC | PRN
Start: 2016-05-19 — End: 2016-05-20
  Administered 2016-05-19 – 2016-05-20 (×4): 10 mg via ORAL
  Filled 2016-05-19 (×4): qty 2

## 2016-05-19 MED ORDER — DEXAMETHASONE SODIUM PHOSPHATE 4 MG/ML IJ SOLN
INTRAMUSCULAR | Status: DC | PRN
  Administered 2016-05-19: 10 mg via INTRAVENOUS

## 2016-05-19 MED ORDER — POVIDONE-IODINE 10 % EX SOLN
CUTANEOUS | Status: DC | PRN
  Administered 2016-05-19: 1 via TOPICAL

## 2016-05-19 MED ORDER — ROCURONIUM BROMIDE 50 MG/5ML IV SOSY
PREFILLED_SYRINGE | INTRAVENOUS | Status: AC
  Filled 2016-05-19: qty 5

## 2016-05-19 MED ORDER — ACETAMINOPHEN 500 MG PO TABS
ORAL_TABLET | ORAL | Status: AC
  Filled 2016-05-19: qty 2

## 2016-05-19 MED ORDER — HYDROMORPHONE HCL 1 MG/ML IJ SOLN
INTRAMUSCULAR | Status: AC
  Filled 2016-05-19: qty 1

## 2016-05-19 MED ORDER — KETOROLAC TROMETHAMINE 30 MG/ML IJ SOLN
30.0000 mg | Freq: Three times a day (TID) | INTRAMUSCULAR | Status: DC
  Administered 2016-05-19 – 2016-05-20 (×2): 30 mg via INTRAVENOUS
  Filled 2016-05-19 (×2): qty 1

## 2016-05-19 MED ORDER — ACETAMINOPHEN 650 MG RE SUPP: 650.0000 mg | Freq: Four times a day (QID) | RECTAL | Status: DC | PRN

## 2016-05-19 MED ORDER — ONDANSETRON 4 MG PO TBDP: 4.0000 mg | ORAL_TABLET | Freq: Four times a day (QID) | ORAL | Status: DC | PRN

## 2016-05-19 MED ORDER — ACETAMINOPHEN 500 MG PO TABS
1000.0000 mg | ORAL_TABLET | ORAL | Status: AC
  Administered 2016-05-19: 1000 mg via ORAL

## 2016-05-19 MED ORDER — VENLAFAXINE HCL ER 37.5 MG PO CP24: 37.5000 mg | ORAL_CAPSULE | Freq: Every day | ORAL | Status: DC

## 2016-05-19 MED ORDER — FENTANYL CITRATE (PF) 100 MCG/2ML IJ SOLN
INTRAMUSCULAR | Status: AC
  Filled 2016-05-19: qty 2

## 2016-05-19 MED ORDER — CELECOXIB 200 MG PO CAPS
ORAL_CAPSULE | ORAL | Status: AC
  Filled 2016-05-19: qty 2

## 2016-05-19 MED ORDER — METHYLENE BLUE 0.5 % INJ SOLN
INTRAVENOUS | Status: AC
  Filled 2016-05-19: qty 10

## 2016-05-19 MED ORDER — SUFENTANIL CITRATE 50 MCG/ML IV SOLN
INTRAVENOUS | Status: DC | PRN
  Administered 2016-05-19 (×8): 5 ug via INTRAVENOUS
  Administered 2016-05-19: 10 ug via INTRAVENOUS

## 2016-05-19 MED ORDER — PROPOFOL 10 MG/ML IV BOLUS
INTRAVENOUS | Status: DC | PRN
  Administered 2016-05-19: 200 mg via INTRAVENOUS

## 2016-05-19 MED ORDER — PANTOPRAZOLE SODIUM 40 MG PO TBEC: 40.0000 mg | DELAYED_RELEASE_TABLET | Freq: Every day | ORAL | Status: DC

## 2016-05-19 MED ORDER — CEFAZOLIN SODIUM-DEXTROSE 2-4 GM/100ML-% IV SOLN
INTRAVENOUS | Status: AC
  Filled 2016-05-19: qty 100

## 2016-05-19 MED ORDER — CELECOXIB 200 MG PO CAPS
200.0000 mg | ORAL_CAPSULE | ORAL | Status: AC
  Administered 2016-05-19: 200 mg via ORAL

## 2016-05-19 MED ORDER — METHOCARBAMOL 500 MG PO TABS: 500.0000 mg | ORAL_TABLET | Freq: Three times a day (TID) | ORAL | 0 refills | Status: DC | PRN

## 2016-05-19 MED ORDER — SUGAMMADEX SODIUM 200 MG/2ML IV SOLN
INTRAVENOUS | Status: DC | PRN
  Administered 2016-05-19: 150 mg via INTRAVENOUS

## 2016-05-19 MED ORDER — SULFAMETHOXAZOLE-TRIMETHOPRIM 800-160 MG PO TABS: 1.0000 | ORAL_TABLET | Freq: Two times a day (BID) | ORAL | 0 refills | Status: DC

## 2016-05-19 MED ORDER — MIDAZOLAM HCL 2 MG/2ML IJ SOLN
1.0000 mg | INTRAMUSCULAR | Status: DC | PRN
  Administered 2016-05-19 (×2): 2 mg via INTRAVENOUS

## 2016-05-19 MED ORDER — SODIUM CHLORIDE 0.9 % IV SOLN
INTRAVENOUS | Status: DC
  Administered 2016-05-19: 16:00:00 via INTRAVENOUS

## 2016-05-19 SURGICAL SUPPLY — 109 items
ADH SKN CLS APL DERMABOND .7 (GAUZE/BANDAGES/DRESSINGS) ×4
ALLODERM 8X16 READY TO USE (Tissue) ×2 IMPLANT
ALLODERM RTU 8X16 (Tissue) ×4 IMPLANT
APL SKNCLS STERI-STRIP NONHPOA (GAUZE/BANDAGES/DRESSINGS)
APPLIER CLIP 11 MED OPEN (CLIP) ×3
APR CLP MED 11 20 MLT OPN (CLIP) ×2
BAG DECANTER FOR FLEXI CONT (MISCELLANEOUS) ×6 IMPLANT
BENZOIN TINCTURE PRP APPL 2/3 (GAUZE/BANDAGES/DRESSINGS) IMPLANT
BINDER BREAST LRG (GAUZE/BANDAGES/DRESSINGS) ×6 IMPLANT
BINDER BREAST MEDIUM (GAUZE/BANDAGES/DRESSINGS) IMPLANT
BINDER BREAST XLRG (GAUZE/BANDAGES/DRESSINGS) ×3 IMPLANT
BINDER BREAST XXLRG (GAUZE/BANDAGES/DRESSINGS) IMPLANT
BIOPATCH RED 1 DISK 7.0 (GAUZE/BANDAGES/DRESSINGS) ×3 IMPLANT
BLADE CLIPPER SURG (BLADE) IMPLANT
BLADE HEX COATED 2.75 (ELECTRODE) ×3 IMPLANT
BLADE SURG 10 STRL SS (BLADE) ×12 IMPLANT
BLADE SURG 15 STRL LF DISP TIS (BLADE) ×2 IMPLANT
BLADE SURG 15 STRL SS (BLADE) ×3
BNDG GAUZE ELAST 4 BULKY (GAUZE/BANDAGES/DRESSINGS) IMPLANT
CANISTER SUCT 1200ML W/VALVE (MISCELLANEOUS) ×6 IMPLANT
CHLORAPREP W/TINT 26ML (MISCELLANEOUS) ×6 IMPLANT
CLIP APPLIE 11 MED OPEN (CLIP) ×2 IMPLANT
CLIP TI WIDE RED SMALL 6 (CLIP) IMPLANT
COVER BACK TABLE 60X90IN (DRAPES) ×6 IMPLANT
COVER MAYO STAND STRL (DRAPES) ×6 IMPLANT
COVER PROBE W GEL 5X96 (DRAPES) ×3 IMPLANT
DECANTER SPIKE VIAL GLASS SM (MISCELLANEOUS) IMPLANT
DERMABOND ADVANCED (GAUZE/BANDAGES/DRESSINGS) ×2
DERMABOND ADVANCED .7 DNX12 (GAUZE/BANDAGES/DRESSINGS) ×4 IMPLANT
DEVICE DISSECT PLASMABLAD 3.0S (MISCELLANEOUS) IMPLANT
DRAIN CHANNEL 15F RND FF W/TCR (WOUND CARE) ×5 IMPLANT
DRAIN CHANNEL 19F RND (DRAIN) ×6 IMPLANT
DRAPE LAPAROSCOPIC ABDOMINAL (DRAPES) IMPLANT
DRAPE TOP ARMCOVERS (MISCELLANEOUS) ×3 IMPLANT
DRAPE U-SHAPE 76X120 STRL (DRAPES) ×3 IMPLANT
DRAPE UTILITY XL STRL (DRAPES) ×3 IMPLANT
DRSG PAD ABDOMINAL 8X10 ST (GAUZE/BANDAGES/DRESSINGS) ×6 IMPLANT
DRSG TEGADERM 4X10 (GAUZE/BANDAGES/DRESSINGS) ×9 IMPLANT
DRSG TEGADERM 4X4.75 (GAUZE/BANDAGES/DRESSINGS) IMPLANT
ELECT BLADE 4.0 EZ CLEAN MEGAD (MISCELLANEOUS) ×3
ELECT BLADE 6.5 .24CM SHAFT (ELECTRODE) IMPLANT
ELECT COATED BLADE 2.86 ST (ELECTRODE) ×3 IMPLANT
ELECT REM PT RETURN 9FT ADLT (ELECTROSURGICAL) ×6
ELECTRODE BLDE 4.0 EZ CLN MEGD (MISCELLANEOUS) ×2 IMPLANT
ELECTRODE REM PT RTRN 9FT ADLT (ELECTROSURGICAL) ×4 IMPLANT
EVACUATOR SILICONE 100CC (DRAIN) ×6 IMPLANT
EXPANDER BREAST FX 550CC (Breast) ×3 IMPLANT
EXPANDER TISSUE MX 500CC (Prosthesis & Implant Plastic) ×2 IMPLANT
GAUZE SPONGE 4X4 12PLY STRL (GAUZE/BANDAGES/DRESSINGS) ×3 IMPLANT
GLOVE BIO SURGEON STRL SZ 6 (GLOVE) ×12 IMPLANT
GLOVE BIO SURGEON STRL SZ 6.5 (GLOVE) IMPLANT
GLOVE BIO SURGEON STRL SZ7 (GLOVE) ×6 IMPLANT
GLOVE BIOGEL PI IND STRL 7.0 (GLOVE) ×6 IMPLANT
GLOVE BIOGEL PI IND STRL 7.5 (GLOVE) ×2 IMPLANT
GLOVE BIOGEL PI INDICATOR 7.0 (GLOVE) ×3
GLOVE BIOGEL PI INDICATOR 7.5 (GLOVE) ×1
GOWN STRL REUS W/ TWL LRG LVL3 (GOWN DISPOSABLE) ×8 IMPLANT
GOWN STRL REUS W/TWL LRG LVL3 (GOWN DISPOSABLE) ×12
HEMOSTAT ARISTA ABSORB 3G PWDR (MISCELLANEOUS) IMPLANT
ILLUMINATOR WAVEGUIDE N/F (MISCELLANEOUS) IMPLANT
IV NS 500ML (IV SOLUTION)
IV NS 500ML BAXH (IV SOLUTION) IMPLANT
KIT FILL SYSTEM UNIVERSAL (SET/KITS/TRAYS/PACK) ×3 IMPLANT
KIT MARKER MARGIN INK (KITS) IMPLANT
LIGHT WAVEGUIDE WIDE FLAT (MISCELLANEOUS) IMPLANT
LIQUID BAND (GAUZE/BANDAGES/DRESSINGS) ×9 IMPLANT
NDL SAFETY ECLIPSE 18X1.5 (NEEDLE) IMPLANT
NEEDLE HYPO 18GX1.5 SHARP (NEEDLE)
NEEDLE HYPO 25X1 1.5 SAFETY (NEEDLE) ×3 IMPLANT
NS IRRIG 1000ML POUR BTL (IV SOLUTION) ×3 IMPLANT
PACK BASIN DAY SURGERY FS (CUSTOM PROCEDURE TRAY) ×6 IMPLANT
PACK UNIVERSAL I (CUSTOM PROCEDURE TRAY) IMPLANT
PENCIL BUTTON HOLSTER BLD 10FT (ELECTRODE) ×6 IMPLANT
PIN SAFETY STERILE (MISCELLANEOUS) ×3 IMPLANT
PLASMABLADE 3.0S (MISCELLANEOUS)
SHEET MEDIUM DRAPE 40X70 STRL (DRAPES) ×6 IMPLANT
SLEEVE SCD COMPRESS KNEE MED (MISCELLANEOUS) ×6 IMPLANT
SPONGE LAP 18X18 X RAY DECT (DISPOSABLE) ×18 IMPLANT
STAPLER VISISTAT 35W (STAPLE) ×3 IMPLANT
STRIP CLOSURE SKIN 1/2X4 (GAUZE/BANDAGES/DRESSINGS) IMPLANT
SUT ETHIBOND 2-0 V-5 NEEDLE (SUTURE) IMPLANT
SUT ETHILON 2 0 FS 18 (SUTURE) IMPLANT
SUT ETHILON 3 0 PS 1 (SUTURE) ×6 IMPLANT
SUT MNCRL AB 3-0 PS2 18 (SUTURE) IMPLANT
SUT MNCRL AB 4-0 PS2 18 (SUTURE) ×12 IMPLANT
SUT MON AB 5-0 PS2 18 (SUTURE) IMPLANT
SUT PDS AB 2-0 CT2 27 (SUTURE) IMPLANT
SUT SILK 2 0 SH (SUTURE) IMPLANT
SUT SILK 3 0 PS 1 (SUTURE) IMPLANT
SUT VIC AB 0 CT1 27 (SUTURE) ×12
SUT VIC AB 0 CT1 27XBRD ANBCTR (SUTURE) ×8 IMPLANT
SUT VIC AB 2-0 SH 27 (SUTURE) ×6
SUT VIC AB 2-0 SH 27XBRD (SUTURE) ×4 IMPLANT
SUT VIC AB 3-0 SH 27 (SUTURE) ×39
SUT VIC AB 3-0 SH 27X BRD (SUTURE) ×26 IMPLANT
SUT VICRYL 4-0 PS2 18IN ABS (SUTURE) ×3 IMPLANT
SUT VLOC 180 0 24IN GS25 (SUTURE) IMPLANT
SYR 50ML LL SCALE MARK (SYRINGE) IMPLANT
SYR BULB IRRIGATION 50ML (SYRINGE) ×6 IMPLANT
SYR CONTROL 10ML LL (SYRINGE) ×3 IMPLANT
TAPE MEASURE VINYL STERILE (MISCELLANEOUS) ×3 IMPLANT
TISSUE ALLDRM RTU 8X16 (Tissue) ×4 IMPLANT
TISSUE EXPANDER MX 500CC (Prosthesis & Implant Plastic) ×3 IMPLANT
TOWEL OR 17X24 6PK STRL BLUE (TOWEL DISPOSABLE) ×12 IMPLANT
TRAY FOLEY BAG SILVER LF 14FR (SET/KITS/TRAYS/PACK) ×3 IMPLANT
TRAY FOLEY BAG SILVER LF 16FR (SET/KITS/TRAYS/PACK) IMPLANT
TUBE CONNECTING 20X1/4 (TUBING) ×6 IMPLANT
UNDERPAD 30X30 (UNDERPADS AND DIAPERS) ×9 IMPLANT
YANKAUER SUCT BULB TIP NO VENT (SUCTIONS) ×6 IMPLANT

## 2016-05-19 NOTE — Anesthesia Procedure Notes (Signed)
Anesthesia Regional Block: Pectoralis block   Pre-Anesthetic Checklist: ,, timeout performed, Correct Patient, Correct Site, Correct Laterality, Correct Procedure, Correct Position, site marked, Risks and benefits discussed,  Surgical consent,  Pre-op evaluation,  At surgeon's request and post-op pain management  Laterality: Right  Prep: chloraprep       Needles:  Injection technique: Single-shot  Needle Type: Echogenic Needle     Needle Length: 9cm  Needle Gauge: 21     Additional Needles:   Procedures: ultrasound guided,,,,,,,,  Narrative:  Start time: 05/19/2016 8:55 AM End time: 05/19/2016 9:02 AM Injection made incrementally with aspirations every 5 mL.  Performed by: Personally  Anesthesiologist: Suzette Battiest

## 2016-05-19 NOTE — Interval H&P Note (Signed)
History and Physical Interval Note:  05/19/2016 9:26 AM  [name]  has presented today for surgery, with the diagnosis of RIGHT BREAST CANCER  The various methods of treatment have been discussed with the patient and family. After consideration of risks, benefits and other options for treatment, the patient has consented to  Procedure(s): RIGHT SKIN SPARING MASTECTOMY WITH RIGHT RADIOACTIVE SEED TARGETED DISSECTION AND RIGHT SENTINEL LYMPH NODE BIOPSY, LEFT PROPHYLACTIC SKIN SPARING MASTECTOMY (Bilateral) BREAST RECONSTRUCTION WITH PLACEMENT OF TISSUE EXPANDER AND ALLODERM PLACEMENT (Bilateral) as a surgical intervention .  The patient's history has been reviewed, patient examined, no change in status, stable for surgery.  I have reviewed the patient's chart and labs.  Questions were answered to the patient's satisfaction.     Heron Pitcock

## 2016-05-19 NOTE — Op Note (Signed)
Operative Note   DATE OF OPERATION: 3.27.18  LOCATION: [facility]-observation  SURGICAL DIVISION: Plastic Surgery  PREOPERATIVE DIAGNOSES:  1. Right breast cancer  POSTOPERATIVE DIAGNOSES:  same  PROCEDURE:  1. Bilaterl breast reconstruction with tissue expanders 2. Acellular dermis (Alloderm) for bilateral breast reconstruction 200 cm2  SURGEON: Irene Limbo MD MBA  ASSISTANT: none  ANESTHESIA:  General.   EBL: 200 ml for entire case  COMPLICATIONS: None immediate.   INDICATIONS FOR PROCEDURE:  The patient, [name], is a 56 y.o. female born on 1961-02-11, is here for immediate expander based reconstruction with tissue expanders following skin reduction mastectomies.   FINDINGS: Natrelle 133FX-13-T 550 ml tissue expanders placed bilateral, initial fill volume 270 ml. RIGHT SN [id number] LEFT SN [id number]  DESCRIPTION OF PROCEDURE:  The patient was marked with the patient in the preoperative area to mark sternal notch, chest midline, anterior axillary lines and inframammary folds. Patient was marked for skin reduction mastectomy with most superior portion nipple areola marked on breast meridian. Vertical limbs marked at 9 cm length. The patient was taken to the operating room. SCDs were placed and IV antibiotics were given. Foley catheter placed. The patient's operative site was prepped and draped in a sterile fashion. A time out was performed and all information was confirmed to be correct. In supine position, the lateral limbs for resection marked and area over lower pole preserved as inferiorly based dermal pedicle. Skin de epithelialized in this area.Following completion of mastectomies, reconstruction began on left side.  Lower border pectoralis muscle identified and submuscular dissection completed to accommodate the expander. Acellular dermis prepared and perforated. This was inset to chest wall with running 3-0 vicryl. The cavity was irrigated with Ancef,  gentamicin, bacitracin solution and hemostasis ensured. 62 Fr JP placed in cavity and secured with 2-0 nylon. The cavity was then irrigated with Betadine. Expander prepared and placed in submuscular cavity. Acellular dermis was then wrapped over lower border expander and secured to lateral pectoralis muscle with 3-0 vicryl. The ADM was secured to desired IMF with interrupted 3-0 Vicryl. Laterally the mastectomy flap over posterior axillary line was advanced anteriorly and the subcutaneous tissue and superficial fascia was secured to pectoralis muscle and acellular dermis with 0-vicryl. The inferiorly based dermal pedicle was redraped superiorly over expander and acellular dermis and secured to pectoralis with interrupted 0-vicryl. Skin closure completedwith 3-0 vicryl in fascial layer and 4-0 vicryl in dermis. Skin closure completed with 4-0 monocryl subcuticular.  I then directed attention to right chest. Following elevation of pectoralis muscle, the ADM was perforated and secured to chest wall. Cavity irrigated with antibiotic solution and hemostasis ensured. 19 Fr drain placed and secured with 2-0 nylon. The cavity was irrigated with Betadine and expander placed in submuscular position. The ADM was redraped over expander and secured to pectoralis with 3-0 vicryl. Additional 3-0 vicryl placed from ADM to inframammary fold. Laterally the mastectomy flap over posterior axillary line was advanced anteriorly and the subcutaneous tissue and superficial fascia was secured to pectoralis muscle and acellular dermis with 0-vicryl. The inferiorly based dermal pedicle was redraped superiorly over expander and acellular dermis and secured to pectoralis with interrupted 0-vicryl. Skin closure completedwith 3-0 vicryl in fascial layer and 4-0 vicryl in dermis. Skin closure completed with 4-0 monocryl subcuticular and tissue adhesive applied to all incisions. Tegaderms applied over mastectomy flaps bilateral.  The patient  was allowed to wake from anesthesia, extubated and taken to the recovery room in satisfactory condition.  SPECIMENS: right mastectomy flap skin  DRAINS: 65 Fr JP in right and left chest  Irene Limbo, MD [facility] [phone], pin [phone]

## 2016-05-19 NOTE — Anesthesia Preprocedure Evaluation (Addendum)
Anesthesia Evaluation  Patient identified by MRN, date of birth, ID band Patient awake    Reviewed: Allergy & Precautions, NPO status , Patient's Chart, lab work & pertinent test results  History of Anesthesia Complications (+) PONV  Airway Mallampati: II  TM Distance: >3 FB Neck ROM: Full    Dental  (+) Dental Advisory Given   Pulmonary asthma , former smoker,    breath sounds clear to auscultation       Cardiovascular negative cardio ROS   Rhythm:Regular Rate:Normal     Neuro/Psych Anxiety Depression    GI/Hepatic Neg liver ROS, GERD  ,  Endo/Other  Hypothyroidism   Renal/GU negative Renal ROS     Musculoskeletal  (+) Arthritis ,   Abdominal   Peds  Hematology negative hematology ROS (+)   Anesthesia Other Findings   Reproductive/Obstetrics                            Lab Results  Component Value Date   WBC 6.0 04/01/2016   HGB 14.7 04/01/2016   HCT 44.9 04/01/2016   MCV 97.6 04/01/2016   PLT 203 04/01/2016   Lab Results  Component Value Date   CREATININE 0.9 04/01/2016   BUN 16.0 04/01/2016   NA 139 04/01/2016   K 3.9 04/01/2016   CL 104 11/06/2015   CO2 27 04/01/2016    Anesthesia Physical Anesthesia Plan  ASA: II  Anesthesia Plan: General   Post-op Pain Management:  Regional for Post-op pain   Induction: Intravenous  Airway Management Planned: Oral ETT  Additional Equipment:   Intra-op Plan:   Post-operative Plan: Extubation in OR  Informed Consent: I have reviewed the patients History and Physical, chart, labs and discussed the procedure including the risks, benefits and alternatives for the proposed anesthesia with the patient or authorized representative who has indicated his/her understanding and acceptance.   Dental advisory given  Plan Discussed with:   Anesthesia Plan Comments:         Anesthesia Quick Evaluation

## 2016-05-19 NOTE — Anesthesia Postprocedure Evaluation (Signed)
Anesthesia Post Note  Patient: [name]  Procedure(s) Performed: Procedure(s) (LRB): RIGHT SKIN SPARING MASTECTOMY WITH RIGHT RADIOACTIVE SEED TARGETED DISSECTION AND RIGHT SENTINEL LYMPH NODE BIOPSY, LEFT PROPHYLACTIC SKIN SPARING MASTECTOMY (Bilateral) BREAST RECONSTRUCTION WITH PLACEMENT OF TISSUE EXPANDER AND ALLODERM PLACEMENT (Bilateral)  Patient location during evaluation: PACU Anesthesia Type: General and Regional Level of consciousness: awake and alert Pain management: pain level controlled Vital Signs Assessment: post-procedure vital signs reviewed and stable Respiratory status: spontaneous breathing, nonlabored ventilation, respiratory function stable and patient connected to nasal cannula oxygen Cardiovascular status: blood pressure returned to baseline and stable Postop Assessment: no signs of nausea or vomiting Anesthetic complications: no       Last Vitals:  Vitals:   05/19/16 1515 05/19/16 1530  BP: 128/86 (!) 128/93  Pulse: 93 89  Resp: 13 17  Temp:      Last Pain:  Vitals:   05/19/16 1530  TempSrc:   PainSc: 4                  Montez Hageman

## 2016-05-19 NOTE — Interval H&P Note (Signed)
History and Physical Interval Note:  05/19/2016 9:42 AM  [name]  has presented today for surgery, with the diagnosis of RIGHT BREAST CANCER  The various methods of treatment have been discussed with the patient and family. After consideration of risks, benefits and other options for treatment, the patient has consented to  Procedure(s): RIGHT SKIN SPARING MASTECTOMY WITH RIGHT RADIOACTIVE SEED TARGETED DISSECTION AND RIGHT SENTINEL LYMPH NODE BIOPSY, LEFT PROPHYLACTIC SKIN SPARING MASTECTOMY (Bilateral) BREAST RECONSTRUCTION WITH PLACEMENT OF TISSUE EXPANDER AND ALLODERM PLACEMENT (Bilateral) as a surgical intervention .  The patient's history has been reviewed, patient examined, no change in status, stable for surgery.  I have reviewed the patient's chart and labs.  Questions were answered to the patient's satisfaction.     [name]

## 2016-05-19 NOTE — Op Note (Signed)
Preoperative diagnosis: clinical stage II right breast cancer Postoperative diagnosis: Same as above Procedure: #1 left prophylactic skin sparing mastectomy #2 right skin sparing mastectomy #3 right targeted axillary dissection (seed containing node excision and deep axillary sentinel node biopsy) Surgeon: Dr. Serita Grammes Assistant: Irene Limbo Anesthesia: Gen. with bilateral pectoral block Estimate blood loss: 50 mL Drains: per plastic surgery Complications: None Specimens: #1 left breast tissue marked short stitch superior, long stitch lateral #2 right breast tissue marked short stitch superior, long stitch lateral #3 right axillary sentinel nodes with seed containing node Disposition to recovery in stable condition Sponge and needle count correct at completion  Indications: [name] with clinical stage II right breast cancer including a positive node. She has been given all options and would like to proceed with right mastectomy and TAD and due to young age and high risk nature a left prophylactic mastectomy.   Procedure: After informed consent was obtained the patient was taken to the operating room. She was given cefazolin. SCDs were on. She had undergone bilateral pectoral blocks. She then underwent general anesthesia without complication. Her chest was prepped and draped in the standard sterile surgical fashion. A surgical timeout was then performed.  I first did the left mastectomy. I made an elliptical incision around the nipple areola that was made by Dr Iran Planas. . Flaps were created to the clavicle superiorly, parasternal region medially, inframammary fold inferiorly, and the latissimus laterally. The breast tissue was then rolled up from the pectoralis muscle including the fascia. This was then passed off the table and marked as above. Hemostasis was obtained.This was left open and packed for reconstruction moving forward.  The right mastectomy was done in a  similar fashion. I made an elliptical incision encompassing the nipple areolar complex. Flaps were created. The breast tissue was then removed in a similar fashion. This was marked as above. I made an axillary incision. I removed the seed containing node using the neoprobe this also was the sentinel node. There were several other sentinel nodes present. I removed all these nodes. There was no other radioactivity present. There were no enlarged nodes either. I then obtained hemostasis.I closed this space down as it was connected to mastectomy space.  I then closed the axillary fascia with 2-0 vicryl and the dermis with 3-0 vicryl. The skin was closed with 4-0 monocryl. Case was then turned over to Dr Iran Planas for reconstruction

## 2016-05-19 NOTE — H&P (Signed)
[name] is an 56 y.o. female.   Chief Complaint: breast cancer HPI: 44 yof refrerred by Delia Chimes for new right breast mass. she noted a right breast mass recently. she has history of prior biopsies but no atypia and has no family history . she underwent mm with increased density uoq. US shows a 3.6 cm mass with possible extension to 5 cm with a 7 mm mass that is about 3.5 cm away. there are also two enlarged nodes present. she has no nipple dc. biopsy of both breast masses show similar grade I-II IDC that is er/pr pos, her 2 negative and Ki is 40%. the node is positive. since last visit she has low mammaprint    Past Medical History:  Diagnosis Date  . Anxiety   . Anxiety disorder   . Asthma    h/o asthma as a child  . Cancer ([place]) 04/2016   right breast cancer  . Cholelithiasis   . Depression   . Diverticulosis   . DJD (degenerative joint disease)   . Epigastric abdominal pain   . Esophageal stricture   . Family hx of colon cancer   . 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 Ganglion cyst   . GERD (gastroesophageal reflux disease)   . Hemorrhoid   . Hypothyroidism   . PONV (postoperative nausea and vomiting)     Past Surgical History:  Procedure Laterality Date  . ABDOMINAL HYSTERECTOMY    . CESAREAN SECTION    . CHOLECYSTECTOMY    . HEMORRHOID SURGERY    . LAPAROSCOPIC BILATERAL SALPINGECTOMY N/A 10/26/2012   Procedure: operative laparoscopy with lysis of adhesions;  Surgeon: Thornell Sartorius, MD;  Location: [facility];  Service: Gynecology;  Laterality: N/A;  . TUBAL LIGATION    . urologic surgery for ureteropelvic junction obstruction      Family History  Problem Relation Age of Onset  . COPD Mother   . Cancer Mother   . Diabetes Father   . Heart disease Father   . Hypertension Father   . Cancer Father     colon cancer  . Hypertension Sister   . Depression Sister   . Arthritis Sister   . Cancer Paternal Grandmother     ? breast cancer   Social  History:  reports that she quit smoking about 5 weeks ago. Her smoking use included Cigarettes. She has a 7.50 pack-year smoking history. She has never used smokeless tobacco. She reports that she drinks about 8.4 oz of alcohol per week . She reports that she does not use drugs.  Allergies:  Allergies  Allergen Reactions  . Adhesive [Tape] Dermatitis    Pt prefers paper tape  . Promethazine Hcl Itching and Nausea And Vomiting    Medications Prior to Admission  Medication Sig Dispense Refill  . ALPRAZolam (XANAX) 1 MG tablet Take 1 tablet (1 mg total) by mouth 3 (three) times daily. 90 tablet 0  . celecoxib (CELEBREX) 200 MG capsule Take 1 capsule (200 mg total) by mouth daily. 30 capsule 2  . exemestane (AROMASIN) 25 MG tablet Take 1 tablet (25 mg total) by mouth daily after breakfast. 30 tablet 2  . HYDROcodone-acetaminophen (NORCO/VICODIN) 5-325 MG per tablet TAKE 1 TABLET BY MOUTH EVERY 6 HOURS AS NEEDED FOR PAIN 120 tablet 3  . levothyroxine (SYNTHROID, LEVOTHROID) 50 MCG tablet TAKE 1 TABLET EVERY DAY 90 tablet 2  . omeprazole (PRILOSEC) 40 MG capsule Take 1 capsule (40 mg total) by mouth daily. [place]  capsule 11  . venlafaxine XR (EFFEXOR XR) 37.5 MG 24 hr capsule Take 1 capsule (37.5 mg total) by mouth daily with breakfast. 30 capsule 2  . furosemide (LASIX) 20 MG tablet Take 20 mg by mouth as needed.      No results found for this or any previous visit (from the past 48 hour(s)). No results found.  ROS General Present- Appetite Loss, Fatigue and Night Sweats. Not Present- Chills, Fever, Weight Gain and Weight Loss. Skin Not Present- Change in Wart/Mole, Dryness, Hives, Jaundice, New Lesions, Non-Healing Wounds, Rash and Ulcer. HEENT Present- Seasonal Allergies and Wears glasses/contact lenses. Not Present- Earache, Hearing Loss, Hoarseness, Nose Bleed, Oral Ulcers, Ringing in the Ears, Sinus Pain, Sore Throat, Visual Disturbances and Yellow Eyes. Respiratory Present- Snoring. Not  Present- Bloody sputum, Chronic Cough, Difficulty Breathing and Wheezing. Breast Present- Breast Mass and Breast Pain. Not Present- Nipple Discharge and Skin Changes. Cardiovascular Not Present- Chest Pain, Difficulty Breathing Lying Down, Leg Cramps, Palpitations, Rapid Heart Rate, Shortness of Breath and Swelling of Extremities. Gastrointestinal Present- Nausea. Not Present- Abdominal Pain, Bloating, Bloody Stool, Change in Bowel Habits, Chronic diarrhea, Constipation, Difficulty Swallowing, Excessive gas, Gets full quickly at meals, Hemorrhoids, Indigestion, Rectal Pain and Vomiting. Female Genitourinary Present- Nocturia. Not Present- Frequency, Painful Urination, Pelvic Pain and Urgency. Musculoskeletal Present- Back Pain, Joint Stiffness and Muscle Pain. Not Present- Joint Pain, Muscle Weakness and Swelling of Extremities. Neurological Not Present- Decreased Memory, Fainting, Headaches, Numbness, Seizures, Tingling, Tremor, Trouble walking and Weakness. Psychiatric Present- Change in Sleep Pattern and Frequent crying. Not Present- Anxiety, Bipolar, Depression and Fearful. Endocrine Present- Hot flashes. Not Present- Cold Intolerance, Excessive Hunger, Hair Changes, Heat Intolerance and New Diabetes. Hematology Present- Easy Bruising. Not Present- Blood Thinners, Excessive bleeding, Gland problems, HIV and Persistent Infections. Blood pressure 126/80, pulse 93, temperature 98.2 F (36.8 C), temperature source Oral, resp. rate 16, height 5\' 6"  (1.676 m), weight 75.3 kg (166 lb), SpO2 100 %. Physical Exam  General Mental Status-Alert. Orientation-Oriented X3.  Eye Sclera/Conjunctiva - Bilateral-No scleral icterus.  Chest and Lung Exam Chest and lung exam reveals -on auscultation, normal breath sounds, no adventitious sounds and normal vocal resonance.  Breast Nipples-No Discharge. Note: no left breast mass, large right breast mass about 4 cm with hematoma (some of this may be  hematoma)   Cardiovascular Cardiovascular examination reveals -normal heart sounds, regular rate and rhythm with no murmurs.  Lymphatic Head & Neck  General Head & Neck Lymphatics: Bilateral - Description - Normal. Axillary  General Axillary Region: Bilateral - Description - Normal. Note: no [place] adenopathy Assessment/Plan BREAST CANCER OF UPPER-OUTER QUADRANT OF RIGHT FEMALE BREAST (C50.411) Story: Right ssm with right TAD, Left proph ssm with immediate expander reconstruction I discussed that I think she needs mastectomy due to her cancer on the right side. we discussed a ssm after seeing plastics and due to smoking. we discussed ssm. she understands that a prophylactic mastectomy does not increase survival and there is still chance she could get breast cancer on that side. she very strongly desires this for symmetry. we discussed risks of mastectomy and need for radiotherapy on the right side. we also discussed nodes and tad vs alnd. we discussed risks and benefits of both. after a discussion we are going to proceed with tad (sn and seed guided excision of positive node). will work to schedule with Dr Link Snuffer office  Rolm Bookbinder, MD 05/19/2016, 9:40 AM

## 2016-05-19 NOTE — Progress Notes (Signed)
Assisted Dr. Rodman Comp with bilateral pectoralis blocks. Side rails up, monitors on throughout procedure. See vital signs in flow sheet. Tolerated Procedure well.

## 2016-05-19 NOTE — Transfer of Care (Signed)
Immediate Anesthesia Transfer of Care Note  Patient: [name]  Procedure(s) Performed: Procedure(s): RIGHT SKIN SPARING MASTECTOMY WITH RIGHT RADIOACTIVE SEED TARGETED DISSECTION AND RIGHT SENTINEL LYMPH NODE BIOPSY, LEFT PROPHYLACTIC SKIN SPARING MASTECTOMY (Bilateral) BREAST RECONSTRUCTION WITH PLACEMENT OF TISSUE EXPANDER AND ALLODERM PLACEMENT (Bilateral)  Patient Location: PACU  Anesthesia Type:GA combined with regional for post-op pain  Level of Consciousness: awake, alert  and oriented  Airway & Oxygen Therapy: Patient Spontanous Breathing and Patient connected to face mask oxygen  Post-op Assessment: Report given to RN and Post -op Vital signs reviewed and stable  Post vital signs: Reviewed and stable  Last Vitals:  Vitals:   05/19/16 0930 05/19/16 0935  BP: 126/80   Pulse: 92 93  Resp: 19 16  Temp:      Last Pain:  Vitals:   05/19/16 0815  TempSrc: Oral         Complications: No apparent anesthesia complications

## 2016-05-19 NOTE — Anesthesia Procedure Notes (Addendum)
Procedure Name: Intubation Date/Time: 05/19/2016 10:15 AM Performed by: Melynda Ripple D Pre-anesthesia Checklist: Patient identified, Emergency Drugs available, Suction available and Patient being monitored Patient Re-evaluated:Patient Re-evaluated prior to inductionOxygen Delivery Method: Circle system utilized Preoxygenation: Pre-oxygenation with 100% oxygen Intubation Type: IV induction Ventilation: Mask ventilation without difficulty and Oral airway inserted - appropriate to patient size Laryngoscope Size: Mac and 3 Grade View: Grade II Tube type: Oral Tube size: 7.0 mm Number of attempts: 2 Airway Equipment and Method: Stylet,  Oral airway and Bougie stylet Placement Confirmation: ETT inserted through vocal cords under direct vision,  positive ETCO2 and breath sounds checked- equal and bilateral Secured at: 22 cm Tube secured with: Tape Dental Injury: Teeth and Oropharynx as per pre-operative assessment

## 2016-05-19 NOTE — Anesthesia Procedure Notes (Signed)
Anesthesia Regional Block: Pectoralis block   Pre-Anesthetic Checklist: ,, timeout performed, Correct Patient, Correct Site, Correct Laterality, Correct Procedure, Correct Position, site marked, Risks and benefits discussed,  Surgical consent,  Pre-op evaluation,  At surgeon's request and post-op pain management  Laterality: Left  Prep: chloraprep       Needles:  Injection technique: Single-shot  Needle Type: Echogenic Needle     Needle Length: 9cm  Needle Gauge: 21     Additional Needles:   Procedures: ultrasound guided,,,,,,,,  Narrative:  Start time: 05/19/2016 9:03 AM End time: 05/19/2016 9:10 AM Injection made incrementally with aspirations every 5 mL.  Performed by: Personally  Anesthesiologist: Suzette Battiest

## 2016-05-20 ENCOUNTER — Encounter (HOSPITAL_BASED_OUTPATIENT_CLINIC_OR_DEPARTMENT_OTHER): Payer: Self-pay | Admitting: General Surgery

## 2016-05-20 DIAGNOSIS — F419 Anxiety disorder, unspecified: Secondary | ICD-10-CM | POA: Diagnosis not present

## 2016-05-20 DIAGNOSIS — E039 Hypothyroidism, unspecified: Secondary | ICD-10-CM | POA: Diagnosis not present

## 2016-05-20 DIAGNOSIS — C50411 Malignant neoplasm of upper-outer quadrant of right female breast: Secondary | ICD-10-CM | POA: Diagnosis not present

## 2016-05-20 DIAGNOSIS — Z170 Estrogen receptor positive status [ER+]: Secondary | ICD-10-CM | POA: Diagnosis not present

## 2016-05-20 DIAGNOSIS — M1990 Unspecified osteoarthritis, unspecified site: Secondary | ICD-10-CM | POA: Diagnosis not present

## 2016-05-20 DIAGNOSIS — J45909 Unspecified asthma, uncomplicated: Secondary | ICD-10-CM | POA: Diagnosis not present

## 2016-05-20 DIAGNOSIS — Z87891 Personal history of nicotine dependence: Secondary | ICD-10-CM | POA: Diagnosis not present

## 2016-05-20 DIAGNOSIS — F329 Major depressive disorder, single episode, unspecified: Secondary | ICD-10-CM | POA: Diagnosis not present

## 2016-05-20 DIAGNOSIS — Z853 Personal history of malignant neoplasm of breast: Secondary | ICD-10-CM | POA: Diagnosis not present

## 2016-05-20 DIAGNOSIS — K219 Gastro-esophageal reflux disease without esophagitis: Secondary | ICD-10-CM | POA: Diagnosis not present

## 2016-05-20 DIAGNOSIS — Z79899 Other long term (current) drug therapy: Secondary | ICD-10-CM | POA: Diagnosis not present

## 2016-05-20 NOTE — Progress Notes (Signed)
Patient ID: [name], female   DOB: [birth year], 56 y.o.   MRN: [id number] Doing well, dc home today, will call with path

## 2016-05-20 NOTE — Discharge Instructions (Signed)

## 2016-05-25 ENCOUNTER — Other Ambulatory Visit: Payer: Self-pay | Admitting: Internal Medicine

## 2016-05-26 ENCOUNTER — Other Ambulatory Visit: Payer: Self-pay | Admitting: *Deleted

## 2016-05-26 DIAGNOSIS — Z170 Estrogen receptor positive status [ER+]: Principal | ICD-10-CM

## 2016-05-26 DIAGNOSIS — C50811 Malignant neoplasm of overlapping sites of right female breast: Secondary | ICD-10-CM

## 2016-05-26 MED ORDER — VENLAFAXINE HCL ER 37.5 MG PO CP24: 37.5000 mg | ORAL_CAPSULE | Freq: Every day | ORAL | 3 refills | Status: DC

## 2016-05-26 MED ORDER — EXEMESTANE 25 MG PO TABS: 25.0000 mg | ORAL_TABLET | Freq: Every day | ORAL | 3 refills | Status: DC

## 2016-05-26 NOTE — Telephone Encounter (Signed)
Received call from patient stating she needs 90 day supply for her medication due to her insurance.  Effexor and Aromasin 90 day sent to her pharmacy.

## 2016-05-28 ENCOUNTER — Telehealth: Payer: Self-pay | Admitting: *Deleted

## 2016-05-28 ENCOUNTER — Encounter: Payer: Self-pay | Admitting: Radiation Oncology

## 2016-05-28 NOTE — Telephone Encounter (Signed)
  Oncology Nurse Navigator Documentation  Navigator Location: [facility] (05/28/16 1100)   )Navigator Encounter Type: Telephone (05/28/16 1100) Telephone: Lahoma Crocker Call;Appt Confirmation/Clarification (05/28/16 1100)     Surgery Date: 05/19/16 (05/28/16 1100)    Left vm for pt to return call to discuss next step and to ensure she was continuing on exemstane. Contact information provided.                                        Time Spent with Patient: 15 (05/28/16 1100)

## 2016-06-02 ENCOUNTER — Telehealth: Payer: Self-pay | Admitting: Radiation Oncology

## 2016-06-02 NOTE — Progress Notes (Signed)
Location of Breast Cancer:Right Breast  9:30 position Upper outer Quadrant  Histology per Pathology Report: Diagnosis 1/31/218: Breast, right, needle core biopsy, 11:00 o'clock - INVASIVE MAMMARY CARCINOMA. -   Receptor Status: ER(+), PR (+), Her2-neu (neg ), Ki-67(40% )  Did patient present with symptoms (if so, please note symptoms) or was this found on screening mammography?: patient felt  A lump herself, , pain in breast also, , previous bx's but no atypia,    Past/Anticipated interventions by surgeon, if any: 05/19/16:SURGEON: Irene Limbo MD MBA, follow up 06/12/16 Dr.Wakefield, MD PROCEDURE:  1. Bilaterl breast reconstruction with tissue expanders 2. Acellular dermis (Alloderm) for bilateral breast reconstruction 200 cm2  Biopsy 05/19/16: Dr. Rolm Bookbinder, MD 1. Lymph nodes, regional resection, Right axillary - METASTATIC CARCINOMA IN 2 OF 7 LYMPH NODES (2/7). - SEE COMMENT. 2. Breast, simple mastectomy, Left - LOBULAR NEOPLASIA (ATYPICAL LOBULAR HYPERPLASIA). - FIBROCYSTIC CHANGES WITH ADENOSIS AND CALCIFICATIONS. 3. Breast, simple mastectomy, Right - INVASIVE MIXED LOBULAR-DUCTAL CARCINOMA GRADE I/III, MULTIPLE FOCI, THE LARGEST SPANS 3.0 CM. - LOBULAR NEOPLASIA (ATYPICAL LOBULAR HYPERPLASIA). - ATYPICAL DUCTAL HYPERPLASIA. - LYMPHOVASCULAR INVASION IS IDENTIFIED. - THE SURGICAL RESECTION MARGINS ARE NEGATIVE FOR CARCINOMA. - SEE ONCOLOGY TABLE BELOW. 4. Skin , Right Mastectomy Flap - BENIGN SKIN. Dr. Iran Planas office yesterday  2 jp drains removed,  Follow up 06/10/16, 06/15/16  Past/Anticipated interventions by medical oncology, if any: Chemotherapy : Dr. Cherlynn June 04/01/16: follow up- 06/17/16  Lymphedema issues, if any: no  Pain issues, if any: Chronic bnack pain,  b.l breast pain from expanders 5/6 on 10 scale   SAFETY ISSUES:  Prior radiation? No  Pacemaker/ICD?  No  Possible current pregnancy?No   NOIs the patient on methotrexate?  Complaints/issues:  Married,  Menarche age 56, G75P2, no HRT, Anxiety, Hypothyroidism,DJD,esophageal  Family  Hx   Father, colon cancer,   BP 120/82 (BP Location: Left Arm, Patient Position: Sitting, Cuff Size: Normal)   Pulse 70   Temp 99 F (37.2 C) (Oral)   Resp 20   Ht '5\' 6"'  (1.676 m)   Wt 167 lb (75.8 kg)   BMI 26.95 kg/m   Wt Readings from Last 3 Encounters:  06/04/16 167 lb (75.8 kg)  05/19/16 166 lb (75.3 kg)  04/01/16 168 lb 11.2 oz (76.5 kg)   Allergies: adhesive tape, promethazine hcl Rebecca Eaton, RN 06/02/2016,3:39 PM

## 2016-06-02 NOTE — Telephone Encounter (Signed)
Left message for patient to call back in reference to coming in 30 minutes earlier for appointment

## 2016-06-03 ENCOUNTER — Encounter: Payer: Self-pay | Admitting: Radiation Oncology

## 2016-06-04 ENCOUNTER — Ambulatory Visit
Admission: RE | Admit: 2016-06-04 | Discharge: 2016-06-04 | Disposition: A | Discharge status: Home / Self Care | Payer: BLUE CROSS/BLUE SHIELD | Source: Ambulatory Visit | Attending: Radiation Oncology | Admitting: Radiation Oncology

## 2016-06-04 ENCOUNTER — Encounter: Payer: Self-pay | Admitting: Radiation Oncology

## 2016-06-04 VITALS — BP 120/82 | HR 70 | Temp 99.0°F | Resp 20 | Ht 66.0 in | Wt 167.0 lb

## 2016-06-04 DIAGNOSIS — Z79899 Other long term (current) drug therapy: Secondary | ICD-10-CM | POA: Insufficient documentation

## 2016-06-04 DIAGNOSIS — C50811 Malignant neoplasm of overlapping sites of right female breast: Secondary | ICD-10-CM | POA: Insufficient documentation

## 2016-06-04 DIAGNOSIS — C50011 Malignant neoplasm of nipple and areola, right female breast: Secondary | ICD-10-CM

## 2016-06-04 DIAGNOSIS — Z800 Family history of malignant neoplasm of digestive organs: Secondary | ICD-10-CM | POA: Insufficient documentation

## 2016-06-04 DIAGNOSIS — E039 Hypothyroidism, unspecified: Secondary | ICD-10-CM | POA: Diagnosis not present

## 2016-06-04 DIAGNOSIS — C773 Secondary and unspecified malignant neoplasm of axilla and upper limb lymph nodes: Secondary | ICD-10-CM | POA: Insufficient documentation

## 2016-06-04 DIAGNOSIS — Z87891 Personal history of nicotine dependence: Secondary | ICD-10-CM | POA: Insufficient documentation

## 2016-06-04 DIAGNOSIS — F329 Major depressive disorder, single episode, unspecified: Secondary | ICD-10-CM | POA: Diagnosis not present

## 2016-06-04 DIAGNOSIS — F419 Anxiety disorder, unspecified: Secondary | ICD-10-CM | POA: Insufficient documentation

## 2016-06-04 DIAGNOSIS — Z825 Family history of asthma and other chronic lower respiratory diseases: Secondary | ICD-10-CM | POA: Insufficient documentation

## 2016-06-04 DIAGNOSIS — Z833 Family history of diabetes mellitus: Secondary | ICD-10-CM | POA: Diagnosis not present

## 2016-06-04 DIAGNOSIS — Z888 Allergy status to other drugs, medicaments and biological substances status: Secondary | ICD-10-CM | POA: Diagnosis not present

## 2016-06-04 DIAGNOSIS — Z510 Encounter for antineoplastic radiation therapy: Secondary | ICD-10-CM | POA: Diagnosis not present

## 2016-06-04 DIAGNOSIS — Z9013 Acquired absence of bilateral breasts and nipples: Secondary | ICD-10-CM | POA: Insufficient documentation

## 2016-06-04 DIAGNOSIS — K219 Gastro-esophageal reflux disease without esophagitis: Secondary | ICD-10-CM | POA: Diagnosis not present

## 2016-06-04 DIAGNOSIS — Z170 Estrogen receptor positive status [ER+]: Secondary | ICD-10-CM | POA: Diagnosis not present

## 2016-06-04 DIAGNOSIS — Z8249 Family history of ischemic heart disease and other diseases of the circulatory system: Secondary | ICD-10-CM | POA: Diagnosis not present

## 2016-06-04 DIAGNOSIS — C50411 Malignant neoplasm of upper-outer quadrant of right female breast: Secondary | ICD-10-CM

## 2016-06-04 NOTE — Progress Notes (Signed)
Please see the Nurse Progress Note in the MD Initial Consult Encounter for this patient. 

## 2016-06-04 NOTE — Progress Notes (Signed)
Radiation Oncology         [phone] ________________________________  Name: [name] MRN: [id number]  Date: 06/04/2016  DOB: 12/01/1960  VH:QIONG Toy Cookey, NP (Inactive)  Truitt Merle, MD     REFERRING PHYSICIAN: Truitt Merle, MD   DIAGNOSIS: The primary encounter diagnosis was Malignant neoplasm of nipple of right breast in female, unspecified estrogen receptor status ([place]). A diagnosis of Malignant neoplasm of upper-outer quadrant of right breast in female, estrogen receptor positive ([place]) was also pertinent to this visit.   HISTORY OF PRESENT ILLNESS: [name] who goes by "[name]," is a 56 y.o. female who presented to multidisciplinary clinic on 04/01/16 with a new diagnosis of right breast, and has a history of two previous biopsies and cyst aspiration that was negative in 2011, and 2016. She had a mammogram that revealed architectural distortion and diagnostic ultrasound revealed a 3.6 cm area of distortion with extension to a distance of 5 cm, and a 7 mm lesion at 9:30. Her axillary nodes were suspiciously enlarged and a biopsy on 03/23/16 and 03/25/16 of both the mass and nodes. 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   Since clinic, the patient underwent a bilateral mastectomies with tissue expander placement and right sentinel node assessment with Dr. Donne Hazel and Dr. Iran Planas on 05/19/16. Right mastectomy showed invasive mixed lobular-ductal carcinoma, grade I/III, largest spanning 3.0 cm. There was lymphovascular invasion identified, and negative margins. Sentinel lymph node biopsy showed metastatic carcinoma in 2 of 7 lymph nodes (2/7) the left breast at [facility] without malignancy. She presents today to discuss the role of radiation as part of her disease management. Her mammaprint score on her original biopsy was low risk and this has been discussed with Dr. Burr Medico who will meet with the patient in the  near future. She is not planning on chemotherapy.   PREVIOUS RADIATION THERAPY: No   PAST MEDICAL HISTORY:  Past Medical History:  Diagnosis Date  . Anxiety   . Anxiety disorder   . Asthma    h/o asthma as a child  . Cancer ([place]) 04/2016   right breast cancer  . Cholelithiasis   . Depression   . Diverticulosis   . DJD (degenerative joint disease)   . Epigastric abdominal pain   . Esophageal stricture   . Family hx of colon cancer   . 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 Ganglion cyst   . GERD (gastroesophageal reflux disease)   . Hemorrhoid   . Hypothyroidism   . PONV (postoperative nausea and vomiting)        PAST SURGICAL HISTORY: Past Surgical History:  Procedure Laterality Date  . ABDOMINAL HYSTERECTOMY    . BREAST RECONSTRUCTION WITH PLACEMENT OF TISSUE EXPANDER AND FLEX HD (ACELLULAR HYDRATED DERMIS) Bilateral 05/19/2016   Procedure: BREAST RECONSTRUCTION WITH PLACEMENT OF TISSUE EXPANDER AND ALLODERM PLACEMENT;  Surgeon: Irene Limbo, MD;  Location: [facility];  Service: Plastics;  Laterality: Bilateral;  . CESAREAN SECTION    . CHOLECYSTECTOMY    . HEMORRHOID SURGERY    . LAPAROSCOPIC BILATERAL SALPINGECTOMY N/A 10/26/2012   Procedure: operative laparoscopy with lysis of adhesions;  Surgeon: Thornell Sartorius, MD;  Location: [facility];  Service: Gynecology;  Laterality: N/A;  . MASTECTOMY WITH RADIOACTIVE SEED GUIDED EXCISION AND AXILLARY SENTINEL LYMPH NODE BIOPSY Bilateral 05/19/2016   Procedure: RIGHT SKIN SPARING MASTECTOMY WITH RIGHT RADIOACTIVE SEED TARGETED DISSECTION AND RIGHT SENTINEL LYMPH  NODE BIOPSY, LEFT PROPHYLACTIC SKIN SPARING MASTECTOMY;  Surgeon: Rolm Bookbinder, MD;  Location: [facility];  Service: General;  Laterality: Bilateral;  . TUBAL LIGATION    . urologic surgery for ureteropelvic junction obstruction       FAMILY HISTORY:  Family History  Problem Relation Age of Onset  . COPD Mother   . Cancer  Mother   . Diabetes Father   . Heart disease Father   . Hypertension Father   . Cancer Father     colon cancer  . Hypertension Sister   . Depression Sister   . Arthritis Sister   . Cancer Paternal Grandmother     ? breast cancer     SOCIAL HISTORY:  reports that she quit smoking about 8 weeks ago. Her smoking use included Cigarettes. She has a 7.50 pack-year smoking history. She has never used smokeless tobacco. She reports that she drinks about 8.4 oz of alcohol per week . She reports that she does not use drugs. The patient is married and lives in [place]. She works for [employer].    ALLERGIES: Adhesive [tape] and Promethazine hcl   MEDICATIONS:  Current Outpatient Prescriptions  Medication Sig Dispense Refill  . ALPRAZolam (XANAX) 1 MG tablet Take 1 tablet (1 mg total) by mouth 3 (three) times daily. 90 tablet 0  . celecoxib (CELEBREX) 200 MG capsule Take 1 capsule (200 mg total) by mouth daily. 30 capsule 2  . exemestane (AROMASIN) 25 MG tablet Take 1 tablet (25 mg total) by mouth daily after breakfast. 90 tablet 3  . levothyroxine (SYNTHROID, LEVOTHROID) 50 MCG tablet TAKE 1 TABLET EVERY DAY 90 tablet 2  . methocarbamol (ROBAXIN) 500 MG tablet Take 1 tablet (500 mg total) by mouth every 8 (eight) hours as needed for muscle spasms. 30 tablet 0  . omeprazole (PRILOSEC) 40 MG capsule TAKE ONE CAPSULE BY MOUTH EVERY DAY 30 capsule 4  . oxyCODONE (ROXICODONE) 5 MG immediate release tablet Take 1-2 tablets (5-10 mg total) by mouth every 4 (four) hours as needed for severe pain. 45 tablet 0  . polyethylene glycol (MIRALAX / GLYCOLAX) packet Take 17 g by mouth every other day.    . senna (SENOKOT) 8.6 MG tablet Take 1 tablet by mouth daily.    Marland Kitchen venlafaxine XR (EFFEXOR XR) 37.5 MG 24 hr capsule Take 1 capsule (37.5 mg total) by mouth daily with breakfast. 90 capsule 3  . sulfamethoxazole-trimethoprim (BACTRIM DS,SEPTRA DS) 800-160 MG tablet Take 1 tablet by mouth 2 (two) times  daily. (Patient not taking: Reported on 06/04/2016) 12 tablet 0   No current facility-administered medications for this encounter.      REVIEW OF SYSTEMS: On review of systems, the patient reports that she is doing well overall. She denies any chest pain, shortness of breath, cough, fevers, chills, night sweats, unintended weight changes. She denies any bowel or bladder disturbances, and denies abdominal pain, nausea or vomiting. She reports chronic back pain and 5/10 breast pain related to her expanders.over the weekend she was working outside weeding and reports that she did have some increased fullness and pain in the RUE. This has gone down since, but she is interested in further evaluation. She denies any other new musculoskeletal or joint aches or pains. A complete review of systems is obtained and is otherwise negative.     PHYSICAL EXAM:  Wt Readings from Last 3 Encounters:  06/04/16 167 lb (75.8 kg)  05/19/16 166 lb (75.3 kg)  04/01/16 168 lb 11.2 oz (76.5 kg)   Temp Readings from Last 3 Encounters:  06/04/16 99 F (37.2 C) (Oral)  05/20/16 98.5 F (36.9 C)  04/01/16 97.8 F (36.6 C) (Oral)   BP Readings from Last 3 Encounters:  06/04/16 120/82  05/20/16 130/80  04/01/16 (!) 147/82   Pulse Readings from Last 3 Encounters:  06/04/16 70  05/20/16 79  04/01/16 86    In general this is a well appearing caucasian woman in no acute distress. She's alert and oriented x4 and appropriate throughout the examination. Cardiopulmonary assessment is negative for acute distress and she exhibits normal effort. Patient is recovering from nipple sparing bilateral mastectomy. Tissue expanders are in place bilaterally. No chest wall or upper extremity edema is noted in the right.   ECOG = 0  0 - Asymptomatic (Fully active, able to carry on all predisease activities without restriction)  1 - Symptomatic but completely ambulatory (Restricted in physically strenuous activity but ambulatory  and able to carry out work of a light or sedentary nature. For example, light housework, office work)  2 - Symptomatic, <50% in bed during the day (Ambulatory and capable of all self care but unable to carry out any work activities. Up and about more than 50% of waking hours)  3 - Symptomatic, >50% in bed, but not bedbound (Capable of only limited self-care, confined to bed or chair 50% or more of waking hours)  4 - Bedbound (Completely disabled. Cannot carry on any self-care. Totally confined to bed or chair)  5 - Death   Eustace Pen MM, Creech RH, Tormey DC, et al. [phone]). "Toxicity and response criteria of the [facility]". [place] Oncol. 5 (6): 649-55    LABORATORY DATA:  Lab Results  Component Value Date   WBC 6.0 04/01/2016   HGB 14.7 04/01/2016   HCT 44.9 04/01/2016   MCV 97.6 04/01/2016   PLT 203 04/01/2016   Lab Results  Component Value Date   NA 139 04/01/2016   K 3.9 04/01/2016   CL 104 11/06/2015   CO2 27 04/01/2016   Lab Results  Component Value Date   ALT 21 04/01/2016   AST 20 04/01/2016   ALKPHOS 92 04/01/2016   BILITOT 0.64 04/01/2016      RADIOGRAPHY: Nm Sentinel Node Inj-no Rpt (breast)  Result Date: 06/02/2016 Sulfur colloid was injected intradermally by the nuclear medicine technologist for breast cancer sentinel node localization      IMPRESSION/PLAN: 1. 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 Dr. Lisbeth Renshaw discusses the pathology findings and reviews the nature of invasive breast disease. The patient will follow-up with Dr.Wakefield on 06/12/16. Following his approval, we will proceed with post mastectomy radiotherapy over 6 1/2 weeks.  Radiation would then be followed by antiestrogen therapy. We discussed the risks, benefits, short, and long term effects of radiotherapy, and the patient is interested in proceeding. Dr. Lisbeth Renshaw discusses the delivery and logistics of radiotherapy. We  would anticipate a course of 6 1/2 weeks of therapy to the chest wall and regional nodes. The patient will plan to wait to exchange her expanders for implants later this year. 2. The patient notes a decrease in the range of motion in the right arm and we will refer her to physical therapy. She's also interested in being evaluated for possible lymphedema.   In a visit lasting 30 minutes, greater than 50% of the time was spent face to face  discussing her surgical recovery, and coordinating the patient's care.   The above documentation reflects my direct findings during this shared patient visit. Please see the separate note by Dr. Lisbeth Renshaw on this date for the remainder of the patient's plan of care.    Carola Rhine, PAC  This document serves as a record of services personally performed by Kyung Rudd, MD and Shona Simpson, PA-C. It was created on their behalf by Bethann Humble, a trained medical scribe. The creation of this record is based on the scribe's personal observations and the provider's statements to them. This document has been checked and approved by the attending provider.

## 2016-06-05 ENCOUNTER — Other Ambulatory Visit: Payer: Self-pay | Admitting: Radiation Oncology

## 2016-06-05 DIAGNOSIS — Z170 Estrogen receptor positive status [ER+]: Principal | ICD-10-CM

## 2016-06-05 DIAGNOSIS — C50811 Malignant neoplasm of overlapping sites of right female breast: Secondary | ICD-10-CM

## 2016-06-08 ENCOUNTER — Ambulatory Visit: Payer: BLUE CROSS/BLUE SHIELD | Attending: Radiation Oncology | Admitting: Physical Therapy

## 2016-06-08 ENCOUNTER — Encounter: Payer: Self-pay | Admitting: Physical Therapy

## 2016-06-08 DIAGNOSIS — M25612 Stiffness of left shoulder, not elsewhere classified: Secondary | ICD-10-CM | POA: Insufficient documentation

## 2016-06-08 DIAGNOSIS — R293 Abnormal posture: Secondary | ICD-10-CM | POA: Insufficient documentation

## 2016-06-08 DIAGNOSIS — R600 Localized edema: Secondary | ICD-10-CM | POA: Insufficient documentation

## 2016-06-08 DIAGNOSIS — M25512 Pain in left shoulder: Secondary | ICD-10-CM | POA: Diagnosis not present

## 2016-06-08 DIAGNOSIS — M6281 Muscle weakness (generalized): Secondary | ICD-10-CM | POA: Diagnosis not present

## 2016-06-08 DIAGNOSIS — M25511 Pain in right shoulder: Secondary | ICD-10-CM | POA: Diagnosis not present

## 2016-06-08 DIAGNOSIS — M25611 Stiffness of right shoulder, not elsewhere classified: Secondary | ICD-10-CM | POA: Diagnosis not present

## 2016-06-08 NOTE — Therapy (Signed)
[place], [place], [address] Phone: [phone]   Fax:  [phone]  Physical Therapy Evaluation  Patient Details  Name: [name] MRN: [id number] Date of Birth: 1960-10-04 Referring Provider: Hayden Pedro  Encounter Date: 06/08/2016      PT End of Session - 06/08/16 1452    Visit Number 1   Number of Visits 9   Date for PT Re-Evaluation 07/06/16   PT Start Time 1351   PT Stop Time 1440   PT Time Calculation (min) 49 min   Activity Tolerance Patient tolerated treatment well   Behavior During Therapy [facility] for tasks assessed/performed      Past Medical History:  Diagnosis Date  . Anxiety   . Anxiety disorder   . Asthma    h/o asthma as a child  . Cancer ([place]) 04/2016   right breast cancer  . Cholelithiasis   . Depression   . Diverticulosis   . DJD (degenerative joint disease)   . Epigastric abdominal pain   . Esophageal stricture   . Family hx of colon cancer   . 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 Ganglion cyst   . GERD (gastroesophageal reflux disease)   . Hemorrhoid   . Hypothyroidism   . PONV (postoperative nausea and vomiting)     Past Surgical History:  Procedure Laterality Date  . ABDOMINAL HYSTERECTOMY    . BREAST RECONSTRUCTION WITH PLACEMENT OF TISSUE EXPANDER AND FLEX HD (ACELLULAR HYDRATED DERMIS) Bilateral 05/19/2016   Procedure: BREAST RECONSTRUCTION WITH PLACEMENT OF TISSUE EXPANDER AND ALLODERM PLACEMENT;  Surgeon: Irene Limbo, MD;  Location: [facility];  Service: Plastics;  Laterality: Bilateral;  . CESAREAN SECTION    . CHOLECYSTECTOMY    . HEMORRHOID SURGERY    . LAPAROSCOPIC BILATERAL SALPINGECTOMY N/A 10/26/2012   Procedure: operative laparoscopy with lysis of adhesions;  Surgeon: Thornell Sartorius, MD;  Location: [facility];  Service: Gynecology;  Laterality: N/A;  . MASTECTOMY WITH RADIOACTIVE SEED GUIDED EXCISION AND AXILLARY  SENTINEL LYMPH NODE BIOPSY Bilateral 05/19/2016   Procedure: RIGHT SKIN SPARING MASTECTOMY WITH RIGHT RADIOACTIVE SEED TARGETED DISSECTION AND RIGHT SENTINEL LYMPH NODE BIOPSY, LEFT PROPHYLACTIC SKIN SPARING MASTECTOMY;  Surgeon: Rolm Bookbinder, MD;  Location: [facility];  Service: General;  Laterality: Bilateral;  . TUBAL LIGATION    . urologic surgery for ureteropelvic junction obstruction      There were no vitals filed for this visit.       Subjective Assessment - 06/08/16 1401    Subjective On May 19, 2016 pt underwent bilateral mastectomy with sentinel lymph node biopsy on R ( removed 7 - 2 were cancerous). Pt states she will require radiation. Since surgery pt reports tightness when trying to lift arms. They removed the drains last week and I got filled with saline. I have expanders now. I have noticed when I take the compression bra off I have indentations in my breasts. Pt also feels like she has swelling under her arms and along her trunk. 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 It measures 3.6 cm with an extension to 5 cm and is located in overlapping quadrants: upper outer and lower outer right breast.  It is ER/PR positive and HER2 negative with a Ki67 of 40%.  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 Yes   Pain Score 6    Pain Location Chest   Pain Orientation Right;Left   Pain Descriptors / Indicators Heaviness;Aching  fullness    Pain Type Surgical pain   Pain Onset 1 to 4 weeks ago   Pain Frequency Constant   Aggravating Factors  being more active   Pain Relieving Factors pain pills and muscle relaxers   Effect of Pain on Daily Activities limits ability to complete ADLs            [facility] PT Assessment - 06/08/16 0001      Assessment   Medical Diagnosis Right breast cancer   Referring Provider Hayden Pedro   Onset Date/Surgical Date 05/19/16   Hand Dominance Right   Prior Therapy none     Precautions   Precautions Other (comment)   Precaution Comments at risk for lymphedema     Restrictions   Weight Bearing Restrictions No     Home Environment   Living Environment Private residence   Living Arrangements Spouse/significant other;Children  Husband and 63 y.o. granddaughter   Available Help at Discharge Family     Prior Function   Level of Independence Independent   Vocation Full time employment  Currently on [employer] Works for [employer]; on computer most of the time   Leisure She does not exercise     Cognition   Overall Cognitive Status Within Functional Limits for tasks assessed     Observation/Other Assessments   Skin Integrity healing bilateral mastectomy scars, R lateral trunk swelling worse than left with visible indentations noted   Other Surveys  --  LLIS: 68%     Observation/Other Assessments-Edema    Edema --  pt has swelling in bilateral lateral trunk, R worse than L     Posture/Postural Control   Posture/Postural Control Postural limitations   Postural Limitations Forward head;Rounded Shoulders     AROM   Right Shoulder Extension --   Right Shoulder Flexion 139 Degrees   Right Shoulder ABduction 105 Degrees   Right Shoulder Internal Rotation 72 Degrees   Right Shoulder External Rotation 64 Degrees   Left Shoulder Extension --   Left Shoulder Flexion 164 Degrees   Left Shoulder ABduction 116 Degrees   Left Shoulder Internal Rotation 67  Degrees   Left Shoulder External Rotation 72 Degrees   Cervical Flexion --   Cervical Extension --   Cervical - Right Side Bend --   Cervical - Left Side Bend --   Cervical - Right Rotation --   Cervical - Left Rotation --     Strength   Overall Strength Unable to assess;Due to pain           LYMPHEDEMA/ONCOLOGY QUESTIONNAIRE - 06/08/16 1425      Type   Cancer Type right breast cancer     Surgeries   Mastectomy Date 05/19/16   Sentinel Lymph Node Biopsy Date 05/19/16   Number Lymph Nodes Removed 7     Treatment   Active Chemotherapy Treatment No   Past  Chemotherapy Treatment No   Active Radiation Treatment No   Past Radiation Treatment No  will begin in the next month   Current Hormone Treatment Yes   Drug Name Aromasin   Past Hormone Therapy No     What other symptoms do you have   Are you Having Heaviness or Tightness Yes   Are you having Pain Yes   Are you having pitting edema Yes   Body Site lateral trunk   Is it Hard or Difficult finding clothes that fit No   Do you have infections No     Lymphedema Assessments   Lymphedema Assessments Upper extremities     Right Upper Extremity Lymphedema   15 cm Proximal to Olecranon Process 30.3 cm   Olecranon Process 25 cm   15 cm Proximal to Ulnar Styloid Process 24 cm   Just Proximal to Ulnar Styloid Process 15.5 cm   Across Hand at [employer] 19 cm   At [place] of 2nd Digit 6.4 cm     Left Upper Extremity Lymphedema   15 cm Proximal to Olecranon Process 30.5 cm   Olecranon Process 24.8 cm   15 cm Proximal to Ulnar Styloid Process 24 cm   Just Proximal to Ulnar Styloid Process 15.6 cm   Across Hand at [employer] 17.5 cm   At [place] of 2nd Digit 6 cm           Quick Dash - 06/08/16 0001    Open a tight or new jar Severe difficulty   Do heavy household chores (wash walls, wash floors) Unable   Carry a shopping bag or briefcase Unable   Wash your back Moderate difficulty   Use a knife to cut food  Mild difficulty   Recreational activities in which you take some force or impact through your arm, shoulder, or hand (golf, hammering, tennis) Unable   During the past week, to what extent has your arm, shoulder or hand problem interfered with your normal social activities with family, friends, neighbors, or groups? Slightly   During the past week, to what extent has your arm, shoulder or hand problem limited your work or other regular daily activities Quite a bit   Arm, shoulder, or hand pain. Extreme   Tingling (pins and needles) in your arm, shoulder, or hand Extreme   Difficulty Sleeping Severe difficulty   DASH Score 75 %                     PT Education - 06/08/16 1451    Education provided Yes   Education Details lymphedema risk reduction, anatomy and physiology of lymphatic system, post op breast cancer exercises   Person(s) Educated Patient;Spouse   Methods Explanation;Handout   Comprehension Verbalized understanding           Long Term Clinic Goals - 06/08/16 1508      CC Long Term Goal  #1   Title Pt will demonstrate 170 degrees of right shoulder flexion to allow her to reach items overhead   Baseline 139   Time 4   Period Weeks   Status New     CC Long Term Goal  #2   Title Pt will demonstrate 160 degrees of bilateral shoulder abduction ROM to allow her to reach out to her sides   Baseline R 105, L 116   Time 4   Period Weeks   Status New     CC Long Term Goal  #3  Title Pt will be able to independently verbalize lymphedema risk reduction practices    Time 4   Period Weeks   Status New     CC Long Term Goal  #4   Title Pt will be independent in a home exercise program for continued strengthening and stretching   Time 4   Period Weeks   Status New     CC Long Term Goal  #5   Title Pt will report a 50% improvement in pain in right flank to allow improved comfort   Time 4   Period Weeks   Status New     CC Long Term Goal  #6   Title Pt  to report a 50% improvement in bilateral trunk edema to allow improved comfort   Time 4   Period Weeks   Status New     Additional Goals   Additional Goals Yes            Plan - 06/08/16 1446    Clinical Impression Statement Patient presents to physical therapy today following bilateral mastectomy for treatment of right breast cancer on 05/19/16. She has expanders placed and had her drains removed last week. She having decreased bilateral shoulder ROM and strength. She also demonstrates localized edema in bilateral axilla and lateral trunk with right worse than left. 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 She states the pain has improved somewhat since surgery but still exisits. She would like to address this as well as her decreased bilateral shoulder ROM. She will begin radiation soon and does not require chemotherapy. Patient will benefit from skilled PT services to increase bilateral shoulder ROM, increase bilateral shoulder strength, decrease localized truncal edema, and decrease right flank pain. This evaluation was of moderate complexity due to previous kidney surgery with existing pain and her condition is evolving since she will begin chemotherapy.   Rehab Potential Good   Clinical Impairments Affecting Rehab Potential pt to begin radiation   PT Frequency 2x / week   PT Duration 4 weeks   PT Treatment/Interventions ADLs/Self Care Home Management;Therapeutic exercise;Therapeutic activities;Patient/family education;Manual techniques;Manual lymph drainage;Compression bandaging;Scar mobilization;Passive range of motion;Taping   PT Next Visit Plan begin gentle AAROM/PROM/AROM to bilateral shoulders, begin MLD to lateral trunk edema   PT Home Exercise Plan post op breast cancer exercises   Consulted and Agree with Plan of Care Patient   Family Member Consulted husband       Patient will benefit from skilled therapeutic intervention in order to improve the following deficits and impairments:  Postural dysfunction, Decreased knowledge of precautions, Pain, Impaired UE functional use, Decreased range of motion, Increased muscle spasms, Decreased scar mobility, Decreased activity tolerance  Visit Diagnosis: Stiffness of right shoulder, not elsewhere classified - Plan: PT plan of care cert/re-cert  Acute pain of right shoulder - Plan: PT plan of care cert/re-cert  Stiffness of left shoulder, not elsewhere classified - Plan: PT plan of care cert/re-cert  Acute pain of left shoulder - Plan: PT plan of care cert/re-cert  Muscle weakness (generalized) - Plan: PT plan of care cert/re-cert  Localized edema - Plan: PT plan of care cert/re-cert     Problem List Patient Active Problem List   Diagnosis Date Noted  . Breast cancer, right ([place]) 05/19/2016  . Malignant neoplasm of overlapping sites of right breast in female, estrogen receptor positive ([place]) 03/31/2016  . Female  pelvic peritoneal adhesions 10/26/2012  . Hydrosalpinx 10/25/2012  . Sciatica of right side 03/31/2012  . Routine general medical examination at a health care facility 08/27/2010  . SPONTANEOUS ECCHYMOSES 01/03/2009  . ANXIETY DISORDER 07/04/2007  . HYPOTHYROIDISM 06/23/2007  . HEMORRHOIDS 06/23/2007  . ESOPHAGEAL STRICTURE 06/23/2007  . GERD 06/23/2007  . DEGENERATIVE JOINT DISEASE 06/23/2007  . ARTHRITIS 06/23/2007  . SLEEP APNEA 06/23/2007    Allyson Sabal [facility] 06/08/2016, 3:17 PM  [place], [place], [address] Phone: [phone]   Fax:  [phone]  Name: [name] MRN: [id number] Date of Birth: 04/29/1960   Manus Gunning, PT 06/08/16 3:21 PM

## 2016-06-09 ENCOUNTER — Telehealth: Payer: Self-pay | Admitting: Hematology

## 2016-06-09 NOTE — Telephone Encounter (Signed)
Spoke with patient and confirmed appointment change.

## 2016-06-11 ENCOUNTER — Encounter: Payer: Self-pay | Admitting: Physical Therapy

## 2016-06-11 ENCOUNTER — Ambulatory Visit: Payer: BLUE CROSS/BLUE SHIELD | Admitting: Physical Therapy

## 2016-06-11 DIAGNOSIS — M6281 Muscle weakness (generalized): Secondary | ICD-10-CM | POA: Diagnosis not present

## 2016-06-11 DIAGNOSIS — R600 Localized edema: Secondary | ICD-10-CM | POA: Diagnosis not present

## 2016-06-11 DIAGNOSIS — R293 Abnormal posture: Secondary | ICD-10-CM | POA: Diagnosis not present

## 2016-06-11 DIAGNOSIS — M25612 Stiffness of left shoulder, not elsewhere classified: Secondary | ICD-10-CM | POA: Diagnosis not present

## 2016-06-11 DIAGNOSIS — M25511 Pain in right shoulder: Secondary | ICD-10-CM

## 2016-06-11 DIAGNOSIS — M25512 Pain in left shoulder: Secondary | ICD-10-CM | POA: Diagnosis not present

## 2016-06-11 DIAGNOSIS — M25611 Stiffness of right shoulder, not elsewhere classified: Secondary | ICD-10-CM | POA: Diagnosis not present

## 2016-06-11 NOTE — Patient Instructions (Signed)
Do both of these laying down:  Cane Exercise: Abduction    Hold cane with right hand over end, palm-up, with other hand palm-down. Move arm out from side and up by pushing with other arm. Hold __2__ seconds. Repeat __5__ times (progress your way up to 10 times). Do __2__ sessions per day. Repeat with the left hand.  http://gt2.exer.us/82   Copyright  VHI. All rights reserved.  Flexion (Eccentric) - Active (Cane)    Lift cane with both hands. Avoid hiking shoulders. Lower cane slowly for 3-5 seconds. _10__ reps per set, __2_ sets per day, _7__ days per week.  http://ecce.exer.us/155   Copyright  VHI. All rights reserved.

## 2016-06-12 NOTE — Progress Notes (Signed)
[place]  [phone] [phone]  Clinic Follow Up Note   Patient Care Team: Delia Chimes, NP (Inactive) as PCP - General (Nurse Practitioner) Magnus Sinning, MD (Inactive) (Orthopedic Surgery) Rutherford Guys, MD (Ophthalmology) Lavonna Monarch, MD (Dermatology) Truitt Merle, MD as Consulting Physician (Hematology) Rolm Bookbinder, MD as Consulting Physician (General Surgery) Kyung Rudd, MD as Consulting Physician (Radiation Oncology)   CHIEF COMPLAINTS: Right breast cancer  Oncology History   Cancer Staging Malignant neoplasm of overlapping sites of right breast in female, estrogen receptor positive [facility]) Staging form: Breast, AJCC 8th Edition - Clinical stage from 03/23/2016: Stage IIA (cT3, cN1, cM0, G2, ER: Positive, PR: Positive, HER2: Negative) - Signed by Truitt Merle, MD on 03/31/2016 - Pathologic stage from 05/19/2016: Stage IA (pT2(m), pN1a, cM0, G1, ER: Positive, PR: Positive, HER2: Negative) - Signed by Truitt Merle, MD on 06/18/2016       Malignant neoplasm of overlapping sites of right breast in female, estrogen receptor positive ([place])   03/17/2016 Mammogram    B/l diagnostic mammogram and righ US showed a 3.6cm irregular mass in the right breast 11:00 position, posterior depth, there is a enlarged lymph node in the right axilla is highly suspicious for malignancy. additional 7 mm oval mass in the right breast lower outer quadrant is suspicious for malignancy.      03/23/2016 Initial Biopsy    Right breast 9:30 position biopsy showed invasive ductal carcinoma, grade 1. 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      03/23/2016 Receptors her2    Both breast and node biopsy tumor ER 100% positive, PR 70-95% positive, HER-2 negative, Ki-67 40%      03/23/2016 Initial Diagnosis    Malignant neoplasm of upper-outer quadrant of right breast in female, estrogen receptor positive ([place])      03/25/2016 Initial Biopsy   Right breast 11:00 position core needle biopsy showed invasive duct carcinoma, grade 2.       03/25/2016 Receptors her2    ER 95% positive, PR 90% positive, HER-2 negative, Ki-67 15%      03/30/2016 Imaging    Bilateral breast MRI with and without contrast showed a large lobulated enhancing mass within the upper-outer and lower outer right breast with surrounding nodularity, measuring 6.1 x 4.4 x 5.6 cm. Multiple critically sick and right axillary lymph nodes are demonstrated measuring up to 1.5 cm.       05/19/2016 Surgery    Bilateral mastectomy and right axillary regional lymph node resection.      05/19/2016 Pathology Results    -Right axillary regional lymph node resection revealed meastatic carcinoma in 2/7 lymph nodes. -Left simple mastectomy revealed lobular neoplasia and fibrocystic changes with adenosis and calcifications. 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 -Skin of the right mastectomy flap was benign. -mpT2, pN1a       HISTORY OF PRESENTING ILLNESS (04/01/2016):  [name] 56 y.o. female is here because of recent diagnosis of right breast carcinoma with metastatic adenopathy. She is accompanied by her husband and sister to our multidisciplinary breast clinic today.  She had right breast cystic lesion in the past and was aspirated/biopsied, a total of 3 times in the past, she has beeing doing annual mammogram but did not have it in 2017.   She felt a right breast lump one months  ago, and was seen by her PCP. She also report sighicant pain at the breat mass site, persistent but fluctuates,  sometime10/10, no skin change or nipple discharge.  She has chronic back pain, she takes vocodin as needed, not very often. she also report anxiety and anorexia since the  cancer diagnosis.   Biopsy of right breast at 9:30 o'clock position on 03/23/16 showed invasive mammary carcinoma. Biopsy of the right breast at the 11:00 o'clock position showed fibrocystic change and dense fibrosis with no malignancy identified. Right lymph node was positive for metastatic mammary carcinoma. Pathology revealed carcinoma appearing grade 1, ER/PR positive, HER-2 negative, Ki-67 40%. E-cadherin is positive, consistent with a ductal phenotype.  Repeat biopsy of the right breast at the 11:00 o'clock position on 03/25/16 showed invasive mammary carcinoma. Pathology revealed carcinoma appearing grade 2. An E-Cadherin stain was performed revealing that some tumor cells (approximately 10%) are positive for E-Cadherin, supporting at least a focal ductal phenotype. The remainder of the tumor cells appear negative for E-Cadherin.  Bilateral breast MRI on 03/29/16 showed large irregular masslike area of enhancement within the upper outer and lower outer right breast compatible with biopsy-proven malignancy. Also seen were enlarged right axillary lymph nodes compatible with metastatic adenopathy.  She had hysterectomy due to a neuralgia in 2011, has had menopause symptoms (hot flashes and mood swing) for over a year now, overall tolerable. But she has been quite anxious since her cancer diagnosis, does not sleep well. Her appetite has also dropped since her cancer diagnosis.  GYN HISTORY  Menarchal: 10 LMP: 11/06/2009 (hysterectomy)  Contraceptive: yes, <5 years  HRT: non G2P2: two daughters age of 21 and 64, no breast feeding   Current Therapy: Exemestane 25 mg started on 04/01/16. Pending radiation.  Interval History: [name] returns for follow up with her husband. The patient has bilateral breast pain from her bilateral mastectomy and placement of tissue expanders. She is still taking exemestane, tolerating well. She reports good response to Effexor, her hot flush and mood all  significantly improved and she is very happy about that.   MEDICAL HISTORY:  Past Medical History:  Diagnosis Date  . Anxiety   . Anxiety disorder   . Asthma    h/o asthma as a child  . Cancer ([place]) 04/2016   right breast cancer  . Cholelithiasis   . Depression   . Diverticulosis   . DJD (degenerative joint disease)   . Epigastric abdominal pain   . Esophageal stricture   . Family hx of colon cancer   . 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 Ganglion cyst   . GERD (gastroesophageal reflux disease)   . Hemorrhoid   . Hypothyroidism   . PONV (postoperative nausea and vomiting)     SURGICAL HISTORY: Past Surgical History:  Procedure Laterality Date  . ABDOMINAL HYSTERECTOMY    . BREAST RECONSTRUCTION WITH PLACEMENT OF TISSUE EXPANDER AND FLEX HD (ACELLULAR HYDRATED DERMIS) Bilateral 05/19/2016   Procedure: BREAST RECONSTRUCTION WITH PLACEMENT OF TISSUE EXPANDER AND ALLODERM PLACEMENT;  Surgeon: Irene Limbo, MD;  Location: [place];  Service: Plastics;  Laterality: Bilateral;  . CESAREAN SECTION    . CHOLECYSTECTOMY    . HEMORRHOID SURGERY    . LAPAROSCOPIC BILATERAL SALPINGECTOMY N/A 10/26/2012   Procedure: operative laparoscopy with lysis of adhesions;  Surgeon: Thornell Sartorius, MD;  Location: [facility];  Service: Gynecology;  Laterality: N/A;  . MASTECTOMY WITH RADIOACTIVE SEED GUIDED EXCISION AND AXILLARY SENTINEL LYMPH NODE BIOPSY  Bilateral 05/19/2016   Procedure: RIGHT SKIN SPARING MASTECTOMY WITH RIGHT RADIOACTIVE SEED TARGETED DISSECTION AND RIGHT SENTINEL LYMPH NODE BIOPSY, LEFT PROPHYLACTIC SKIN SPARING MASTECTOMY;  Surgeon: Rolm Bookbinder, MD;  Location: [place];  Service: General;  Laterality: Bilateral;  . TUBAL LIGATION    . urologic surgery for ureteropelvic junction obstruction      SOCIAL HISTORY: Social History   Social History  . Marital status: Married    Spouse name: N/A  . Number of children: 2  . Years of  education: 14   Occupational History  . auditor, Passenger transport manager [place] History Main Topics  . Smoking status: Former Smoker    Packs/day: 0.50    Years: 15.00    Types: Cigarettes    Quit date: 04/08/2016  . Smokeless tobacco: Never Used  . Alcohol use 8.4 oz/week    12 Cans of beer, 2 Standard drinks or equivalent per week     Comment: moderate, daily   . Drug use: No  . Sexual activity: Yes    Partners: Male    Birth control/ protection: Surgical   Other Topics Concern  . Not on file   Social History Narrative   HSG, some community college. .Married -'81.   2 daughters  '83, '88 daughter  with bipolar dz. Has had behavior issues. 1 granddaughter '05 living with her. Occupation:Bank worker. Marriage in good. No history of abuse.                   FAMILY HISTORY: Family History  Problem Relation Age of Onset  . COPD Mother   . Cancer Mother   . Diabetes Father   . Heart disease Father   . Hypertension Father   . Cancer Father     colon cancer  . Hypertension Sister   . Depression Sister   . Arthritis Sister   . Cancer Paternal Grandmother     ? breast cancer    ALLERGIES:  is allergic to adhesive [tape] and promethazine hcl.  MEDICATIONS:  Current Outpatient Prescriptions  Medication Sig Dispense Refill  . ALPRAZolam (XANAX) 1 MG tablet Take 1 tablet (1 mg total) by mouth 3 (three) times daily. 90 tablet 0  . celecoxib (CELEBREX) 200 MG capsule Take 1 capsule (200 mg total) by mouth daily. 30 capsule 2  . exemestane (AROMASIN) 25 MG tablet Take 1 tablet (25 mg total) by mouth daily after breakfast. 90 tablet 3  . HYDROcodone-acetaminophen (NORCO/VICODIN) 5-325 MG tablet Take 1 tablet by mouth every 4 (four) hours as needed.    Marland Kitchen levothyroxine (SYNTHROID, LEVOTHROID) 50 MCG tablet TAKE 1 TABLET EVERY DAY 90 tablet 2  . methocarbamol (ROBAXIN) 500 MG tablet Take 1 tablet (500 mg total) by mouth every 8 (eight) hours as needed for muscle  spasms. 30 tablet 0  . omeprazole (PRILOSEC) 40 MG capsule TAKE ONE CAPSULE BY MOUTH EVERY DAY 30 capsule 4  . polyethylene glycol (MIRALAX / GLYCOLAX) packet Take 17 g by mouth every other day.    . senna (SENOKOT) 8.6 MG tablet Take 1 tablet by mouth daily.    Marland Kitchen venlafaxine XR (EFFEXOR XR) 37.5 MG 24 hr capsule Take 1 capsule (37.5 mg total) by mouth daily with breakfast. 90 capsule 3   No current facility-administered medications for this visit.     REVIEW OF SYSTEMS:   Constitutional: Denies fevers, chills or abnormal night sweats Eyes: Denies blurriness of vision, double vision or watery  eyes Ears, nose, mouth, throat, and face: Denies mucositis or sore throat Respiratory: Denies cough, dyspnea or wheezes Cardiovascular: Denies palpitation, chest discomfort or lower extremity swelling Gastrointestinal:  Denies nausea, heartburn or change in bowel habits Skin: Denies abnormal skin rashes Lymphatics: Denies new lymphadenopathy or easy bruising Neurological:Denies numbness, tingling or new weaknesses Behavioral/Psych: Mood is stable, no new changes  Breast (+) Bilateral chest pain from mastectomies and placement of tissue expanders. All other systems were reviewed with the patient and are negative.  PHYSICAL EXAMINATION: ECOG PERFORMANCE STATUS: 0 - Asymptomatic  Vitals:   06/17/16 1425  BP: 129/67  Pulse: 80  Resp: 18  Temp: 97.9 F (36.6 C)   Filed Weights   06/17/16 1425  Weight: 172 lb 11.2 oz (78.3 kg)    GENERAL:alert, no distress and comfortable SKIN: skin color, texture, turgor are normal, no rashes or significant lesions EYES: normal, conjunctiva are pink and non-injected, sclera clear OROPHARYNX:no exudate, no erythema and lips, buccal mucosa, and tongue normal  NECK: supple, thyroid normal size, non-tender, without nodularity LYMPH:  no palpable lymphadenopathy in the cervical, axillary or inguinal LUNGS: clear to auscultation and percussion with normal  breathing effort HEART: regular rate & rhythm and no murmurs and no lower extremity edema ABDOMEN:abdomen soft, non-tender and normal bowel sounds Musculoskeletal:no cyanosis of digits and no clubbing  PSYCH: alert & oriented x 3 with fluent speech NEURO: no focal motor/sensory deficits Breasts: Status post bilateral mastectomy. Incisions healed well. No erythema or discharge. Right breast appears a little smaller than left. No palpable mass or adenopathy.   LABORATORY DATA:  I have reviewed the data as listed CBC Latest Ref Rng & Units 04/01/2016 04/10/2013 10/25/2012  WBC 3.9 - 10.3 10e3/uL 6.0 - 6.8  Hemoglobin 11.6 - 15.9 g/dL 14.7 15.4(H) 14.7  Hematocrit 34.8 - 46.6 % 44.9 47.1(H) 42.9  Platelets 145 - 400 10e3/uL 203 - 209    CMP Latest Ref Rng & Units 04/01/2016 11/06/2015 04/10/2013  Glucose 70 - 140 mg/dl 96 109(H) 97  BUN 7.0 - 26.0 mg/dL 16.0 16 10  Creatinine 0.6 - 1.1 mg/dL 0.9 0.91 0.9  Sodium 136 - 145 mEq/L 139 141 141  Potassium 3.5 - 5.1 mEq/L 3.9 4.5 3.7  Chloride 98 - 110 mmol/L - 104 104  CO2 22 - 29 mEq/L '27 25 29  ' Calcium 8.4 - 10.4 mg/dL 9.3 9.5 9.4  Total Protein 6.4 - 8.3 g/dL 6.6 - -  Total Bilirubin 0.20 - 1.20 mg/dL 0.64 - -  Alkaline Phos 40 - 150 U/L 92 - -  AST 5 - 34 U/L 20 - -  ALT 0 - 55 U/L 21 - -   PATHOLOGY  Diagnosis (05/19/16) 1. Lymph nodes, regional resection, Right axillary - METASTATIC CARCINOMA IN 2 OF 7 LYMPH NODES (2/7). - SEE COMMENT. 2. Breast, simple mastectomy, Left - LOBULAR NEOPLASIA (ATYPICAL LOBULAR HYPERPLASIA). - FIBROCYSTIC CHANGES WITH ADENOSIS AND CALCIFICATIONS. 3. Breast, simple mastectomy, Right - INVASIVE MIXED LOBULAR-DUCTAL CARCINOMA GRADE I/III, MULTIPLE FOCI, THE LARGEST SPANS 3.0 CM. - LOBULAR NEOPLASIA (ATYPICAL LOBULAR HYPERPLASIA). - ATYPICAL DUCTAL HYPERPLASIA. - LYMPHOVASCULAR INVASION IS IDENTIFIED. - THE SURGICAL RESECTION MARGINS ARE NEGATIVE FOR CARCINOMA. - SEE ONCOLOGY TABLE BELOW. 4. Skin , Right  Mastectomy Flap - BENIGN SKIN. Microscopic Comment 1. Immunohistochemical stains performed on multiple blocks on part 1 highlight the presence of carcinoma. 3. BREAST Procedure: Simple mastectomy and right axillary lymph node resection. Laterality: Carcinoma is on the right. Tumor Size: Multiple (at least  6) foci spanning from microscopic foci to a lesion grossly measuring 3.0 cm. Histologic Type: Mixed lobular-tubular. Grade: I Tubular Differentiation: 1-2 Nuclear Pleomorphism: 2 Mitotic Count: 1 Ductal Carcinoma in Situ (DCIS): Not identified, however, atypical lobular hyperplasia and atypical ductal hyperplasia are identified. Regional Lymph Nodes: Number of Lymph Nodes Examined: 7 1 of 4 FINAL for Katie, Hogan ([id number]) Microscopic Comment(continued) Number of Sentinel Lymph Nodes Examined: Unknown. Lymph Nodes with Macrometastases: 2 Lymph Nodes with Micrometastases: 0 Lymph Nodes with Isolated Tumor Cells: 0 Margins: Greater than 0.2 cm to all margins Extent of Tumor: Confined to breast parenchyma. Breast Prognostic Profile # [id number] Estrogen Receptor: Positive, strong. Progesterone Receptor: Positive, strong. Her2: No amplification was detected. Ki-67: Ranges from 15% - 40%. TNM: mpT2, pN1a COMMENTS: There are multiple biopsy clips identified in the right mastectomy specimen. Grossly, there are at least four foci of similar appearing invasive lobular-tubular carcinoma present, the largest spans 3.0 cm. In addition, there is similar appearing invasive carcinoma present in random tissue submitted from the upper inner quadrant and the upper outer quadrant. The phenotype diagnosed above is supported by patchy but strong staining for E-cadherin in some places and negative for E-cadherin in other places. Most (approximately 80%) of the carcinoma appears negative for e-cadherin, so the phenotype is predominantly lobular. No additional prognostic profiles will  be performed, unless requested by a clinician. (JBK:gt, 05/21/16) Enid Cutter MD Pathologist, Electronic Signature (Case signed 05/21/2016)  Mammaprint 03/25/16   Diagnosis 03/25/2016 Breast, right, needle core biopsy, 11:00 o'clock - INVASIVE MAMMARY CARCINOMA. - SEE COMMENT. Microscopic Comment The carcinoma appears grade 2. An E-cadherin stain will be performed and the results reported separately. A breast prognostic profile will be performed and the results reported separately. The results were called to [facility] on 03/26/16. (JBK:gt, 03/26/16) An E-Cadherin stain was performed revealing that some tumor cells (approximately 10%) are positive for E-Cadherin, supporting at least a focal ductal phenotype. The remainder of the tumor cells appear negative for E-Cadherin. (JBK:kh 03/27/16)  Results: IMMUNOHISTOCHEMICAL AND MORPHOMETRIC ANALYSIS PERFORMED MANUALLY Estrogen Receptor: 95%, POSITIVE, STRONG STAINING INTENSITY Progesterone Receptor: 90%, POSITIVE, STRONG STAINING INTENSITY Proliferation Marker Ki67: 15%  Results: HER2 - NEGATIVE RATIO OF HER2/CEP17 SIGNALS 1.12 AVERAGE HER2 COPY NUMBER PER CELL 1.90  Diagnosis 03/23/2016 1. Breast, right, needle core biopsy, 9:30 o'clock - INVASIVE MAMMARY CARCINOMA, SEE COMMENT. 2. Breast, right, needle core biopsy, 11:00 o'clock - FIBROCYSTIC CHANGE AND DENSE FIBROSIS. - NO MALIGNANCY IDENTIFIED. 3. Lymph node, needle/core biopsy, right - METASTATIC MAMMARY CARCINOMA. Microscopic Comment 1. While grading is best performed on the resection specimen, the carcinoma appears grade 1. While a ductal phenotype is favored, an e-cadherin will be performed and reported in an add-on. Prognostic markers will be ordered. Dr. Saralyn Pilar has reviewed all 3 parts of the case. The case was called to Dr. Marcelo Baldy on 03/24/2016.  1. PROGNOSTIC INDICATORS Results: IMMUNOHISTOCHEMICAL AND MORPHOMETRIC ANALYSIS PERFORMED MANUALLY Estrogen  Receptor: 100%, POSITIVE, STRONG STAINING INTENSITY Progesterone Receptor: 70%, POSITIVE, STRONG STAINING INTENSITY Proliferation Marker Ki67: 40%  Results: HER2 - NEGATIVE RATIO OF HER2/CEP17 SIGNALS 1.17 AVERAGE HER2 COPY NUMBER PER CELL 2.10  3. PROGNOSTIC INDICATORS Results: IMMUNOHISTOCHEMICAL AND MORPHOMETRIC ANALYSIS PERFORMED MANUALLY Estrogen Receptor: 100%, POSITIVE, STRONG STAINING INTENSITY Progesterone Receptor: 95%, POSITIVE, STRONG STAINING INTENSITY Proliferation Marker Ki67: 40% HER2 - NEGATIVE  1. E-cadherin is positive, consistent with a ductal phenotype.  RADIOGRAPHIC STUDIES: I have personally reviewed the radiological images as listed and agreed with the findings in the report.  Nm Sentinel Node Inj-no Rpt (breast)  Result Date: 06/02/2016 Sulfur colloid was injected intradermally by the nuclear medicine technologist for breast cancer sentinel node localization  MR Breast Bilateral w wo contrast 03/29/16 IMPRESSION: Large irregular masslike area of enhancement within the upper outer and lower outer right breast compatible with biopsy-proven malignancy. Enlarged right axillary lymph nodes compatible with metastatic adenopathy.  ASSESSMENT & PLAN: 56 y.o.  postmenopausal woman, presented with a palpable right breast mass.  1.  Malignant new overlapping sites of right breast, mpT2 pN1a, stage IA, G1,  ER positive, PR positive, HER-2 negative, mammaprint low risk luminal type A --I discussed her surgical path result in details.  -She had a complete surgical resection with negative margins, 2 out of 17 lymph nodes was positive. -the mammaprint result was reviewed with her in details. She has low risk luminal type A, her risk of recurrence is low, I did not recommend adjuvant chemotherapy.  -She started exemestane before surgery, has been tolerating well without significant side effects, we'll continue, plan for 7-10 years. -She will proceed with breast radiation  next. I sent a message to her radiation oncologist Dr. Lisbeth Renshaw today.  -We also discussed the breast cancer surveillance after her surgery. She will continue annual screening mammogram, self exam, and a routine office visit with lab and exam with [place]. -I encouraged her to have healthy diet and exercise regularly. -The patient saw Dr. Iran Planas of plastic surgery on 06/15/16 who discussed implant exchange in late December/approximately 6 months from the end of radiation. -The patient will proceed with radiation to the right chest wall and right axilla starting 06/25/16. -She will return to med/onc after she completes radiation.  Plan: -Lab and f/u in 3 months. -Continue exemestane through radiation. -Start radiation next week.  No orders of the defined types were placed in this encounter.   All questions were answered. The patient knows to call the clinic with any problems, questions or concerns. I spent 20 minutes counseling the patient face to face. The total time spent in the appointment was 30 minutes and more than 50% was on counseling.   Truitt Merle, MD 06/17/2016    This document serves as a record of services personally performed by Truitt Merle, MD. It was created on her behalf by Darcus Austin, a trained medical scribe. The creation of this record is based on the scribe's personal observations and the provider's statements to them. This document has been checked and approved by the attending provider.

## 2016-06-15 ENCOUNTER — Encounter: Payer: Self-pay | Admitting: *Deleted

## 2016-06-15 NOTE — Therapy (Signed)
[place], [place], [address] Phone: [phone]   Fax:  [phone]  Physical Therapy Treatment  Patient Details  Name: [name] MRN: [id number] Date of Birth: 01/29/1961 Referring Provider: Hayden Pedro  Encounter Date: 06/11/2016  Visit number 2 of 9 Date for Re-evaluation: 07/06/16 PT start time: 33 PT end time: 1145 PT time calculation: 50 minutes  Past Medical History:  Diagnosis Date  . Anxiety   . Anxiety disorder   . Asthma    h/o asthma as a child  . Cancer ([place]) 04/2016   right breast cancer  . Cholelithiasis   . Depression   . Diverticulosis   . DJD (degenerative joint disease)   . Epigastric abdominal pain   . Esophageal stricture   . Family hx of colon cancer   . 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 Ganglion cyst   . GERD (gastroesophageal reflux disease)   . Hemorrhoid   . Hypothyroidism   . PONV (postoperative nausea and vomiting)     Past Surgical History:  Procedure Laterality Date  . ABDOMINAL HYSTERECTOMY    . BREAST RECONSTRUCTION WITH PLACEMENT OF TISSUE EXPANDER AND FLEX HD (ACELLULAR HYDRATED DERMIS) Bilateral 05/19/2016   Procedure: BREAST RECONSTRUCTION WITH PLACEMENT OF TISSUE EXPANDER AND ALLODERM PLACEMENT;  Surgeon: Irene Limbo, MD;  Location: [facility];  Service: Plastics;  Laterality: Bilateral;  . CESAREAN SECTION    . CHOLECYSTECTOMY    . HEMORRHOID SURGERY    . LAPAROSCOPIC BILATERAL SALPINGECTOMY N/A 10/26/2012   Procedure: operative laparoscopy with lysis of adhesions;  Surgeon: Thornell Sartorius, MD;  Location: [facility];  Service: Gynecology;  Laterality: N/A;  . MASTECTOMY WITH RADIOACTIVE SEED GUIDED EXCISION AND AXILLARY SENTINEL LYMPH NODE BIOPSY Bilateral 05/19/2016   Procedure: RIGHT SKIN SPARING MASTECTOMY WITH RIGHT RADIOACTIVE SEED TARGETED DISSECTION AND RIGHT SENTINEL LYMPH NODE BIOPSY, LEFT  PROPHYLACTIC SKIN SPARING MASTECTOMY;  Surgeon: Rolm Bookbinder, MD;  Location: [facility];  Service: General;  Laterality: Bilateral;  . TUBAL LIGATION    . urologic surgery for ureteropelvic junction obstruction      There were no vitals filed for this visit.   Treatment: Therapeutic Exercise: Supine cane flexion and abduction x5 each with PT verbal cues for technique and safety Supine AAROM bilateral shoulder flexion with hands clasped together x10 Shoulder pulleys: Flexion x2 minutes and abduction x2 minutes with PT verbal cues for proper technique. Therapy ball: Flexion x10 and abduction x5 reps  Manual Therapy: PROM bilateral shoulders supine all planes to pt tolerance with most time spent on right shoulder as that is more limited from sentinel node biopsy. Neural stretch to right upper extremity in supine to pt tolerance  Patient Education: Patient was educated on supine cane exercises using demonstration, explanation, and a handout. She verbalized understanding and return demonstrated exercises.   Plan: Clinical Impression: Patient is doing incredibly well for 3 weeks post op! She tolerated all therapy today without c/o pain increase. She took a muscle relaxer before coming in to therapy so that probably helped too. She has concerns about her reconstruction related to breast placement (feels they ar to far apart) and that her left side is much higher than her right. Encouraged her to discuss these concerns with Dr. Iran Planas when she sees her next Monday.  Patient will benefit from skilled therapeutic intervention in order to improve on the following deficits: Postural dysfunction;Decreased knowledge of precautions;Pain;Impaired UE functional use;Decreased range of motion;Increased  muscle spasms;Decreased scar mobility;Decreased activity tolerance  Rehab Potential: Good  Clinical Impairments Affecting Rehab Potential: Patient is beginning  radiation.  Rehab frequency: 2x/week for 4 weeks  Treatments / interventions: ADLs/Self Care Home Management;Therapeutic exercise;Therapeutic activities;Patient/family education;Manual techniques;Manual lymph drainage;Compression bandaging;Scar mobilization;Passive range of motion;Taping;  Patient consulted and agrees with plan of care.      [facility] Goals - 06/08/16 1508      CC Long Term Goal  #1   Title Pt will demonstrate 170 degrees of right shoulder flexion to allow her to reach items overhead   Baseline 139   Time 4   Period Weeks   Status New     CC Long Term Goal  #2   Title Pt will demonstrate 160 degrees of bilateral shoulder abduction ROM to allow her to reach out to her sides   Baseline R 105, L 116   Time 4   Period Weeks   Status New     CC Long Term Goal  #3   Title Pt will be able to independently verbalize lymphedema risk reduction practices    Time 4   Period Weeks   Status New     CC Long Term Goal  #4   Title Pt will be independent in a home exercise program for continued strengthening and stretching   Time 4   Period Weeks   Status New     CC Long Term Goal  #5   Title Pt will report a 50% improvement in pain in right flank to allow improved comfort   Time 4   Period Weeks   Status New     CC Long Term Goal  #6   Title Pt to report a 50% improvement in bilateral trunk edema to allow improved comfort   Time 4   Period Weeks   Status New     Additional Goals   Additional Goals Yes          Patient will benefit from skilled therapeutic intervention in order to improve the following deficits and impairments:  Postural dysfunction, Decreased knowledge of precautions, Pain, Impaired UE functional use, Decreased range of motion, Increased muscle spasms, Decreased scar mobility, Decreased activity tolerance  Visit Diagnosis: Stiffness of right shoulder, not elsewhere classified  Acute pain of right shoulder  Stiffness of left  shoulder, not elsewhere classified  Acute pain of left shoulder  Muscle weakness (generalized)  Abnormal posture     Problem List Patient Active Problem List   Diagnosis Date Noted  . Breast cancer, right ([facility]) 05/19/2016  . Malignant neoplasm of overlapping sites of right breast in female, estrogen receptor positive ([facility]) 03/31/2016  . 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 Hydrosalpinx 10/25/2012  . Sciatica of right side 03/31/2012  . Routine general medical examination at a health care facility 08/27/2010  . SPONTANEOUS ECCHYMOSES 01/03/2009  . ANXIETY DISORDER 07/04/2007  . HYPOTHYROIDISM 06/23/2007  . HEMORRHOIDS 06/23/2007  . ESOPHAGEAL STRICTURE 06/23/2007  . GERD 06/23/2007  . DEGENERATIVE JOINT DISEASE 06/23/2007  . ARTHRITIS 06/23/2007  . SLEEP APNEA 06/23/2007    Annia Friendly, PT 06/15/16 11:56 AM  [place], [place], [address] Phone: [phone]   Fax:  [phone]  Name: ANNAJULIA LEWING MRN: [id number] Date of Birth: 12-18-1960

## 2016-06-15 NOTE — Progress Notes (Signed)
[facility] Psychosocial Distress Screening Clinical Social Work  Clinical Social Work was referred by distress screening protocol.  The patient scored a 8 on the Psychosocial Distress Thermometer which indicates moderate distress. Clinical Social Worker contacted patient at home to offer support and assess for distress and other psychosocial needs.  Patient expressed feeling overwhelmed with multiple appointments and starting radiation this week, but stated she had a strong support system at home.  CSw offered additional support and information on support programs at [facility].  Patient was appreciative of contact and plans to call CSW with additional needs or concerns.     ONCBCN DISTRESS SCREENING 06/04/2016  Screening Type Initial Screening  Distress experienced in past week (1-10) 8  Practical problem type   Emotional problem type Adjusting to illness;Adjusting to appearance changes  Spiritual/Religous concerns type   Physical Problem type Pain;Swollen arms/legs  Physician notified of physical symptoms Yes  Referral to clinical social work Yes    Johnnye Lana, MSW, LCSW, OSW-C Clinical Social Worker [facility] [phone]

## 2016-06-16 DIAGNOSIS — Z01419 Encounter for gynecological examination (general) (routine) without abnormal findings: Secondary | ICD-10-CM | POA: Diagnosis not present

## 2016-06-16 DIAGNOSIS — E039 Hypothyroidism, unspecified: Secondary | ICD-10-CM | POA: Diagnosis not present

## 2016-06-16 DIAGNOSIS — Z780 Asymptomatic menopausal state: Secondary | ICD-10-CM | POA: Diagnosis not present

## 2016-06-16 DIAGNOSIS — M8580 Other specified disorders of bone density and structure, unspecified site: Secondary | ICD-10-CM | POA: Diagnosis not present

## 2016-06-16 DIAGNOSIS — Z0000 Encounter for general adult medical examination without abnormal findings: Secondary | ICD-10-CM | POA: Diagnosis not present

## 2016-06-16 DIAGNOSIS — R0789 Other chest pain: Secondary | ICD-10-CM | POA: Diagnosis not present

## 2016-06-16 DIAGNOSIS — C50919 Malignant neoplasm of unspecified site of unspecified female breast: Secondary | ICD-10-CM | POA: Diagnosis not present

## 2016-06-17 ENCOUNTER — Telehealth: Payer: Self-pay | Admitting: Hematology

## 2016-06-17 ENCOUNTER — Ambulatory Visit
Admission: RE | Admit: 2016-06-17 | Discharge: 2016-06-17 | Disposition: A | Discharge status: Home / Self Care | Payer: BLUE CROSS/BLUE SHIELD | Source: Ambulatory Visit | Attending: Radiation Oncology | Admitting: Radiation Oncology

## 2016-06-17 ENCOUNTER — Ambulatory Visit (HOSPITAL_BASED_OUTPATIENT_CLINIC_OR_DEPARTMENT_OTHER): Payer: BLUE CROSS/BLUE SHIELD | Admitting: Hematology

## 2016-06-17 VITALS — BP 129/67 | HR 80 | Temp 97.9°F | Resp 18 | Ht 66.0 in | Wt 172.7 lb

## 2016-06-17 DIAGNOSIS — C773 Secondary and unspecified malignant neoplasm of axilla and upper limb lymph nodes: Secondary | ICD-10-CM

## 2016-06-17 DIAGNOSIS — Z170 Estrogen receptor positive status [ER+]: Principal | ICD-10-CM

## 2016-06-17 DIAGNOSIS — Z888 Allergy status to other drugs, medicaments and biological substances status: Secondary | ICD-10-CM | POA: Diagnosis not present

## 2016-06-17 DIAGNOSIS — Z800 Family history of malignant neoplasm of digestive organs: Secondary | ICD-10-CM | POA: Diagnosis not present

## 2016-06-17 DIAGNOSIS — Z833 Family history of diabetes mellitus: Secondary | ICD-10-CM | POA: Diagnosis not present

## 2016-06-17 DIAGNOSIS — Z510 Encounter for antineoplastic radiation therapy: Secondary | ICD-10-CM | POA: Diagnosis not present

## 2016-06-17 DIAGNOSIS — Z87891 Personal history of nicotine dependence: Secondary | ICD-10-CM | POA: Diagnosis not present

## 2016-06-17 DIAGNOSIS — Z79899 Other long term (current) drug therapy: Secondary | ICD-10-CM | POA: Diagnosis not present

## 2016-06-17 DIAGNOSIS — C50811 Malignant neoplasm of overlapping sites of right female breast: Secondary | ICD-10-CM

## 2016-06-17 DIAGNOSIS — Z8249 Family history of ischemic heart disease and other diseases of the circulatory system: Secondary | ICD-10-CM | POA: Diagnosis not present

## 2016-06-17 DIAGNOSIS — F419 Anxiety disorder, unspecified: Secondary | ICD-10-CM | POA: Diagnosis not present

## 2016-06-17 DIAGNOSIS — E039 Hypothyroidism, unspecified: Secondary | ICD-10-CM | POA: Diagnosis not present

## 2016-06-17 DIAGNOSIS — C50411 Malignant neoplasm of upper-outer quadrant of right female breast: Secondary | ICD-10-CM | POA: Diagnosis not present

## 2016-06-17 DIAGNOSIS — Z9013 Acquired absence of bilateral breasts and nipples: Secondary | ICD-10-CM | POA: Diagnosis not present

## 2016-06-17 DIAGNOSIS — F329 Major depressive disorder, single episode, unspecified: Secondary | ICD-10-CM | POA: Diagnosis not present

## 2016-06-17 DIAGNOSIS — Z825 Family history of asthma and other chronic lower respiratory diseases: Secondary | ICD-10-CM | POA: Diagnosis not present

## 2016-06-17 DIAGNOSIS — K219 Gastro-esophageal reflux disease without esophagitis: Secondary | ICD-10-CM | POA: Diagnosis not present

## 2016-06-17 NOTE — Telephone Encounter (Signed)
Gave patient avs report and appointments for July.  °

## 2016-06-18 ENCOUNTER — Ambulatory Visit: Payer: BLUE CROSS/BLUE SHIELD | Admitting: Physical Therapy

## 2016-06-18 ENCOUNTER — Encounter: Payer: Self-pay | Admitting: Physical Therapy

## 2016-06-18 ENCOUNTER — Encounter: Payer: Self-pay | Admitting: Hematology

## 2016-06-18 DIAGNOSIS — M6281 Muscle weakness (generalized): Secondary | ICD-10-CM

## 2016-06-18 DIAGNOSIS — M25611 Stiffness of right shoulder, not elsewhere classified: Secondary | ICD-10-CM

## 2016-06-18 DIAGNOSIS — M25511 Pain in right shoulder: Secondary | ICD-10-CM | POA: Diagnosis not present

## 2016-06-18 DIAGNOSIS — M25612 Stiffness of left shoulder, not elsewhere classified: Secondary | ICD-10-CM | POA: Diagnosis not present

## 2016-06-18 DIAGNOSIS — M25512 Pain in left shoulder: Secondary | ICD-10-CM | POA: Diagnosis not present

## 2016-06-18 DIAGNOSIS — R293 Abnormal posture: Secondary | ICD-10-CM

## 2016-06-18 DIAGNOSIS — R600 Localized edema: Secondary | ICD-10-CM | POA: Diagnosis not present

## 2016-06-18 NOTE — Progress Notes (Signed)
  Radiation Oncology         [phone]) [phone] ________________________________  Name: [name] MRN: [id number]  Date: 06/17/2016  DOB: 06/03/1960  DIAGNOSIS:     ICD-9-CM ICD-10-CM   1. Malignant neoplasm of overlapping sites of right breast in female, estrogen receptor positive ([facility]) 174.8 C50.811    V86.0 Z17.0      SIMULATION AND TREATMENT PLANNING NOTE  The patient presented for simulation prior to beginning her course of radiation treatment for her diagnosis of right-sided breast cancer. The patient was placed in a supine position on a breast board. A customized vac-lock bag was also constructed and this complex treatment device will be used on a daily basis during her treatment. In this fashion, a CT scan was obtained through the chest area and an isocenter was placed near the chest wall at the upper aspect of the right chest.  The patient will be planned to receive a course of radiation initially to a dose of 50.4 gray. This will consist of a 4 field technique targeting the right chest wall as well as the supraclavicular region. Therefore 2 customized medial and lateral tangent fields have been created targeting the chest wall, and also 2 additional customized fields have been designed to treat the supraclavicular region both with a right supraclavicular field and a right posterior axillary boost field. A forward planning/reduced field technique will also be evaluated to determine if this significantly improves the dose homogeneity of the overall plan. Therefore, additional customized blocks/fields may be necessary.  This initial treatment will be accomplished at 1.8 gray per fraction.   The initial plan will consist of a 3-D conformal technique. The target volume/scar, heart and lungs have been contoured and dose volume histograms of each of these structures will be evaluated as part of the 3-D conformal treatment planning process.   It is anticipated that the patient will then  receive a 10 gray boost to the surgical scar. This will be accomplished at 2 gray per fraction. The final anticipated total dose therefore will correspond to 60.4 gray.    _______________________________   Jodelle Gross, MD, PhD

## 2016-06-18 NOTE — Therapy (Signed)
[place], [place], [address] Phone: [phone]   Fax:  [phone]  Physical Therapy Treatment  Patient Details  Name: [name] MRN: [id number] Date of Birth: [birth year] Referring Provider: Hayden Pedro  Encounter Date: 06/18/2016      PT End of Session - 06/18/16 1138    Visit Number 3   Number of Visits 9   Date for PT Re-Evaluation 07/06/16   PT Start Time 1102   PT Stop Time 1148   PT Time Calculation (min) 46 min   Activity Tolerance Patient tolerated treatment well   Behavior During Therapy [facility] for tasks assessed/performed      Past Medical History:  Diagnosis Date  . Anxiety   . Anxiety disorder   . Asthma    h/o asthma as a child  . Cancer ([place]) 04/2016   right breast cancer  . Cholelithiasis   . Depression   . Diverticulosis   . DJD (degenerative joint disease)   . Epigastric abdominal pain   . Esophageal stricture   . Family hx of colon cancer   . 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 Ganglion cyst   . GERD (gastroesophageal reflux disease)   . Hemorrhoid   . Hypothyroidism   . PONV (postoperative nausea and vomiting)     Past Surgical History:  Procedure Laterality Date  . ABDOMINAL HYSTERECTOMY    . BREAST RECONSTRUCTION WITH PLACEMENT OF TISSUE EXPANDER AND FLEX HD (ACELLULAR HYDRATED DERMIS) Bilateral 05/19/2016   Procedure: BREAST RECONSTRUCTION WITH PLACEMENT OF TISSUE EXPANDER AND ALLODERM PLACEMENT;  Surgeon: Irene Limbo, MD;  Location: [facility];  Service: Plastics;  Laterality: Bilateral;  . CESAREAN SECTION    . CHOLECYSTECTOMY    . HEMORRHOID SURGERY    . LAPAROSCOPIC BILATERAL SALPINGECTOMY N/A 10/26/2012   Procedure: operative laparoscopy with lysis of adhesions;  Surgeon: Thornell Sartorius, MD;  Location: [facility];  Service: Gynecology;  Laterality: N/A;  . MASTECTOMY WITH RADIOACTIVE SEED GUIDED EXCISION AND AXILLARY  SENTINEL LYMPH NODE BIOPSY Bilateral 05/19/2016   Procedure: RIGHT SKIN SPARING MASTECTOMY WITH RIGHT RADIOACTIVE SEED TARGETED DISSECTION AND RIGHT SENTINEL LYMPH NODE BIOPSY, LEFT PROPHYLACTIC SKIN SPARING MASTECTOMY;  Surgeon: Rolm Bookbinder, MD;  Location: [facility];  Service: General;  Laterality: Bilateral;  . TUBAL LIGATION    . urologic surgery for ureteropelvic junction obstruction      There were no vitals filed for this visit.      Subjective Assessment - 06/18/16 1106    Subjective I got my expanders filled this week so I feel really tight. I got tattooed yesterday for radiation. It was hard to keep my arms up for the long time for simulation.   Patient Stated Goals to be pain free, be able to sleep good at night, full range of motion of arms   Currently in Pain? Yes   Pain Score 4    Pain Location Chest   Pain Orientation Right;Left   Pain Descriptors / Indicators Pressure   Pain Type Surgical pain   Pain Frequency Constant            [facility] PT Assessment - 06/18/16 0001      AROM   Right Shoulder Flexion 156 Degrees   Right Shoulder ABduction 156 Degrees   Left Shoulder Flexion 158 Degrees   Left Shoulder ABduction 151 Degrees  [facility] Adult PT Treatment/Exercise - 06/18/16 0001      Shoulder Exercises: Supine   Other Supine Exercises Supine cane flexion and abduction x5 each with PT verbal cues for technique and safety   Other Supine Exercises Supine scapular retraction x10     Shoulder Exercises: Standing   Other Standing Exercises Finger ladder x5 flexion and x5 abduction to pt tolerance; tactile cues to avoid shoulder elevation     Shoulder Exercises: Pulleys   Flexion 2 minutes   Flexion Limitations With PT tactile cues for technique   ABduction 2 minutes   ABduction Limitations PT verbal cues for proper technique     Shoulder Exercises: Therapy Ball   Flexion 10 reps   ABduction 5 reps  Each side      Manual Therapy   Manual Therapy Passive ROM;Neural Stretch   Manual therapy comments Also made foam pieces in stockinette for lateral chest to place between breast and bra; applied foam and she reported it felt good.   Passive ROM PROM bilateral shoulders supine all planes to pt tolerance with most time spent on right shoulder as that is more limited from sentinel node biopsy.   Neural Stretch Neural stretch ot right upper extremity in supine to pt tolerance                PT Education - 06/18/16 1138    Education provided Yes   Education Details Supine scapular retraction   Person(s) Educated Patient   Methods Explanation;Demonstration;Handout   Comprehension Returned demonstration;Verbalized understanding              [facility] Goals - 04/01/16 1559      Patient will be able to verbalize understanding of pertinent lymphedema risk reduction practices relevant to her diagnosis specifically related to skin care.   Time 1   Period Days   Status Achieved     Patient will be able to return demonstrate and/or verbalize understanding of the post-op home exercise program related to regaining shoulder range of motion.   Time 1   Period Days   Status Achieved     Patient will be able to verbalize understanding of the importance of attending the postoperative After Breast Cancer Class for further lymphedema risk reduction education and therapeutic exercise.   Time 1   Period Days   Status Achieved          Long Term Clinic Goals - 06/08/16 1508      CC Long Term Goal  #1   Title Pt will demonstrate 170 degrees of right shoulder flexion to allow her to reach items overhead   Baseline 139   Time 4   Period Weeks   Status New     CC Long Term Goal  #2   Title Pt will demonstrate 160 degrees of bilateral shoulder abduction ROM to allow her to reach out to her sides   Baseline R 105, L 116   Time 4   Period Weeks   Status New     CC Long Term Goal  #3    Title Pt will be able to independently verbalize lymphedema risk reduction practices    Time 4   Period Weeks   Status New     CC Long Term Goal  #4   Title Pt will be independent in a home exercise program for continued strengthening and stretching   Time 4   Period Weeks   Status New     CC Long  Term Goal  #5   Title Pt will report a 50% improvement in pain in right flank to allow improved comfort   Time 4   Period Weeks   Status New     CC Long Term Goal  #6   Title Pt to report a 50% improvement in bilateral trunk edema to allow improved comfort   Time 4   Period Weeks   Status New     Additional Goals   Additional Goals Yes            Plan - 06/18/16 1139    Clinical Impression Statement Patient continues to do well with ROM and significantly improved with cane mobility exercises. She reported increased ROM after manual therapy. She is very pleased with her progress. Little to no scapular compensation noted with shoulder flexion activities.   Rehab Potential Good   Clinical Impairments Affecting Rehab Potential Patient is beginning radiation   PT Frequency 2x / week   PT Duration 4 weeks   PT Treatment/Interventions ADLs/Self Care Home Management;Therapeutic exercise;Therapeutic activities;Patient/family education;Manual techniques;Manual lymph drainage;Compression bandaging;Scar mobilization;Passive range of motion;Taping   PT Next Visit Plan Continue ROM exercises with focus on manual therapy.   PT Home Exercise Plan Supine scapular retraction   Consulted and Agree with Plan of Care Patient      Patient will benefit from skilled therapeutic intervention in order to improve the following deficits and impairments:  Postural dysfunction, Decreased knowledge of precautions, Pain, Impaired UE functional use, Decreased range of motion, Increased muscle spasms, Decreased scar mobility, Decreased activity tolerance  Visit Diagnosis: Stiffness of right shoulder, not  elsewhere classified  Acute pain of right shoulder  Stiffness of left shoulder, not elsewhere classified  Acute pain of left shoulder  Muscle weakness (generalized)  Abnormal posture     Problem List Patient Active Problem List   Diagnosis Date Noted  . Breast cancer, right ([facility]) 05/19/2016  . Malignant neoplasm of overlapping sites of right breast in female, estrogen receptor positive ([place] [place]) 03/31/2016  . 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 Hydrosalpinx 10/25/2012  . Sciatica of right side 03/31/2012  . Routine general medical examination at a health care facility 08/27/2010  . SPONTANEOUS ECCHYMOSES 01/03/2009  . ANXIETY DISORDER 07/04/2007  . HYPOTHYROIDISM 06/23/2007  . HEMORRHOIDS 06/23/2007  . ESOPHAGEAL STRICTURE 06/23/2007  . GERD 06/23/2007  . DEGENERATIVE JOINT DISEASE 06/23/2007  . ARTHRITIS 06/23/2007  . SLEEP APNEA 06/23/2007   Annia Friendly, PT 06/18/16 12:00 PM  [place], [place], [address] Phone: [phone]   Fax:  [phone]  Name: [name] MRN: [id number] Date of Birth: [birth year]

## 2016-06-18 NOTE — Progress Notes (Signed)
  Radiation Oncology         [phone]) [phone] ________________________________  Name: SAYAKA HOEPPNER MRN: [id number]  Date: 06/17/2016  DOB: [birth year]  Optical Surface Tracking Plan:  Since intensity modulated radiotherapy (IMRT) and 3D conformal radiation treatment methods are predicated on accurate and precise positioning for treatment, intrafraction motion monitoring is medically necessary to ensure accurate and safe treatment delivery.  The ability to quantify intrafraction motion without excessive ionizing radiation dose can only be performed with optical surface tracking. Accordingly, surface imaging offers the opportunity to obtain 3D measurements of patient position throughout IMRT and 3D treatments without excessive radiation exposure.  I am ordering optical surface tracking for this patient's upcoming course of radiotherapy. ________________________________  Kyung Rudd, MD 06/18/2016 10:26 AM    Reference:   Particia Jasper, et al. Surface imaging-based analysis of intrafraction motion for breast radiotherapy patients.Journal of [place], n. 6, nov. 2014. ISSN [id number].   Available at: <http://www.jacmp.org/index.php/jacmp/article/view/4957>.

## 2016-06-18 NOTE — Patient Instructions (Signed)
Scapular Retraction (Standing)    Lay on your back. With arms at sides, pinch shoulder blades together. Repeat _10___ times per set. Do _1___ sets per session. Do __1__ sessions per day.  http://orth.exer.us/945   Copyright  VHI. All rights reserved.

## 2016-06-22 ENCOUNTER — Ambulatory Visit: Payer: BLUE CROSS/BLUE SHIELD | Admitting: Physical Therapy

## 2016-06-24 ENCOUNTER — Ambulatory Visit
Admission: RE | Admit: 2016-06-24 | Discharge: 2016-06-24 | Disposition: A | Discharge status: Home / Self Care | Payer: BLUE CROSS/BLUE SHIELD | Source: Ambulatory Visit | Attending: Radiation Oncology | Admitting: Radiation Oncology

## 2016-06-24 DIAGNOSIS — Z79899 Other long term (current) drug therapy: Secondary | ICD-10-CM | POA: Diagnosis not present

## 2016-06-24 DIAGNOSIS — C50811 Malignant neoplasm of overlapping sites of right female breast: Secondary | ICD-10-CM

## 2016-06-24 DIAGNOSIS — Z833 Family history of diabetes mellitus: Secondary | ICD-10-CM | POA: Diagnosis not present

## 2016-06-24 DIAGNOSIS — Z888 Allergy status to other drugs, medicaments and biological substances status: Secondary | ICD-10-CM | POA: Diagnosis not present

## 2016-06-24 DIAGNOSIS — Z9013 Acquired absence of bilateral breasts and nipples: Secondary | ICD-10-CM | POA: Diagnosis not present

## 2016-06-24 DIAGNOSIS — Z170 Estrogen receptor positive status [ER+]: Principal | ICD-10-CM

## 2016-06-24 DIAGNOSIS — E039 Hypothyroidism, unspecified: Secondary | ICD-10-CM | POA: Diagnosis not present

## 2016-06-24 DIAGNOSIS — C50411 Malignant neoplasm of upper-outer quadrant of right female breast: Secondary | ICD-10-CM | POA: Diagnosis not present

## 2016-06-24 DIAGNOSIS — Z87891 Personal history of nicotine dependence: Secondary | ICD-10-CM | POA: Diagnosis not present

## 2016-06-24 DIAGNOSIS — F419 Anxiety disorder, unspecified: Secondary | ICD-10-CM | POA: Diagnosis not present

## 2016-06-24 DIAGNOSIS — Z8249 Family history of ischemic heart disease and other diseases of the circulatory system: Secondary | ICD-10-CM | POA: Diagnosis not present

## 2016-06-24 DIAGNOSIS — C773 Secondary and unspecified malignant neoplasm of axilla and upper limb lymph nodes: Secondary | ICD-10-CM | POA: Diagnosis not present

## 2016-06-24 DIAGNOSIS — Z800 Family history of malignant neoplasm of digestive organs: Secondary | ICD-10-CM | POA: Diagnosis not present

## 2016-06-24 DIAGNOSIS — Z825 Family history of asthma and other chronic lower respiratory diseases: Secondary | ICD-10-CM | POA: Diagnosis not present

## 2016-06-24 DIAGNOSIS — Z510 Encounter for antineoplastic radiation therapy: Secondary | ICD-10-CM | POA: Diagnosis not present

## 2016-06-24 DIAGNOSIS — K219 Gastro-esophageal reflux disease without esophagitis: Secondary | ICD-10-CM | POA: Diagnosis not present

## 2016-06-24 DIAGNOSIS — F329 Major depressive disorder, single episode, unspecified: Secondary | ICD-10-CM | POA: Diagnosis not present

## 2016-06-24 MED ORDER — ALRA NON-METALLIC DEODORANT (RAD-ONC)
1.0000 | Freq: Once | TOPICAL | Status: AC
Start: 2016-06-24 — End: 2016-06-24
  Administered 2016-06-24: 1 via TOPICAL

## 2016-06-24 MED ORDER — RADIAPLEXRX EX GEL
Freq: Once | CUTANEOUS | Status: AC
  Administered 2016-06-24: 13:00:00 via TOPICAL

## 2016-06-24 NOTE — Progress Notes (Signed)
Pt education done, breast, my business card, radiaplex gel, alra deodorant,Radiation therapy and you book,,discussed skin iritatin,fatigue,pain, swelling ,sharp pains of breast, increase protein in diet,stay hydrated,drink plenty water, electric shaver id shaving, luke warm showers,no rubbing,scrubbing or scratching breast area, no underwire bras, dove soap unscented preferred, use radaiplex after rad tx and bedtime daily, alra after rad tx and prn, verbal understanding,teach back 12:17 PM

## 2016-06-25 ENCOUNTER — Ambulatory Visit
Admission: RE | Admit: 2016-06-25 | Discharge: 2016-06-25 | Disposition: A | Discharge status: Home / Self Care | Payer: BLUE CROSS/BLUE SHIELD | Source: Ambulatory Visit | Attending: Radiation Oncology | Admitting: Radiation Oncology

## 2016-06-25 ENCOUNTER — Encounter: Payer: Self-pay | Admitting: Physical Therapy

## 2016-06-25 ENCOUNTER — Ambulatory Visit: Payer: BLUE CROSS/BLUE SHIELD | Attending: Radiation Oncology | Admitting: Physical Therapy

## 2016-06-25 DIAGNOSIS — Z170 Estrogen receptor positive status [ER+]: Secondary | ICD-10-CM | POA: Diagnosis not present

## 2016-06-25 DIAGNOSIS — Z833 Family history of diabetes mellitus: Secondary | ICD-10-CM | POA: Diagnosis not present

## 2016-06-25 DIAGNOSIS — E039 Hypothyroidism, unspecified: Secondary | ICD-10-CM | POA: Diagnosis not present

## 2016-06-25 DIAGNOSIS — C50411 Malignant neoplasm of upper-outer quadrant of right female breast: Secondary | ICD-10-CM | POA: Diagnosis not present

## 2016-06-25 DIAGNOSIS — Z8249 Family history of ischemic heart disease and other diseases of the circulatory system: Secondary | ICD-10-CM | POA: Diagnosis not present

## 2016-06-25 DIAGNOSIS — M25512 Pain in left shoulder: Secondary | ICD-10-CM | POA: Diagnosis not present

## 2016-06-25 DIAGNOSIS — R293 Abnormal posture: Secondary | ICD-10-CM | POA: Diagnosis not present

## 2016-06-25 DIAGNOSIS — M25511 Pain in right shoulder: Secondary | ICD-10-CM | POA: Diagnosis not present

## 2016-06-25 DIAGNOSIS — C50811 Malignant neoplasm of overlapping sites of right female breast: Secondary | ICD-10-CM | POA: Diagnosis not present

## 2016-06-25 DIAGNOSIS — M25612 Stiffness of left shoulder, not elsewhere classified: Secondary | ICD-10-CM | POA: Diagnosis not present

## 2016-06-25 DIAGNOSIS — Z510 Encounter for antineoplastic radiation therapy: Secondary | ICD-10-CM | POA: Diagnosis not present

## 2016-06-25 DIAGNOSIS — M6281 Muscle weakness (generalized): Secondary | ICD-10-CM | POA: Diagnosis not present

## 2016-06-25 DIAGNOSIS — Z79899 Other long term (current) drug therapy: Secondary | ICD-10-CM | POA: Diagnosis not present

## 2016-06-25 DIAGNOSIS — Z888 Allergy status to other drugs, medicaments and biological substances status: Secondary | ICD-10-CM | POA: Diagnosis not present

## 2016-06-25 DIAGNOSIS — F329 Major depressive disorder, single episode, unspecified: Secondary | ICD-10-CM | POA: Diagnosis not present

## 2016-06-25 DIAGNOSIS — F419 Anxiety disorder, unspecified: Secondary | ICD-10-CM | POA: Diagnosis not present

## 2016-06-25 DIAGNOSIS — M25611 Stiffness of right shoulder, not elsewhere classified: Secondary | ICD-10-CM | POA: Insufficient documentation

## 2016-06-25 DIAGNOSIS — K219 Gastro-esophageal reflux disease without esophagitis: Secondary | ICD-10-CM | POA: Diagnosis not present

## 2016-06-25 DIAGNOSIS — Z9013 Acquired absence of bilateral breasts and nipples: Secondary | ICD-10-CM | POA: Diagnosis not present

## 2016-06-25 DIAGNOSIS — C773 Secondary and unspecified malignant neoplasm of axilla and upper limb lymph nodes: Secondary | ICD-10-CM | POA: Diagnosis not present

## 2016-06-25 DIAGNOSIS — Z825 Family history of asthma and other chronic lower respiratory diseases: Secondary | ICD-10-CM | POA: Diagnosis not present

## 2016-06-25 DIAGNOSIS — Z87891 Personal history of nicotine dependence: Secondary | ICD-10-CM | POA: Diagnosis not present

## 2016-06-25 DIAGNOSIS — Z800 Family history of malignant neoplasm of digestive organs: Secondary | ICD-10-CM | POA: Diagnosis not present

## 2016-06-25 NOTE — Therapy (Signed)
[place], [place], [address] Phone: [phone]   Fax:  [phone]  Physical Therapy Treatment  Patient Details  Name: [name] MRN: [id number] Date of Birth: 01-17-1961 Referring Provider: Hayden Pedro  Encounter Date: 06/25/2016      PT End of Session - 06/25/16 1159    Visit Number 4   Number of Visits 9   Date for PT Re-Evaluation 07/06/16   PT Start Time 1100   PT Stop Time 1146   PT Time Calculation (min) 46 min   Activity Tolerance Patient tolerated treatment well   Behavior During Therapy [facility] for tasks assessed/performed      Past Medical History:  Diagnosis Date  . Anxiety   . Anxiety disorder   . Asthma    h/o asthma as a child  . Cancer ([place]) 04/2016   right breast cancer  . Cholelithiasis   . Depression   . Diverticulosis   . DJD (degenerative joint disease)   . Epigastric abdominal pain   . Esophageal stricture   . Family hx of colon cancer   . 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 Ganglion cyst   . GERD (gastroesophageal reflux disease)   . Hemorrhoid   . Hypothyroidism   . PONV (postoperative nausea and vomiting)     Past Surgical History:  Procedure Laterality Date  . ABDOMINAL HYSTERECTOMY    . BREAST RECONSTRUCTION WITH PLACEMENT OF TISSUE EXPANDER AND FLEX HD (ACELLULAR HYDRATED DERMIS) Bilateral 05/19/2016   Procedure: BREAST RECONSTRUCTION WITH PLACEMENT OF TISSUE EXPANDER AND ALLODERM PLACEMENT;  Surgeon: Irene Limbo, MD;  Location: [place];  Service: Plastics;  Laterality: Bilateral;  . CESAREAN SECTION    . CHOLECYSTECTOMY    . HEMORRHOID SURGERY    . LAPAROSCOPIC BILATERAL SALPINGECTOMY N/A 10/26/2012   Procedure: operative laparoscopy with lysis of adhesions;  Surgeon: Thornell Sartorius, MD;  Location: [facility];  Service: Gynecology;  Laterality: N/A;  . MASTECTOMY WITH RADIOACTIVE SEED GUIDED EXCISION AND AXILLARY  SENTINEL LYMPH NODE BIOPSY Bilateral 05/19/2016   Procedure: RIGHT SKIN SPARING MASTECTOMY WITH RIGHT RADIOACTIVE SEED TARGETED DISSECTION AND RIGHT SENTINEL LYMPH NODE BIOPSY, LEFT PROPHYLACTIC SKIN SPARING MASTECTOMY;  Surgeon: Rolm Bookbinder, MD;  Location: [facility];  Service: General;  Laterality: Bilateral;  . TUBAL LIGATION    . urologic surgery for ureteropelvic junction obstruction      There were no vitals filed for this visit.      Subjective Assessment - 06/25/16 1103    Subjective I canceled Monday because I felt like I had a stomach bug.   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 It measures 3.6 cm with an extension to 5 cm and is located in overlapping quadrants: upper outer and lower outer right breast.  It is ER/PR positive and HER2 negative with a Ki67 of 40%.  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 No/denies  [facility] PT Assessment - 06/25/16 0001      AROM   Right Shoulder Flexion 158 Degrees   Right Shoulder ABduction 162 Degrees   Left Shoulder Flexion 168 Degrees   Left Shoulder ABduction 164 Degrees                     [facility] Adult PT Treatment/Exercise - 06/25/16 0001      Shoulder Exercises: Standing   Other Standing Exercises UE Ranger standing bilateral shoulders moving in windshield wiper direction     Shoulder Exercises: Pulleys   Flexion 2 minutes   Flexion Limitations Verbal cues for technique   ABduction 2 minutes    ABduction Limitations PT verbal cues for proper technique     Shoulder Exercises: Therapy Ball   Flexion 10 reps   ABduction 10 reps     Shoulder Exercises: Stretch   Corner Stretch 3 reps;10 seconds     Manual Therapy   Manual Therapy Passive ROM;Neural Stretch   Passive ROM PROM bilateral shoulders supine all planes to pt tolerance with most time spent on right shoulder as that is more limited from sentinel node biopsy.   Neural Stretch Neural stretch ot right upper extremity in supine to pt tolerance                      Breast Clinic Goals - 04/01/16 1559      Patient will be able to verbalize understanding of pertinent lymphedema risk reduction practices relevant to her diagnosis specifically related to skin care.   Time 1   Period Days   Status Achieved     Patient will be able to return demonstrate and/or verbalize understanding of the post-op home exercise program related to regaining shoulder range of motion.   Time 1   Period Days   Status Achieved     Patient will be able to verbalize understanding of the importance of attending the postoperative After Breast Cancer Class for further lymphedema risk reduction education and therapeutic exercise.   Time 1   Period Days   Status Achieved          Long Term Clinic Goals - 06/08/16 1508      CC Long Term Goal  #1   Title Pt will demonstrate 170 degrees of right shoulder flexion to allow her to reach items overhead   Baseline 139   Time 4   Period Weeks   Status New     CC Long Term Goal  #2   Title Pt will demonstrate 160 degrees of bilateral shoulder abduction ROM to allow her to reach out to her sides   Baseline R 105, L 116   Time 4   Period Weeks   Status New     CC Long Term Goal  #3   Title Pt will be able to independently verbalize lymphedema risk reduction practices    Time 4   Period Weeks   Status New     CC Long Term Goal  #4   Title Pt will be independent in a home exercise  program for continued strengthening and stretching   Time 4   Period Weeks   Status New     CC Long Term Goal  #5   Title Pt will report a 50% improvement in pain in right flank to allow improved comfort   Time 4   Period Weeks   Status New     CC Long Term  Goal  #6   Title Pt to report a 50% improvement in bilateral trunk edema to allow improved comfort   Time 4   Period Weeks   Status New     Additional Goals   Additional Goals Yes            Plan - 06/25/16 1200    Clinical Impression Statement Patient continues to do well and ROM is at baseline in bilateral shoulders. She will benefit from 1 more visit to ensure shoulder ROM is not negatively impacted by radiation as she begins that today. She will also benefit from learning the [facility] exercise packet.   Rehab Potential Good   Clinical Impairments Affecting Rehab Potential Patient is beginning radiation   PT Frequency 2x / week   PT Duration 4 weeks   PT Treatment/Interventions ADLs/Self Care Home Management;Therapeutic exercise;Therapeutic activities;Patient/family education;Manual techniques;Manual lymph drainage;Compression bandaging;Scar mobilization;Passive range of motion;Taping   PT Next Visit Plan Teach ABC exercise packet; D/C if goals met.   Consulted and Agree with Plan of Care Patient      Patient will benefit from skilled therapeutic intervention in order to improve the following deficits and impairments:  Postural dysfunction, Decreased knowledge of precautions, Pain, Impaired UE functional use, Decreased range of motion, Increased muscle spasms, Decreased scar mobility, Decreased activity tolerance  Visit Diagnosis: Stiffness of right shoulder, not elsewhere classified  Acute pain of right shoulder  Stiffness of left shoulder, not elsewhere classified  Acute pain of left shoulder  Abnormal posture     Problem List Patient Active Problem List   Diagnosis Date Noted  . Breast cancer, right ([place])  05/19/2016  . Malignant neoplasm of overlapping sites of right breast in female, estrogen receptor positive ([place]) 03/31/2016  . 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 Hydrosalpinx 10/25/2012  . Sciatica of right side 03/31/2012  . Routine general medical examination at a health care facility 08/27/2010  . SPONTANEOUS ECCHYMOSES 01/03/2009  . ANXIETY DISORDER 07/04/2007  . HYPOTHYROIDISM 06/23/2007  . HEMORRHOIDS 06/23/2007  . ESOPHAGEAL STRICTURE 06/23/2007  . GERD 06/23/2007  . DEGENERATIVE JOINT DISEASE 06/23/2007  . ARTHRITIS 06/23/2007  . SLEEP APNEA 06/23/2007    Annia Friendly, PT 06/25/16 12:02 PM  [place], [place], [address] Phone: [phone]   Fax:  [phone]  Name: ALANA DAYTON MRN: [id number] Date of Birth: [birth year]

## 2016-06-26 ENCOUNTER — Encounter: Payer: Self-pay | Admitting: Radiation Oncology

## 2016-06-26 ENCOUNTER — Ambulatory Visit
Admission: RE | Admit: 2016-06-26 | Discharge: 2016-06-26 | Disposition: A | Discharge status: Home / Self Care | Payer: BLUE CROSS/BLUE SHIELD | Source: Ambulatory Visit | Attending: Radiation Oncology | Admitting: Radiation Oncology

## 2016-06-26 DIAGNOSIS — E039 Hypothyroidism, unspecified: Secondary | ICD-10-CM | POA: Diagnosis not present

## 2016-06-26 DIAGNOSIS — Z825 Family history of asthma and other chronic lower respiratory diseases: Secondary | ICD-10-CM | POA: Diagnosis not present

## 2016-06-26 DIAGNOSIS — F419 Anxiety disorder, unspecified: Secondary | ICD-10-CM | POA: Diagnosis not present

## 2016-06-26 DIAGNOSIS — Z800 Family history of malignant neoplasm of digestive organs: Secondary | ICD-10-CM | POA: Diagnosis not present

## 2016-06-26 DIAGNOSIS — C50811 Malignant neoplasm of overlapping sites of right female breast: Secondary | ICD-10-CM | POA: Diagnosis not present

## 2016-06-26 DIAGNOSIS — F329 Major depressive disorder, single episode, unspecified: Secondary | ICD-10-CM | POA: Diagnosis not present

## 2016-06-26 DIAGNOSIS — Z87891 Personal history of nicotine dependence: Secondary | ICD-10-CM | POA: Diagnosis not present

## 2016-06-26 DIAGNOSIS — K219 Gastro-esophageal reflux disease without esophagitis: Secondary | ICD-10-CM | POA: Diagnosis not present

## 2016-06-26 DIAGNOSIS — Z170 Estrogen receptor positive status [ER+]: Secondary | ICD-10-CM | POA: Diagnosis not present

## 2016-06-26 DIAGNOSIS — Z833 Family history of diabetes mellitus: Secondary | ICD-10-CM | POA: Diagnosis not present

## 2016-06-26 DIAGNOSIS — Z8249 Family history of ischemic heart disease and other diseases of the circulatory system: Secondary | ICD-10-CM | POA: Diagnosis not present

## 2016-06-26 DIAGNOSIS — C773 Secondary and unspecified malignant neoplasm of axilla and upper limb lymph nodes: Secondary | ICD-10-CM | POA: Diagnosis not present

## 2016-06-26 DIAGNOSIS — Z9013 Acquired absence of bilateral breasts and nipples: Secondary | ICD-10-CM | POA: Diagnosis not present

## 2016-06-26 DIAGNOSIS — Z79899 Other long term (current) drug therapy: Secondary | ICD-10-CM | POA: Diagnosis not present

## 2016-06-26 DIAGNOSIS — C50411 Malignant neoplasm of upper-outer quadrant of right female breast: Secondary | ICD-10-CM | POA: Diagnosis not present

## 2016-06-26 DIAGNOSIS — Z888 Allergy status to other drugs, medicaments and biological substances status: Secondary | ICD-10-CM | POA: Diagnosis not present

## 2016-06-26 DIAGNOSIS — Z510 Encounter for antineoplastic radiation therapy: Secondary | ICD-10-CM | POA: Diagnosis not present

## 2016-06-26 NOTE — Progress Notes (Signed)
Paperwork ([employer]) received 5/4, given to nurse 5/7

## 2016-06-29 ENCOUNTER — Ambulatory Visit
Admission: RE | Admit: 2016-06-29 | Discharge: 2016-06-29 | Disposition: A | Discharge status: Home / Self Care | Payer: BLUE CROSS/BLUE SHIELD | Source: Ambulatory Visit | Attending: Radiation Oncology | Admitting: Radiation Oncology

## 2016-06-29 DIAGNOSIS — Z833 Family history of diabetes mellitus: Secondary | ICD-10-CM | POA: Diagnosis not present

## 2016-06-29 DIAGNOSIS — Z800 Family history of malignant neoplasm of digestive organs: Secondary | ICD-10-CM | POA: Diagnosis not present

## 2016-06-29 DIAGNOSIS — Z9013 Acquired absence of bilateral breasts and nipples: Secondary | ICD-10-CM | POA: Diagnosis not present

## 2016-06-29 DIAGNOSIS — C50411 Malignant neoplasm of upper-outer quadrant of right female breast: Secondary | ICD-10-CM | POA: Diagnosis not present

## 2016-06-29 DIAGNOSIS — Z87891 Personal history of nicotine dependence: Secondary | ICD-10-CM | POA: Diagnosis not present

## 2016-06-29 DIAGNOSIS — Z825 Family history of asthma and other chronic lower respiratory diseases: Secondary | ICD-10-CM | POA: Diagnosis not present

## 2016-06-29 DIAGNOSIS — F419 Anxiety disorder, unspecified: Secondary | ICD-10-CM | POA: Diagnosis not present

## 2016-06-29 DIAGNOSIS — Z170 Estrogen receptor positive status [ER+]: Secondary | ICD-10-CM | POA: Diagnosis not present

## 2016-06-29 DIAGNOSIS — K219 Gastro-esophageal reflux disease without esophagitis: Secondary | ICD-10-CM | POA: Diagnosis not present

## 2016-06-29 DIAGNOSIS — Z8249 Family history of ischemic heart disease and other diseases of the circulatory system: Secondary | ICD-10-CM | POA: Diagnosis not present

## 2016-06-29 DIAGNOSIS — C773 Secondary and unspecified malignant neoplasm of axilla and upper limb lymph nodes: Secondary | ICD-10-CM | POA: Diagnosis not present

## 2016-06-29 DIAGNOSIS — E039 Hypothyroidism, unspecified: Secondary | ICD-10-CM | POA: Diagnosis not present

## 2016-06-29 DIAGNOSIS — Z888 Allergy status to other drugs, medicaments and biological substances status: Secondary | ICD-10-CM | POA: Diagnosis not present

## 2016-06-29 DIAGNOSIS — F329 Major depressive disorder, single episode, unspecified: Secondary | ICD-10-CM | POA: Diagnosis not present

## 2016-06-29 DIAGNOSIS — C50811 Malignant neoplasm of overlapping sites of right female breast: Secondary | ICD-10-CM | POA: Diagnosis not present

## 2016-06-29 DIAGNOSIS — Z510 Encounter for antineoplastic radiation therapy: Secondary | ICD-10-CM | POA: Diagnosis not present

## 2016-06-29 DIAGNOSIS — Z79899 Other long term (current) drug therapy: Secondary | ICD-10-CM | POA: Diagnosis not present

## 2016-06-30 ENCOUNTER — Ambulatory Visit
Admission: RE | Admit: 2016-06-30 | Discharge: 2016-06-30 | Disposition: A | Discharge status: Home / Self Care | Payer: BLUE CROSS/BLUE SHIELD | Source: Ambulatory Visit | Attending: Radiation Oncology | Admitting: Radiation Oncology

## 2016-06-30 DIAGNOSIS — C773 Secondary and unspecified malignant neoplasm of axilla and upper limb lymph nodes: Secondary | ICD-10-CM | POA: Diagnosis not present

## 2016-06-30 DIAGNOSIS — Z825 Family history of asthma and other chronic lower respiratory diseases: Secondary | ICD-10-CM | POA: Diagnosis not present

## 2016-06-30 DIAGNOSIS — F419 Anxiety disorder, unspecified: Secondary | ICD-10-CM | POA: Diagnosis not present

## 2016-06-30 DIAGNOSIS — F329 Major depressive disorder, single episode, unspecified: Secondary | ICD-10-CM | POA: Diagnosis not present

## 2016-06-30 DIAGNOSIS — Z79899 Other long term (current) drug therapy: Secondary | ICD-10-CM | POA: Diagnosis not present

## 2016-06-30 DIAGNOSIS — Z170 Estrogen receptor positive status [ER+]: Secondary | ICD-10-CM | POA: Diagnosis not present

## 2016-06-30 DIAGNOSIS — Z800 Family history of malignant neoplasm of digestive organs: Secondary | ICD-10-CM | POA: Diagnosis not present

## 2016-06-30 DIAGNOSIS — Z833 Family history of diabetes mellitus: Secondary | ICD-10-CM | POA: Diagnosis not present

## 2016-06-30 DIAGNOSIS — Z87891 Personal history of nicotine dependence: Secondary | ICD-10-CM | POA: Diagnosis not present

## 2016-06-30 DIAGNOSIS — Z8249 Family history of ischemic heart disease and other diseases of the circulatory system: Secondary | ICD-10-CM | POA: Diagnosis not present

## 2016-06-30 DIAGNOSIS — E039 Hypothyroidism, unspecified: Secondary | ICD-10-CM | POA: Diagnosis not present

## 2016-06-30 DIAGNOSIS — C50811 Malignant neoplasm of overlapping sites of right female breast: Secondary | ICD-10-CM | POA: Diagnosis not present

## 2016-06-30 DIAGNOSIS — Z888 Allergy status to other drugs, medicaments and biological substances status: Secondary | ICD-10-CM | POA: Diagnosis not present

## 2016-06-30 DIAGNOSIS — Z510 Encounter for antineoplastic radiation therapy: Secondary | ICD-10-CM | POA: Diagnosis not present

## 2016-06-30 DIAGNOSIS — C50411 Malignant neoplasm of upper-outer quadrant of right female breast: Secondary | ICD-10-CM | POA: Diagnosis not present

## 2016-06-30 DIAGNOSIS — K219 Gastro-esophageal reflux disease without esophagitis: Secondary | ICD-10-CM | POA: Diagnosis not present

## 2016-06-30 DIAGNOSIS — Z9013 Acquired absence of bilateral breasts and nipples: Secondary | ICD-10-CM | POA: Diagnosis not present

## 2016-07-01 ENCOUNTER — Ambulatory Visit
Admission: RE | Admit: 2016-07-01 | Discharge: 2016-07-01 | Disposition: A | Discharge status: Home / Self Care | Payer: BLUE CROSS/BLUE SHIELD | Source: Ambulatory Visit | Attending: Radiation Oncology | Admitting: Radiation Oncology

## 2016-07-01 DIAGNOSIS — K219 Gastro-esophageal reflux disease without esophagitis: Secondary | ICD-10-CM | POA: Diagnosis not present

## 2016-07-01 DIAGNOSIS — Z888 Allergy status to other drugs, medicaments and biological substances status: Secondary | ICD-10-CM | POA: Diagnosis not present

## 2016-07-01 DIAGNOSIS — C773 Secondary and unspecified malignant neoplasm of axilla and upper limb lymph nodes: Secondary | ICD-10-CM | POA: Diagnosis not present

## 2016-07-01 DIAGNOSIS — Z833 Family history of diabetes mellitus: Secondary | ICD-10-CM | POA: Diagnosis not present

## 2016-07-01 DIAGNOSIS — Z8249 Family history of ischemic heart disease and other diseases of the circulatory system: Secondary | ICD-10-CM | POA: Diagnosis not present

## 2016-07-01 DIAGNOSIS — Z825 Family history of asthma and other chronic lower respiratory diseases: Secondary | ICD-10-CM | POA: Diagnosis not present

## 2016-07-01 DIAGNOSIS — Z800 Family history of malignant neoplasm of digestive organs: Secondary | ICD-10-CM | POA: Diagnosis not present

## 2016-07-01 DIAGNOSIS — C50811 Malignant neoplasm of overlapping sites of right female breast: Secondary | ICD-10-CM | POA: Diagnosis not present

## 2016-07-01 DIAGNOSIS — F329 Major depressive disorder, single episode, unspecified: Secondary | ICD-10-CM | POA: Diagnosis not present

## 2016-07-01 DIAGNOSIS — Z170 Estrogen receptor positive status [ER+]: Secondary | ICD-10-CM | POA: Diagnosis not present

## 2016-07-01 DIAGNOSIS — Z510 Encounter for antineoplastic radiation therapy: Secondary | ICD-10-CM | POA: Diagnosis not present

## 2016-07-01 DIAGNOSIS — Z9013 Acquired absence of bilateral breasts and nipples: Secondary | ICD-10-CM | POA: Diagnosis not present

## 2016-07-01 DIAGNOSIS — C50411 Malignant neoplasm of upper-outer quadrant of right female breast: Secondary | ICD-10-CM | POA: Diagnosis not present

## 2016-07-01 DIAGNOSIS — Z87891 Personal history of nicotine dependence: Secondary | ICD-10-CM | POA: Diagnosis not present

## 2016-07-01 DIAGNOSIS — Z79899 Other long term (current) drug therapy: Secondary | ICD-10-CM | POA: Diagnosis not present

## 2016-07-01 DIAGNOSIS — F419 Anxiety disorder, unspecified: Secondary | ICD-10-CM | POA: Diagnosis not present

## 2016-07-01 DIAGNOSIS — E039 Hypothyroidism, unspecified: Secondary | ICD-10-CM | POA: Diagnosis not present

## 2016-07-02 ENCOUNTER — Ambulatory Visit
Admission: RE | Admit: 2016-07-02 | Discharge: 2016-07-02 | Disposition: A | Discharge status: Home / Self Care | Payer: BLUE CROSS/BLUE SHIELD | Source: Ambulatory Visit | Attending: Radiation Oncology | Admitting: Radiation Oncology

## 2016-07-02 ENCOUNTER — Encounter: Payer: Self-pay | Admitting: Radiation Oncology

## 2016-07-02 ENCOUNTER — Telehealth: Payer: Self-pay | Admitting: Radiation Oncology

## 2016-07-02 DIAGNOSIS — C50411 Malignant neoplasm of upper-outer quadrant of right female breast: Secondary | ICD-10-CM | POA: Diagnosis not present

## 2016-07-02 DIAGNOSIS — F419 Anxiety disorder, unspecified: Secondary | ICD-10-CM | POA: Diagnosis not present

## 2016-07-02 DIAGNOSIS — E039 Hypothyroidism, unspecified: Secondary | ICD-10-CM | POA: Diagnosis not present

## 2016-07-02 DIAGNOSIS — Z9013 Acquired absence of bilateral breasts and nipples: Secondary | ICD-10-CM | POA: Diagnosis not present

## 2016-07-02 DIAGNOSIS — Z800 Family history of malignant neoplasm of digestive organs: Secondary | ICD-10-CM | POA: Diagnosis not present

## 2016-07-02 DIAGNOSIS — C773 Secondary and unspecified malignant neoplasm of axilla and upper limb lymph nodes: Secondary | ICD-10-CM | POA: Diagnosis not present

## 2016-07-02 DIAGNOSIS — K219 Gastro-esophageal reflux disease without esophagitis: Secondary | ICD-10-CM | POA: Diagnosis not present

## 2016-07-02 DIAGNOSIS — F329 Major depressive disorder, single episode, unspecified: Secondary | ICD-10-CM | POA: Diagnosis not present

## 2016-07-02 DIAGNOSIS — Z510 Encounter for antineoplastic radiation therapy: Secondary | ICD-10-CM | POA: Diagnosis not present

## 2016-07-02 DIAGNOSIS — Z170 Estrogen receptor positive status [ER+]: Secondary | ICD-10-CM | POA: Diagnosis not present

## 2016-07-02 DIAGNOSIS — Z8249 Family history of ischemic heart disease and other diseases of the circulatory system: Secondary | ICD-10-CM | POA: Diagnosis not present

## 2016-07-02 DIAGNOSIS — Z825 Family history of asthma and other chronic lower respiratory diseases: Secondary | ICD-10-CM | POA: Diagnosis not present

## 2016-07-02 DIAGNOSIS — Z79899 Other long term (current) drug therapy: Secondary | ICD-10-CM | POA: Diagnosis not present

## 2016-07-02 DIAGNOSIS — Z888 Allergy status to other drugs, medicaments and biological substances status: Secondary | ICD-10-CM | POA: Diagnosis not present

## 2016-07-02 DIAGNOSIS — Z87891 Personal history of nicotine dependence: Secondary | ICD-10-CM | POA: Diagnosis not present

## 2016-07-02 DIAGNOSIS — Z833 Family history of diabetes mellitus: Secondary | ICD-10-CM | POA: Diagnosis not present

## 2016-07-02 DIAGNOSIS — C50811 Malignant neoplasm of overlapping sites of right female breast: Secondary | ICD-10-CM | POA: Diagnosis not present

## 2016-07-02 NOTE — Telephone Encounter (Signed)
I called the patient to confirm her return to work plans. She sees plastics at the end of July and will make plans accordingly after this time.

## 2016-07-02 NOTE — Progress Notes (Signed)
Paperwork ([employer]) received, faxed to [phone], conf received, copy given to patient 07/02/16

## 2016-07-03 ENCOUNTER — Ambulatory Visit
Admission: RE | Admit: 2016-07-03 | Discharge: 2016-07-03 | Disposition: A | Discharge status: Home / Self Care | Payer: BLUE CROSS/BLUE SHIELD | Source: Ambulatory Visit | Attending: Radiation Oncology | Admitting: Radiation Oncology

## 2016-07-03 DIAGNOSIS — C773 Secondary and unspecified malignant neoplasm of axilla and upper limb lymph nodes: Secondary | ICD-10-CM | POA: Diagnosis not present

## 2016-07-03 DIAGNOSIS — Z800 Family history of malignant neoplasm of digestive organs: Secondary | ICD-10-CM | POA: Diagnosis not present

## 2016-07-03 DIAGNOSIS — C50811 Malignant neoplasm of overlapping sites of right female breast: Secondary | ICD-10-CM | POA: Diagnosis not present

## 2016-07-03 DIAGNOSIS — Z170 Estrogen receptor positive status [ER+]: Secondary | ICD-10-CM | POA: Diagnosis not present

## 2016-07-03 DIAGNOSIS — F419 Anxiety disorder, unspecified: Secondary | ICD-10-CM | POA: Diagnosis not present

## 2016-07-03 DIAGNOSIS — C50411 Malignant neoplasm of upper-outer quadrant of right female breast: Secondary | ICD-10-CM | POA: Diagnosis not present

## 2016-07-03 DIAGNOSIS — Z825 Family history of asthma and other chronic lower respiratory diseases: Secondary | ICD-10-CM | POA: Diagnosis not present

## 2016-07-03 DIAGNOSIS — Z87891 Personal history of nicotine dependence: Secondary | ICD-10-CM | POA: Diagnosis not present

## 2016-07-03 DIAGNOSIS — Z8249 Family history of ischemic heart disease and other diseases of the circulatory system: Secondary | ICD-10-CM | POA: Diagnosis not present

## 2016-07-03 DIAGNOSIS — Z9013 Acquired absence of bilateral breasts and nipples: Secondary | ICD-10-CM | POA: Diagnosis not present

## 2016-07-03 DIAGNOSIS — E039 Hypothyroidism, unspecified: Secondary | ICD-10-CM | POA: Diagnosis not present

## 2016-07-03 DIAGNOSIS — Z510 Encounter for antineoplastic radiation therapy: Secondary | ICD-10-CM | POA: Diagnosis not present

## 2016-07-03 DIAGNOSIS — K219 Gastro-esophageal reflux disease without esophagitis: Secondary | ICD-10-CM | POA: Diagnosis not present

## 2016-07-03 DIAGNOSIS — Z888 Allergy status to other drugs, medicaments and biological substances status: Secondary | ICD-10-CM | POA: Diagnosis not present

## 2016-07-03 DIAGNOSIS — F329 Major depressive disorder, single episode, unspecified: Secondary | ICD-10-CM | POA: Diagnosis not present

## 2016-07-03 DIAGNOSIS — Z833 Family history of diabetes mellitus: Secondary | ICD-10-CM | POA: Diagnosis not present

## 2016-07-03 DIAGNOSIS — Z79899 Other long term (current) drug therapy: Secondary | ICD-10-CM | POA: Diagnosis not present

## 2016-07-06 ENCOUNTER — Ambulatory Visit: Payer: BLUE CROSS/BLUE SHIELD

## 2016-07-06 ENCOUNTER — Ambulatory Visit
Admission: RE | Admit: 2016-07-06 | Discharge: 2016-07-06 | Disposition: A | Discharge status: Home / Self Care | Payer: BLUE CROSS/BLUE SHIELD | Source: Ambulatory Visit | Attending: Radiation Oncology | Admitting: Radiation Oncology

## 2016-07-06 ENCOUNTER — Telehealth: Payer: Self-pay | Admitting: Radiation Oncology

## 2016-07-06 DIAGNOSIS — M25512 Pain in left shoulder: Secondary | ICD-10-CM

## 2016-07-06 DIAGNOSIS — C50411 Malignant neoplasm of upper-outer quadrant of right female breast: Secondary | ICD-10-CM | POA: Diagnosis not present

## 2016-07-06 DIAGNOSIS — Z170 Estrogen receptor positive status [ER+]: Secondary | ICD-10-CM | POA: Diagnosis not present

## 2016-07-06 DIAGNOSIS — M25612 Stiffness of left shoulder, not elsewhere classified: Secondary | ICD-10-CM

## 2016-07-06 DIAGNOSIS — R293 Abnormal posture: Secondary | ICD-10-CM | POA: Diagnosis not present

## 2016-07-06 DIAGNOSIS — Z833 Family history of diabetes mellitus: Secondary | ICD-10-CM | POA: Diagnosis not present

## 2016-07-06 DIAGNOSIS — Z79899 Other long term (current) drug therapy: Secondary | ICD-10-CM | POA: Diagnosis not present

## 2016-07-06 DIAGNOSIS — M6281 Muscle weakness (generalized): Secondary | ICD-10-CM

## 2016-07-06 DIAGNOSIS — Z87891 Personal history of nicotine dependence: Secondary | ICD-10-CM | POA: Diagnosis not present

## 2016-07-06 DIAGNOSIS — Z800 Family history of malignant neoplasm of digestive organs: Secondary | ICD-10-CM | POA: Diagnosis not present

## 2016-07-06 DIAGNOSIS — F329 Major depressive disorder, single episode, unspecified: Secondary | ICD-10-CM | POA: Diagnosis not present

## 2016-07-06 DIAGNOSIS — E039 Hypothyroidism, unspecified: Secondary | ICD-10-CM | POA: Diagnosis not present

## 2016-07-06 DIAGNOSIS — M25511 Pain in right shoulder: Secondary | ICD-10-CM

## 2016-07-06 DIAGNOSIS — Z825 Family history of asthma and other chronic lower respiratory diseases: Secondary | ICD-10-CM | POA: Diagnosis not present

## 2016-07-06 DIAGNOSIS — Z888 Allergy status to other drugs, medicaments and biological substances status: Secondary | ICD-10-CM | POA: Diagnosis not present

## 2016-07-06 DIAGNOSIS — Z510 Encounter for antineoplastic radiation therapy: Secondary | ICD-10-CM | POA: Diagnosis not present

## 2016-07-06 DIAGNOSIS — Z8249 Family history of ischemic heart disease and other diseases of the circulatory system: Secondary | ICD-10-CM | POA: Diagnosis not present

## 2016-07-06 DIAGNOSIS — K219 Gastro-esophageal reflux disease without esophagitis: Secondary | ICD-10-CM | POA: Diagnosis not present

## 2016-07-06 DIAGNOSIS — Z9013 Acquired absence of bilateral breasts and nipples: Secondary | ICD-10-CM | POA: Diagnosis not present

## 2016-07-06 DIAGNOSIS — F419 Anxiety disorder, unspecified: Secondary | ICD-10-CM | POA: Diagnosis not present

## 2016-07-06 DIAGNOSIS — C50811 Malignant neoplasm of overlapping sites of right female breast: Secondary | ICD-10-CM | POA: Diagnosis not present

## 2016-07-06 DIAGNOSIS — M25611 Stiffness of right shoulder, not elsewhere classified: Secondary | ICD-10-CM

## 2016-07-06 DIAGNOSIS — C773 Secondary and unspecified malignant neoplasm of axilla and upper limb lymph nodes: Secondary | ICD-10-CM | POA: Diagnosis not present

## 2016-07-06 NOTE — Telephone Encounter (Signed)
Patient advised insurance company needed office notes to go with what was already submitted. Patient gave permission for notes to be submitted 07/06/16

## 2016-07-06 NOTE — Therapy (Signed)
[place], [place], [address] Phone: [phone]   Fax:  [phone]  Physical Therapy Treatment  Patient Details  Name: [name] MRN: [id number] Date of Birth: 03/21/1960 Referring Provider: Hayden Pedro  Encounter Date: 07/06/2016      PT End of Session - 07/06/16 1237    Visit Number 5   Number of Visits 9   Date for PT Re-Evaluation 07/06/16   PT Start Time 0933   PT Stop Time 1026   PT Time Calculation (min) 53 min   Activity Tolerance Patient tolerated treatment well   Behavior During Therapy [facility] for tasks assessed/performed      Past Medical History:  Diagnosis Date  . Anxiety   . Anxiety disorder   . Asthma    h/o asthma as a child  . Cancer ([place]) 04/2016   right breast cancer  . Cholelithiasis   . Depression   . Diverticulosis   . DJD (degenerative joint disease)   . Epigastric abdominal pain   . Esophageal stricture   . Family hx of colon cancer   . 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 Ganglion cyst   . GERD (gastroesophageal reflux disease)   . Hemorrhoid   . Hypothyroidism   . PONV (postoperative nausea and vomiting)     Past Surgical History:  Procedure Laterality Date  . ABDOMINAL HYSTERECTOMY    . BREAST RECONSTRUCTION WITH PLACEMENT OF TISSUE EXPANDER AND FLEX HD (ACELLULAR HYDRATED DERMIS) Bilateral 05/19/2016   Procedure: BREAST RECONSTRUCTION WITH PLACEMENT OF TISSUE EXPANDER AND ALLODERM PLACEMENT;  Surgeon: Irene Limbo, MD;  Location: [place];  Service: Plastics;  Laterality: Bilateral;  . CESAREAN SECTION    . CHOLECYSTECTOMY    . HEMORRHOID SURGERY    . LAPAROSCOPIC BILATERAL SALPINGECTOMY N/A 10/26/2012   Procedure: operative laparoscopy with lysis of adhesions;  Surgeon: Thornell Sartorius, MD;  Location: [facility];  Service: Gynecology;  Laterality: N/A;  . MASTECTOMY WITH RADIOACTIVE SEED GUIDED EXCISION AND AXILLARY  SENTINEL LYMPH NODE BIOPSY Bilateral 05/19/2016   Procedure: RIGHT SKIN SPARING MASTECTOMY WITH RIGHT RADIOACTIVE SEED TARGETED DISSECTION AND RIGHT SENTINEL LYMPH NODE BIOPSY, LEFT PROPHYLACTIC SKIN SPARING MASTECTOMY;  Surgeon: Rolm Bookbinder, MD;  Location: [facility];  Service: General;  Laterality: Bilateral;  . TUBAL LIGATION    . urologic surgery for ureteropelvic junction obstruction      There were no vitals filed for this visit.      Subjective Assessment - 07/06/16 0939    Subjective I started taking Vitamin D and need to start calcium but haven't started yet due to my bone density test. Overall I think I'm doing pretty good for going throuhg radiation, my ROM hasn't suffered at least. But I do feel like I am swollen in my armpits, mostly on the Rt and that is causing tenderness when I walk because now they are rubbing more. But I am doing well and feel ready to D/C after today.   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 It measures 3.6 cm with an extension to 5 cm and is located in overlapping quadrants: upper outer and lower outer right breast.  It is ER/PR positive and HER2 negative with a Ki67 of 40%.  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 No/denies                         [facility] Adult PT Treatment/Exercise - 07/06/16 0001      Shoulder Exercises: Standing   Other Standing Exercises Pt instructed in Strength ABC Program in its entirety doing all stretches 1 x for  15 second holds  and all strengthening exercises with no weights and 5x each. Pt able to return                PT Education - 07/06/16 1236    Education provided Yes   Education Details Strength ABC Program and lymphedema risk reductions   Person(s) Educated Patient   Methods Explanation;Demonstration;Handout;Verbal cues   Comprehension Verbalized understanding;Returned demonstration              [facility] Goals - 07/06/16 1021      CC Long Term Goal  #1   Title Pt will demonstrate 170 degrees of right shoulder flexion to allow her to reach items overhead   Baseline 139; 164 degrees-07/06/16   Status Partially Met     CC Long Term Goal  #2   Title Pt will demonstrate 160 degrees of bilateral shoulder abduction ROM to allow her to reach out to her sides   Baseline R 105, L 116; Rt 174 and Lt 176-07/06/16   Status Achieved     CC Long Term Goal  #3   Title Pt will be able to independently verbalize lymphedema risk reduction practices    Baseline Instructed pt in this today and issued handout-07/06/16   Status Achieved     CC Long Term Goal  #4   Title Pt will be independent in a home exercise program for continued strengthening and stretching   Status Achieved     CC Long Term Goal  #5   Title Pt will report a 50% improvement in pain in right flank to allow improved comfort   Baseline No improvement at this time due to currently undergoing radiation-07/06/16   Status Not Met     CC Long Term Goal  #6   Title Pt to report a 50% improvement in bilateral trunk edema to allow improved comfort   Baseline No improvement due to currently undergoing radiation-07/06/16   Status Not Met            Plan - 07/06/16 1238    Clinical Impression Statement Pts A/ROM is doing very well and has improved since her last visit here, meeting 1 of 2 ROM goals and partially meeting the other. She has not met, at this time, her pain reduction or swelling reduction goals though  attributes this strictly to currently undergoing radiation and this is causing her to have continued bil trunk edema and Rt flank pain. She was instructed in Strength ABC Program today and did very well with this. Pt is pleased with her overall progress and is ready to be D/C at this time.    Rehab Potential Good   Clinical Impairments Affecting Rehab Potential Patient is beginning radiation   PT Frequency 2x / week   PT Duration 4  weeks   PT Treatment/Interventions ADLs/Self Care Home Management;Therapeutic exercise;Therapeutic activities;Patient/family education;Manual techniques;Manual lymph drainage;Compression bandaging;Scar mobilization;Passive range of motion;Taping   PT Next Visit Plan D/C this visit.    Consulted and Agree with Plan of Care Patient      Patient will benefit from skilled therapeutic intervention in order to improve the following deficits and impairments:  Postural dysfunction, Decreased knowledge of precautions, Pain, Impaired UE functional use, Decreased range of motion, Increased muscle spasms, Decreased scar mobility, Decreased activity tolerance  Visit Diagnosis: Stiffness of right shoulder, not elsewhere classified  Acute pain of right shoulder  Stiffness of left shoulder, not elsewhere classified  Acute pain of left shoulder  Abnormal posture  Muscle weakness (generalized)     Problem List Patient Active Problem List   Diagnosis Date Noted  . Breast cancer, right ([place]) 05/19/2016  . Malignant neoplasm of overlapping sites of right breast in female, estrogen receptor positive ([place]) 03/31/2016  . 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 Hydrosalpinx 10/25/2012  . Sciatica of right side 03/31/2012  . Routine general medical examination at a health care facility 08/27/2010  . SPONTANEOUS ECCHYMOSES 01/03/2009  . ANXIETY DISORDER 07/04/2007  . HYPOTHYROIDISM 06/23/2007  . HEMORRHOIDS 06/23/2007  . ESOPHAGEAL STRICTURE 06/23/2007  . GERD  06/23/2007  . DEGENERATIVE JOINT DISEASE 06/23/2007  . ARTHRITIS 06/23/2007  . SLEEP APNEA 06/23/2007    Otelia Limes, PTA 07/06/2016, 12:49 PM  [place], [place], [address] Phone: [phone]   Fax:  [phone]  Name: MORGHAN KESTER MRN: [id number] Date of Birth: [birth year]  PHYSICAL THERAPY DISCHARGE SUMMARY  Visits from Start of Care: 5  Current functional level related to goals / functional outcomes: Patient doing very well and feels ready for discharge. The only goals not met were due to her currently undergoing radiation and having some pain related to that. She did not meet her shoulder abduction ROM goal but has achieved > 160 degrees which is at baseline and functional.   Remaining deficits: None   Education / Equipment: Lymphedema risk reduction education Plan: Patient agrees to discharge.  Patient goals were partially met. Patient is being discharged due to being pleased with the current functional level.  ?????    Annia Friendly, [place] 07/06/16 1:09 PM

## 2016-07-07 ENCOUNTER — Ambulatory Visit
Admission: RE | Admit: 2016-07-07 | Discharge: 2016-07-07 | Disposition: A | Discharge status: Home / Self Care | Payer: BLUE CROSS/BLUE SHIELD | Source: Ambulatory Visit | Attending: Radiation Oncology | Admitting: Radiation Oncology

## 2016-07-07 ENCOUNTER — Encounter: Payer: Self-pay | Admitting: Radiation Oncology

## 2016-07-07 DIAGNOSIS — Z8249 Family history of ischemic heart disease and other diseases of the circulatory system: Secondary | ICD-10-CM | POA: Diagnosis not present

## 2016-07-07 DIAGNOSIS — Z888 Allergy status to other drugs, medicaments and biological substances status: Secondary | ICD-10-CM | POA: Diagnosis not present

## 2016-07-07 DIAGNOSIS — Z87891 Personal history of nicotine dependence: Secondary | ICD-10-CM | POA: Diagnosis not present

## 2016-07-07 DIAGNOSIS — C50411 Malignant neoplasm of upper-outer quadrant of right female breast: Secondary | ICD-10-CM | POA: Diagnosis not present

## 2016-07-07 DIAGNOSIS — Z170 Estrogen receptor positive status [ER+]: Secondary | ICD-10-CM | POA: Diagnosis not present

## 2016-07-07 DIAGNOSIS — Z510 Encounter for antineoplastic radiation therapy: Secondary | ICD-10-CM | POA: Diagnosis not present

## 2016-07-07 DIAGNOSIS — F329 Major depressive disorder, single episode, unspecified: Secondary | ICD-10-CM | POA: Diagnosis not present

## 2016-07-07 DIAGNOSIS — K219 Gastro-esophageal reflux disease without esophagitis: Secondary | ICD-10-CM | POA: Diagnosis not present

## 2016-07-07 DIAGNOSIS — Z825 Family history of asthma and other chronic lower respiratory diseases: Secondary | ICD-10-CM | POA: Diagnosis not present

## 2016-07-07 DIAGNOSIS — E039 Hypothyroidism, unspecified: Secondary | ICD-10-CM | POA: Diagnosis not present

## 2016-07-07 DIAGNOSIS — C50811 Malignant neoplasm of overlapping sites of right female breast: Secondary | ICD-10-CM | POA: Diagnosis not present

## 2016-07-07 DIAGNOSIS — Z79899 Other long term (current) drug therapy: Secondary | ICD-10-CM | POA: Diagnosis not present

## 2016-07-07 DIAGNOSIS — Z833 Family history of diabetes mellitus: Secondary | ICD-10-CM | POA: Diagnosis not present

## 2016-07-07 DIAGNOSIS — Z800 Family history of malignant neoplasm of digestive organs: Secondary | ICD-10-CM | POA: Diagnosis not present

## 2016-07-07 DIAGNOSIS — Z9013 Acquired absence of bilateral breasts and nipples: Secondary | ICD-10-CM | POA: Diagnosis not present

## 2016-07-07 DIAGNOSIS — C773 Secondary and unspecified malignant neoplasm of axilla and upper limb lymph nodes: Secondary | ICD-10-CM | POA: Diagnosis not present

## 2016-07-07 DIAGNOSIS — F419 Anxiety disorder, unspecified: Secondary | ICD-10-CM | POA: Diagnosis not present

## 2016-07-07 NOTE — Progress Notes (Signed)
Consult note sent to [facility] @ [phone], confirmation received 07/07/16

## 2016-07-08 ENCOUNTER — Ambulatory Visit
Admission: RE | Admit: 2016-07-08 | Discharge: 2016-07-08 | Disposition: A | Discharge status: Home / Self Care | Payer: BLUE CROSS/BLUE SHIELD | Source: Ambulatory Visit | Attending: Radiation Oncology | Admitting: Radiation Oncology

## 2016-07-08 DIAGNOSIS — Z825 Family history of asthma and other chronic lower respiratory diseases: Secondary | ICD-10-CM | POA: Diagnosis not present

## 2016-07-08 DIAGNOSIS — F329 Major depressive disorder, single episode, unspecified: Secondary | ICD-10-CM | POA: Diagnosis not present

## 2016-07-08 DIAGNOSIS — F419 Anxiety disorder, unspecified: Secondary | ICD-10-CM | POA: Diagnosis not present

## 2016-07-08 DIAGNOSIS — C50411 Malignant neoplasm of upper-outer quadrant of right female breast: Secondary | ICD-10-CM | POA: Diagnosis not present

## 2016-07-08 DIAGNOSIS — Z510 Encounter for antineoplastic radiation therapy: Secondary | ICD-10-CM | POA: Diagnosis not present

## 2016-07-08 DIAGNOSIS — K219 Gastro-esophageal reflux disease without esophagitis: Secondary | ICD-10-CM | POA: Diagnosis not present

## 2016-07-08 DIAGNOSIS — E039 Hypothyroidism, unspecified: Secondary | ICD-10-CM | POA: Diagnosis not present

## 2016-07-08 DIAGNOSIS — Z888 Allergy status to other drugs, medicaments and biological substances status: Secondary | ICD-10-CM | POA: Diagnosis not present

## 2016-07-08 DIAGNOSIS — Z800 Family history of malignant neoplasm of digestive organs: Secondary | ICD-10-CM | POA: Diagnosis not present

## 2016-07-08 DIAGNOSIS — Z87891 Personal history of nicotine dependence: Secondary | ICD-10-CM | POA: Diagnosis not present

## 2016-07-08 DIAGNOSIS — Z170 Estrogen receptor positive status [ER+]: Secondary | ICD-10-CM | POA: Diagnosis not present

## 2016-07-08 DIAGNOSIS — Z8249 Family history of ischemic heart disease and other diseases of the circulatory system: Secondary | ICD-10-CM | POA: Diagnosis not present

## 2016-07-08 DIAGNOSIS — Z9013 Acquired absence of bilateral breasts and nipples: Secondary | ICD-10-CM | POA: Diagnosis not present

## 2016-07-08 DIAGNOSIS — Z79899 Other long term (current) drug therapy: Secondary | ICD-10-CM | POA: Diagnosis not present

## 2016-07-08 DIAGNOSIS — C50811 Malignant neoplasm of overlapping sites of right female breast: Secondary | ICD-10-CM | POA: Diagnosis not present

## 2016-07-08 DIAGNOSIS — C773 Secondary and unspecified malignant neoplasm of axilla and upper limb lymph nodes: Secondary | ICD-10-CM | POA: Diagnosis not present

## 2016-07-08 DIAGNOSIS — Z833 Family history of diabetes mellitus: Secondary | ICD-10-CM | POA: Diagnosis not present

## 2016-07-09 ENCOUNTER — Ambulatory Visit
Admission: RE | Admit: 2016-07-09 | Discharge: 2016-07-09 | Disposition: A | Discharge status: Home / Self Care | Payer: BLUE CROSS/BLUE SHIELD | Source: Ambulatory Visit | Attending: Radiation Oncology | Admitting: Radiation Oncology

## 2016-07-09 DIAGNOSIS — C773 Secondary and unspecified malignant neoplasm of axilla and upper limb lymph nodes: Secondary | ICD-10-CM | POA: Diagnosis not present

## 2016-07-09 DIAGNOSIS — Z833 Family history of diabetes mellitus: Secondary | ICD-10-CM | POA: Diagnosis not present

## 2016-07-09 DIAGNOSIS — Z9013 Acquired absence of bilateral breasts and nipples: Secondary | ICD-10-CM | POA: Diagnosis not present

## 2016-07-09 DIAGNOSIS — Z8249 Family history of ischemic heart disease and other diseases of the circulatory system: Secondary | ICD-10-CM | POA: Diagnosis not present

## 2016-07-09 DIAGNOSIS — Z800 Family history of malignant neoplasm of digestive organs: Secondary | ICD-10-CM | POA: Diagnosis not present

## 2016-07-09 DIAGNOSIS — F419 Anxiety disorder, unspecified: Secondary | ICD-10-CM | POA: Diagnosis not present

## 2016-07-09 DIAGNOSIS — Z79899 Other long term (current) drug therapy: Secondary | ICD-10-CM | POA: Diagnosis not present

## 2016-07-09 DIAGNOSIS — E039 Hypothyroidism, unspecified: Secondary | ICD-10-CM | POA: Diagnosis not present

## 2016-07-09 DIAGNOSIS — Z510 Encounter for antineoplastic radiation therapy: Secondary | ICD-10-CM | POA: Diagnosis not present

## 2016-07-09 DIAGNOSIS — Z888 Allergy status to other drugs, medicaments and biological substances status: Secondary | ICD-10-CM | POA: Diagnosis not present

## 2016-07-09 DIAGNOSIS — F329 Major depressive disorder, single episode, unspecified: Secondary | ICD-10-CM | POA: Diagnosis not present

## 2016-07-09 DIAGNOSIS — Z825 Family history of asthma and other chronic lower respiratory diseases: Secondary | ICD-10-CM | POA: Diagnosis not present

## 2016-07-09 DIAGNOSIS — Z170 Estrogen receptor positive status [ER+]: Secondary | ICD-10-CM | POA: Diagnosis not present

## 2016-07-09 DIAGNOSIS — C50811 Malignant neoplasm of overlapping sites of right female breast: Secondary | ICD-10-CM | POA: Diagnosis not present

## 2016-07-09 DIAGNOSIS — C50411 Malignant neoplasm of upper-outer quadrant of right female breast: Secondary | ICD-10-CM | POA: Diagnosis not present

## 2016-07-09 DIAGNOSIS — Z87891 Personal history of nicotine dependence: Secondary | ICD-10-CM | POA: Diagnosis not present

## 2016-07-09 DIAGNOSIS — K219 Gastro-esophageal reflux disease without esophagitis: Secondary | ICD-10-CM | POA: Diagnosis not present

## 2016-07-10 ENCOUNTER — Ambulatory Visit
Admission: RE | Admit: 2016-07-10 | Discharge: 2016-07-10 | Disposition: A | Discharge status: Home / Self Care | Payer: BLUE CROSS/BLUE SHIELD | Source: Ambulatory Visit | Attending: Radiation Oncology | Admitting: Radiation Oncology

## 2016-07-10 DIAGNOSIS — F329 Major depressive disorder, single episode, unspecified: Secondary | ICD-10-CM | POA: Diagnosis not present

## 2016-07-10 DIAGNOSIS — Z800 Family history of malignant neoplasm of digestive organs: Secondary | ICD-10-CM | POA: Diagnosis not present

## 2016-07-10 DIAGNOSIS — Z170 Estrogen receptor positive status [ER+]: Principal | ICD-10-CM

## 2016-07-10 DIAGNOSIS — E039 Hypothyroidism, unspecified: Secondary | ICD-10-CM | POA: Diagnosis not present

## 2016-07-10 DIAGNOSIS — C50811 Malignant neoplasm of overlapping sites of right female breast: Secondary | ICD-10-CM | POA: Diagnosis not present

## 2016-07-10 DIAGNOSIS — C50411 Malignant neoplasm of upper-outer quadrant of right female breast: Secondary | ICD-10-CM | POA: Diagnosis not present

## 2016-07-10 DIAGNOSIS — Z8249 Family history of ischemic heart disease and other diseases of the circulatory system: Secondary | ICD-10-CM | POA: Diagnosis not present

## 2016-07-10 DIAGNOSIS — Z888 Allergy status to other drugs, medicaments and biological substances status: Secondary | ICD-10-CM | POA: Diagnosis not present

## 2016-07-10 DIAGNOSIS — Z510 Encounter for antineoplastic radiation therapy: Secondary | ICD-10-CM | POA: Diagnosis not present

## 2016-07-10 DIAGNOSIS — K219 Gastro-esophageal reflux disease without esophagitis: Secondary | ICD-10-CM | POA: Diagnosis not present

## 2016-07-10 DIAGNOSIS — F419 Anxiety disorder, unspecified: Secondary | ICD-10-CM | POA: Diagnosis not present

## 2016-07-10 DIAGNOSIS — Z825 Family history of asthma and other chronic lower respiratory diseases: Secondary | ICD-10-CM | POA: Diagnosis not present

## 2016-07-10 DIAGNOSIS — Z79899 Other long term (current) drug therapy: Secondary | ICD-10-CM | POA: Diagnosis not present

## 2016-07-10 DIAGNOSIS — Z833 Family history of diabetes mellitus: Secondary | ICD-10-CM | POA: Diagnosis not present

## 2016-07-10 DIAGNOSIS — Z9013 Acquired absence of bilateral breasts and nipples: Secondary | ICD-10-CM | POA: Diagnosis not present

## 2016-07-10 DIAGNOSIS — Z87891 Personal history of nicotine dependence: Secondary | ICD-10-CM | POA: Diagnosis not present

## 2016-07-10 DIAGNOSIS — C773 Secondary and unspecified malignant neoplasm of axilla and upper limb lymph nodes: Secondary | ICD-10-CM | POA: Diagnosis not present

## 2016-07-10 MED ORDER — RADIAPLEXRX EX GEL
Freq: Once | CUTANEOUS | Status: AC
  Administered 2016-07-10: 14:00:00 via TOPICAL

## 2016-07-13 ENCOUNTER — Ambulatory Visit
Admission: RE | Admit: 2016-07-13 | Discharge: 2016-07-13 | Disposition: A | Discharge status: Home / Self Care | Payer: BLUE CROSS/BLUE SHIELD | Source: Ambulatory Visit | Attending: Radiation Oncology | Admitting: Radiation Oncology

## 2016-07-13 DIAGNOSIS — F419 Anxiety disorder, unspecified: Secondary | ICD-10-CM | POA: Diagnosis not present

## 2016-07-13 DIAGNOSIS — C50811 Malignant neoplasm of overlapping sites of right female breast: Secondary | ICD-10-CM | POA: Diagnosis not present

## 2016-07-13 DIAGNOSIS — Z79899 Other long term (current) drug therapy: Secondary | ICD-10-CM | POA: Diagnosis not present

## 2016-07-13 DIAGNOSIS — C50411 Malignant neoplasm of upper-outer quadrant of right female breast: Secondary | ICD-10-CM | POA: Diagnosis not present

## 2016-07-13 DIAGNOSIS — Z888 Allergy status to other drugs, medicaments and biological substances status: Secondary | ICD-10-CM | POA: Diagnosis not present

## 2016-07-13 DIAGNOSIS — Z800 Family history of malignant neoplasm of digestive organs: Secondary | ICD-10-CM | POA: Diagnosis not present

## 2016-07-13 DIAGNOSIS — K219 Gastro-esophageal reflux disease without esophagitis: Secondary | ICD-10-CM | POA: Diagnosis not present

## 2016-07-13 DIAGNOSIS — C773 Secondary and unspecified malignant neoplasm of axilla and upper limb lymph nodes: Secondary | ICD-10-CM | POA: Diagnosis not present

## 2016-07-13 DIAGNOSIS — Z833 Family history of diabetes mellitus: Secondary | ICD-10-CM | POA: Diagnosis not present

## 2016-07-13 DIAGNOSIS — E039 Hypothyroidism, unspecified: Secondary | ICD-10-CM | POA: Diagnosis not present

## 2016-07-13 DIAGNOSIS — Z510 Encounter for antineoplastic radiation therapy: Secondary | ICD-10-CM | POA: Diagnosis not present

## 2016-07-13 DIAGNOSIS — F329 Major depressive disorder, single episode, unspecified: Secondary | ICD-10-CM | POA: Diagnosis not present

## 2016-07-13 DIAGNOSIS — Z87891 Personal history of nicotine dependence: Secondary | ICD-10-CM | POA: Diagnosis not present

## 2016-07-13 DIAGNOSIS — Z8249 Family history of ischemic heart disease and other diseases of the circulatory system: Secondary | ICD-10-CM | POA: Diagnosis not present

## 2016-07-13 DIAGNOSIS — Z170 Estrogen receptor positive status [ER+]: Secondary | ICD-10-CM | POA: Diagnosis not present

## 2016-07-13 DIAGNOSIS — Z825 Family history of asthma and other chronic lower respiratory diseases: Secondary | ICD-10-CM | POA: Diagnosis not present

## 2016-07-13 DIAGNOSIS — Z9013 Acquired absence of bilateral breasts and nipples: Secondary | ICD-10-CM | POA: Diagnosis not present

## 2016-07-14 ENCOUNTER — Ambulatory Visit
Admission: RE | Admit: 2016-07-14 | Discharge: 2016-07-14 | Disposition: A | Discharge status: Home / Self Care | Payer: BLUE CROSS/BLUE SHIELD | Source: Ambulatory Visit | Attending: Radiation Oncology | Admitting: Radiation Oncology

## 2016-07-14 DIAGNOSIS — Z8249 Family history of ischemic heart disease and other diseases of the circulatory system: Secondary | ICD-10-CM | POA: Diagnosis not present

## 2016-07-14 DIAGNOSIS — Z833 Family history of diabetes mellitus: Secondary | ICD-10-CM | POA: Diagnosis not present

## 2016-07-14 DIAGNOSIS — Z170 Estrogen receptor positive status [ER+]: Secondary | ICD-10-CM | POA: Diagnosis not present

## 2016-07-14 DIAGNOSIS — Z79899 Other long term (current) drug therapy: Secondary | ICD-10-CM | POA: Diagnosis not present

## 2016-07-14 DIAGNOSIS — Z510 Encounter for antineoplastic radiation therapy: Secondary | ICD-10-CM | POA: Diagnosis not present

## 2016-07-14 DIAGNOSIS — Z9013 Acquired absence of bilateral breasts and nipples: Secondary | ICD-10-CM | POA: Diagnosis not present

## 2016-07-14 DIAGNOSIS — Z800 Family history of malignant neoplasm of digestive organs: Secondary | ICD-10-CM | POA: Diagnosis not present

## 2016-07-14 DIAGNOSIS — Z888 Allergy status to other drugs, medicaments and biological substances status: Secondary | ICD-10-CM | POA: Diagnosis not present

## 2016-07-14 DIAGNOSIS — E039 Hypothyroidism, unspecified: Secondary | ICD-10-CM | POA: Diagnosis not present

## 2016-07-14 DIAGNOSIS — F329 Major depressive disorder, single episode, unspecified: Secondary | ICD-10-CM | POA: Diagnosis not present

## 2016-07-14 DIAGNOSIS — C50811 Malignant neoplasm of overlapping sites of right female breast: Secondary | ICD-10-CM | POA: Diagnosis not present

## 2016-07-14 DIAGNOSIS — Z825 Family history of asthma and other chronic lower respiratory diseases: Secondary | ICD-10-CM | POA: Diagnosis not present

## 2016-07-14 DIAGNOSIS — Z87891 Personal history of nicotine dependence: Secondary | ICD-10-CM | POA: Diagnosis not present

## 2016-07-14 DIAGNOSIS — K219 Gastro-esophageal reflux disease without esophagitis: Secondary | ICD-10-CM | POA: Diagnosis not present

## 2016-07-14 DIAGNOSIS — F419 Anxiety disorder, unspecified: Secondary | ICD-10-CM | POA: Diagnosis not present

## 2016-07-14 DIAGNOSIS — C50411 Malignant neoplasm of upper-outer quadrant of right female breast: Secondary | ICD-10-CM | POA: Diagnosis not present

## 2016-07-14 DIAGNOSIS — C773 Secondary and unspecified malignant neoplasm of axilla and upper limb lymph nodes: Secondary | ICD-10-CM | POA: Diagnosis not present

## 2016-07-15 ENCOUNTER — Ambulatory Visit
Admission: RE | Admit: 2016-07-15 | Discharge: 2016-07-15 | Disposition: A | Discharge status: Home / Self Care | Payer: BLUE CROSS/BLUE SHIELD | Source: Ambulatory Visit | Attending: Radiation Oncology | Admitting: Radiation Oncology

## 2016-07-15 DIAGNOSIS — Z833 Family history of diabetes mellitus: Secondary | ICD-10-CM | POA: Diagnosis not present

## 2016-07-15 DIAGNOSIS — F419 Anxiety disorder, unspecified: Secondary | ICD-10-CM | POA: Diagnosis not present

## 2016-07-15 DIAGNOSIS — Z825 Family history of asthma and other chronic lower respiratory diseases: Secondary | ICD-10-CM | POA: Diagnosis not present

## 2016-07-15 DIAGNOSIS — C50411 Malignant neoplasm of upper-outer quadrant of right female breast: Secondary | ICD-10-CM | POA: Diagnosis not present

## 2016-07-15 DIAGNOSIS — F329 Major depressive disorder, single episode, unspecified: Secondary | ICD-10-CM | POA: Diagnosis not present

## 2016-07-15 DIAGNOSIS — C50811 Malignant neoplasm of overlapping sites of right female breast: Secondary | ICD-10-CM | POA: Diagnosis not present

## 2016-07-15 DIAGNOSIS — E039 Hypothyroidism, unspecified: Secondary | ICD-10-CM | POA: Diagnosis not present

## 2016-07-15 DIAGNOSIS — Z79899 Other long term (current) drug therapy: Secondary | ICD-10-CM | POA: Diagnosis not present

## 2016-07-15 DIAGNOSIS — Z510 Encounter for antineoplastic radiation therapy: Secondary | ICD-10-CM | POA: Diagnosis not present

## 2016-07-15 DIAGNOSIS — Z87891 Personal history of nicotine dependence: Secondary | ICD-10-CM | POA: Diagnosis not present

## 2016-07-15 DIAGNOSIS — Z9013 Acquired absence of bilateral breasts and nipples: Secondary | ICD-10-CM | POA: Diagnosis not present

## 2016-07-15 DIAGNOSIS — Z888 Allergy status to other drugs, medicaments and biological substances status: Secondary | ICD-10-CM | POA: Diagnosis not present

## 2016-07-15 DIAGNOSIS — Z170 Estrogen receptor positive status [ER+]: Secondary | ICD-10-CM | POA: Diagnosis not present

## 2016-07-15 DIAGNOSIS — Z800 Family history of malignant neoplasm of digestive organs: Secondary | ICD-10-CM | POA: Diagnosis not present

## 2016-07-15 DIAGNOSIS — Z8249 Family history of ischemic heart disease and other diseases of the circulatory system: Secondary | ICD-10-CM | POA: Diagnosis not present

## 2016-07-15 DIAGNOSIS — K219 Gastro-esophageal reflux disease without esophagitis: Secondary | ICD-10-CM | POA: Diagnosis not present

## 2016-07-15 DIAGNOSIS — C773 Secondary and unspecified malignant neoplasm of axilla and upper limb lymph nodes: Secondary | ICD-10-CM | POA: Diagnosis not present

## 2016-07-16 ENCOUNTER — Ambulatory Visit
Admission: RE | Admit: 2016-07-16 | Discharge: 2016-07-16 | Disposition: A | Discharge status: Home / Self Care | Payer: BLUE CROSS/BLUE SHIELD | Source: Ambulatory Visit | Attending: Radiation Oncology | Admitting: Radiation Oncology

## 2016-07-16 DIAGNOSIS — Z833 Family history of diabetes mellitus: Secondary | ICD-10-CM | POA: Diagnosis not present

## 2016-07-16 DIAGNOSIS — Z800 Family history of malignant neoplasm of digestive organs: Secondary | ICD-10-CM | POA: Diagnosis not present

## 2016-07-16 DIAGNOSIS — C50411 Malignant neoplasm of upper-outer quadrant of right female breast: Secondary | ICD-10-CM | POA: Diagnosis not present

## 2016-07-16 DIAGNOSIS — Z8249 Family history of ischemic heart disease and other diseases of the circulatory system: Secondary | ICD-10-CM | POA: Diagnosis not present

## 2016-07-16 DIAGNOSIS — E039 Hypothyroidism, unspecified: Secondary | ICD-10-CM | POA: Diagnosis not present

## 2016-07-16 DIAGNOSIS — Z825 Family history of asthma and other chronic lower respiratory diseases: Secondary | ICD-10-CM | POA: Diagnosis not present

## 2016-07-16 DIAGNOSIS — Z79899 Other long term (current) drug therapy: Secondary | ICD-10-CM | POA: Diagnosis not present

## 2016-07-16 DIAGNOSIS — F329 Major depressive disorder, single episode, unspecified: Secondary | ICD-10-CM | POA: Diagnosis not present

## 2016-07-16 DIAGNOSIS — Z510 Encounter for antineoplastic radiation therapy: Secondary | ICD-10-CM | POA: Diagnosis not present

## 2016-07-16 DIAGNOSIS — F419 Anxiety disorder, unspecified: Secondary | ICD-10-CM | POA: Diagnosis not present

## 2016-07-16 DIAGNOSIS — Z170 Estrogen receptor positive status [ER+]: Secondary | ICD-10-CM | POA: Diagnosis not present

## 2016-07-16 DIAGNOSIS — Z888 Allergy status to other drugs, medicaments and biological substances status: Secondary | ICD-10-CM | POA: Diagnosis not present

## 2016-07-16 DIAGNOSIS — C50811 Malignant neoplasm of overlapping sites of right female breast: Secondary | ICD-10-CM | POA: Diagnosis not present

## 2016-07-16 DIAGNOSIS — Z87891 Personal history of nicotine dependence: Secondary | ICD-10-CM | POA: Diagnosis not present

## 2016-07-16 DIAGNOSIS — Z9013 Acquired absence of bilateral breasts and nipples: Secondary | ICD-10-CM | POA: Diagnosis not present

## 2016-07-16 DIAGNOSIS — K219 Gastro-esophageal reflux disease without esophagitis: Secondary | ICD-10-CM | POA: Diagnosis not present

## 2016-07-16 DIAGNOSIS — C773 Secondary and unspecified malignant neoplasm of axilla and upper limb lymph nodes: Secondary | ICD-10-CM | POA: Diagnosis not present

## 2016-07-17 ENCOUNTER — Ambulatory Visit
Admission: RE | Admit: 2016-07-17 | Discharge: 2016-07-17 | Disposition: A | Discharge status: Home / Self Care | Payer: BLUE CROSS/BLUE SHIELD | Source: Ambulatory Visit | Attending: Radiation Oncology | Admitting: Radiation Oncology

## 2016-07-17 DIAGNOSIS — C773 Secondary and unspecified malignant neoplasm of axilla and upper limb lymph nodes: Secondary | ICD-10-CM | POA: Diagnosis not present

## 2016-07-17 DIAGNOSIS — Z833 Family history of diabetes mellitus: Secondary | ICD-10-CM | POA: Diagnosis not present

## 2016-07-17 DIAGNOSIS — Z170 Estrogen receptor positive status [ER+]: Secondary | ICD-10-CM | POA: Diagnosis not present

## 2016-07-17 DIAGNOSIS — E039 Hypothyroidism, unspecified: Secondary | ICD-10-CM | POA: Diagnosis not present

## 2016-07-17 DIAGNOSIS — F329 Major depressive disorder, single episode, unspecified: Secondary | ICD-10-CM | POA: Diagnosis not present

## 2016-07-17 DIAGNOSIS — C50811 Malignant neoplasm of overlapping sites of right female breast: Secondary | ICD-10-CM | POA: Diagnosis not present

## 2016-07-17 DIAGNOSIS — Z888 Allergy status to other drugs, medicaments and biological substances status: Secondary | ICD-10-CM | POA: Diagnosis not present

## 2016-07-17 DIAGNOSIS — K219 Gastro-esophageal reflux disease without esophagitis: Secondary | ICD-10-CM | POA: Diagnosis not present

## 2016-07-17 DIAGNOSIS — Z9013 Acquired absence of bilateral breasts and nipples: Secondary | ICD-10-CM | POA: Diagnosis not present

## 2016-07-17 DIAGNOSIS — Z800 Family history of malignant neoplasm of digestive organs: Secondary | ICD-10-CM | POA: Diagnosis not present

## 2016-07-17 DIAGNOSIS — Z825 Family history of asthma and other chronic lower respiratory diseases: Secondary | ICD-10-CM | POA: Diagnosis not present

## 2016-07-17 DIAGNOSIS — Z87891 Personal history of nicotine dependence: Secondary | ICD-10-CM | POA: Diagnosis not present

## 2016-07-17 DIAGNOSIS — Z8249 Family history of ischemic heart disease and other diseases of the circulatory system: Secondary | ICD-10-CM | POA: Diagnosis not present

## 2016-07-17 DIAGNOSIS — C50411 Malignant neoplasm of upper-outer quadrant of right female breast: Secondary | ICD-10-CM | POA: Diagnosis not present

## 2016-07-17 DIAGNOSIS — Z79899 Other long term (current) drug therapy: Secondary | ICD-10-CM | POA: Diagnosis not present

## 2016-07-17 DIAGNOSIS — Z510 Encounter for antineoplastic radiation therapy: Secondary | ICD-10-CM | POA: Diagnosis not present

## 2016-07-17 DIAGNOSIS — F419 Anxiety disorder, unspecified: Secondary | ICD-10-CM | POA: Diagnosis not present

## 2016-07-17 MED ORDER — BIAFINE EX EMUL: Freq: Every day | CUTANEOUS | Status: DC

## 2016-07-21 ENCOUNTER — Ambulatory Visit
Admission: RE | Admit: 2016-07-21 | Discharge: 2016-07-21 | Disposition: A | Discharge status: Home / Self Care | Payer: BLUE CROSS/BLUE SHIELD | Source: Ambulatory Visit | Attending: Radiation Oncology | Admitting: Radiation Oncology

## 2016-07-21 ENCOUNTER — Encounter: Payer: Self-pay | Admitting: Radiation Oncology

## 2016-07-21 DIAGNOSIS — K219 Gastro-esophageal reflux disease without esophagitis: Secondary | ICD-10-CM | POA: Diagnosis not present

## 2016-07-21 DIAGNOSIS — Z825 Family history of asthma and other chronic lower respiratory diseases: Secondary | ICD-10-CM | POA: Diagnosis not present

## 2016-07-21 DIAGNOSIS — F329 Major depressive disorder, single episode, unspecified: Secondary | ICD-10-CM | POA: Diagnosis not present

## 2016-07-21 DIAGNOSIS — Z833 Family history of diabetes mellitus: Secondary | ICD-10-CM | POA: Diagnosis not present

## 2016-07-21 DIAGNOSIS — C50811 Malignant neoplasm of overlapping sites of right female breast: Secondary | ICD-10-CM | POA: Diagnosis not present

## 2016-07-21 DIAGNOSIS — Z87891 Personal history of nicotine dependence: Secondary | ICD-10-CM | POA: Diagnosis not present

## 2016-07-21 DIAGNOSIS — Z800 Family history of malignant neoplasm of digestive organs: Secondary | ICD-10-CM | POA: Diagnosis not present

## 2016-07-21 DIAGNOSIS — E039 Hypothyroidism, unspecified: Secondary | ICD-10-CM | POA: Diagnosis not present

## 2016-07-21 DIAGNOSIS — C773 Secondary and unspecified malignant neoplasm of axilla and upper limb lymph nodes: Secondary | ICD-10-CM | POA: Diagnosis not present

## 2016-07-21 DIAGNOSIS — Z79899 Other long term (current) drug therapy: Secondary | ICD-10-CM | POA: Diagnosis not present

## 2016-07-21 DIAGNOSIS — Z8249 Family history of ischemic heart disease and other diseases of the circulatory system: Secondary | ICD-10-CM | POA: Diagnosis not present

## 2016-07-21 DIAGNOSIS — Z510 Encounter for antineoplastic radiation therapy: Secondary | ICD-10-CM | POA: Diagnosis not present

## 2016-07-21 DIAGNOSIS — Z888 Allergy status to other drugs, medicaments and biological substances status: Secondary | ICD-10-CM | POA: Diagnosis not present

## 2016-07-21 DIAGNOSIS — F419 Anxiety disorder, unspecified: Secondary | ICD-10-CM | POA: Diagnosis not present

## 2016-07-21 DIAGNOSIS — Z9013 Acquired absence of bilateral breasts and nipples: Secondary | ICD-10-CM | POA: Diagnosis not present

## 2016-07-21 DIAGNOSIS — C50411 Malignant neoplasm of upper-outer quadrant of right female breast: Secondary | ICD-10-CM | POA: Diagnosis not present

## 2016-07-21 DIAGNOSIS — Z170 Estrogen receptor positive status [ER+]: Secondary | ICD-10-CM | POA: Diagnosis not present

## 2016-07-21 NOTE — Progress Notes (Signed)
Paperwork ([employer]) received 07/21/2016, they are wanting additional information, given to nurse 07/22/16

## 2016-07-22 ENCOUNTER — Ambulatory Visit
Admission: RE | Admit: 2016-07-22 | Discharge: 2016-07-22 | Disposition: A | Discharge status: Home / Self Care | Payer: BLUE CROSS/BLUE SHIELD | Source: Ambulatory Visit | Attending: Radiation Oncology | Admitting: Radiation Oncology

## 2016-07-22 ENCOUNTER — Encounter: Payer: Self-pay | Admitting: Radiation Oncology

## 2016-07-22 DIAGNOSIS — F419 Anxiety disorder, unspecified: Secondary | ICD-10-CM | POA: Diagnosis not present

## 2016-07-22 DIAGNOSIS — Z87891 Personal history of nicotine dependence: Secondary | ICD-10-CM | POA: Diagnosis not present

## 2016-07-22 DIAGNOSIS — Z8249 Family history of ischemic heart disease and other diseases of the circulatory system: Secondary | ICD-10-CM | POA: Diagnosis not present

## 2016-07-22 DIAGNOSIS — E039 Hypothyroidism, unspecified: Secondary | ICD-10-CM | POA: Diagnosis not present

## 2016-07-22 DIAGNOSIS — C773 Secondary and unspecified malignant neoplasm of axilla and upper limb lymph nodes: Secondary | ICD-10-CM | POA: Diagnosis not present

## 2016-07-22 DIAGNOSIS — Z170 Estrogen receptor positive status [ER+]: Secondary | ICD-10-CM | POA: Diagnosis not present

## 2016-07-22 DIAGNOSIS — Z800 Family history of malignant neoplasm of digestive organs: Secondary | ICD-10-CM | POA: Diagnosis not present

## 2016-07-22 DIAGNOSIS — Z825 Family history of asthma and other chronic lower respiratory diseases: Secondary | ICD-10-CM | POA: Diagnosis not present

## 2016-07-22 DIAGNOSIS — C50411 Malignant neoplasm of upper-outer quadrant of right female breast: Secondary | ICD-10-CM | POA: Diagnosis not present

## 2016-07-22 DIAGNOSIS — C50811 Malignant neoplasm of overlapping sites of right female breast: Secondary | ICD-10-CM | POA: Diagnosis not present

## 2016-07-22 DIAGNOSIS — Z888 Allergy status to other drugs, medicaments and biological substances status: Secondary | ICD-10-CM | POA: Diagnosis not present

## 2016-07-22 DIAGNOSIS — Z79899 Other long term (current) drug therapy: Secondary | ICD-10-CM | POA: Diagnosis not present

## 2016-07-22 DIAGNOSIS — Z9013 Acquired absence of bilateral breasts and nipples: Secondary | ICD-10-CM | POA: Diagnosis not present

## 2016-07-22 DIAGNOSIS — F329 Major depressive disorder, single episode, unspecified: Secondary | ICD-10-CM | POA: Diagnosis not present

## 2016-07-22 DIAGNOSIS — Z510 Encounter for antineoplastic radiation therapy: Secondary | ICD-10-CM | POA: Diagnosis not present

## 2016-07-22 DIAGNOSIS — Z833 Family history of diabetes mellitus: Secondary | ICD-10-CM | POA: Diagnosis not present

## 2016-07-22 DIAGNOSIS — K219 Gastro-esophageal reflux disease without esophagitis: Secondary | ICD-10-CM | POA: Diagnosis not present

## 2016-07-23 ENCOUNTER — Ambulatory Visit
Admission: RE | Admit: 2016-07-23 | Discharge: 2016-07-23 | Disposition: A | Discharge status: Home / Self Care | Payer: BLUE CROSS/BLUE SHIELD | Source: Ambulatory Visit | Attending: Radiation Oncology | Admitting: Radiation Oncology

## 2016-07-23 ENCOUNTER — Encounter: Payer: Self-pay | Admitting: Radiation Oncology

## 2016-07-23 DIAGNOSIS — F419 Anxiety disorder, unspecified: Secondary | ICD-10-CM | POA: Diagnosis not present

## 2016-07-23 DIAGNOSIS — Z510 Encounter for antineoplastic radiation therapy: Secondary | ICD-10-CM | POA: Diagnosis not present

## 2016-07-23 DIAGNOSIS — Z87891 Personal history of nicotine dependence: Secondary | ICD-10-CM | POA: Diagnosis not present

## 2016-07-23 DIAGNOSIS — Z8249 Family history of ischemic heart disease and other diseases of the circulatory system: Secondary | ICD-10-CM | POA: Diagnosis not present

## 2016-07-23 DIAGNOSIS — K219 Gastro-esophageal reflux disease without esophagitis: Secondary | ICD-10-CM | POA: Diagnosis not present

## 2016-07-23 DIAGNOSIS — Z833 Family history of diabetes mellitus: Secondary | ICD-10-CM | POA: Diagnosis not present

## 2016-07-23 DIAGNOSIS — Z888 Allergy status to other drugs, medicaments and biological substances status: Secondary | ICD-10-CM | POA: Diagnosis not present

## 2016-07-23 DIAGNOSIS — C50411 Malignant neoplasm of upper-outer quadrant of right female breast: Secondary | ICD-10-CM | POA: Diagnosis not present

## 2016-07-23 DIAGNOSIS — C50811 Malignant neoplasm of overlapping sites of right female breast: Secondary | ICD-10-CM | POA: Diagnosis not present

## 2016-07-23 DIAGNOSIS — Z79899 Other long term (current) drug therapy: Secondary | ICD-10-CM | POA: Diagnosis not present

## 2016-07-23 DIAGNOSIS — Z170 Estrogen receptor positive status [ER+]: Secondary | ICD-10-CM | POA: Diagnosis not present

## 2016-07-23 DIAGNOSIS — Z9013 Acquired absence of bilateral breasts and nipples: Secondary | ICD-10-CM | POA: Diagnosis not present

## 2016-07-23 DIAGNOSIS — Z825 Family history of asthma and other chronic lower respiratory diseases: Secondary | ICD-10-CM | POA: Diagnosis not present

## 2016-07-23 DIAGNOSIS — E039 Hypothyroidism, unspecified: Secondary | ICD-10-CM | POA: Diagnosis not present

## 2016-07-23 DIAGNOSIS — C773 Secondary and unspecified malignant neoplasm of axilla and upper limb lymph nodes: Secondary | ICD-10-CM | POA: Diagnosis not present

## 2016-07-23 DIAGNOSIS — Z800 Family history of malignant neoplasm of digestive organs: Secondary | ICD-10-CM | POA: Diagnosis not present

## 2016-07-23 DIAGNOSIS — F329 Major depressive disorder, single episode, unspecified: Secondary | ICD-10-CM | POA: Diagnosis not present

## 2016-07-23 NOTE — Progress Notes (Signed)
Paperwork ([employer]) received from doctor, per patient faxed to [facility] @ [phone], confirmation received, copy given to pt, 07/23/16

## 2016-07-24 ENCOUNTER — Ambulatory Visit
Admission: RE | Admit: 2016-07-24 | Discharge: 2016-07-24 | Disposition: A | Discharge status: Home / Self Care | Payer: BLUE CROSS/BLUE SHIELD | Source: Ambulatory Visit | Attending: Radiation Oncology | Admitting: Radiation Oncology

## 2016-07-24 DIAGNOSIS — C50411 Malignant neoplasm of upper-outer quadrant of right female breast: Secondary | ICD-10-CM | POA: Diagnosis not present

## 2016-07-24 DIAGNOSIS — F419 Anxiety disorder, unspecified: Secondary | ICD-10-CM | POA: Diagnosis not present

## 2016-07-24 DIAGNOSIS — F329 Major depressive disorder, single episode, unspecified: Secondary | ICD-10-CM | POA: Diagnosis not present

## 2016-07-24 DIAGNOSIS — Z87891 Personal history of nicotine dependence: Secondary | ICD-10-CM | POA: Diagnosis not present

## 2016-07-24 DIAGNOSIS — K219 Gastro-esophageal reflux disease without esophagitis: Secondary | ICD-10-CM | POA: Diagnosis not present

## 2016-07-24 DIAGNOSIS — Z833 Family history of diabetes mellitus: Secondary | ICD-10-CM | POA: Diagnosis not present

## 2016-07-24 DIAGNOSIS — Z9013 Acquired absence of bilateral breasts and nipples: Secondary | ICD-10-CM | POA: Diagnosis not present

## 2016-07-24 DIAGNOSIS — Z510 Encounter for antineoplastic radiation therapy: Secondary | ICD-10-CM | POA: Diagnosis not present

## 2016-07-24 DIAGNOSIS — Z800 Family history of malignant neoplasm of digestive organs: Secondary | ICD-10-CM | POA: Diagnosis not present

## 2016-07-24 DIAGNOSIS — C50811 Malignant neoplasm of overlapping sites of right female breast: Secondary | ICD-10-CM | POA: Diagnosis not present

## 2016-07-24 DIAGNOSIS — Z888 Allergy status to other drugs, medicaments and biological substances status: Secondary | ICD-10-CM | POA: Diagnosis not present

## 2016-07-24 DIAGNOSIS — C773 Secondary and unspecified malignant neoplasm of axilla and upper limb lymph nodes: Secondary | ICD-10-CM | POA: Diagnosis not present

## 2016-07-24 DIAGNOSIS — Z79899 Other long term (current) drug therapy: Secondary | ICD-10-CM | POA: Diagnosis not present

## 2016-07-24 DIAGNOSIS — Z170 Estrogen receptor positive status [ER+]: Secondary | ICD-10-CM | POA: Diagnosis not present

## 2016-07-24 DIAGNOSIS — E039 Hypothyroidism, unspecified: Secondary | ICD-10-CM | POA: Diagnosis not present

## 2016-07-24 DIAGNOSIS — Z825 Family history of asthma and other chronic lower respiratory diseases: Secondary | ICD-10-CM | POA: Diagnosis not present

## 2016-07-24 DIAGNOSIS — Z8249 Family history of ischemic heart disease and other diseases of the circulatory system: Secondary | ICD-10-CM | POA: Diagnosis not present

## 2016-07-27 ENCOUNTER — Ambulatory Visit
Admission: RE | Admit: 2016-07-27 | Discharge: 2016-07-27 | Disposition: A | Discharge status: Home / Self Care | Payer: BLUE CROSS/BLUE SHIELD | Source: Ambulatory Visit | Attending: Radiation Oncology | Admitting: Radiation Oncology

## 2016-07-27 DIAGNOSIS — E039 Hypothyroidism, unspecified: Secondary | ICD-10-CM | POA: Diagnosis not present

## 2016-07-27 DIAGNOSIS — Z8249 Family history of ischemic heart disease and other diseases of the circulatory system: Secondary | ICD-10-CM | POA: Diagnosis not present

## 2016-07-27 DIAGNOSIS — Z510 Encounter for antineoplastic radiation therapy: Secondary | ICD-10-CM | POA: Diagnosis not present

## 2016-07-27 DIAGNOSIS — F329 Major depressive disorder, single episode, unspecified: Secondary | ICD-10-CM | POA: Diagnosis not present

## 2016-07-27 DIAGNOSIS — C50811 Malignant neoplasm of overlapping sites of right female breast: Secondary | ICD-10-CM | POA: Diagnosis not present

## 2016-07-27 DIAGNOSIS — Z79899 Other long term (current) drug therapy: Secondary | ICD-10-CM | POA: Diagnosis not present

## 2016-07-27 DIAGNOSIS — C773 Secondary and unspecified malignant neoplasm of axilla and upper limb lymph nodes: Secondary | ICD-10-CM | POA: Diagnosis not present

## 2016-07-27 DIAGNOSIS — K219 Gastro-esophageal reflux disease without esophagitis: Secondary | ICD-10-CM | POA: Diagnosis not present

## 2016-07-27 DIAGNOSIS — C50411 Malignant neoplasm of upper-outer quadrant of right female breast: Secondary | ICD-10-CM | POA: Diagnosis not present

## 2016-07-27 DIAGNOSIS — Z800 Family history of malignant neoplasm of digestive organs: Secondary | ICD-10-CM | POA: Diagnosis not present

## 2016-07-27 DIAGNOSIS — Z833 Family history of diabetes mellitus: Secondary | ICD-10-CM | POA: Diagnosis not present

## 2016-07-27 DIAGNOSIS — F419 Anxiety disorder, unspecified: Secondary | ICD-10-CM | POA: Diagnosis not present

## 2016-07-27 DIAGNOSIS — Z888 Allergy status to other drugs, medicaments and biological substances status: Secondary | ICD-10-CM | POA: Diagnosis not present

## 2016-07-27 DIAGNOSIS — Z170 Estrogen receptor positive status [ER+]: Secondary | ICD-10-CM | POA: Diagnosis not present

## 2016-07-27 DIAGNOSIS — Z9013 Acquired absence of bilateral breasts and nipples: Secondary | ICD-10-CM | POA: Diagnosis not present

## 2016-07-27 DIAGNOSIS — Z87891 Personal history of nicotine dependence: Secondary | ICD-10-CM | POA: Diagnosis not present

## 2016-07-27 DIAGNOSIS — Z825 Family history of asthma and other chronic lower respiratory diseases: Secondary | ICD-10-CM | POA: Diagnosis not present

## 2016-07-28 ENCOUNTER — Ambulatory Visit
Admission: RE | Admit: 2016-07-28 | Discharge: 2016-07-28 | Disposition: A | Discharge status: Home / Self Care | Payer: BLUE CROSS/BLUE SHIELD | Source: Ambulatory Visit | Attending: Radiation Oncology | Admitting: Radiation Oncology

## 2016-07-28 DIAGNOSIS — E039 Hypothyroidism, unspecified: Secondary | ICD-10-CM | POA: Diagnosis not present

## 2016-07-28 DIAGNOSIS — Z8249 Family history of ischemic heart disease and other diseases of the circulatory system: Secondary | ICD-10-CM | POA: Diagnosis not present

## 2016-07-28 DIAGNOSIS — C773 Secondary and unspecified malignant neoplasm of axilla and upper limb lymph nodes: Secondary | ICD-10-CM | POA: Diagnosis not present

## 2016-07-28 DIAGNOSIS — Z9013 Acquired absence of bilateral breasts and nipples: Secondary | ICD-10-CM | POA: Diagnosis not present

## 2016-07-28 DIAGNOSIS — Z888 Allergy status to other drugs, medicaments and biological substances status: Secondary | ICD-10-CM | POA: Diagnosis not present

## 2016-07-28 DIAGNOSIS — Z170 Estrogen receptor positive status [ER+]: Secondary | ICD-10-CM | POA: Diagnosis not present

## 2016-07-28 DIAGNOSIS — C50811 Malignant neoplasm of overlapping sites of right female breast: Secondary | ICD-10-CM | POA: Diagnosis not present

## 2016-07-28 DIAGNOSIS — Z87891 Personal history of nicotine dependence: Secondary | ICD-10-CM | POA: Diagnosis not present

## 2016-07-28 DIAGNOSIS — F419 Anxiety disorder, unspecified: Secondary | ICD-10-CM | POA: Diagnosis not present

## 2016-07-28 DIAGNOSIS — C50411 Malignant neoplasm of upper-outer quadrant of right female breast: Secondary | ICD-10-CM | POA: Diagnosis not present

## 2016-07-28 DIAGNOSIS — Z800 Family history of malignant neoplasm of digestive organs: Secondary | ICD-10-CM | POA: Diagnosis not present

## 2016-07-28 DIAGNOSIS — Z79899 Other long term (current) drug therapy: Secondary | ICD-10-CM | POA: Diagnosis not present

## 2016-07-28 DIAGNOSIS — Z833 Family history of diabetes mellitus: Secondary | ICD-10-CM | POA: Diagnosis not present

## 2016-07-28 DIAGNOSIS — Z825 Family history of asthma and other chronic lower respiratory diseases: Secondary | ICD-10-CM | POA: Diagnosis not present

## 2016-07-28 DIAGNOSIS — F329 Major depressive disorder, single episode, unspecified: Secondary | ICD-10-CM | POA: Diagnosis not present

## 2016-07-28 DIAGNOSIS — Z510 Encounter for antineoplastic radiation therapy: Secondary | ICD-10-CM | POA: Diagnosis not present

## 2016-07-28 DIAGNOSIS — K219 Gastro-esophageal reflux disease without esophagitis: Secondary | ICD-10-CM | POA: Diagnosis not present

## 2016-07-29 ENCOUNTER — Ambulatory Visit
Admission: RE | Admit: 2016-07-29 | Discharge: 2016-07-29 | Disposition: A | Discharge status: Home / Self Care | Payer: BLUE CROSS/BLUE SHIELD | Source: Ambulatory Visit | Attending: Radiation Oncology | Admitting: Radiation Oncology

## 2016-07-29 DIAGNOSIS — Z825 Family history of asthma and other chronic lower respiratory diseases: Secondary | ICD-10-CM | POA: Diagnosis not present

## 2016-07-29 DIAGNOSIS — C773 Secondary and unspecified malignant neoplasm of axilla and upper limb lymph nodes: Secondary | ICD-10-CM | POA: Diagnosis not present

## 2016-07-29 DIAGNOSIS — E039 Hypothyroidism, unspecified: Secondary | ICD-10-CM | POA: Diagnosis not present

## 2016-07-29 DIAGNOSIS — Z170 Estrogen receptor positive status [ER+]: Secondary | ICD-10-CM | POA: Diagnosis not present

## 2016-07-29 DIAGNOSIS — F419 Anxiety disorder, unspecified: Secondary | ICD-10-CM | POA: Diagnosis not present

## 2016-07-29 DIAGNOSIS — F329 Major depressive disorder, single episode, unspecified: Secondary | ICD-10-CM | POA: Diagnosis not present

## 2016-07-29 DIAGNOSIS — Z833 Family history of diabetes mellitus: Secondary | ICD-10-CM | POA: Diagnosis not present

## 2016-07-29 DIAGNOSIS — Z8249 Family history of ischemic heart disease and other diseases of the circulatory system: Secondary | ICD-10-CM | POA: Diagnosis not present

## 2016-07-29 DIAGNOSIS — C50811 Malignant neoplasm of overlapping sites of right female breast: Secondary | ICD-10-CM | POA: Diagnosis not present

## 2016-07-29 DIAGNOSIS — C50411 Malignant neoplasm of upper-outer quadrant of right female breast: Secondary | ICD-10-CM | POA: Diagnosis not present

## 2016-07-29 DIAGNOSIS — K219 Gastro-esophageal reflux disease without esophagitis: Secondary | ICD-10-CM | POA: Diagnosis not present

## 2016-07-29 DIAGNOSIS — Z79899 Other long term (current) drug therapy: Secondary | ICD-10-CM | POA: Diagnosis not present

## 2016-07-29 DIAGNOSIS — Z510 Encounter for antineoplastic radiation therapy: Secondary | ICD-10-CM | POA: Diagnosis not present

## 2016-07-29 DIAGNOSIS — Z9013 Acquired absence of bilateral breasts and nipples: Secondary | ICD-10-CM | POA: Diagnosis not present

## 2016-07-29 DIAGNOSIS — Z800 Family history of malignant neoplasm of digestive organs: Secondary | ICD-10-CM | POA: Diagnosis not present

## 2016-07-29 DIAGNOSIS — Z87891 Personal history of nicotine dependence: Secondary | ICD-10-CM | POA: Diagnosis not present

## 2016-07-29 DIAGNOSIS — Z888 Allergy status to other drugs, medicaments and biological substances status: Secondary | ICD-10-CM | POA: Diagnosis not present

## 2016-07-30 ENCOUNTER — Ambulatory Visit
Admission: RE | Admit: 2016-07-30 | Discharge: 2016-07-30 | Disposition: A | Discharge status: Home / Self Care | Payer: BLUE CROSS/BLUE SHIELD | Source: Ambulatory Visit | Attending: Radiation Oncology | Admitting: Radiation Oncology

## 2016-07-30 DIAGNOSIS — Z170 Estrogen receptor positive status [ER+]: Secondary | ICD-10-CM | POA: Diagnosis not present

## 2016-07-30 DIAGNOSIS — C50411 Malignant neoplasm of upper-outer quadrant of right female breast: Secondary | ICD-10-CM | POA: Diagnosis not present

## 2016-07-30 DIAGNOSIS — C773 Secondary and unspecified malignant neoplasm of axilla and upper limb lymph nodes: Secondary | ICD-10-CM | POA: Diagnosis not present

## 2016-07-30 DIAGNOSIS — Z9013 Acquired absence of bilateral breasts and nipples: Secondary | ICD-10-CM | POA: Diagnosis not present

## 2016-07-30 DIAGNOSIS — Z825 Family history of asthma and other chronic lower respiratory diseases: Secondary | ICD-10-CM | POA: Diagnosis not present

## 2016-07-30 DIAGNOSIS — F419 Anxiety disorder, unspecified: Secondary | ICD-10-CM | POA: Diagnosis not present

## 2016-07-30 DIAGNOSIS — Z79899 Other long term (current) drug therapy: Secondary | ICD-10-CM | POA: Diagnosis not present

## 2016-07-30 DIAGNOSIS — Z833 Family history of diabetes mellitus: Secondary | ICD-10-CM | POA: Diagnosis not present

## 2016-07-30 DIAGNOSIS — C50811 Malignant neoplasm of overlapping sites of right female breast: Secondary | ICD-10-CM | POA: Diagnosis not present

## 2016-07-30 DIAGNOSIS — Z800 Family history of malignant neoplasm of digestive organs: Secondary | ICD-10-CM | POA: Diagnosis not present

## 2016-07-30 DIAGNOSIS — K219 Gastro-esophageal reflux disease without esophagitis: Secondary | ICD-10-CM | POA: Diagnosis not present

## 2016-07-30 DIAGNOSIS — E039 Hypothyroidism, unspecified: Secondary | ICD-10-CM | POA: Diagnosis not present

## 2016-07-30 DIAGNOSIS — Z888 Allergy status to other drugs, medicaments and biological substances status: Secondary | ICD-10-CM | POA: Diagnosis not present

## 2016-07-30 DIAGNOSIS — Z8249 Family history of ischemic heart disease and other diseases of the circulatory system: Secondary | ICD-10-CM | POA: Diagnosis not present

## 2016-07-30 DIAGNOSIS — Z87891 Personal history of nicotine dependence: Secondary | ICD-10-CM | POA: Diagnosis not present

## 2016-07-30 DIAGNOSIS — F329 Major depressive disorder, single episode, unspecified: Secondary | ICD-10-CM | POA: Diagnosis not present

## 2016-07-30 DIAGNOSIS — Z510 Encounter for antineoplastic radiation therapy: Secondary | ICD-10-CM | POA: Diagnosis not present

## 2016-07-31 ENCOUNTER — Ambulatory Visit: Payer: BLUE CROSS/BLUE SHIELD | Admitting: Radiation Oncology

## 2016-07-31 ENCOUNTER — Ambulatory Visit
Admission: RE | Admit: 2016-07-31 | Discharge: 2016-07-31 | Disposition: A | Discharge status: Home / Self Care | Payer: BLUE CROSS/BLUE SHIELD | Source: Ambulatory Visit | Attending: Radiation Oncology | Admitting: Radiation Oncology

## 2016-07-31 DIAGNOSIS — Z833 Family history of diabetes mellitus: Secondary | ICD-10-CM | POA: Diagnosis not present

## 2016-07-31 DIAGNOSIS — K219 Gastro-esophageal reflux disease without esophagitis: Secondary | ICD-10-CM | POA: Diagnosis not present

## 2016-07-31 DIAGNOSIS — Z170 Estrogen receptor positive status [ER+]: Secondary | ICD-10-CM | POA: Diagnosis not present

## 2016-07-31 DIAGNOSIS — C50411 Malignant neoplasm of upper-outer quadrant of right female breast: Secondary | ICD-10-CM | POA: Diagnosis not present

## 2016-07-31 DIAGNOSIS — Z9013 Acquired absence of bilateral breasts and nipples: Secondary | ICD-10-CM | POA: Diagnosis not present

## 2016-07-31 DIAGNOSIS — F419 Anxiety disorder, unspecified: Secondary | ICD-10-CM | POA: Diagnosis not present

## 2016-07-31 DIAGNOSIS — Z888 Allergy status to other drugs, medicaments and biological substances status: Secondary | ICD-10-CM | POA: Diagnosis not present

## 2016-07-31 DIAGNOSIS — Z825 Family history of asthma and other chronic lower respiratory diseases: Secondary | ICD-10-CM | POA: Diagnosis not present

## 2016-07-31 DIAGNOSIS — Z800 Family history of malignant neoplasm of digestive organs: Secondary | ICD-10-CM | POA: Diagnosis not present

## 2016-07-31 DIAGNOSIS — C50811 Malignant neoplasm of overlapping sites of right female breast: Secondary | ICD-10-CM | POA: Diagnosis not present

## 2016-07-31 DIAGNOSIS — Z79899 Other long term (current) drug therapy: Secondary | ICD-10-CM | POA: Diagnosis not present

## 2016-07-31 DIAGNOSIS — C773 Secondary and unspecified malignant neoplasm of axilla and upper limb lymph nodes: Secondary | ICD-10-CM | POA: Diagnosis not present

## 2016-07-31 DIAGNOSIS — E039 Hypothyroidism, unspecified: Secondary | ICD-10-CM | POA: Diagnosis not present

## 2016-07-31 DIAGNOSIS — Z87891 Personal history of nicotine dependence: Secondary | ICD-10-CM | POA: Diagnosis not present

## 2016-07-31 DIAGNOSIS — Z8249 Family history of ischemic heart disease and other diseases of the circulatory system: Secondary | ICD-10-CM | POA: Diagnosis not present

## 2016-07-31 DIAGNOSIS — F329 Major depressive disorder, single episode, unspecified: Secondary | ICD-10-CM | POA: Diagnosis not present

## 2016-07-31 DIAGNOSIS — Z510 Encounter for antineoplastic radiation therapy: Secondary | ICD-10-CM | POA: Diagnosis not present

## 2016-07-31 NOTE — Progress Notes (Signed)
1115 Has received 26 treatments to right breast,right breast and axilla with swelling and  moderate erythema,beginning to have small amount of peeling to upper chest, using Radiaplex gel and neosporin with lidocaine.  Pain 9/10 taking Tylenol and Aleve with some relief.  Having fatigue.  Appetite is good.  Weight and vital signs are stable.  98.2-73-18 98% 128/84

## 2016-08-03 ENCOUNTER — Ambulatory Visit
Admission: RE | Admit: 2016-08-03 | Discharge: 2016-08-03 | Disposition: A | Discharge status: Home / Self Care | Payer: BLUE CROSS/BLUE SHIELD | Source: Ambulatory Visit | Attending: Radiation Oncology | Admitting: Radiation Oncology

## 2016-08-03 DIAGNOSIS — Z170 Estrogen receptor positive status [ER+]: Secondary | ICD-10-CM | POA: Diagnosis not present

## 2016-08-03 DIAGNOSIS — Z888 Allergy status to other drugs, medicaments and biological substances status: Secondary | ICD-10-CM | POA: Diagnosis not present

## 2016-08-03 DIAGNOSIS — C50811 Malignant neoplasm of overlapping sites of right female breast: Secondary | ICD-10-CM | POA: Diagnosis not present

## 2016-08-03 DIAGNOSIS — Z833 Family history of diabetes mellitus: Secondary | ICD-10-CM | POA: Diagnosis not present

## 2016-08-03 DIAGNOSIS — K219 Gastro-esophageal reflux disease without esophagitis: Secondary | ICD-10-CM | POA: Diagnosis not present

## 2016-08-03 DIAGNOSIS — E039 Hypothyroidism, unspecified: Secondary | ICD-10-CM | POA: Diagnosis not present

## 2016-08-03 DIAGNOSIS — Z79899 Other long term (current) drug therapy: Secondary | ICD-10-CM | POA: Diagnosis not present

## 2016-08-03 DIAGNOSIS — F419 Anxiety disorder, unspecified: Secondary | ICD-10-CM | POA: Diagnosis not present

## 2016-08-03 DIAGNOSIS — Z510 Encounter for antineoplastic radiation therapy: Secondary | ICD-10-CM | POA: Diagnosis not present

## 2016-08-03 DIAGNOSIS — Z825 Family history of asthma and other chronic lower respiratory diseases: Secondary | ICD-10-CM | POA: Diagnosis not present

## 2016-08-03 DIAGNOSIS — Z87891 Personal history of nicotine dependence: Secondary | ICD-10-CM | POA: Diagnosis not present

## 2016-08-03 DIAGNOSIS — C773 Secondary and unspecified malignant neoplasm of axilla and upper limb lymph nodes: Secondary | ICD-10-CM | POA: Diagnosis not present

## 2016-08-03 DIAGNOSIS — C50411 Malignant neoplasm of upper-outer quadrant of right female breast: Secondary | ICD-10-CM | POA: Diagnosis not present

## 2016-08-03 DIAGNOSIS — Z8249 Family history of ischemic heart disease and other diseases of the circulatory system: Secondary | ICD-10-CM | POA: Diagnosis not present

## 2016-08-03 DIAGNOSIS — F329 Major depressive disorder, single episode, unspecified: Secondary | ICD-10-CM | POA: Diagnosis not present

## 2016-08-03 DIAGNOSIS — Z9013 Acquired absence of bilateral breasts and nipples: Secondary | ICD-10-CM | POA: Diagnosis not present

## 2016-08-03 DIAGNOSIS — Z800 Family history of malignant neoplasm of digestive organs: Secondary | ICD-10-CM | POA: Diagnosis not present

## 2016-08-04 ENCOUNTER — Ambulatory Visit
Admission: RE | Admit: 2016-08-04 | Discharge: 2016-08-04 | Disposition: A | Discharge status: Home / Self Care | Payer: BLUE CROSS/BLUE SHIELD | Source: Ambulatory Visit | Attending: Radiation Oncology | Admitting: Radiation Oncology

## 2016-08-04 DIAGNOSIS — Z79899 Other long term (current) drug therapy: Secondary | ICD-10-CM | POA: Diagnosis not present

## 2016-08-04 DIAGNOSIS — Z800 Family history of malignant neoplasm of digestive organs: Secondary | ICD-10-CM | POA: Diagnosis not present

## 2016-08-04 DIAGNOSIS — Z9013 Acquired absence of bilateral breasts and nipples: Secondary | ICD-10-CM | POA: Diagnosis not present

## 2016-08-04 DIAGNOSIS — Z8249 Family history of ischemic heart disease and other diseases of the circulatory system: Secondary | ICD-10-CM | POA: Diagnosis not present

## 2016-08-04 DIAGNOSIS — Z87891 Personal history of nicotine dependence: Secondary | ICD-10-CM | POA: Diagnosis not present

## 2016-08-04 DIAGNOSIS — F419 Anxiety disorder, unspecified: Secondary | ICD-10-CM | POA: Diagnosis not present

## 2016-08-04 DIAGNOSIS — F329 Major depressive disorder, single episode, unspecified: Secondary | ICD-10-CM | POA: Diagnosis not present

## 2016-08-04 DIAGNOSIS — Z170 Estrogen receptor positive status [ER+]: Secondary | ICD-10-CM | POA: Diagnosis not present

## 2016-08-04 DIAGNOSIS — Z833 Family history of diabetes mellitus: Secondary | ICD-10-CM | POA: Diagnosis not present

## 2016-08-04 DIAGNOSIS — C50811 Malignant neoplasm of overlapping sites of right female breast: Secondary | ICD-10-CM | POA: Diagnosis not present

## 2016-08-04 DIAGNOSIS — Z888 Allergy status to other drugs, medicaments and biological substances status: Secondary | ICD-10-CM | POA: Diagnosis not present

## 2016-08-04 DIAGNOSIS — E039 Hypothyroidism, unspecified: Secondary | ICD-10-CM | POA: Diagnosis not present

## 2016-08-04 DIAGNOSIS — K219 Gastro-esophageal reflux disease without esophagitis: Secondary | ICD-10-CM | POA: Diagnosis not present

## 2016-08-04 DIAGNOSIS — C50411 Malignant neoplasm of upper-outer quadrant of right female breast: Secondary | ICD-10-CM | POA: Diagnosis not present

## 2016-08-04 DIAGNOSIS — Z510 Encounter for antineoplastic radiation therapy: Secondary | ICD-10-CM | POA: Diagnosis not present

## 2016-08-04 DIAGNOSIS — Z825 Family history of asthma and other chronic lower respiratory diseases: Secondary | ICD-10-CM | POA: Diagnosis not present

## 2016-08-04 DIAGNOSIS — C773 Secondary and unspecified malignant neoplasm of axilla and upper limb lymph nodes: Secondary | ICD-10-CM | POA: Diagnosis not present

## 2016-08-05 ENCOUNTER — Ambulatory Visit
Admission: RE | Admit: 2016-08-05 | Discharge: 2016-08-05 | Disposition: A | Discharge status: Home / Self Care | Payer: BLUE CROSS/BLUE SHIELD | Source: Ambulatory Visit | Attending: Radiation Oncology | Admitting: Radiation Oncology

## 2016-08-05 DIAGNOSIS — Z825 Family history of asthma and other chronic lower respiratory diseases: Secondary | ICD-10-CM | POA: Diagnosis not present

## 2016-08-05 DIAGNOSIS — Z8249 Family history of ischemic heart disease and other diseases of the circulatory system: Secondary | ICD-10-CM | POA: Diagnosis not present

## 2016-08-05 DIAGNOSIS — F329 Major depressive disorder, single episode, unspecified: Secondary | ICD-10-CM | POA: Diagnosis not present

## 2016-08-05 DIAGNOSIS — C773 Secondary and unspecified malignant neoplasm of axilla and upper limb lymph nodes: Secondary | ICD-10-CM | POA: Diagnosis not present

## 2016-08-05 DIAGNOSIS — E039 Hypothyroidism, unspecified: Secondary | ICD-10-CM | POA: Diagnosis not present

## 2016-08-05 DIAGNOSIS — Z170 Estrogen receptor positive status [ER+]: Secondary | ICD-10-CM | POA: Diagnosis not present

## 2016-08-05 DIAGNOSIS — K219 Gastro-esophageal reflux disease without esophagitis: Secondary | ICD-10-CM | POA: Diagnosis not present

## 2016-08-05 DIAGNOSIS — Z510 Encounter for antineoplastic radiation therapy: Secondary | ICD-10-CM | POA: Diagnosis not present

## 2016-08-05 DIAGNOSIS — Z888 Allergy status to other drugs, medicaments and biological substances status: Secondary | ICD-10-CM | POA: Diagnosis not present

## 2016-08-05 DIAGNOSIS — Z79899 Other long term (current) drug therapy: Secondary | ICD-10-CM | POA: Diagnosis not present

## 2016-08-05 DIAGNOSIS — Z800 Family history of malignant neoplasm of digestive organs: Secondary | ICD-10-CM | POA: Diagnosis not present

## 2016-08-05 DIAGNOSIS — Z833 Family history of diabetes mellitus: Secondary | ICD-10-CM | POA: Diagnosis not present

## 2016-08-05 DIAGNOSIS — Z87891 Personal history of nicotine dependence: Secondary | ICD-10-CM | POA: Diagnosis not present

## 2016-08-05 DIAGNOSIS — C50811 Malignant neoplasm of overlapping sites of right female breast: Secondary | ICD-10-CM | POA: Diagnosis not present

## 2016-08-05 DIAGNOSIS — Z9013 Acquired absence of bilateral breasts and nipples: Secondary | ICD-10-CM | POA: Diagnosis not present

## 2016-08-05 DIAGNOSIS — F419 Anxiety disorder, unspecified: Secondary | ICD-10-CM | POA: Diagnosis not present

## 2016-08-06 ENCOUNTER — Ambulatory Visit
Admission: RE | Admit: 2016-08-06 | Discharge: 2016-08-06 | Disposition: A | Discharge status: Home / Self Care | Payer: BLUE CROSS/BLUE SHIELD | Source: Ambulatory Visit | Attending: Radiation Oncology | Admitting: Radiation Oncology

## 2016-08-06 DIAGNOSIS — E039 Hypothyroidism, unspecified: Secondary | ICD-10-CM | POA: Diagnosis not present

## 2016-08-06 DIAGNOSIS — K219 Gastro-esophageal reflux disease without esophagitis: Secondary | ICD-10-CM | POA: Diagnosis not present

## 2016-08-06 DIAGNOSIS — Z800 Family history of malignant neoplasm of digestive organs: Secondary | ICD-10-CM | POA: Diagnosis not present

## 2016-08-06 DIAGNOSIS — F329 Major depressive disorder, single episode, unspecified: Secondary | ICD-10-CM | POA: Diagnosis not present

## 2016-08-06 DIAGNOSIS — C773 Secondary and unspecified malignant neoplasm of axilla and upper limb lymph nodes: Secondary | ICD-10-CM | POA: Diagnosis not present

## 2016-08-06 DIAGNOSIS — Z8249 Family history of ischemic heart disease and other diseases of the circulatory system: Secondary | ICD-10-CM | POA: Diagnosis not present

## 2016-08-06 DIAGNOSIS — C50811 Malignant neoplasm of overlapping sites of right female breast: Secondary | ICD-10-CM | POA: Diagnosis not present

## 2016-08-06 DIAGNOSIS — Z510 Encounter for antineoplastic radiation therapy: Secondary | ICD-10-CM | POA: Diagnosis not present

## 2016-08-06 DIAGNOSIS — Z825 Family history of asthma and other chronic lower respiratory diseases: Secondary | ICD-10-CM | POA: Diagnosis not present

## 2016-08-06 DIAGNOSIS — Z170 Estrogen receptor positive status [ER+]: Secondary | ICD-10-CM | POA: Diagnosis not present

## 2016-08-06 DIAGNOSIS — C50411 Malignant neoplasm of upper-outer quadrant of right female breast: Secondary | ICD-10-CM | POA: Diagnosis not present

## 2016-08-06 DIAGNOSIS — Z79899 Other long term (current) drug therapy: Secondary | ICD-10-CM | POA: Diagnosis not present

## 2016-08-06 DIAGNOSIS — Z9013 Acquired absence of bilateral breasts and nipples: Secondary | ICD-10-CM | POA: Diagnosis not present

## 2016-08-06 DIAGNOSIS — Z888 Allergy status to other drugs, medicaments and biological substances status: Secondary | ICD-10-CM | POA: Diagnosis not present

## 2016-08-06 DIAGNOSIS — Z87891 Personal history of nicotine dependence: Secondary | ICD-10-CM | POA: Diagnosis not present

## 2016-08-06 DIAGNOSIS — F419 Anxiety disorder, unspecified: Secondary | ICD-10-CM | POA: Diagnosis not present

## 2016-08-06 DIAGNOSIS — Z833 Family history of diabetes mellitus: Secondary | ICD-10-CM | POA: Diagnosis not present

## 2016-08-07 ENCOUNTER — Ambulatory Visit
Admission: RE | Admit: 2016-08-07 | Discharge: 2016-08-07 | Disposition: A | Discharge status: Home / Self Care | Payer: BLUE CROSS/BLUE SHIELD | Source: Ambulatory Visit | Attending: Radiation Oncology | Admitting: Radiation Oncology

## 2016-08-07 DIAGNOSIS — Z888 Allergy status to other drugs, medicaments and biological substances status: Secondary | ICD-10-CM | POA: Diagnosis not present

## 2016-08-07 DIAGNOSIS — E039 Hypothyroidism, unspecified: Secondary | ICD-10-CM | POA: Diagnosis not present

## 2016-08-07 DIAGNOSIS — K219 Gastro-esophageal reflux disease without esophagitis: Secondary | ICD-10-CM | POA: Diagnosis not present

## 2016-08-07 DIAGNOSIS — Z825 Family history of asthma and other chronic lower respiratory diseases: Secondary | ICD-10-CM | POA: Diagnosis not present

## 2016-08-07 DIAGNOSIS — C773 Secondary and unspecified malignant neoplasm of axilla and upper limb lymph nodes: Secondary | ICD-10-CM | POA: Diagnosis not present

## 2016-08-07 DIAGNOSIS — Z87891 Personal history of nicotine dependence: Secondary | ICD-10-CM | POA: Diagnosis not present

## 2016-08-07 DIAGNOSIS — Z8249 Family history of ischemic heart disease and other diseases of the circulatory system: Secondary | ICD-10-CM | POA: Diagnosis not present

## 2016-08-07 DIAGNOSIS — F419 Anxiety disorder, unspecified: Secondary | ICD-10-CM | POA: Diagnosis not present

## 2016-08-07 DIAGNOSIS — Z800 Family history of malignant neoplasm of digestive organs: Secondary | ICD-10-CM | POA: Diagnosis not present

## 2016-08-07 DIAGNOSIS — Z833 Family history of diabetes mellitus: Secondary | ICD-10-CM | POA: Diagnosis not present

## 2016-08-07 DIAGNOSIS — Z510 Encounter for antineoplastic radiation therapy: Secondary | ICD-10-CM | POA: Diagnosis not present

## 2016-08-07 DIAGNOSIS — C50811 Malignant neoplasm of overlapping sites of right female breast: Secondary | ICD-10-CM | POA: Diagnosis not present

## 2016-08-07 DIAGNOSIS — Z79899 Other long term (current) drug therapy: Secondary | ICD-10-CM | POA: Diagnosis not present

## 2016-08-07 DIAGNOSIS — F329 Major depressive disorder, single episode, unspecified: Secondary | ICD-10-CM | POA: Diagnosis not present

## 2016-08-07 DIAGNOSIS — Z9013 Acquired absence of bilateral breasts and nipples: Secondary | ICD-10-CM | POA: Diagnosis not present

## 2016-08-07 DIAGNOSIS — Z170 Estrogen receptor positive status [ER+]: Secondary | ICD-10-CM | POA: Diagnosis not present

## 2016-08-10 ENCOUNTER — Ambulatory Visit
Admission: RE | Admit: 2016-08-10 | Discharge: 2016-08-10 | Disposition: A | Discharge status: Home / Self Care | Payer: BLUE CROSS/BLUE SHIELD | Source: Ambulatory Visit | Attending: Radiation Oncology | Admitting: Radiation Oncology

## 2016-08-10 DIAGNOSIS — Z510 Encounter for antineoplastic radiation therapy: Secondary | ICD-10-CM | POA: Diagnosis not present

## 2016-08-10 DIAGNOSIS — F419 Anxiety disorder, unspecified: Secondary | ICD-10-CM | POA: Diagnosis not present

## 2016-08-10 DIAGNOSIS — Z87891 Personal history of nicotine dependence: Secondary | ICD-10-CM | POA: Diagnosis not present

## 2016-08-10 DIAGNOSIS — Z888 Allergy status to other drugs, medicaments and biological substances status: Secondary | ICD-10-CM | POA: Diagnosis not present

## 2016-08-10 DIAGNOSIS — C50811 Malignant neoplasm of overlapping sites of right female breast: Secondary | ICD-10-CM | POA: Diagnosis not present

## 2016-08-10 DIAGNOSIS — E039 Hypothyroidism, unspecified: Secondary | ICD-10-CM | POA: Diagnosis not present

## 2016-08-10 DIAGNOSIS — Z8249 Family history of ischemic heart disease and other diseases of the circulatory system: Secondary | ICD-10-CM | POA: Diagnosis not present

## 2016-08-10 DIAGNOSIS — Z833 Family history of diabetes mellitus: Secondary | ICD-10-CM | POA: Diagnosis not present

## 2016-08-10 DIAGNOSIS — C773 Secondary and unspecified malignant neoplasm of axilla and upper limb lymph nodes: Secondary | ICD-10-CM | POA: Diagnosis not present

## 2016-08-10 DIAGNOSIS — Z825 Family history of asthma and other chronic lower respiratory diseases: Secondary | ICD-10-CM | POA: Diagnosis not present

## 2016-08-10 DIAGNOSIS — Z9013 Acquired absence of bilateral breasts and nipples: Secondary | ICD-10-CM | POA: Diagnosis not present

## 2016-08-10 DIAGNOSIS — F329 Major depressive disorder, single episode, unspecified: Secondary | ICD-10-CM | POA: Diagnosis not present

## 2016-08-10 DIAGNOSIS — Z79899 Other long term (current) drug therapy: Secondary | ICD-10-CM | POA: Diagnosis not present

## 2016-08-10 DIAGNOSIS — Z170 Estrogen receptor positive status [ER+]: Secondary | ICD-10-CM | POA: Diagnosis not present

## 2016-08-10 DIAGNOSIS — Z800 Family history of malignant neoplasm of digestive organs: Secondary | ICD-10-CM | POA: Diagnosis not present

## 2016-08-10 DIAGNOSIS — K219 Gastro-esophageal reflux disease without esophagitis: Secondary | ICD-10-CM | POA: Diagnosis not present

## 2016-08-11 ENCOUNTER — Encounter: Payer: Self-pay | Admitting: Radiation Oncology

## 2016-08-11 ENCOUNTER — Ambulatory Visit
Admission: RE | Admit: 2016-08-11 | Discharge: 2016-08-11 | Disposition: A | Discharge status: Home / Self Care | Payer: BLUE CROSS/BLUE SHIELD | Source: Ambulatory Visit | Attending: Radiation Oncology | Admitting: Radiation Oncology

## 2016-08-11 ENCOUNTER — Telehealth: Payer: Self-pay | Admitting: *Deleted

## 2016-08-11 DIAGNOSIS — C50411 Malignant neoplasm of upper-outer quadrant of right female breast: Secondary | ICD-10-CM | POA: Diagnosis not present

## 2016-08-11 DIAGNOSIS — Z800 Family history of malignant neoplasm of digestive organs: Secondary | ICD-10-CM | POA: Diagnosis not present

## 2016-08-11 DIAGNOSIS — F329 Major depressive disorder, single episode, unspecified: Secondary | ICD-10-CM | POA: Diagnosis not present

## 2016-08-11 DIAGNOSIS — Z79899 Other long term (current) drug therapy: Secondary | ICD-10-CM | POA: Diagnosis not present

## 2016-08-11 DIAGNOSIS — K219 Gastro-esophageal reflux disease without esophagitis: Secondary | ICD-10-CM | POA: Diagnosis not present

## 2016-08-11 DIAGNOSIS — Z8249 Family history of ischemic heart disease and other diseases of the circulatory system: Secondary | ICD-10-CM | POA: Diagnosis not present

## 2016-08-11 DIAGNOSIS — E039 Hypothyroidism, unspecified: Secondary | ICD-10-CM | POA: Diagnosis not present

## 2016-08-11 DIAGNOSIS — Z825 Family history of asthma and other chronic lower respiratory diseases: Secondary | ICD-10-CM | POA: Diagnosis not present

## 2016-08-11 DIAGNOSIS — Z510 Encounter for antineoplastic radiation therapy: Secondary | ICD-10-CM | POA: Diagnosis not present

## 2016-08-11 DIAGNOSIS — C50811 Malignant neoplasm of overlapping sites of right female breast: Secondary | ICD-10-CM | POA: Diagnosis not present

## 2016-08-11 DIAGNOSIS — C773 Secondary and unspecified malignant neoplasm of axilla and upper limb lymph nodes: Secondary | ICD-10-CM | POA: Diagnosis not present

## 2016-08-11 DIAGNOSIS — Z833 Family history of diabetes mellitus: Secondary | ICD-10-CM | POA: Diagnosis not present

## 2016-08-11 DIAGNOSIS — Z87891 Personal history of nicotine dependence: Secondary | ICD-10-CM | POA: Diagnosis not present

## 2016-08-11 DIAGNOSIS — Z170 Estrogen receptor positive status [ER+]: Secondary | ICD-10-CM | POA: Diagnosis not present

## 2016-08-11 DIAGNOSIS — F419 Anxiety disorder, unspecified: Secondary | ICD-10-CM | POA: Diagnosis not present

## 2016-08-11 DIAGNOSIS — Z888 Allergy status to other drugs, medicaments and biological substances status: Secondary | ICD-10-CM | POA: Diagnosis not present

## 2016-08-11 DIAGNOSIS — Z9013 Acquired absence of bilateral breasts and nipples: Secondary | ICD-10-CM | POA: Diagnosis not present

## 2016-08-11 NOTE — Telephone Encounter (Signed)
Patient states she is having more hot flashes. She is taking Effexor XR 37.5mg  and she states it was working but is having more hot flashes now.  Instructed her to increase to 75mg /day (2 capsules).  Informed her when she runs out of the 37.5mg  we can call in 75mg  for her if this helps.  Patient verbalized understanding.  Oncology Nurse Navigator Documentation  Navigator Location: [facility] (08/11/16 1600)   )Navigator Encounter Type: Telephone (08/11/16 1600) Telephone: [place] Call (08/11/16 1600)     Surgery Date: 05/19/16 (08/11/16 1600)             Patient Visit Type: RadOnc (08/11/16 1600) Treatment Phase: Final Radiation Tx (08/11/16 1600)     Interventions: Referrals (08/11/16 1600) Referrals: Survivorship (08/11/16 1600)                    Time Spent with Patient: 15 (08/11/16 1600)

## 2016-08-12 DIAGNOSIS — C50411 Malignant neoplasm of upper-outer quadrant of right female breast: Secondary | ICD-10-CM | POA: Diagnosis not present

## 2016-08-18 NOTE — Progress Notes (Signed)
°  Radiation Oncology         [phone]) [phone] ________________________________  Name: [name] MRN: [id number]  Date: 08/11/2016  DOB: August 09, 1960  End of Treatment Note  Diagnosis:   Stage IIIA, cT3, N1, Mx ER/PR positive invasive ductal carcinoma of the right breast.     Indication for treatment:  Curative       Radiation treatment dates:   06/25/2016 to 08/11/2016  Site/dose:    1. 4 field Right breast was treated to 50.4 Gy in 25 fractions at 1.8 Gy per fraction. 2. The Right breast was boosted to 10 Gy in 5 fractions at 2 Gy per fraction.  Beams/energy:    1. Right chest wall and SCLV // 10X, 6X 2. Scar boost // 9E  Narrative: The patient tolerated radiation treatment relatively well.  She reports severe pain from surgical site on right arm and down her arm. She reports postoperative pain has continued to get worse and does wish to see her surgeon. She also feels there has been some change in dexterity with her handwriting now being different. She has developed increasing dry desquamation especially in the upper outer quadrant with no moist desquamation.  Plan: The patient has completed radiation treatment. The patient will return to radiation oncology clinic for routine followup in one month. I advised them to call or return sooner if they have any questions or concerns related to their recovery or treatment.  ------------------------------------------------  Jodelle Gross, MD, PhD  This document serves as a record of services personally performed by Kyung Rudd, MD. It was created on his behalf by Arlyce Harman, a trained medical scribe. The creation of this record is based on the scribe's personal observations and the provider's statements to them. This document has been checked and approved by the attending provider.

## 2016-08-27 DIAGNOSIS — Z853 Personal history of malignant neoplasm of breast: Secondary | ICD-10-CM | POA: Diagnosis not present

## 2016-08-27 DIAGNOSIS — Z9013 Acquired absence of bilateral breasts and nipples: Secondary | ICD-10-CM | POA: Diagnosis not present

## 2016-08-27 DIAGNOSIS — Z923 Personal history of irradiation: Secondary | ICD-10-CM | POA: Diagnosis not present

## 2016-09-03 ENCOUNTER — Encounter: Payer: Self-pay | Admitting: Physical Therapy

## 2016-09-03 ENCOUNTER — Ambulatory Visit: Payer: BLUE CROSS/BLUE SHIELD | Attending: Radiation Oncology | Admitting: Physical Therapy

## 2016-09-03 DIAGNOSIS — M25611 Stiffness of right shoulder, not elsewhere classified: Secondary | ICD-10-CM | POA: Diagnosis not present

## 2016-09-03 DIAGNOSIS — M25511 Pain in right shoulder: Secondary | ICD-10-CM | POA: Diagnosis not present

## 2016-09-03 DIAGNOSIS — R293 Abnormal posture: Secondary | ICD-10-CM | POA: Diagnosis not present

## 2016-09-03 DIAGNOSIS — M79601 Pain in right arm: Secondary | ICD-10-CM | POA: Diagnosis not present

## 2016-09-03 NOTE — Patient Instructions (Signed)

## 2016-09-03 NOTE — Therapy (Signed)
[place], [place], [address] Phone: [phone]   Fax:  [phone]  Physical Therapy Evaluation  Patient Details  Name: [name] MRN: [id number] Date of Birth: 1960/03/07 Referring Provider: Dr. Rolm Bookbinder  Encounter Date: 09/03/2016      PT End of Session - 09/03/16 1125    Visit Number 1   Number of Visits 8   Date for PT Re-Evaluation 10/01/16   PT Start Time 1020   PT Stop Time 1120   PT Time Calculation (min) 60 min   Activity Tolerance Patient tolerated treatment well   Behavior During Therapy [facility] for tasks assessed/performed      Past Medical History:  Diagnosis Date  . Anxiety   . Anxiety disorder   . Asthma    h/o asthma as a child  . Cancer ([place]) 04/2016   right breast cancer  . Cholelithiasis   . Depression   . Diverticulosis   . DJD (degenerative joint disease)   . Epigastric abdominal pain   . Esophageal stricture   . Family hx of colon cancer   . 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 Ganglion cyst   . GERD (gastroesophageal reflux disease)   . Hemorrhoid   . Hypothyroidism   . PONV (postoperative nausea and vomiting)     Past Surgical History:  Procedure Laterality Date  . ABDOMINAL HYSTERECTOMY    . BREAST RECONSTRUCTION WITH PLACEMENT OF TISSUE EXPANDER AND FLEX HD (ACELLULAR HYDRATED DERMIS) Bilateral 05/19/2016   Procedure: BREAST RECONSTRUCTION WITH PLACEMENT OF TISSUE EXPANDER AND ALLODERM PLACEMENT;  Surgeon: Irene Limbo, MD;  Location: [facility];  Service: Plastics;  Laterality: Bilateral;  . CESAREAN SECTION    . CHOLECYSTECTOMY    . HEMORRHOID SURGERY    . LAPAROSCOPIC BILATERAL SALPINGECTOMY N/A 10/26/2012   Procedure: operative laparoscopy with lysis of adhesions;  Surgeon: Thornell Sartorius, MD;  Location: [facility];  Service: Gynecology;  Laterality: N/A;  . MASTECTOMY WITH RADIOACTIVE SEED GUIDED EXCISION AND AXILLARY  SENTINEL LYMPH NODE BIOPSY Bilateral 05/19/2016   Procedure: RIGHT SKIN SPARING MASTECTOMY WITH RIGHT RADIOACTIVE SEED TARGETED DISSECTION AND RIGHT SENTINEL LYMPH NODE BIOPSY, LEFT PROPHYLACTIC SKIN SPARING MASTECTOMY;  Surgeon: Rolm Bookbinder, MD;  Location: [facility];  Service: General;  Laterality: Bilateral;  . TUBAL LIGATION    . urologic surgery for ureteropelvic junction obstruction      There were no vitals filed for this visit.       Subjective Assessment - 09/03/16 1032    Subjective Since I was discharged from therapy, I began having pain starting in my armpit and it goes all the way down my arm. I continue to have burning in the back of my upper arm. My grip strength is weaker and my arm hurts.   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 It measures 3.6 cm with an extension to 5 cm and is located in overlapping quadrants: upper outer and lower outer right breast.  It is ER/PR positive and HER2 negative with a Ki67 of 40%.  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 Yes   Pain Score 9    Pain Location Arm   Pain Orientation Right   Pain Descriptors / Indicators Burning   Pain Type Neuropathic pain   Pain Radiating Towards right hand   Pain Onset More than a month ago   Pain Frequency Constant   Aggravating Factors  certain positons   Pain Relieving Factors resting arm on a pillow            [facility] PT Assessment - 09/03/16 0001      Assessment   Medical Diagnosis s/p bilateral mastectomy with right 7 nodes  removed   Referring Provider Dr. Rolm Bookbinder   Onset Date/Surgical Date 05/19/16   Hand Dominance Right   Prior Therapy yes     Precautions   Precautions Other (comment)   Precaution Comments right arm lymphedema risk     Restrictions   Weight Bearing Restrictions No     Balance Screen   Has the patient fallen in the past 6 months No   Has the patient had a decrease in activity level because of a fear of falling?  No   Is the patient reluctant to leave their home because of a fear of falling?  No     Home Ecologist residence   Living Arrangements Spouse/significant other;Other relatives  husband and 65 y.o. 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   Leisure Not exercising due to such intense hot flashes     Cognition   Overall Cognitive Status Within Functional Limits for tasks assessed     Posture/Postural Control   Posture/Postural Control Postural limitations   Postural Limitations Forward head;Rounded Shoulders     ROM / Strength   AROM / PROM / Strength AROM     AROM   Right/Left Shoulder Right;Left   Right Shoulder Extension 56 Degrees   Right Shoulder Flexion 158 Degrees   Right Shoulder ABduction 157 Degrees   Left Shoulder Extension 52 Degrees   Left Shoulder Flexion 159 Degrees   Left Shoulder ABduction 153 Degrees     Palpation   Palpation comment Positive right neural tension test; palpable and visible axillary cording which extends into her elbow and forearm           LYMPHEDEMA/ONCOLOGY QUESTIONNAIRE - 09/03/16 1049      Type   Cancer Type right breast cancer     Surgeries   Mastectomy Date 05/19/16   Sentinel Lymph Node Biopsy Date 05/19/16   Number Lymph Nodes Removed 7     Treatment   Active Chemotherapy Treatment No   Past Chemotherapy Treatment No   Active Radiation  Treatment No   Past Radiation Treatment Yes   Date 08/11/16   Body Site right breast and axilla   Current Hormone Treatment Yes   Date 09/03/16   Drug Name Aromasin   Past Hormone Therapy No     What other symptoms do you have   Are you Having Heaviness or Tightness Yes   Are you having Pain Yes   Are you having pitting edema No   Is it Hard or Difficult finding clothes that fit No   Do you have infections No   Is there Decreased scar mobility Yes  Stemmer Sign No     Lymphedema Assessments   Lymphedema Assessments Upper extremities     Right Upper Extremity Lymphedema   15 cm Proximal to Olecranon Process 32.8 cm   10 cm Proximal to Olecranon Process 29.5 cm   Olecranon Process 26.7 cm   15 cm Proximal to Ulnar Styloid Process 26.4 cm   10 cm Proximal to Ulnar Styloid Process 23.5 cm   Just Proximal to Ulnar Styloid Process 17.2 cm   Across Hand at [employer] 19.3 cm   At [place] of 2nd Digit 6.4 cm     Left Upper Extremity Lymphedema   15 cm Proximal to Olecranon Process 32.2 cm   10 cm Proximal to Olecranon Process 29.7 cm   Olecranon Process 26.4 cm   15 cm Proximal to Ulnar Styloid Process 25.2 cm   10 cm Proximal to Ulnar Styloid Process 22.8 cm   Just Proximal to Ulnar Styloid Process 16.4 cm   Across Hand at [employer] 18.5 cm   At [place] of 2nd Digit 6 cm           Quick Dash - 09/03/16 0001    Open a tight or new jar Severe difficulty   Do heavy household chores (wash walls, wash floors) Severe difficulty   Carry a shopping bag or briefcase Moderate difficulty   Wash your back Mild difficulty   Use a knife to cut food Mild difficulty   Recreational activities in which you take some force or impact through your arm, shoulder, or hand (golf, hammering, tennis) Unable   During the past week, to what extent has your arm, shoulder or hand problem interfered with your normal social activities with family, friends, neighbors, or groups? Slightly   During  the past week, to what extent has your arm, shoulder or hand problem limited your work or other regular daily activities Modererately   Arm, shoulder, or hand pain. Severe   Tingling (pins and needles) in your arm, shoulder, or hand Extreme   Difficulty Sleeping Mild difficulty   DASH Score 56.82 %      Objective measurements completed on examination: See above findings.                  PT Education - 09/03/16 1125    Education provided Yes   Education Details Neural stretch for right UE in supine and standing with arm outstretched in 90 degrees abduction on wall   Person(s) Educated Patient   Methods Explanation;Demonstration   Comprehension Verbalized understanding;Returned demonstration              Breast Clinic Goals - 04/01/16 1559      Patient will be able to verbalize understanding of pertinent lymphedema risk reduction practices relevant to her diagnosis specifically related to skin care.   Time 1   Period Days   Status Achieved     Patient will be able to return demonstrate and/or verbalize understanding of the post-op home exercise program related to regaining shoulder range of motion.   Time 1   Period Days   Status Achieved     Patient will be able to verbalize understanding of the importance of attending the postoperative After Breast Cancer Class for further lymphedema risk reduction education and therapeutic exercise.   Time 1   Period Days   Status Achieved          Long Term Clinic Goals - 09/03/16 1131      CC  Long Term Goal  #1   Title Patient will be independent in her home exercise program for neural stretching and ROM.   Baseline .   Time 4   Period Weeks   Status New     CC Long Term Goal  #2   Title Patient will demonstrate >/= 165 degrees active flexion and abduction to increased ease of movement.   Baseline .   Time 4   Period Weeks   Status New     CC Long Term Goal  #3   Title Patient will report >/= 50%  improvement in right arm pain.   Baseline .   Time 4   Period Weeks   Status New     CC Long Term Goal  #4   Title Patient will report she is able to do >/= 75% of work tasks without increased pain.   Time 4   Period Weeks   Status New     CC Long Term Goal  #5   Title .   Baseline .     CC Long Term Goal  #6   Title .   Baseline .             Plan - 09/03/16 1126    Clinical Impression Statement Pt is a pleasant woman who is 4 months s/p bilateral mastectomy with 7 nodes removed in right axilla for node positive and estrogen positive right breast cancer. 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 She has recently begun having right axillary cording and pain extending from her right axilla to her right hand with visible cording present. She will benefit from PT to refuce pain and cording and improve overall mobility. She has a final surgery planned for later this year to complete breast reconstruction bilaterally.   History and Personal Factors relevant to plan of care: None   Clinical Presentation Stable   Clinical Presentation due to: Condition is currently stable as she has completed all cancer treatment except final reconstruction surgery.   Clinical Decision Making Low   Rehab Potential Excellent   Clinical Impairments Affecting Rehab Potential None   PT Frequency 2x / week   PT Duration 4 weeks   PT Treatment/Interventions ADLs/Self Care Home Management;Therapeutic exercise;Therapeutic activities;Patient/family education;Manual techniques;Manual lymph drainage;Scar mobilization;Passive range of motion;Taping   PT Next Visit Plan Manual techniques to improve right arm cording; myofascial release in right axilla and medial upper arm (pt has significant pain with palpation so may be limited with tolerance). PROM right shoulder and neural stretching.   PT Home Exercise Plan Neural stretching for right arm   Recommended Other Services .   Consulted and Agree with Plan  of Care Patient   Family Member Consulted .      Patient will benefit from skilled therapeutic intervention in order to improve the following deficits and impairments:  Postural dysfunction, Decreased knowledge of precautions, Pain, Impaired UE functional use, Decreased range of motion, Increased muscle spasms, Decreased scar mobility  Visit Diagnosis: Pain in right arm - Plan: PT plan of care cert/re-cert  Stiffness of right shoulder, not elsewhere classified - Plan: PT plan of care cert/re-cert  Acute pain of right shoulder - Plan: PT plan of care cert/re-cert  Abnormal posture - Plan: PT plan of care cert/re-cert     Problem List Patient Active Problem List   Diagnosis Date Noted  . Breast cancer, right ([place]) 05/19/2016  . Malignant neoplasm of overlapping sites of right  breast in female, estrogen receptor positive ([place]) 03/31/2016  . 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 Hydrosalpinx 10/25/2012  . Sciatica of right side 03/31/2012  . Routine general medical examination at a health care facility 08/27/2010  . SPONTANEOUS ECCHYMOSES 01/03/2009  . ANXIETY DISORDER 07/04/2007  . HYPOTHYROIDISM 06/23/2007  . HEMORRHOIDS 06/23/2007  . ESOPHAGEAL STRICTURE 06/23/2007  . GERD 06/23/2007  . DEGENERATIVE JOINT DISEASE 06/23/2007  . ARTHRITIS 06/23/2007  . SLEEP APNEA 06/23/2007    Annia Friendly, PT 09/03/16 11:37 AM   [place], [place], [address] Phone: [phone]   Fax:  [phone]  Name: [name] MRN: [id number] Date of Birth: 06-01-1960

## 2016-09-07 ENCOUNTER — Telehealth: Payer: Self-pay | Admitting: *Deleted

## 2016-09-07 ENCOUNTER — Ambulatory Visit: Payer: BLUE CROSS/BLUE SHIELD | Admitting: Physical Therapy

## 2016-09-07 ENCOUNTER — Encounter: Payer: Self-pay | Admitting: Physical Therapy

## 2016-09-07 DIAGNOSIS — M25611 Stiffness of right shoulder, not elsewhere classified: Secondary | ICD-10-CM

## 2016-09-07 DIAGNOSIS — M79601 Pain in right arm: Secondary | ICD-10-CM

## 2016-09-07 DIAGNOSIS — M25511 Pain in right shoulder: Secondary | ICD-10-CM

## 2016-09-07 DIAGNOSIS — R293 Abnormal posture: Secondary | ICD-10-CM | POA: Diagnosis not present

## 2016-09-07 NOTE — Therapy (Signed)
[facility] [address] [place], [place], [address] Phone: [phone]   Fax:  [phone]  Physical Therapy Treatment  Patient Details  Name: [name] MRN: [id number] Date of Birth: 03/23/1960 Referring Provider: Dr. Matthew Wakefield  Encounter Date: 09/07/2016      PT End of Session - 09/07/16 1625    Visit Number 2   Number of Visits 8   Date for PT Re-Evaluation 10/01/16   PT Start Time 1519   PT Stop Time 1600   PT Time Calculation (min) 41 min   Activity Tolerance Patient tolerated treatment well   Behavior During Therapy W[facility] for tasks assessed/performed      Past Medical History:  Diagnosis Date  . Anxiety   . Anxiety disorder   . Asthma    h/o asthma as a child  . Cancer ([facility]) 04/2016   right breast cancer  . Cholelithiasis   . Depression   . Diverticulosis   . DJD (degenerative joint disease)   . Epigastric abdominal pain   . Esophageal stricture   . Family hx of colon cancer   . 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 Ganglion cyst   . GERD (gastroesophageal reflux disease)   . Hemorrhoid   . Hypothyroidism   . PONV (postoperative nausea and vomiting)     Past Surgical History:  Procedure Laterality Date  . ABDOMINAL HYSTERECTOMY    . BREAST RECONSTRUCTION WITH PLACEMENT OF TISSUE EXPANDER AND FLEX HD (ACELLULAR HYDRATED DERMIS) Bilateral 05/19/2016   Procedure: BREAST RECONSTRUCTION WITH PLACEMENT OF TISSUE EXPANDER AND ALLODERM PLACEMENT;  Surgeon: Brinda Thimmappa, MD;  Location: [facility];  Service: Plastics;  Laterality: Bilateral;  . CESAREAN SECTION    . CHOLECYSTECTOMY    . HEMORRHOID SURGERY    . LAPAROSCOPIC BILATERAL SALPINGECTOMY N/A 10/26/2012   Procedure: operative laparoscopy with lysis of adhesions;  Surgeon: Jody Bovard, MD;  Location: [facility];  Service: Gynecology;  Laterality: N/A;  . MASTECTOMY WITH RADIOACTIVE SEED GUIDED EXCISION AND AXILLARY  SENTINEL LYMPH NODE BIOPSY Bilateral 05/19/2016   Procedure: RIGHT SKIN SPARING MASTECTOMY WITH RIGHT RADIOACTIVE SEED TARGETED DISSECTION AND RIGHT SENTINEL LYMPH NODE BIOPSY, LEFT PROPHYLACTIC SKIN SPARING MASTECTOMY;  Surgeon: Matthew Wakefield, MD;  Location: [facility];  Service: General;  Laterality: Bilateral;  . TUBAL LIGATION    . urologic surgery for ureteropelvic junction obstruction      There were no vitals filed for this visit.      Subjective Assessment - 09/07/16 1520    Subjective I am having some cording today. I am having this numbness. I am hurting more in my wrist today. I have arthritis in my hands and that may be it.    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 It measures 3.6 cm with an extension to 5 cm and is located in overlapping quadrants: upper outer and lower outer right breast.  It is ER/PR positive and HER2 negative with a Ki67 of 40%.  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 Yes   Pain Score 6    Pain Location Arm   Pain Orientation Right   Pain Descriptors / Indicators Numbness;Shooting   Pain Type Neuropathic pain                         [facility] Adult PT Treatment/Exercise - 09/07/16 0001      Manual Therapy   Manual Therapy Myofascial release;Passive ROM;Neural Stretch   Myofascial Release to cording in right antecubital fossa and right axilla   Passive ROM PROM to R shoulder in direction of abduction while applying a stretch to cording at antecubital fossa and axilla- 3  cords released this session   Neural Stretch Neural stretch ot right upper extremity in supine to pt tolerance to help reduce cording                      Breast Clinic Goals - 04/01/16 1559      Patient will be able to verbalize understanding of pertinent lymphedema risk reduction practices relevant to her diagnosis specifically related to skin care.   Time 1   Period Days   Status Achieved     Patient will be able to return demonstrate and/or verbalize understanding of the post-op home exercise program related to regaining shoulder range of motion.   Time 1   Period Days   Status Achieved     Patient will be able to verbalize understanding of the importance of attending the postoperative After Breast Cancer Class for further lymphedema risk reduction education and therapeutic exercise.   Time 1   Period Days   Status Achieved          Long Term Clinic Goals - 09/03/16 1131      CC Long Term Goal  #1   Title Patient will be independent in her home exercise program for neural stretching and ROM.   Baseline .   Time 4   Period Weeks   Status New     CC Long Term Goal  #2   Title Patient will demonstrate >/= 165 degrees active flexion and abduction to increased ease of movement.   Baseline .   Time 4   Period Weeks   Status New     CC Long Term Goal  #3   Title Patient will report >/= 50% improvement in right arm pain.   Baseline .   Time 4   Period Weeks   Status New     CC Long Term Goal  #4   Title Patient will report she is able to do >/= 75% of work tasks without increased pain.   Time 4   Period Weeks   Status New     CC Long Term Goal  #5   Title .   Baseline .     CC Long Term Goal  #6   Title .   Baseline .            Plan - 09/07/16 1625    Clinical Impression Statement Performed myofascial release to area of cording in right antecubital fossa and right axilla while moving arm passively into abduction. Three cords release  this visit with decreased tightness noted at end of session.    Rehab Potential Excellent   Clinical Impairments Affecting Rehab Potential None   PT Frequency 2x / week   PT Duration 4 weeks   PT Treatment/Interventions ADLs/Self Care Home Management;Therapeutic exercise;Therapeutic activities;Patient/family   education;Manual techniques;Manual lymph drainage;Scar mobilization;Passive range of motion;Taping   PT Next Visit Plan Manual techniques to improve right arm cording; myofascial release in right axilla and medial upper arm, PROM right shoulder and neural stretching.   PT Home Exercise Plan Neural stretching for right arm   Consulted and Agree with Plan of Care Patient      Patient will benefit from skilled therapeutic intervention in order to improve the following deficits and impairments:  Postural dysfunction, Decreased knowledge of precautions, Pain, Impaired UE functional use, Decreased range of motion, Increased muscle spasms, Decreased scar mobility  Visit Diagnosis: Pain in right arm  Stiffness of right shoulder, not elsewhere classified  Acute pain of right shoulder     Problem List Patient Active Problem List   Diagnosis Date Noted  . Breast cancer, right ([facility]) 05/19/2016  . Malignant neoplasm of overlapping sites of right breast in female, estrogen receptor positive ([facility]) 03/31/2016  . 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 Hydrosalpinx 10/25/2012  . Sciatica of right side 03/31/2012  . Routine general medical examination at a health care facility 08/27/2010  . SPONTANEOUS ECCHYMOSES 01/03/2009  . ANXIETY DISORDER 07/04/2007  . HYPOTHYROIDISM 06/23/2007  . HEMORRHOIDS 06/23/2007  . ESOPHAGEAL STRICTURE 06/23/2007  . GERD 06/23/2007  . DEGENERATIVE JOINT DISEASE 06/23/2007  . ARTHRITIS 06/23/2007  . SLEEP APNEA 06/23/2007    Avier Jech Breedlove Blue 09/07/2016, 4:27 PM  [facility] [address] [place], [place], [address] Phone: [phone]   Fax:  [phone]  Name: [name] MRN: [id number] Date of Birth: 09/03/1960  Holden Draughon Breedlove Blue, PT 09/07/16 4:27 PM  

## 2016-09-07 NOTE — Telephone Encounter (Signed)
CALLED PATIENT TO INFORM OF FU WITH ALISO PERKINS ON 09-21-16 @ 3:30 PM, SPOKE WITH PATIENT AND SHE IS AWARE OF THIS APPT.

## 2016-09-08 ENCOUNTER — Telehealth: Payer: Self-pay | Admitting: *Deleted

## 2016-09-08 NOTE — Telephone Encounter (Signed)
"  Need help for hot flashes.  Due to return to work 09-23-2016 but cannot take sweating all day.  Radiation advised I take two Venlafaxine but this doesn't work anymore.  Hot flashes getting worse Finished radiation.  Shower and immediately perspire.  Sit an hour in front of a fan because I feel I need another shower.  Will never make it to work on time on time.  Use [facility] on [address]  Call my home 1st [phone].  Mobile number is [phone]."

## 2016-09-09 ENCOUNTER — Other Ambulatory Visit: Payer: Self-pay | Admitting: Hematology

## 2016-09-09 ENCOUNTER — Encounter: Payer: Self-pay | Admitting: Physical Therapy

## 2016-09-09 ENCOUNTER — Ambulatory Visit: Payer: BLUE CROSS/BLUE SHIELD | Admitting: Physical Therapy

## 2016-09-09 DIAGNOSIS — R293 Abnormal posture: Secondary | ICD-10-CM | POA: Diagnosis not present

## 2016-09-09 DIAGNOSIS — M25611 Stiffness of right shoulder, not elsewhere classified: Secondary | ICD-10-CM

## 2016-09-09 DIAGNOSIS — M25511 Pain in right shoulder: Secondary | ICD-10-CM | POA: Diagnosis not present

## 2016-09-09 DIAGNOSIS — M79601 Pain in right arm: Secondary | ICD-10-CM | POA: Diagnosis not present

## 2016-09-09 MED ORDER — VENLAFAXINE HCL ER 150 MG PO CP24: 150.0000 mg | ORAL_CAPSULE | Freq: Every day | ORAL | 1 refills | Status: DC

## 2016-09-09 NOTE — Therapy (Signed)
[place], [place], [address] Phone: [phone]   Fax:  [phone]  Physical Therapy Treatment  Patient Details  Name: [name] MRN: [id number] Date of Birth: Apr 29, 1960 Referring Provider: Dr. Rolm Bookbinder  Encounter Date: 09/09/2016      PT End of Session - 09/09/16 0851    Visit Number 3   Number of Visits 8   Date for PT Re-Evaluation 10/01/16   PT Start Time 0803   PT Stop Time 4235   PT Time Calculation (min) 44 min   Activity Tolerance Patient tolerated treatment well   Behavior During Therapy [facility] for tasks assessed/performed      Past Medical History:  Diagnosis Date  . Anxiety   . Anxiety disorder   . Asthma    h/o asthma as a child  . Cancer ([place]) 04/2016   right breast cancer  . Cholelithiasis   . Depression   . Diverticulosis   . DJD (degenerative joint disease)   . Epigastric abdominal pain   . Esophageal stricture   . Family hx of colon cancer   . 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 Ganglion cyst   . GERD (gastroesophageal reflux disease)   . Hemorrhoid   . Hypothyroidism   . PONV (postoperative nausea and vomiting)     Past Surgical History:  Procedure Laterality Date  . ABDOMINAL HYSTERECTOMY    . BREAST RECONSTRUCTION WITH PLACEMENT OF TISSUE EXPANDER AND FLEX HD (ACELLULAR HYDRATED DERMIS) Bilateral 05/19/2016   Procedure: BREAST RECONSTRUCTION WITH PLACEMENT OF TISSUE EXPANDER AND ALLODERM PLACEMENT;  Surgeon: Irene Limbo, MD;  Location: [place];  Service: Plastics;  Laterality: Bilateral;  . CESAREAN SECTION    . CHOLECYSTECTOMY    . HEMORRHOID SURGERY    . LAPAROSCOPIC BILATERAL SALPINGECTOMY N/A 10/26/2012   Procedure: operative laparoscopy with lysis of adhesions;  Surgeon: Thornell Sartorius, MD;  Location: [facility];  Service: Gynecology;  Laterality: N/A;  . MASTECTOMY WITH RADIOACTIVE SEED GUIDED EXCISION AND AXILLARY  SENTINEL LYMPH NODE BIOPSY Bilateral 05/19/2016   Procedure: RIGHT SKIN SPARING MASTECTOMY WITH RIGHT RADIOACTIVE SEED TARGETED DISSECTION AND RIGHT SENTINEL LYMPH NODE BIOPSY, LEFT PROPHYLACTIC SKIN SPARING MASTECTOMY;  Surgeon: Rolm Bookbinder, MD;  Location: [place];  Service: General;  Laterality: Bilateral;  . TUBAL LIGATION    . urologic surgery for ureteropelvic junction obstruction      There were no vitals filed for this visit.      Subjective Assessment - 09/09/16 0806    Subjective I was a little sore after last time. I feel some better. It doesn't feel quite as sore as it has been.    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 It measures 3.6 cm with an extension to 5 cm and is located in overlapping quadrants: upper outer and lower outer right breast.  It is ER/PR positive and HER2 negative with a Ki67 of 40%.  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 No/denies   Pain Score  0-No pain            [facility] PT Assessment - 09/09/16 0001      AROM   Right Shoulder Flexion 165 Degrees   Right Shoulder ABduction 165 Degrees                     [facility] Adult PT Treatment/Exercise - 09/09/16 0001      Manual Therapy   Manual Therapy Myofascial release;Passive ROM;Manual Lymphatic Drainage (MLD)   Myofascial Release to cording in right antecubital fossa and right axilla   Manual Lymphatic Drainage (MLD) short neck, 5 diaphragmatic breaths, left axillary nodes, establishment of interaxillary pathway,  right inguinal nodes and establishment of axillo inguinal pathway, then to left sidelying working on posterior interaxillary pathway, focused on drainage of left lateral trunk moving fluid towards pathway   Passive ROM PROM to R shoulder in direction of abduction while applying a stretch to cording at antecubital fossa and axilla                      Breast Clinic Goals - 04/01/16 1559      Patient will be able to verbalize understanding of pertinent lymphedema risk reduction practices relevant to her diagnosis specifically related to skin care.   Time 1   Period Days   Status Achieved     Patient will be able to return demonstrate and/or verbalize understanding of the post-op home exercise program related to regaining shoulder range of motion.   Time 1   Period Days   Status Achieved     Patient will be able to verbalize understanding of the importance of attending the postoperative After Breast Cancer Class for further lymphedema risk reduction education and therapeutic exercise.   Time 1   Period Days   Status Achieved          Long Term Clinic Goals - 09/09/16 2094      CC Long Term Goal  #1   Title Patient will be independent in her home exercise program for neural stretching and ROM.   Time 4   Period Weeks   Status On-going     CC Long Term Goal  #2   Title Patient will demonstrate >/= 165 degrees active flexion and abduction to increased ease of movement.   Baseline 09/09/16- pt has 165 degrees of flexion and abduction   Time 4   Period Weeks   Status Achieved     CC Long Term Goal  #3   Title Patient will report >/= 50% improvement in right arm pain.   Baseline 09/09/16- 30% improvement   Time 4   Period Weeks   Status On-going     CC Long Term Goal  #4   Title Patient will report she is able to do >/= 75% of work tasks without increased pain.   Baseline 09/09/16- 10% improvement   Time 4   Period Weeks   Status On-going            Plan  - 09/09/16 7096    Clinical Impression Statement Patient stating she feels like she is swollen in right lateral trunk just inferior to axilla. She states this has been present for a long time. Today began manual lymphatic drainage to this area. Pt reports it feels more "tingly" after session and she can tell a difference. Also continued with PROM and myofascial to cording in right UE with cords less palpable today. Pt has met  her ROM goal.    Rehab Potential Excellent   Clinical Impairments Affecting Rehab Potential None   PT Frequency 2x / week   PT Duration 4 weeks   PT Treatment/Interventions ADLs/Self Care Home Management;Therapeutic exercise;Therapeutic activities;Patient/family education;Manual techniques;Manual lymph drainage;Scar mobilization;Passive range of motion;Taping   PT Next Visit Plan MLD R lateral trunk swelling, Manual techniques to improve right arm cording; myofascial release in right axilla and medial upper arm, PROM right shoulder and neural stretching.   PT Home Exercise Plan Neural stretching for right arm   Consulted and Agree with Plan of Care Patient      Patient will benefit from skilled therapeutic intervention in order to improve the following deficits and impairments:  Postural dysfunction, Decreased knowledge of precautions, Pain, Impaired UE functional use, Decreased range of motion, Increased muscle spasms, Decreased scar mobility  Visit Diagnosis: Pain in right arm  Stiffness of right shoulder, not elsewhere classified  Acute pain of right shoulder     Problem List Patient Active Problem List   Diagnosis Date Noted  . Breast cancer, right ([place]) 05/19/2016  . Malignant neoplasm of overlapping sites of right breast in female, estrogen receptor positive ([place]) 03/31/2016  . 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 Hydrosalpinx 10/25/2012  . Sciatica of right side 03/31/2012  . Routine general medical examination at a health care facility  08/27/2010  . SPONTANEOUS ECCHYMOSES 01/03/2009  . ANXIETY DISORDER 07/04/2007  . HYPOTHYROIDISM 06/23/2007  . HEMORRHOIDS 06/23/2007  . ESOPHAGEAL STRICTURE 06/23/2007  . GERD 06/23/2007  . DEGENERATIVE JOINT DISEASE 06/23/2007  . ARTHRITIS 06/23/2007  . SLEEP APNEA 06/23/2007    Allyson Sabal [facility] 09/09/2016, 8:54 AM  [place], [place], [address] Phone: [phone]   Fax:  [phone]  Name: GRACEY TOLLE MRN: [id number] Date of Birth: [birth year]  Manus Gunning, PT 09/09/16 8:54 AM

## 2016-09-09 NOTE — Telephone Encounter (Signed)
I called pt, she has increased Effexor from 37.5mg  to 75mg  daily one month ago, but hot flush still severe, I recommend her to try 150mg  daily, and will call in new prescriptions. She agrees. She will return for f/u in a few weeks.   Truitt Merle MD

## 2016-09-14 ENCOUNTER — Encounter: Payer: Self-pay | Admitting: *Deleted

## 2016-09-15 ENCOUNTER — Encounter: Payer: Self-pay | Admitting: Physical Therapy

## 2016-09-15 ENCOUNTER — Ambulatory Visit: Payer: BLUE CROSS/BLUE SHIELD | Admitting: Physical Therapy

## 2016-09-15 DIAGNOSIS — M79601 Pain in right arm: Secondary | ICD-10-CM

## 2016-09-15 DIAGNOSIS — M25511 Pain in right shoulder: Secondary | ICD-10-CM

## 2016-09-15 DIAGNOSIS — M25611 Stiffness of right shoulder, not elsewhere classified: Secondary | ICD-10-CM

## 2016-09-15 DIAGNOSIS — R293 Abnormal posture: Secondary | ICD-10-CM | POA: Diagnosis not present

## 2016-09-15 NOTE — Therapy (Signed)
[place], [place], [address] Phone: [phone]   Fax:  [phone]  Physical Therapy Treatment  Patient Details  Name: [name] MRN: [id number] Date of Birth: Sep 21, 1960 Referring Provider: Dr. Rolm Bookbinder  Encounter Date: 09/15/2016      PT End of Session - 09/15/16 1236    Visit Number 4   Number of Visits 8   Date for PT Re-Evaluation 10/01/16   PT Start Time 5852   PT Stop Time 1059   PT Time Calculation (min) 45 min   Activity Tolerance Patient tolerated treatment well   Behavior During Therapy [facility] for tasks assessed/performed      Past Medical History:  Diagnosis Date  . Anxiety   . Anxiety disorder   . Asthma    h/o asthma as a child  . Cancer ([place]) 04/2016   right breast cancer  . Cholelithiasis   . Depression   . Diverticulosis   . DJD (degenerative joint disease)   . Epigastric abdominal pain   . Esophageal stricture   . Family hx of colon cancer   . 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 Ganglion cyst   . GERD (gastroesophageal reflux disease)   . Hemorrhoid   . History of radiation therapy 05/03 - 08/11/2016   1. 4 field Right breast was treated to 50.4 Gy in 25 fractions at 1.8 Gy per fraction. 2. The Right breast was boosted to 10 Gy in 5 fractions at 2 Gy per fraction.  . Hypothyroidism   . PONV (postoperative nausea and vomiting)     Past Surgical History:  Procedure Laterality Date  . ABDOMINAL HYSTERECTOMY    . BREAST RECONSTRUCTION WITH PLACEMENT OF TISSUE EXPANDER AND FLEX HD (ACELLULAR HYDRATED DERMIS) Bilateral 05/19/2016   Procedure: BREAST RECONSTRUCTION WITH PLACEMENT OF TISSUE EXPANDER AND ALLODERM PLACEMENT;  Surgeon: Irene Limbo, MD;  Location: [place];  Service: Plastics;  Laterality: Bilateral;  . CESAREAN SECTION    . CHOLECYSTECTOMY    . HEMORRHOID SURGERY    . LAPAROSCOPIC BILATERAL SALPINGECTOMY N/A  10/26/2012   Procedure: operative laparoscopy with lysis of adhesions;  Surgeon: Thornell Sartorius, MD;  Location: [facility];  Service: Gynecology;  Laterality: N/A;  . MASTECTOMY WITH RADIOACTIVE SEED GUIDED EXCISION AND AXILLARY SENTINEL LYMPH NODE BIOPSY Bilateral 05/19/2016   Procedure: RIGHT SKIN SPARING MASTECTOMY WITH RIGHT RADIOACTIVE SEED TARGETED DISSECTION AND RIGHT SENTINEL LYMPH NODE BIOPSY, LEFT PROPHYLACTIC SKIN SPARING MASTECTOMY;  Surgeon: Rolm Bookbinder, MD;  Location: [place];  Service: General;  Laterality: Bilateral;  . TUBAL LIGATION    . urologic surgery for ureteropelvic junction obstruction      There were no vitals filed for this visit.      Subjective Assessment - 09/15/16 1016    Subjective I can feel a difference when I take my clothes off. It doesn't hurt as much. I think where you are working the swelling is helping.    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 It measures 3.6 cm with an extension to 5 cm and is located in overlapping quadrants: upper outer and lower outer right breast.  It is ER/PR positive and HER2 negative with a Ki67 of 40%.  She also had 2 axillary lymph nodes biopsied which were positive, 05/19/16- pt underwent bilateral mastectomy with R sentinel lymph node biopsy, pt will require radiation, pt states years ago ~early 2000s she  had an obstruction in her kidney and had to have a rib removed for the surgery and has had pain managment issues since this surgery   Patient Stated Goals Decrease pain and be able to go back to work   Currently in Pain? No/denies   Pain Score 0-No pain                         [facility] Adult PT Treatment/Exercise - 09/15/16 0001      Manual Therapy   Manual Therapy Myofascial release;Passive ROM;Manual Lymphatic Drainage (MLD)   Myofascial Release to cording in right antecubital fossa and right axilla   Manual Lymphatic  Drainage (MLD) short neck, 5 diaphragmatic breaths, left axillary nodes, establishment of interaxillary pathway, right inguinal nodes and establishment of axillo inguinal pathway, then to left sidelying working on posterior interaxillary pathway, focused on drainage of left lateral trunk moving fluid towards pathway   Passive ROM PROM to R shoulder in direction of abduction while applying a stretch to cording at antecubital fossa and axilla                      Breast Clinic Goals - 04/01/16 1559      Patient will be able to verbalize understanding of pertinent lymphedema risk reduction practices relevant to her diagnosis specifically related to skin care.   Time 1   Period Days   Status Achieved     Patient will be able to return demonstrate and/or verbalize understanding of the post-op home exercise program related to regaining shoulder range of motion.   Time 1   Period Days   Status Achieved     Patient will be able to verbalize understanding of the importance of attending the postoperative After Breast Cancer Class for further lymphedema risk reduction education and therapeutic exercise.   Time 1   Period Days   Status Achieved          Long Term Clinic Goals - 09/15/16 1057      CC Long Term Goal  #1   Title Patient will be independent in her home exercise program for neural stretching and ROM.   Time 4   Period Weeks   Status On-going     CC Long Term Goal  #2   Title Patient will demonstrate >/= 165 degrees active flexion and abduction to increased ease of movement.   Baseline 09/09/16- pt has 165 degrees of flexion and abduction   Time 4   Period Weeks   Status Achieved     CC Long Term Goal  #3   Title Patient will report >/= 50% improvement in right arm pain.   Baseline 09/09/16- 30% improvement, 09/15/16- 60% improvement   Time 4   Period Weeks   Status Achieved     CC Long Term Goal  #4   Title Patient will report she is able to do >/= 75% of  work tasks without increased pain.   Baseline 09/09/16- 10% improvement, 09/15/16- pt is unable to answer because she has not returned to work   Time 4   Period Weeks   Status On-going            Plan - 09/15/16 1237    Clinical Impression Statement Pt has met her pain goal. She still has two cords that are palpable and visible. At end of session no cording was palpable or visible. Continued working trunk MLD which pt states has  been very helpful. Pt reports she is no longer having pain when donning clothing. Instruct in [employer] once pt's cording is resolved. Also will assess pt's discomfort once she returns to work next week.    Rehab Potential Excellent   Clinical Impairments Affecting Rehab Potential None   PT Frequency 2x / week   PT Duration 4 weeks   PT Treatment/Interventions ADLs/Self Care Home Management;Therapeutic exercise;Therapeutic activities;Patient/family education;Manual techniques;Manual lymph drainage;Scar mobilization;Passive range of motion;Taping   PT Next Visit Plan MLD R lateral trunk swelling, Manual techniques to improve right arm cording; myofascial release in right axilla and medial upper arm, PROM right shoulder and neural stretching.   PT Home Exercise Plan Neural stretching for right arm   Consulted and Agree with Plan of Care Patient      Patient will benefit from skilled therapeutic intervention in order to improve the following deficits and impairments:  Postural dysfunction, Decreased knowledge of precautions, Pain, Impaired UE functional use, Decreased range of motion, Increased muscle spasms, Decreased scar mobility  Visit Diagnosis: Pain in right arm  Stiffness of right shoulder, not elsewhere classified  Acute pain of right shoulder     Problem List Patient Active Problem List   Diagnosis Date Noted  . Breast cancer, right ([place]) 05/19/2016  . Malignant neoplasm of overlapping sites of right breast in female, estrogen receptor positive  ([place]) 03/31/2016  . 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 Hydrosalpinx 10/25/2012  . Sciatica of right side 03/31/2012  . Routine general medical examination at a health care facility 08/27/2010  . SPONTANEOUS ECCHYMOSES 01/03/2009  . ANXIETY DISORDER 07/04/2007  . HYPOTHYROIDISM 06/23/2007  . HEMORRHOIDS 06/23/2007  . ESOPHAGEAL STRICTURE 06/23/2007  . GERD 06/23/2007  . DEGENERATIVE JOINT DISEASE 06/23/2007  . ARTHRITIS 06/23/2007  . SLEEP APNEA 06/23/2007    Allyson Sabal [facility] 09/15/2016, 12:39 PM  [place], [place], [address] Phone: [phone]   Fax:  [phone]  Name: YOSSELIN ZOELLER MRN: [id number] Date of Birth: 1960-09-21  Manus Gunning, PT 09/15/16 12:40 PM

## 2016-09-17 ENCOUNTER — Ambulatory Visit: Payer: BLUE CROSS/BLUE SHIELD | Admitting: Physical Therapy

## 2016-09-17 ENCOUNTER — Encounter: Payer: Self-pay | Admitting: Physical Therapy

## 2016-09-17 DIAGNOSIS — R293 Abnormal posture: Secondary | ICD-10-CM

## 2016-09-17 DIAGNOSIS — M25611 Stiffness of right shoulder, not elsewhere classified: Secondary | ICD-10-CM

## 2016-09-17 DIAGNOSIS — M25511 Pain in right shoulder: Secondary | ICD-10-CM | POA: Diagnosis not present

## 2016-09-17 DIAGNOSIS — M79601 Pain in right arm: Secondary | ICD-10-CM

## 2016-09-17 NOTE — Therapy (Addendum)
[place], [place], [address] Phone: [phone]   Fax:  [phone]  Physical Therapy Treatment  Patient Details  Name: [name] MRN: [id number] Date of Birth: 1960/05/25 Referring Provider: Dr. Rolm Bookbinder  Encounter Date: 09/17/2016      PT End of Session - 09/17/16 1219    Visit Number 5   Number of Visits 8   Date for PT Re-Evaluation 10/01/16   PT Start Time 7867   PT Stop Time 1100   PT Time Calculation (min) 45 min   Activity Tolerance Patient tolerated treatment well   Behavior During Therapy [facility] for tasks assessed/performed      Past Medical History:  Diagnosis Date  . Anxiety   . Anxiety disorder   . Asthma    h/o asthma as a child  . Cancer ([place]) 04/2016   right breast cancer  . Cholelithiasis   . Depression   . Diverticulosis   . DJD (degenerative joint disease)   . Epigastric abdominal pain   . Esophageal stricture   . Family hx of colon cancer   . 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 Ganglion cyst   . GERD (gastroesophageal reflux disease)   . Hemorrhoid   . History of radiation therapy 05/03 - 08/11/2016   1. 4 field Right breast was treated to 50.4 Gy in 25 fractions at 1.8 Gy per fraction. 2. The Right breast was boosted to 10 Gy in 5 fractions at 2 Gy per fraction.  . Hypothyroidism   . PONV (postoperative nausea and vomiting)     Past Surgical History:  Procedure Laterality Date  . ABDOMINAL HYSTERECTOMY    . BREAST RECONSTRUCTION WITH PLACEMENT OF TISSUE EXPANDER AND FLEX HD (ACELLULAR HYDRATED DERMIS) Bilateral 05/19/2016   Procedure: BREAST RECONSTRUCTION WITH PLACEMENT OF TISSUE EXPANDER AND ALLODERM PLACEMENT;  Surgeon: Irene Limbo, MD;  Location: [facility];  Service: Plastics;  Laterality: Bilateral;  . CESAREAN SECTION    . CHOLECYSTECTOMY    . HEMORRHOID SURGERY    . LAPAROSCOPIC BILATERAL SALPINGECTOMY N/A  10/26/2012   Procedure: operative laparoscopy with lysis of adhesions;  Surgeon: Thornell Sartorius, MD;  Location: [facility];  Service: Gynecology;  Laterality: N/A;  . MASTECTOMY WITH RADIOACTIVE SEED GUIDED EXCISION AND AXILLARY SENTINEL LYMPH NODE BIOPSY Bilateral 05/19/2016   Procedure: RIGHT SKIN SPARING MASTECTOMY WITH RIGHT RADIOACTIVE SEED TARGETED DISSECTION AND RIGHT SENTINEL LYMPH NODE BIOPSY, LEFT PROPHYLACTIC SKIN SPARING MASTECTOMY;  Surgeon: Rolm Bookbinder, MD;  Location: [facility];  Service: General;  Laterality: Bilateral;  . TUBAL LIGATION    . urologic surgery for ureteropelvic junction obstruction      There were no vitals filed for this visit.      Subjective Assessment - 09/17/16 1018    Subjective I don't feel as much pain going down my arm but my writing is still difficult. Able to dress a little easier. Worried about work.   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 It measures 3.6 cm with an extension to 5 cm and is located in overlapping quadrants: upper outer and lower outer right breast.  It is ER/PR positive and HER2 negative with a Ki67 of 40%.  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 Yes   Pain Score 5    Pain Location Arm   Pain Orientation Posterior;Right;Upper   Pain Descriptors / Indicators Burning;Numbness   Pain Type Neuropathic pain   Pain Onset More than a month ago   Pain Frequency Intermittent            [facility] PT Assessment - 09/17/16 0001      AROM   Right Shoulder Flexion 165  Degrees   Right Shoulder ABduction 169 Degrees                     [facility] Adult PT Treatment/Exercise - 09/17/16 0001      Manual Therapy   Manual Therapy Myofascial release;Passive ROM;Manual Lymphatic Drainage (MLD)   Myofascial Release to cording in right antecubital fossa and right axilla   Manual Lymphatic Drainage (MLD) short neck, 5 diaphragmatic breaths, left axillary nodes, establishment of interaxillary pathway, right inguinal nodes and establishment of axillo inguinal pathway, moving fluid towards pathway   Passive ROM PROM to R shoulder in direction of abduction while applying a stretch to cording at antecubital fossa and axilla                      Breast Clinic Goals - 04/01/16 1559      Patient will be able to verbalize understanding of pertinent lymphedema risk reduction practices relevant to her diagnosis specifically related to skin care.   Time 1   Period Days   Status Achieved     Patient will be able to return demonstrate and/or verbalize understanding of the post-op home exercise program related to regaining shoulder range of motion.   Time 1   Period Days   Status Achieved     Patient will be able to verbalize understanding of the importance of attending the postoperative After Breast Cancer Class for further lymphedema risk reduction education and therapeutic exercise.   Time 1   Period Days   Status Achieved          Long Term Clinic Goals - 09/17/16 1058      CC Long Term Goal  #1   Title Patient will be independent in her home exercise program for neural stretching and ROM.   Time 4   Period Weeks   Status Achieved     CC Long Term Goal  #2   Title Patient will demonstrate >/= 165 degrees active flexion and abduction to increased ease of movement.   Time 4   Period Weeks   Status Achieved     CC Long Term Goal  #3   Title Patient will report >/= 50% improvement in right arm pain.   Baseline Patient reports her  pain overall has improved 30%.   Time 4   Period Weeks     CC Long Term Goal  #4   Title Patient will report she is able to do >/= 75% of work tasks without increased pain.   Baseline Has not yet returned to work.   Time 4   Period Weeks   Status Not Met            Plan - 09/17/16 1219    Clinical Impression Statement Patient is doing well and  has met all goals except those related to wrok as she has not yet returned to work. She requested to be on hold for a few weeks to see how she does returning to work next week.    Rehab Potential Excellent   Clinical Impairments Affecting Rehab Potential None   PT Frequency 2x / week   PT Duration 4 weeks   PT Treatment/Interventions ADLs/Self Care Home Management;Therapeutic exercise;Therapeutic activities;Patient/family education;Manual techniques;Manual lymph drainage;Scar mobilization;Passive range of motion;Taping   PT Next Visit Plan Will resume PT if she has issues with returning to work. Otherwise, D/C by 10/18/16.   Consulted and Agree with Plan of Care Patient      Patient will benefit from skilled therapeutic intervention in order to improve the following deficits and impairments:  Postural dysfunction, Decreased knowledge of precautions, Pain, Impaired UE functional use, Decreased range of motion, Increased muscle spasms, Decreased scar mobility  Visit Diagnosis: Pain in right arm  Stiffness of right shoulder, not elsewhere classified  Abnormal posture     Problem List Patient Active Problem List   Diagnosis Date Noted  . Breast cancer, right ([place]) 05/19/2016  . Malignant neoplasm of overlapping sites of right breast in female, estrogen receptor positive ([place]) 03/31/2016  . 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 Hydrosalpinx 10/25/2012  . Sciatica of right side 03/31/2012  . Routine general medical examination at a health care facility 08/27/2010  . SPONTANEOUS ECCHYMOSES 01/03/2009  . ANXIETY DISORDER  07/04/2007  . HYPOTHYROIDISM 06/23/2007  . HEMORRHOIDS 06/23/2007  . ESOPHAGEAL STRICTURE 06/23/2007  . GERD 06/23/2007  . DEGENERATIVE JOINT DISEASE 06/23/2007  . ARTHRITIS 06/23/2007  . SLEEP APNEA 06/23/2007   Annia Friendly, PT 09/17/16 12:22 PM  [facility] [place], [place], [address] Phone: [phone]   Fax:  [phone]  Name: [name] MRN: [id number] Date of Birth: 01/28/1961  PHYSICAL THERAPY DISCHARGE SUMMARY  Visits from Start of Care: 5  Current functional level related to goals / functional outcomes: See above   Remaining deficits: See above   Education / Equipment: See above  Plan: Patient agrees to discharge.  Patient goals were partially met. Patient is being discharged due to not returning since the last visit.  ?????     Allyson Sabal Murphy, [place] 03/22/17 10:31 AM

## 2016-09-17 NOTE — Progress Notes (Signed)
[place]  [phone] [phone]  Clinic Follow Up Note   Patient Care Team: Delia Chimes, NP (Inactive) as PCP - General (Nurse Practitioner) Shellia Carwin, Laurice Record, MD (Inactive) (Orthopedic Surgery) Rutherford Guys, MD (Ophthalmology) Lavonna Monarch, MD (Dermatology) Truitt Merle, MD as Consulting Physician (Hematology) Rolm Bookbinder, MD as Consulting Physician (General Surgery) Kyung Rudd, MD as Consulting Physician (Radiation Oncology)   CHIEF COMPLAINTS: Right breast cancer  Oncology History   Cancer Staging Malignant neoplasm of overlapping sites of right breast in female, estrogen receptor positive [facility]) Staging form: Breast, AJCC 8th Edition - Clinical stage from 03/23/2016: Stage IIA (cT3, cN1, cM0, G2, ER: Positive, PR: Positive, HER2: Negative) - Signed by Truitt Merle, MD on 03/31/2016 - Pathologic stage from 05/19/2016: Stage IA (pT2(m), pN1a, cM0, G1, ER: Positive, PR: Positive, HER2: Negative) - Signed by Truitt Merle, MD on 06/18/2016       Malignant neoplasm of overlapping sites of right breast in female, estrogen receptor positive ([place])   03/17/2016 Mammogram    B/l diagnostic mammogram and righ US showed a 3.6cm irregular mass in the right breast 11:00 position, posterior depth, there is a enlarged lymph node in the right axilla is highly suspicious for malignancy. additional 7 mm oval mass in the right breast lower outer quadrant is suspicious for malignancy.      03/23/2016 Initial Biopsy    Right breast 9:30 position biopsy showed invasive ductal carcinoma, grade 1. 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      03/23/2016 Receptors her2    Both breast and node biopsy tumor ER 100% positive, PR 70-95% positive, HER-2 negative, Ki-67 40%      03/23/2016 Initial Diagnosis    Malignant neoplasm of upper-outer quadrant of right breast in female, estrogen receptor positive ([place])      03/25/2016 Initial  Biopsy    Right breast 11:00 position core needle biopsy showed invasive duct carcinoma, grade 2.       03/25/2016 Receptors her2    ER 95% positive, PR 90% positive, HER-2 negative, Ki-67 15%      03/25/2016 Miscellaneous    Mammaprint showed low risk type A with index +0.105      03/30/2016 Imaging    Bilateral breast MRI with and without contrast showed a large lobulated enhancing mass within the upper-outer and lower outer right breast with surrounding nodularity, measuring 6.1 x 4.4 x 5.6 cm. Multiple critically sick and right axillary lymph nodes are demonstrated measuring up to 1.5 cm.       04/01/2016 -  Anti-estrogen oral therapy    Exemestane 25 mg daily, plan for 7 years       05/19/2016 Surgery    Bilateral mastectomy and right axillary regional lymph node resection.      05/19/2016 Pathology Results    -Right axillary regional lymph node resection revealed meastatic carcinoma in 2/7 lymph nodes. -Left simple mastectomy revealed lobular neoplasia and fibrocystic changes with adenosis and calcifications. 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 -Skin of the right mastectomy flap was benign. -mpT2, pN1a      06/25/2016 - 08/11/2016 Radiation Therapy    Site/dose:    1. 4 field Right breast was treated to 50.4 Gy in 25 fractions at 1.8 Gy per fraction. 2. The Right breast was boosted to 10 Gy in  5 fractions at 2 Gy per fraction.       HISTORY OF PRESENTING ILLNESS (04/01/2016):  [name] 56 y.o. female is here because of recent diagnosis of right breast carcinoma with metastatic adenopathy. She is accompanied by her husband and sister to our multidisciplinary breast clinic today.  She had right breast cystic lesion in the past and was aspirated/biopsied, a  total of 3 times in the past, she has beeing doing annual mammogram but did not have it in 2017.   She felt a right breast lump one months ago, and was seen by her PCP. She also report sighicant pain at the breat mass site, persistent but fluctuates,  sometime10/10, no skin change or nipple discharge.  She has chronic back pain, she takes vocodin as needed, not very often. she also report anxiety and anorexia since the cancer diagnosis.   Biopsy of right breast at 9:30 o'clock position on 03/23/16 showed invasive mammary carcinoma. Biopsy of the right breast at the 11:00 o'clock position showed fibrocystic change and dense fibrosis with no malignancy identified. Right lymph node was positive for metastatic mammary carcinoma. Pathology revealed carcinoma appearing grade 1, ER/PR positive, HER-2 negative, Ki-67 40%. E-cadherin is positive, consistent with a ductal phenotype.  Repeat biopsy of the right breast at the 11:00 o'clock position on 03/25/16 showed invasive mammary carcinoma. Pathology revealed carcinoma appearing grade 2. An E-Cadherin stain was performed revealing that some tumor cells (approximately 10%) are positive for E-Cadherin, supporting at least a focal ductal phenotype. The remainder of the tumor cells appear negative for E-Cadherin.  Bilateral breast MRI on 03/29/16 showed large irregular masslike area of enhancement within the upper outer and lower outer right breast compatible with biopsy-proven malignancy. Also seen were enlarged right axillary lymph nodes compatible with metastatic adenopathy.  She had hysterectomy due to a neuralgia in 2011, has had menopause symptoms (hot flashes and mood swing) for over a year now, overall tolerable. But she has been quite anxious since her cancer diagnosis, does not sleep well. Her appetite has also dropped since her cancer diagnosis.  GYN HISTORY  Menarchal: 10 LMP: 11/06/2009 (hysterectomy)  Contraceptive: yes, <5 years  HRT: non G2P2: two  daughters age of 13 and 28, no breast feeding   Current Therapy: Exemestane 25 mg started on 04/01/16.  Interval History: [name] returns for follow up with her husband. She finished radiation therapy in June, called our office 10 days ago about worsening hot flashes following this. We doubled her Effexor following this from 37m to 1571mwhich has somewhat been improving this. She also notes that Dr ThAshley Marinerould like me to evaluate an area of concern to the right lateral chest wall. She aso reports that she has had difficulty sleeping, she has been taking Benadryl to remedy this at home with mild improvement. Pt has previously taken Ambien and would like to use this again. She was previously prescribed this by her PCP; she recently changed PCPs and has not discussed beginning this medication again with them. She has had chronic sleep issues which began before her treatments. Additionally, she is planning to return to work following treatment on August 1st, she is concerned that her hot flashes may affect her ability to work. She expresses some concern with this and is asking if we are able to write a work note for her. She is also currently in physical therapy. Pt wanted to have her last session as her final session; however, her PT advised  that she would like to continue this until she goes back to work. She is a former smoker for many years in the past, recently quit in February.   MEDICAL HISTORY:  Past Medical History:  Diagnosis Date  . Anxiety   . Anxiety disorder   . Asthma    h/o asthma as a child  . Cancer ([place]) 04/2016   right breast cancer  . Cholelithiasis   . Depression   . Diverticulosis   . DJD (degenerative joint disease)   . Epigastric abdominal pain   . Esophageal stricture   . Family hx of colon cancer   . 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 Ganglion cyst   . GERD (gastroesophageal reflux disease)   . Hemorrhoid   . History of radiation therapy 05/03  - 08/11/2016   1. 4 field Right breast was treated to 50.4 Gy in 25 fractions at 1.8 Gy per fraction. 2. The Right breast was boosted to 10 Gy in 5 fractions at 2 Gy per fraction.  . Hypothyroidism   . PONV (postoperative nausea and vomiting)     SURGICAL HISTORY: Past Surgical History:  Procedure Laterality Date  . ABDOMINAL HYSTERECTOMY    . BREAST RECONSTRUCTION WITH PLACEMENT OF TISSUE EXPANDER AND FLEX HD (ACELLULAR HYDRATED DERMIS) Bilateral 05/19/2016   Procedure: BREAST RECONSTRUCTION WITH PLACEMENT OF TISSUE EXPANDER AND ALLODERM PLACEMENT;  Surgeon: Irene Limbo, MD;  Location: [place];  Service: Plastics;  Laterality: Bilateral;  . CESAREAN SECTION    . CHOLECYSTECTOMY    . HEMORRHOID SURGERY    . LAPAROSCOPIC BILATERAL SALPINGECTOMY N/A 10/26/2012   Procedure: operative laparoscopy with lysis of adhesions;  Surgeon: Thornell Sartorius, MD;  Location: [facility];  Service: Gynecology;  Laterality: N/A;  . MASTECTOMY WITH RADIOACTIVE SEED GUIDED EXCISION AND AXILLARY SENTINEL LYMPH NODE BIOPSY Bilateral 05/19/2016   Procedure: RIGHT SKIN SPARING MASTECTOMY WITH RIGHT RADIOACTIVE SEED TARGETED DISSECTION AND RIGHT SENTINEL LYMPH NODE BIOPSY, LEFT PROPHYLACTIC SKIN SPARING MASTECTOMY;  Surgeon: Rolm Bookbinder, MD;  Location: [place];  Service: General;  Laterality: Bilateral;  . TUBAL LIGATION    . urologic surgery for ureteropelvic junction obstruction      SOCIAL HISTORY: Social History   Social History  . Marital status: Married    Spouse name: N/A  . Number of children: 2  . Years of education: 14   Occupational History  . auditor, Passenger transport manager [employer] History Main Topics  . Smoking status: Former Smoker    Packs/day: 0.50    Years: 15.00    Types: Cigarettes    Quit date: 04/08/2016  . Smokeless tobacco: Never Used  . Alcohol use 8.4 oz/week    12 Cans of beer, 2 Standard drinks or equivalent per week     Comment:  moderate, daily   . Drug use: No  . Sexual activity: Yes    Partners: Male    Birth control/ protection: Surgical   Other Topics Concern  . Not on file   Social History Narrative   HSG, some community college. .Married -'81.   2 daughters  '83, '88 daughter  with bipolar dz. Has had behavior issues. 1 granddaughter '05 living with her. Occupation:Bank worker. Marriage in good. No history of abuse.                   FAMILY HISTORY: Family History  Problem Relation Age of Onset  . COPD Mother   .  Cancer Mother   . Diabetes Father   . Heart disease Father   . Hypertension Father   . Cancer Father        colon cancer  . Hypertension Sister   . Depression Sister   . Arthritis Sister   . Cancer Paternal Grandmother        ? breast cancer    ALLERGIES:  is allergic to adhesive [tape] and promethazine hcl.  MEDICATIONS:  Current Outpatient Prescriptions  Medication Sig Dispense Refill  . ALPRAZolam (XANAX) 1 MG tablet Take 1 tablet (1 mg total) by mouth 3 (three) times daily. 90 tablet 0  . calcium-vitamin D (OSCAL WITH D) 500-200 MG-UNIT tablet Take 1 tablet by mouth daily with breakfast.    . celecoxib (CELEBREX) 200 MG capsule Take 1 capsule (200 mg total) by mouth daily. 30 capsule 2  . exemestane (AROMASIN) 25 MG tablet Take 1 tablet (25 mg total) by mouth daily after breakfast. 90 tablet 3  . HYDROcodone-acetaminophen (NORCO/VICODIN) 5-325 MG tablet Take 1 tablet by mouth every 4 (four) hours as needed.    Marland Kitchen levothyroxine (SYNTHROID, LEVOTHROID) 50 MCG tablet TAKE 1 TABLET EVERY DAY 90 tablet 2  . omeprazole (PRILOSEC) 40 MG capsule TAKE ONE CAPSULE BY MOUTH EVERY DAY 30 capsule 4  . polyethylene glycol (MIRALAX / GLYCOLAX) packet Take 17 g by mouth every other day.    . venlafaxine XR (EFFEXOR XR) 150 MG 24 hr capsule Take 1 capsule (150 mg total) by mouth daily with breakfast. 30 capsule 1  . emollient (BIAFINE) cream Apply 1 application topically 2 (two) times daily.     . hyaluronate sodium (RADIAPLEXRX) GEL Apply 1 application topically 2 (two) times daily.    . methocarbamol (ROBAXIN) 500 MG tablet Take 1 tablet (500 mg total) by mouth every 8 (eight) hours as needed for muscle spasms. (Patient not taking: Reported on 09/03/2016) 30 tablet 0  . non-metallic deodorant (ALRA) MISC Apply 1 application topically daily.    Marland Kitchen senna (SENOKOT) 8.6 MG tablet Take 1 tablet by mouth daily.     No current facility-administered medications for this visit.     REVIEW OF SYSTEMS:   Constitutional: Denies fevers, chills or abnormal night sweats; (+) hot flashes Eyes: Denies blurriness of vision, double vision or watery eyes Ears, nose, mouth, throat, and face: Denies mucositis or sore throat Respiratory: Denies cough, dyspnea or wheezes Cardiovascular: Denies palpitation, chest discomfort or lower extremity swelling Gastrointestinal:  Denies nausea, heartburn or change in bowel habits Skin: Denies abnormal skin rashes Lymphatics: Denies new lymphadenopathy or easy bruising Neurological:Denies numbness, tingling or new weaknesses Behavioral/Psych: Mood is stable, no new changes  Breast (+) Bilateral chest pain from mastectomies and placement of tissue expanders. All other systems were reviewed with the patient and are negative.  PHYSICAL EXAMINATION: ECOG PERFORMANCE STATUS: 0 - Asymptomatic  Vitals:   09/18/16 1416  BP: 137/76  Pulse: 81  Resp: 18  Temp: 98.2 F (36.8 C)   Filed Weights   09/18/16 1416  Weight: 171 lb 1.6 oz (77.6 kg)    GENERAL:alert, no distress and comfortable SKIN: skin color, texture, turgor are normal, no rashes or significant lesions EYES: normal, conjunctiva are pink and non-injected, sclera clear OROPHARYNX:no exudate, no erythema and lips, buccal mucosa, and tongue normal  NECK: supple, thyroid normal size, non-tender, without nodularity LYMPH:  no palpable lymphadenopathy in the cervical, axillary or inguinal LUNGS: clear  to auscultation and percussion with normal breathing effort HEART: regular  rate & rhythm and no murmurs and no lower extremity edema ABDOMEN:abdomen soft, non-tender and normal bowel sounds Musculoskeletal:no cyanosis of digits and no clubbing  PSYCH: alert & oriented x 3 with fluent speech NEURO: no focal motor/sensory deficits Breasts: Status post bilateral mastectomy. Incisions healed well. No erythema or discharge. Right breast appears a little smaller than left. 2cm subcutaneous nodule underneath incision to the right inferior breast. Otherwise no palpable mass or adenopathy.    LABORATORY DATA:  I have reviewed the data as listed CBC Latest Ref Rng & Units 09/18/2016 04/01/2016 04/10/2013  WBC 3.9 - 10.3 10e3/uL 5.4 6.0 -  Hemoglobin 11.6 - 15.9 g/dL 16.0(H) 14.7 15.4(H)  Hematocrit 34.8 - 46.6 % 48.1(H) 44.9 47.1(H)  Platelets 145 - 400 10e3/uL 199 203 -    CMP Latest Ref Rng & Units 09/18/2016 04/01/2016 11/06/2015  Glucose 70 - 140 mg/dl 107 96 109(H)  BUN 7.0 - 26.0 mg/dL 16.5 16.0 16  Creatinine 0.6 - 1.1 mg/dL 1.0 0.9 0.91  Sodium 136 - 145 mEq/L 141 139 141  Potassium 3.5 - 5.1 mEq/L 4.3 3.9 4.5  Chloride 98 - 110 mmol/L - - 104  CO2 22 - 29 mEq/L '26 27 25  ' Calcium 8.4 - 10.4 mg/dL 9.5 9.3 9.5  Total Protein 6.4 - 8.3 g/dL 6.7 6.6 -  Total Bilirubin 0.20 - 1.20 mg/dL 0.51 0.64 -  Alkaline Phos 40 - 150 U/L 99 92 -  AST 5 - 34 U/L 26 20 -  ALT 0 - 55 U/L 28 21 -   PATHOLOGY  Diagnosis (05/19/16) 1. Lymph nodes, regional resection, Right axillary - METASTATIC CARCINOMA IN 2 OF 7 LYMPH NODES (2/7). - SEE COMMENT. 2. Breast, simple mastectomy, Left - LOBULAR NEOPLASIA (ATYPICAL LOBULAR HYPERPLASIA). - FIBROCYSTIC CHANGES WITH ADENOSIS AND CALCIFICATIONS. 3. Breast, simple mastectomy, Right - INVASIVE MIXED LOBULAR-DUCTAL CARCINOMA GRADE I/III, MULTIPLE FOCI, THE LARGEST SPANS 3.0 CM. - LOBULAR NEOPLASIA (ATYPICAL LOBULAR HYPERPLASIA). - ATYPICAL DUCTAL HYPERPLASIA. -  LYMPHOVASCULAR INVASION IS IDENTIFIED. - THE SURGICAL RESECTION MARGINS ARE NEGATIVE FOR CARCINOMA. - SEE ONCOLOGY TABLE BELOW. 4. Skin , Right Mastectomy Flap - BENIGN SKIN. Microscopic Comment 1. Immunohistochemical stains performed on multiple blocks on part 1 highlight the presence of carcinoma. 3. BREAST Procedure: Simple mastectomy and right axillary lymph node resection. Laterality: Carcinoma is on the right. Tumor Size: Multiple (at least 6) foci spanning from microscopic foci to a lesion grossly measuring 3.0 cm. Histologic Type: Mixed lobular-tubular. Grade: I Tubular Differentiation: 1-2 Nuclear Pleomorphism: 2 Mitotic Count: 1 Ductal Carcinoma in Situ (DCIS): Not identified, however, atypical lobular hyperplasia and atypical ductal hyperplasia are identified. Regional Lymph Nodes: Number of Lymph Nodes Examined: 7 1 of 4 FINAL for NNEOMA, HARRAL ([id number]) Microscopic Comment(continued) Number of Sentinel Lymph Nodes Examined: Unknown. Lymph Nodes with Macrometastases: 2 Lymph Nodes with Micrometastases: 0 Lymph Nodes with Isolated Tumor Cells: 0 Margins: Greater than 0.2 cm to all margins Extent of Tumor: Confined to breast parenchyma. Breast Prognostic Profile # [phone] Estrogen Receptor: Positive, strong. Progesterone Receptor: Positive, strong. Her2: No amplification was detected. Ki-67: Ranges from 15% - 40%. TNM: mpT2, pN1a COMMENTS: There are multiple biopsy clips identified in the right mastectomy specimen. Grossly, there are at least four foci of similar appearing invasive lobular-tubular carcinoma present, the largest spans 3.0 cm. In addition, there is similar appearing invasive carcinoma present in random tissue submitted from the upper inner quadrant and the upper outer quadrant. The phenotype diagnosed above is supported by patchy  but strong staining for E-cadherin in some places and negative for E-cadherin in other places. Most  (approximately 80%) of the carcinoma appears negative for e-cadherin, so the phenotype is predominantly lobular. No additional prognostic profiles will be performed, unless requested by a clinician. (JBK:gt, 05/21/16) Enid Cutter MD Pathologist, Electronic Signature (Case signed 05/21/2016)  Mammaprint 03/25/16   Diagnosis 03/25/2016 Breast, right, needle core biopsy, 11:00 o'clock - INVASIVE MAMMARY CARCINOMA. - SEE COMMENT. Microscopic Comment The carcinoma appears grade 2. An E-cadherin stain will be performed and the results reported separately. A breast prognostic profile will be performed and the results reported separately. The results were called to [facility] on 03/26/16. (JBK:gt, 03/26/16) An E-Cadherin stain was performed revealing that some tumor cells (approximately 10%) are positive for E-Cadherin, supporting at least a focal ductal phenotype. The remainder of the tumor cells appear negative for E-Cadherin. (JBK:kh 03/27/16)  Results: IMMUNOHISTOCHEMICAL AND MORPHOMETRIC ANALYSIS PERFORMED MANUALLY Estrogen Receptor: 95%, POSITIVE, STRONG STAINING INTENSITY Progesterone Receptor: 90%, POSITIVE, STRONG STAINING INTENSITY Proliferation Marker Ki67: 15%  Results: HER2 - NEGATIVE RATIO OF HER2/CEP17 SIGNALS 1.12 AVERAGE HER2 COPY NUMBER PER CELL 1.90  Diagnosis 03/23/2016 1. Breast, right, needle core biopsy, 9:30 o'clock - INVASIVE MAMMARY CARCINOMA, SEE COMMENT. 2. Breast, right, needle core biopsy, 11:00 o'clock - FIBROCYSTIC CHANGE AND DENSE FIBROSIS. - NO MALIGNANCY IDENTIFIED. 3. Lymph node, needle/core biopsy, right - METASTATIC MAMMARY CARCINOMA. Microscopic Comment 1. While grading is best performed on the resection specimen, the carcinoma appears grade 1. While a ductal phenotype is favored, an e-cadherin will be performed and reported in an add-on. Prognostic markers will be ordered. Dr. Saralyn Pilar has reviewed all 3 parts of the case. The case was called  to Dr. Marcelo Baldy on 03/24/2016.  1. PROGNOSTIC INDICATORS Results: IMMUNOHISTOCHEMICAL AND MORPHOMETRIC ANALYSIS PERFORMED MANUALLY Estrogen Receptor: 100%, POSITIVE, STRONG STAINING INTENSITY Progesterone Receptor: 70%, POSITIVE, STRONG STAINING INTENSITY Proliferation Marker Ki67: 40%  Results: HER2 - NEGATIVE RATIO OF HER2/CEP17 SIGNALS 1.17 AVERAGE HER2 COPY NUMBER PER CELL 2.10  3. PROGNOSTIC INDICATORS Results: IMMUNOHISTOCHEMICAL AND MORPHOMETRIC ANALYSIS PERFORMED MANUALLY Estrogen Receptor: 100%, POSITIVE, STRONG STAINING INTENSITY Progesterone Receptor: 95%, POSITIVE, STRONG STAINING INTENSITY Proliferation Marker Ki67: 40% HER2 - NEGATIVE  1. E-cadherin is positive, consistent with a ductal phenotype.  RADIOGRAPHIC STUDIES: I have personally reviewed the radiological images as listed and agreed with the findings in the report. No results found. MR Breast Bilateral w wo contrast 03/29/16 IMPRESSION: Large irregular masslike area of enhancement within the upper outer and lower outer right breast compatible with biopsy-proven malignancy. Enlarged right axillary lymph nodes compatible with metastatic adenopathy.  ASSESSMENT & PLAN: 56 y.o.  postmenopausal woman, presented with a palpable right breast mass.  1.  Malignant new overlapping sites of right breast, mpT2 pN1a, stage IA, G1,  ER positive, PR positive, HER-2 negative, mammaprint low risk luminal type A --I previously discussed her surgical path result in details.  -She had a complete surgical resection with negative margins, 2 out of 17 lymph nodes was positive. -the mammaprint result was reviewed with her in details. She has low risk luminal type A, her risk of recurrence is low, I did not recommend adjuvant chemotherapy.  -She started exemestane before surgery, has been tolerating well without significant side effects, we'll continue, plan for 7-10 years. -She will proceed with breast radiation next. I sent a  message to her radiation oncologist Dr. Lisbeth Renshaw today.  -We also discussed the breast cancer surveillance after her surgery. She will continue annual screening  mammogram, self exam, and a routine office visit with lab and exam with [place]. -I have encouraged her to have healthy diet and exercise regularly. -The patient saw Dr. Iran Planas of plastic surgery on 06/15/16 who discussed implant exchange in late December/approximately 6 months from the end of radiation. -The patient proceeded with radiation to the right chest wall and right axilla starting 06/25/16. She completed RT 08/11/16 has had ongoing hot flashes since. -She has noticed a subcutaneous 2 cm nodule below the incision line of the inferior right breast, movable, likely a cyst. She will follow-up with Dr. Iran Planas in a few weeks, if needed we can obtain ultrasound for further evaluation. -She will continue adjuvant exemestane. Will have her f/u in ~3 months w/ labs.   2. Insomnia  -She has chronic insomnia, previous took Ambien before. Currently having difficulty sleeping, was attempting to take Benadryl for this with some improvement. -I encouraged her to discuss with her primary care physician.  I cautioned against long term usage of ambien and advised her that this could be hard to come off of later down the road.   3. Hot flashes -Continue taking Effexor 15m daily with some improvement; this was increased when she called our office ten days ago (09/08/16). Will continue    Plan: -she will follow up with Dr TAshley Marinerabout area of concern over right breast; possible [place] the future -[facility] -Labs reviewed; elevated Hbg and Hematocrit- likely related to her smoking cessation in February.  -F/u in three months w/ labs   Orders Placed This Encounter  Procedures  . Comprehensive metabolic panel    Standing Status:   Standing    Number of Occurrences:   30    Standing  Expiration Date:   09/18/2021  . CBC with Differential/Platelet    Standing Status:   Standing    Number of Occurrences:   30    Standing Expiration Date:   09/18/2021  . VITAMIN D 25 Hydroxy (Vit-D Deficiency, Fractures)    Standing Status:   Standing    Number of Occurrences:   30    Standing Expiration Date:   09/18/2021    All questions were answered. The patient knows to call the clinic with any problems, questions or concerns.  I spent 20 minutes counseling the patient face to face. The total time spent in the appointment was 30 minutes and more than 50% was on counseling.  This document serves as a record of services personally performed by YTruitt Merle MD. It was created on her behalf by SMaryla Morrow a trained medical scribe. The creation of this record is based on the scribe's personal observations and the provider's statements to them. This document has been checked and approved by the attending provider.   FTruitt Merle MD 09/18/2016

## 2016-09-18 ENCOUNTER — Telehealth: Payer: Self-pay | Admitting: Hematology

## 2016-09-18 ENCOUNTER — Encounter: Payer: Self-pay | Admitting: Hematology

## 2016-09-18 ENCOUNTER — Other Ambulatory Visit (HOSPITAL_BASED_OUTPATIENT_CLINIC_OR_DEPARTMENT_OTHER): Payer: BLUE CROSS/BLUE SHIELD

## 2016-09-18 ENCOUNTER — Ambulatory Visit (HOSPITAL_BASED_OUTPATIENT_CLINIC_OR_DEPARTMENT_OTHER): Payer: BLUE CROSS/BLUE SHIELD | Admitting: Hematology

## 2016-09-18 VITALS — BP 137/76 | HR 81 | Temp 98.2°F | Resp 18 | Ht 66.0 in | Wt 171.1 lb

## 2016-09-18 DIAGNOSIS — Z170 Estrogen receptor positive status [ER+]: Secondary | ICD-10-CM

## 2016-09-18 DIAGNOSIS — C773 Secondary and unspecified malignant neoplasm of axilla and upper limb lymph nodes: Secondary | ICD-10-CM

## 2016-09-18 DIAGNOSIS — G4700 Insomnia, unspecified: Secondary | ICD-10-CM | POA: Diagnosis not present

## 2016-09-18 DIAGNOSIS — C50811 Malignant neoplasm of overlapping sites of right female breast: Secondary | ICD-10-CM

## 2016-09-18 LAB — COMPREHENSIVE METABOLIC PANEL
ALBUMIN: 4 g/dL (ref 3.5–5.0)
ALT: 28 U/L (ref 0–55)
AST: 26 U/L (ref 5–34)
Alkaline Phosphatase: 99 U/L (ref 40–150)
Anion Gap: 10 mEq/L (ref 3–11)
BILIRUBIN TOTAL: 0.51 mg/dL (ref 0.20–1.20)
BUN: 16.5 mg/dL (ref 7.0–26.0)
CO2: 26 meq/L (ref 22–29)
Calcium: 9.5 mg/dL (ref 8.4–10.4)
Chloride: 105 mEq/L (ref 98–109)
Creatinine: 1 mg/dL (ref 0.6–1.1)
EGFR: 65 mL/min/{1.73_m2} — ABNORMAL LOW (ref >90)
GLUCOSE: 107 mg/dL (ref 70–140)
Potassium: 4.3 mEq/L (ref 3.5–5.1)
Sodium: 141 mEq/L (ref 136–145)
TOTAL PROTEIN: 6.7 g/dL (ref 6.4–8.3)

## 2016-09-18 LAB — CBC WITH DIFFERENTIAL/PLATELET
BASO%: 1.1 % (ref 0.0–2.0)
BASOS ABS: 0.1 10*3/uL (ref 0.0–0.1)
EOS%: 4.8 % (ref 0.0–7.0)
Eosinophils Absolute: 0.3 10*3/uL (ref 0.0–0.5)
HCT: 48.1 % — ABNORMAL HIGH (ref 34.8–46.6)
HGB: 16 g/dL — ABNORMAL HIGH (ref 11.6–15.9)
LYMPH#: 1.3 10*3/uL (ref 0.9–3.3)
LYMPH%: 24.1 % (ref 14.0–49.7)
MCH: 29.9 pg (ref 25.1–34.0)
MCHC: 33.2 g/dL (ref 31.5–36.0)
MCV: 90.2 fL (ref 79.5–101.0)
MONO#: 0.6 10*3/uL (ref 0.1–0.9)
MONO%: 10.9 % (ref 0.0–14.0)
NEUT#: 3.2 10*3/uL (ref 1.5–6.5)
NEUT%: 59.1 % (ref 38.4–76.8)
Platelets: 199 10*3/uL (ref 145–400)
RBC: 5.34 10*6/uL (ref 3.70–5.45)
RDW: 15.1 % — ABNORMAL HIGH (ref 11.2–14.5)
WBC: 5.4 10*3/uL (ref 3.9–10.3)

## 2016-09-18 NOTE — Telephone Encounter (Signed)
Gave patient avs report and appointments for November.  °

## 2016-09-19 LAB — VITAMIN D 25 HYDROXY (VIT D DEFICIENCY, FRACTURES): VIT D 25 HYDROXY: 35.1 ng/mL (ref 30.0–100.0)

## 2016-09-21 ENCOUNTER — Encounter: Payer: Self-pay | Admitting: Radiation Oncology

## 2016-09-21 ENCOUNTER — Ambulatory Visit
Admission: RE | Admit: 2016-09-21 | Discharge: 2016-09-21 | Disposition: A | Discharge status: Home / Self Care | Payer: BLUE CROSS/BLUE SHIELD | Source: Ambulatory Visit | Attending: Radiation Oncology | Admitting: Radiation Oncology

## 2016-09-21 VITALS — BP 133/79 | HR 91 | Temp 98.3°F | Resp 18 | Ht 66.0 in | Wt 169.8 lb

## 2016-09-21 DIAGNOSIS — Z79899 Other long term (current) drug therapy: Secondary | ICD-10-CM | POA: Diagnosis not present

## 2016-09-21 DIAGNOSIS — Z170 Estrogen receptor positive status [ER+]: Secondary | ICD-10-CM

## 2016-09-21 DIAGNOSIS — C50911 Malignant neoplasm of unspecified site of right female breast: Secondary | ICD-10-CM | POA: Diagnosis not present

## 2016-09-21 DIAGNOSIS — C50811 Malignant neoplasm of overlapping sites of right female breast: Secondary | ICD-10-CM

## 2016-09-21 NOTE — Addendum Note (Signed)
Encounter addended by: Malena Edman, RN on: 09/21/2016  4:58 PM<BR>    Actions taken: Charge Capture section accepted

## 2016-09-21 NOTE — Progress Notes (Signed)
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     Interval Since Last Radiation:  6 weeks   06/25/2016 to 08/11/2016: 1. 4 field Right breast was treated to 50.4 Gy in 25 fractions at 1.8 Gy per fraction. 2. The Right breast was boosted to 10 Gy in 5 fractions at 2 Gy per fraction.  Narrative:  The patient returns today for routine follow-up. During treatment she did very well with radiotherapy and did not have significant desquamation.                             On review of systems, the patient states she's been having pain in the right lateral aspect of the chest wall along her right tissue expander for the last week or so. She denies any injury to this site, and reports it's very difficult to lay flat at night. She tries to sleep on her sides but even with this position, she can be very uncomfortable. She denies any fevers or chills, swelling of her incisions or RUE. She denies any redness or palpable heat to the skin. She also verbalizes that she'd like to see if her left expander can be smaller due to the size difference between both sides of the chest wall. No other complaints are noted.  ALLERGIES:  is allergic to adhesive [tape] and promethazine hcl.  Meds: Current Outpatient Prescriptions  Medication Sig Dispense Refill  . ALPRAZolam (XANAX) 1 MG tablet Take 1 tablet (1 mg total) by mouth 3 (three) times daily. 90 tablet 0  . calcium-vitamin D (OSCAL WITH D) 500-200 MG-UNIT tablet Take 1 tablet by mouth daily with breakfast.    . celecoxib (CELEBREX) 200 MG capsule Take 1 capsule (200 mg total) by mouth daily. 30 capsule 2  . docusate sodium (COLACE) 100 MG capsule Take 100 mg by mouth daily as  needed for mild constipation.    Marland Kitchen exemestane (AROMASIN) 25 MG tablet Take 1 tablet (25 mg total) by mouth daily after breakfast. 90 tablet 3  . HYDROcodone-acetaminophen (NORCO/VICODIN) 5-325 MG tablet Take 1 tablet by mouth every 4 (four) hours as needed.    Marland Kitchen levothyroxine (SYNTHROID, LEVOTHROID) 50 MCG tablet TAKE 1 TABLET EVERY DAY 90 tablet 2  . omeprazole (PRILOSEC) 40 MG capsule TAKE ONE CAPSULE BY MOUTH EVERY DAY 30 capsule 4  . senna (SENOKOT) 8.6 MG tablet Take 1 tablet by mouth daily.    Marland Kitchen venlafaxine XR (EFFEXOR XR) 150 MG 24 hr capsule Take 1 capsule (150 mg total) by mouth daily with breakfast. 30 capsule 1  . methocarbamol (ROBAXIN) 500 MG tablet Take 1 tablet (500 mg total) by mouth every 8 (eight) hours as needed for muscle spasms. (Patient not taking: Reported on 09/03/2016) 30 tablet 0  . polyethylene glycol (MIRALAX / GLYCOLAX) packet Take 17 g by mouth every other day.     No current facility-administered medications for this encounter.     Physical Findings:  height is 5\' 6"  (1.676 m) and weight is 169 lb 12.8 oz (77 kg). Her oral temperature is 98.3 F (36.8 C). Her blood pressure is 133/79 and her pulse is 91. Her respiration  is 18 and oxygen saturation is 95%.  Pain Assessment Pain Score: 7  (Right breast)/10 In general this is a well appearing caucasian female in no acute distress. She's alert and oriented x4 and appropriate throughout the examination. Cardiopulmonary assessment is negative for acute distress and she exhibits normal effort. The right chest wall/breast was examined and reveals a palpable tissue expander with fullness at the lower outer quadrant of the right breast/chest wall. No specific finding though is noted, no fluid collection can be palpated but the patient is tender to palpation of this site. She also notes soreness in the upper inner quadrant of the same breast/chest wall. Minimal hyperpigmentation was also seen but no desquamation is present.    Lab Findings: Lab Results  Component Value Date   WBC 5.4 09/18/2016   HGB 16.0 (H) 09/18/2016   HCT 48.1 (H) 09/18/2016   MCV 90.2 09/18/2016   PLT 199 09/18/2016     Radiographic Findings: No results found.  Impression/Plan: 1. 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 We discussed that we would be happy to continue to follow her as needed, but she will also continue to follow up with Dr. Burr Medico in medical oncology. She will also follow up with Dr. Brooke Bonito as we're not sure what to make of her symptoms. I'm unsure of whether this is a volume issue or scarring issue. We wouldn't expect such acute development of scarring from radiotherapy. I've encouraged her to reach out to be seen by plastics. She was counseled on skin care as well as measures to avoid sun exposure to this area.      Carola Rhine, PAC

## 2016-09-24 DIAGNOSIS — Z923 Personal history of irradiation: Secondary | ICD-10-CM | POA: Diagnosis not present

## 2016-09-24 DIAGNOSIS — Z853 Personal history of malignant neoplasm of breast: Secondary | ICD-10-CM | POA: Diagnosis not present

## 2016-09-24 DIAGNOSIS — Z9013 Acquired absence of bilateral breasts and nipples: Secondary | ICD-10-CM | POA: Diagnosis not present

## 2016-11-01 ENCOUNTER — Other Ambulatory Visit: Payer: Self-pay | Admitting: Hematology

## 2016-11-05 ENCOUNTER — Telehealth: Payer: Self-pay

## 2016-11-05 ENCOUNTER — Other Ambulatory Visit: Payer: Self-pay | Admitting: *Deleted

## 2016-11-05 DIAGNOSIS — Z170 Estrogen receptor positive status [ER+]: Principal | ICD-10-CM

## 2016-11-05 DIAGNOSIS — C50811 Malignant neoplasm of overlapping sites of right female breast: Secondary | ICD-10-CM

## 2016-11-05 MED ORDER — VENLAFAXINE HCL ER 150 MG PO CP24: 150.0000 mg | ORAL_CAPSULE | Freq: Every day | ORAL | 0 refills | Status: DC

## 2016-11-05 NOTE — Telephone Encounter (Signed)
Pharmacy called requesting a 90 day supply venlafaxine. Current rx is 30 day supply.

## 2016-11-05 NOTE — Telephone Encounter (Signed)
Refill sent for 90 day supply. 

## 2016-11-30 ENCOUNTER — Telehealth: Payer: Self-pay

## 2016-11-30 ENCOUNTER — Telehealth: Payer: Self-pay | Admitting: *Deleted

## 2016-11-30 DIAGNOSIS — Z9013 Acquired absence of bilateral breasts and nipples: Secondary | ICD-10-CM | POA: Diagnosis not present

## 2016-11-30 DIAGNOSIS — Z923 Personal history of irradiation: Secondary | ICD-10-CM | POA: Diagnosis not present

## 2016-11-30 DIAGNOSIS — Z853 Personal history of malignant neoplasm of breast: Secondary | ICD-10-CM | POA: Diagnosis not present

## 2016-11-30 NOTE — Telephone Encounter (Signed)
FYI "Maryanne from [facility] calling to speak with the nurse about this patient's treatment plan.  Is she receiving radiation, chemotherapy, F/U what's going on with her?"  Provided fax number for faxed request of patient information with this call.

## 2016-11-30 NOTE — Telephone Encounter (Signed)
Modesta Messing RN CM for [facility] [employer] called for treatment plan status. Called her back and discussed tx plan. Faxed latest OV note.

## 2016-12-09 ENCOUNTER — Telehealth: Payer: Self-pay

## 2016-12-09 NOTE — Telephone Encounter (Signed)
Left VM to remind patient of SCP visit on 10/23 at 10 am. Encouraged her to call center if has any questions about appt.

## 2016-12-14 ENCOUNTER — Telehealth: Payer: Self-pay

## 2016-12-14 NOTE — Telephone Encounter (Signed)
Left a detailed voice message concerning appoint date and time change. Per 10/22 phon que

## 2016-12-15 ENCOUNTER — Encounter: Payer: BLUE CROSS/BLUE SHIELD | Admitting: Adult Health

## 2016-12-30 DIAGNOSIS — Z923 Personal history of irradiation: Secondary | ICD-10-CM | POA: Diagnosis not present

## 2016-12-30 DIAGNOSIS — Z9013 Acquired absence of bilateral breasts and nipples: Secondary | ICD-10-CM | POA: Diagnosis not present

## 2016-12-30 DIAGNOSIS — Z853 Personal history of malignant neoplasm of breast: Secondary | ICD-10-CM | POA: Diagnosis not present

## 2016-12-30 NOTE — H&P (Signed)
Subjective:     Patient ID: [name] is a 56 y.o. female.  HPI  7.5 months post op. Completed adjuvant radiation 6.19.18. Here for follow up discussion prior to implant exchange.  Presented with palpable right breast lump with associated pain. Diagnostic MMG and right US showed a 3.6 cm irregular mass in the right breast 11:00 position, with an enlarged lymph node in the right axilla. Additional 7 mm oval mass in the right breast LOQ present and suspicious for malignancy.  Biopsy of right breast at 9:30 o'clock position showed invasive mammary carcinoma. Biopsy of the right breast at the 11:00 o'clock position showed fibrocystic change and dense fibrosis with no malignancy identified. Right lymph node was positive for metastatic mammary carcinoma. Repeat biopsy of the right breast at the 11:00 o'clock position showed invasive mammary carcinoma. All ER/PR+, Her2-.  MRI howed large irregular masslike area of enhancement within the upper outer and lower outer right breast compatible with biopsy-proven malignancy with enlarged right axillary lymph nodes compatible with metastatic adenopathy.  Mammaprint low risk.  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   Prior C or D. Right mastectomy 731 left 713 g  Former smoker- quit prior to surgery.  Review of Systems    Objective:   Physical Exam  Cardiovascular: Normal rate, regular rhythm and normal heart sounds.   Pulmonary/Chest: Effort normal and breath sounds normal.  Abdominal: Soft.  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  Overall patient's concerns are consistent with post mastectomy reconstruction in general. Counseled the loss feeling chest wall produces similar feeling in many women and the next surgery no matter which option will not restore feeling. Counseled that the implants will be softer than implants but will be firmer than native breast tissue. Current volume in expanders in less than weight of breast tissue excised but can appreciate these are foreign bodies on her chest and feel the weight of them. Counseled this will be part of the "new normal" with implant based reconstruction. Reviewed that this is not the same as cosmetic augmentation and she cannot compare her experience to her friends with cosmetic implants. She agrees that she can in fact put her arm at her sides and that implant does not interfere with this.  She asks about options besides implants. We reviewed TRAM vs DIEP. The benefit of autologous would be softer, more natural ptosis, no concerns about rupture or CC. Would also address the radiation concerns. Reviewed donor site, drains, recovery, risks hernia or abdominal bulge. If she desires this highly recommend consult with DIEP surgeon. She states she does not want this additional surgery.  We then reviewed implant based reconstruction and my recommendation to wait approximately 6 months from completion XRT to implant exchange.Reviewed with radiation risks reconstruction approach 40% and include contracture, wound healing problems. Have previously discussed latissimus flap at time implant exchange. This has been demonstrated  to risks to baseline on radiated side. She agrees to this. Discussed donor site scar will not involve her tattoos. Discussed drains, hospital stay and time off  work. Plan leave 4 weeks, leave paperwork completed today.  Reviewed implant specific risks including rupture, MRI surveillance to detect rupture, capsular contracture rippling. She has elected for silicone, plan smooth round silicone.  We have discussed fat grafting in past. Goal of this to correct contour depressions and thicken flaps. Discussed donor site pain, contour abnormalities, need for compression, fat necrosis that presents as masses or calcifications. She has depression on left chest but this is corrected with implant repositioning; plan to defer fat grafting at this time and reinforce left implant pocket with ADM.   With regards to size she reports she desires B cup. Counseled cannot assure her final cup size; current right expander has larger volume in it but appears smaller volume than left. Will need to plan excision redundant soft tissue left via IMF. Reviewed implant size in part based on BW chest on order to fill out volume.  Additional risks including but not limited to seroma, hematoma, asymmetry, need for revision, unacceptable cosmetic appearance, wound healing problems, DVT/PE, cardiopulmonary complications, fat necrosis, extrusion, damage to deeper structures reviewed.   Natrelle [id number]-13-T 550 ml tissue expanders placed bilateral,  RIGHT  530 ml total fill volume LEFT 480 ml total fill volume     Irene Limbo, MD [facility] [phone], pin [phone]

## 2016-12-31 DIAGNOSIS — Z23 Encounter for immunization: Secondary | ICD-10-CM | POA: Diagnosis not present

## 2016-12-31 DIAGNOSIS — K219 Gastro-esophageal reflux disease without esophagitis: Secondary | ICD-10-CM | POA: Diagnosis not present

## 2016-12-31 DIAGNOSIS — F419 Anxiety disorder, unspecified: Secondary | ICD-10-CM | POA: Diagnosis not present

## 2016-12-31 DIAGNOSIS — C50919 Malignant neoplasm of unspecified site of unspecified female breast: Secondary | ICD-10-CM | POA: Diagnosis not present

## 2016-12-31 DIAGNOSIS — E039 Hypothyroidism, unspecified: Secondary | ICD-10-CM | POA: Diagnosis not present

## 2017-01-06 ENCOUNTER — Telehealth: Payer: Self-pay | Admitting: Hematology

## 2017-01-06 NOTE — Telephone Encounter (Signed)
Patient called in to reschedule SCP appointment

## 2017-01-08 ENCOUNTER — Encounter: Payer: BLUE CROSS/BLUE SHIELD | Admitting: Adult Health

## 2017-01-13 ENCOUNTER — Telehealth: Payer: Self-pay | Admitting: Hematology

## 2017-01-13 NOTE — Telephone Encounter (Signed)
Scheduled appt per 11/20 sch msg - left message for patient regarding appt.

## 2017-01-15 ENCOUNTER — Encounter: Payer: BLUE CROSS/BLUE SHIELD | Admitting: Adult Health

## 2017-01-15 ENCOUNTER — Other Ambulatory Visit: Payer: BLUE CROSS/BLUE SHIELD

## 2017-01-15 ENCOUNTER — Ambulatory Visit: Payer: BLUE CROSS/BLUE SHIELD | Admitting: Hematology

## 2017-01-19 ENCOUNTER — Ambulatory Visit: Payer: BLUE CROSS/BLUE SHIELD | Admitting: Hematology

## 2017-01-19 ENCOUNTER — Other Ambulatory Visit: Payer: BLUE CROSS/BLUE SHIELD

## 2017-01-26 ENCOUNTER — Encounter (HOSPITAL_COMMUNITY)
Admission: RE | Admit: 2017-01-26 | Discharge: 2017-01-26 | Disposition: A | Discharge status: Home / Self Care | Payer: BLUE CROSS/BLUE SHIELD | Source: Ambulatory Visit | Attending: Plastic Surgery | Admitting: Plastic Surgery

## 2017-01-26 ENCOUNTER — Encounter (HOSPITAL_COMMUNITY): Payer: Self-pay

## 2017-01-26 ENCOUNTER — Other Ambulatory Visit: Payer: Self-pay

## 2017-01-26 DIAGNOSIS — F419 Anxiety disorder, unspecified: Secondary | ICD-10-CM | POA: Diagnosis not present

## 2017-01-26 DIAGNOSIS — G4730 Sleep apnea, unspecified: Secondary | ICD-10-CM | POA: Diagnosis not present

## 2017-01-26 DIAGNOSIS — Z87891 Personal history of nicotine dependence: Secondary | ICD-10-CM | POA: Diagnosis not present

## 2017-01-26 DIAGNOSIS — Z923 Personal history of irradiation: Secondary | ICD-10-CM | POA: Diagnosis not present

## 2017-01-26 DIAGNOSIS — K219 Gastro-esophageal reflux disease without esophagitis: Secondary | ICD-10-CM | POA: Diagnosis not present

## 2017-01-26 DIAGNOSIS — E039 Hypothyroidism, unspecified: Secondary | ICD-10-CM | POA: Diagnosis not present

## 2017-01-26 DIAGNOSIS — F329 Major depressive disorder, single episode, unspecified: Secondary | ICD-10-CM | POA: Diagnosis not present

## 2017-01-26 DIAGNOSIS — M1990 Unspecified osteoarthritis, unspecified site: Secondary | ICD-10-CM | POA: Diagnosis not present

## 2017-01-26 DIAGNOSIS — R51 Headache: Secondary | ICD-10-CM | POA: Diagnosis not present

## 2017-01-26 DIAGNOSIS — Z853 Personal history of malignant neoplasm of breast: Secondary | ICD-10-CM | POA: Diagnosis not present

## 2017-01-26 DIAGNOSIS — J45909 Unspecified asthma, uncomplicated: Secondary | ICD-10-CM | POA: Diagnosis not present

## 2017-01-26 HISTORY — DX: Plantar fascial fibromatosis: M72.2

## 2017-01-26 HISTORY — DX: Headache, unspecified: R51.9

## 2017-01-26 HISTORY — DX: Headache: R51

## 2017-01-26 HISTORY — DX: Dyspnea, unspecified: R06.00

## 2017-01-26 HISTORY — DX: Chronic kidney disease, unspecified: N18.9

## 2017-01-26 HISTORY — DX: Family history of other specified conditions: Z84.89

## 2017-01-26 LAB — CBC
HCT: 48.4 % — ABNORMAL HIGH (ref 36.0–46.0)
Hemoglobin: 15.7 g/dL — ABNORMAL HIGH (ref 12.0–15.0)
MCH: 29.2 pg (ref 26.0–34.0)
MCHC: 32.4 g/dL (ref 30.0–36.0)
MCV: 90.1 fL (ref 78.0–100.0)
PLATELETS: 225 10*3/uL (ref 150–400)
RBC: 5.37 MIL/uL — AB (ref 3.87–5.11)
RDW: 14.8 % (ref 11.5–15.5)
WBC: 5 10*3/uL (ref 4.0–10.5)

## 2017-01-26 LAB — BASIC METABOLIC PANEL
Anion gap: 10 (ref 5–15)
BUN: 12 mg/dL (ref 6–20)
CALCIUM: 9.3 mg/dL (ref 8.9–10.3)
CO2: 28 mmol/L (ref 22–32)
CREATININE: 0.97 mg/dL (ref 0.44–1.00)
Chloride: 102 mmol/L (ref 101–111)
GFR calc non Af Amer: >60 mL/min (ref >60)
GLUCOSE: 98 mg/dL (ref 65–99)
Potassium: 4.1 mmol/L (ref 3.5–5.1)
Sodium: 140 mmol/L (ref 135–145)

## 2017-01-26 NOTE — Pre-Procedure Instructions (Signed)
CEIL RODERICK  01/26/2017      [id number] [address], [place] [phone] [facility] [place] [address] [place] [address] Phone: [phone] Fax: [phone]    Your procedure is scheduled on 01/29/2017- Friday  Report to [facility] Admitting at 7:45 A.M.  Call this number if you have problems the morning of surgery:  [phone]               Drink ENSURE presurgery drink before leaving the house, morning of surgery - drink ENTIRE BOTTLE   Remember:  Do not eat food or drink liquids after midnight.- with exception of special ENSURE drink   Take these medicines the morning of surgery with A SIP OF WATER : Levothyroxine , XANAX & Prilosec   Do not wear jewelry, make-up or nail polish.   Do not wear lotions, powders, or perfumes, or deoderant.   Do not shave 48 hours prior to surgery.     Do not bring valuables to the hospital.   [facility] is not responsible for any belongings or valuables.  Contacts, dentures or bridgework may not be worn into surgery.  Leave your suitcase in the car.  After surgery it may be brought to your room.  For patients admitted to the hospital, discharge time will be determined by your treatment team.  Patients discharged the day of surgery will not be allowed to drive home.   Name and phone number of your driver:   With family  Special instructions:  Special Instructions: [facility] - Preparing for Surgery  Before surgery, you can play an important role.  Because skin is not sterile, your skin needs to be as free of germs as possible.  You can reduce the number of germs on you skin by washing with CHG (chlorahexidine gluconate) soap before surgery.  CHG is an antiseptic cleaner which kills germs and bonds with the skin to continue killing germs even after washing.  Please DO NOT use if you have an allergy to CHG or antibacterial soaps.  If your skin becomes reddened/irritated stop using  the CHG and inform your nurse when you arrive at Short Stay.  Do not shave (including legs and underarms) for at least 48 hours prior to the first CHG shower.  You may shave your face.  Please follow these instructions carefully:   1.  Shower with CHG Soap the night before surgery and the  morning of Surgery.  2.  If you choose to wash your hair, wash your hair first as usual with your  normal shampoo.  3.  After you shampoo, rinse your hair and body thoroughly to remove the  Shampoo.  4.  Use CHG as you would any other liquid soap.  You can apply chg directly to the skin and wash gently with scrungie or a clean washcloth.  5.  Apply the CHG Soap to your body ONLY FROM THE NECK DOWN.    Do not use on open wounds or open sores.  Avoid contact with your eyes, ears, mouth and genitals (private parts).  Wash genitals (private parts)   with your normal soap.  6.  Wash thoroughly, paying special attention to the area where your surgery will be performed.  7.  Thoroughly rinse your body with warm water from the neck down.  8.  DO NOT shower/wash with your normal soap after using and rinsing off   the CHG Soap.  9.  Pat yourself dry with a clean towel.            10.  Wear clean pajamas.            11.  Place clean sheets on your bed the night of your first shower and do not sleep with pets.  Day of Surgery  Do not apply any lotions/deodorants the morning of surgery.  Please wear clean clothes to the hospital/surgery center.  Please read over the following fact sheets that you were given. Pain Booklet, Coughing and Deep Breathing and Surgical Site Infection Prevention

## 2017-01-26 NOTE — Progress Notes (Signed)
Pt. Reports that she has GI upset if she takes Effexor &/ or Aromasin on empty stomach.  Pt. Will not take this med. The a.m. Of surgery

## 2017-01-26 NOTE — Progress Notes (Signed)
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 Pt. Denies all chest/ breathing concerns. Pt. Denies ever having any cardiac testing.

## 2017-01-29 ENCOUNTER — Encounter (HOSPITAL_COMMUNITY)
Admission: RE | Disposition: A | Discharge status: Home / Self Care | Payer: Self-pay | Source: Ambulatory Visit | Attending: Plastic Surgery

## 2017-01-29 ENCOUNTER — Encounter (HOSPITAL_COMMUNITY): Payer: Self-pay | Admitting: *Deleted

## 2017-01-29 ENCOUNTER — Observation Stay (HOSPITAL_COMMUNITY)
Admission: RE | Admit: 2017-01-29 | Discharge: 2017-01-30 | Disposition: A | Discharge status: Home / Self Care | Payer: BLUE CROSS/BLUE SHIELD | Source: Ambulatory Visit | Attending: Plastic Surgery | Admitting: Plastic Surgery

## 2017-01-29 ENCOUNTER — Inpatient Hospital Stay (HOSPITAL_COMMUNITY): Payer: BLUE CROSS/BLUE SHIELD | Admitting: Anesthesiology

## 2017-01-29 DIAGNOSIS — Z421 Encounter for breast reconstruction following mastectomy: Secondary | ICD-10-CM | POA: Diagnosis not present

## 2017-01-29 DIAGNOSIS — Z853 Personal history of malignant neoplasm of breast: Secondary | ICD-10-CM | POA: Diagnosis not present

## 2017-01-29 DIAGNOSIS — F419 Anxiety disorder, unspecified: Secondary | ICD-10-CM | POA: Diagnosis not present

## 2017-01-29 DIAGNOSIS — J45909 Unspecified asthma, uncomplicated: Secondary | ICD-10-CM | POA: Insufficient documentation

## 2017-01-29 DIAGNOSIS — M1990 Unspecified osteoarthritis, unspecified site: Secondary | ICD-10-CM | POA: Insufficient documentation

## 2017-01-29 DIAGNOSIS — R51 Headache: Secondary | ICD-10-CM | POA: Insufficient documentation

## 2017-01-29 DIAGNOSIS — E039 Hypothyroidism, unspecified: Secondary | ICD-10-CM | POA: Insufficient documentation

## 2017-01-29 DIAGNOSIS — Z87891 Personal history of nicotine dependence: Secondary | ICD-10-CM | POA: Diagnosis not present

## 2017-01-29 DIAGNOSIS — L905 Scar conditions and fibrosis of skin: Secondary | ICD-10-CM | POA: Diagnosis not present

## 2017-01-29 DIAGNOSIS — Z9010 Acquired absence of unspecified breast and nipple: Secondary | ICD-10-CM

## 2017-01-29 DIAGNOSIS — G4730 Sleep apnea, unspecified: Secondary | ICD-10-CM | POA: Diagnosis not present

## 2017-01-29 DIAGNOSIS — F418 Other specified anxiety disorders: Secondary | ICD-10-CM | POA: Diagnosis not present

## 2017-01-29 DIAGNOSIS — K219 Gastro-esophageal reflux disease without esophagitis: Secondary | ICD-10-CM | POA: Insufficient documentation

## 2017-01-29 DIAGNOSIS — Z923 Personal history of irradiation: Secondary | ICD-10-CM | POA: Insufficient documentation

## 2017-01-29 DIAGNOSIS — F329 Major depressive disorder, single episode, unspecified: Secondary | ICD-10-CM | POA: Diagnosis not present

## 2017-01-29 DIAGNOSIS — Z9013 Acquired absence of bilateral breasts and nipples: Secondary | ICD-10-CM | POA: Diagnosis not present

## 2017-01-29 HISTORY — PX: REMOVAL OF BILATERAL TISSUE EXPANDERS WITH PLACEMENT OF BILATERAL BREAST IMPLANTS: SHX6431

## 2017-01-29 SURGERY — REMOVAL, TISSUE EXPANDER, BREAST, BILATERAL, WITH BILATERAL IMPLANT IMPLANT INSERTION
Anesthesia: General | Site: Breast | Laterality: Bilateral

## 2017-01-29 MED ORDER — GABAPENTIN 300 MG PO CAPS
300.0000 mg | ORAL_CAPSULE | Freq: Two times a day (BID) | ORAL | Status: DC
  Administered 2017-01-29: 300 mg via ORAL
  Filled 2017-01-29 (×2): qty 1

## 2017-01-29 MED ORDER — ACETAMINOPHEN 500 MG PO TABS
ORAL_TABLET | ORAL | Status: AC
  Administered 2017-01-29: 1000 mg via ORAL
  Filled 2017-01-29: qty 2

## 2017-01-29 MED ORDER — BUPIVACAINE LIPOSOME 1.3 % IJ SUSP
20.0000 mL | INTRAMUSCULAR | Status: DC
  Filled 2017-01-29 (×2): qty 20

## 2017-01-29 MED ORDER — SODIUM CHLORIDE 0.9 % IV SOLN
INTRAVENOUS | Status: DC | PRN
  Administered 2017-01-29: 40 mL

## 2017-01-29 MED ORDER — METHOCARBAMOL 500 MG PO TABS
500.0000 mg | ORAL_TABLET | Freq: Three times a day (TID) | ORAL | Status: DC | PRN
  Administered 2017-01-29 – 2017-01-30 (×3): 500 mg via ORAL
  Filled 2017-01-29 (×2): qty 1

## 2017-01-29 MED ORDER — FENTANYL CITRATE (PF) 250 MCG/5ML IJ SOLN
INTRAMUSCULAR | Status: AC
  Filled 2017-01-29: qty 5

## 2017-01-29 MED ORDER — ALBUMIN HUMAN 5 % IV SOLN
INTRAVENOUS | Status: DC | PRN
  Administered 2017-01-29: 12:00:00 via INTRAVENOUS

## 2017-01-29 MED ORDER — METHOCARBAMOL 500 MG PO TABS
ORAL_TABLET | ORAL | Status: AC
  Administered 2017-01-30: 500 mg via ORAL
  Filled 2017-01-29: qty 1

## 2017-01-29 MED ORDER — CELECOXIB 200 MG PO CAPS
ORAL_CAPSULE | ORAL | Status: AC
  Administered 2017-01-29: 200 mg via ORAL
  Filled 2017-01-29: qty 1

## 2017-01-29 MED ORDER — PHENYLEPHRINE HCL 10 MG/ML IJ SOLN
INTRAMUSCULAR | Status: DC | PRN
  Administered 2017-01-29: 80 ug via INTRAVENOUS

## 2017-01-29 MED ORDER — HEPARIN SODIUM (PORCINE) 5000 UNIT/ML IJ SOLN
INTRAMUSCULAR | Status: AC
  Administered 2017-01-29: 5000 [IU] via SUBCUTANEOUS
  Filled 2017-01-29: qty 1

## 2017-01-29 MED ORDER — MIDAZOLAM HCL 5 MG/5ML IJ SOLN
INTRAMUSCULAR | Status: DC | PRN
  Administered 2017-01-29: 2 mg via INTRAVENOUS

## 2017-01-29 MED ORDER — HYDROCODONE-ACETAMINOPHEN 5-325 MG PO TABS
1.0000 | ORAL_TABLET | ORAL | Status: DC | PRN
  Administered 2017-01-29 – 2017-01-30 (×4): 2 via ORAL
  Filled 2017-01-29 (×4): qty 2

## 2017-01-29 MED ORDER — VENLAFAXINE HCL ER 75 MG PO CP24
150.0000 mg | ORAL_CAPSULE | Freq: Every day | ORAL | Status: DC
  Administered 2017-01-30: 150 mg via ORAL
  Filled 2017-01-29: qty 2

## 2017-01-29 MED ORDER — KCL IN DEXTROSE-NACL 20-5-0.45 MEQ/L-%-% IV SOLN
INTRAVENOUS | Status: DC
  Administered 2017-01-29 – 2017-01-30 (×2): via INTRAVENOUS
  Filled 2017-01-29: qty 1000

## 2017-01-29 MED ORDER — CELECOXIB 200 MG PO CAPS
200.0000 mg | ORAL_CAPSULE | Freq: Two times a day (BID) | ORAL | Status: DC
  Administered 2017-01-29 – 2017-01-30 (×2): 200 mg via ORAL
  Filled 2017-01-29 (×3): qty 1

## 2017-01-29 MED ORDER — OXYCODONE HCL 5 MG/5ML PO SOLN: 5.0000 mg | Freq: Once | ORAL | Status: AC | PRN

## 2017-01-29 MED ORDER — ALPRAZOLAM 0.5 MG PO TABS
1.0000 mg | ORAL_TABLET | Freq: Three times a day (TID) | ORAL | Status: DC | PRN
  Administered 2017-01-29: 1 mg via ORAL
  Filled 2017-01-29: qty 2

## 2017-01-29 MED ORDER — ACETAMINOPHEN 500 MG PO TABS
1000.0000 mg | ORAL_TABLET | ORAL | Status: AC
  Administered 2017-01-29: 1000 mg via ORAL

## 2017-01-29 MED ORDER — LIDOCAINE 2% (20 MG/ML) 5 ML SYRINGE
INTRAMUSCULAR | Status: AC
  Filled 2017-01-29: qty 5

## 2017-01-29 MED ORDER — ONDANSETRON HCL 4 MG/2ML IJ SOLN: 4.0000 mg | Freq: Four times a day (QID) | INTRAMUSCULAR | Status: DC | PRN

## 2017-01-29 MED ORDER — CEFAZOLIN SODIUM-DEXTROSE 2-4 GM/100ML-% IV SOLN
INTRAVENOUS | Status: AC
  Filled 2017-01-29: qty 100

## 2017-01-29 MED ORDER — HYDROMORPHONE HCL 1 MG/ML IJ SOLN
0.5000 mg | INTRAMUSCULAR | Status: DC | PRN
  Administered 2017-01-29 – 2017-01-30 (×2): 0.5 mg via INTRAVENOUS
  Filled 2017-01-29 (×2): qty 1

## 2017-01-29 MED ORDER — LEVOTHYROXINE SODIUM 50 MCG PO TABS
50.0000 ug | ORAL_TABLET | Freq: Every day | ORAL | Status: DC
  Administered 2017-01-30: 50 ug via ORAL
  Filled 2017-01-29: qty 1

## 2017-01-29 MED ORDER — LIDOCAINE HCL (CARDIAC) 20 MG/ML IV SOLN
INTRAVENOUS | Status: DC | PRN
  Administered 2017-01-29: 50 mg via INTRAVENOUS

## 2017-01-29 MED ORDER — DEXAMETHASONE SODIUM PHOSPHATE 10 MG/ML IJ SOLN
INTRAMUSCULAR | Status: AC
  Filled 2017-01-29: qty 1

## 2017-01-29 MED ORDER — ROCURONIUM BROMIDE 10 MG/ML (PF) SYRINGE
PREFILLED_SYRINGE | INTRAVENOUS | Status: AC
  Filled 2017-01-29: qty 10

## 2017-01-29 MED ORDER — CEFAZOLIN SODIUM-DEXTROSE 2-4 GM/100ML-% IV SOLN
2.0000 g | INTRAVENOUS | Status: AC
  Administered 2017-01-29: 2 g via INTRAVENOUS

## 2017-01-29 MED ORDER — HYDROMORPHONE HCL 1 MG/ML IJ SOLN
INTRAMUSCULAR | Status: AC
  Filled 2017-01-29: qty 1

## 2017-01-29 MED ORDER — MIDAZOLAM HCL 2 MG/2ML IJ SOLN
INTRAMUSCULAR | Status: AC
  Filled 2017-01-29: qty 2

## 2017-01-29 MED ORDER — SODIUM CHLORIDE 0.9 % IV SOLN
Freq: Once | INTRAVENOUS | Status: AC
  Administered 2017-01-29: 12:00:00
  Filled 2017-01-29: qty 1

## 2017-01-29 MED ORDER — HEPARIN SODIUM (PORCINE) 5000 UNIT/ML IJ SOLN
5000.0000 [IU] | Freq: Once | INTRAMUSCULAR | Status: AC
  Administered 2017-01-29: 5000 [IU] via SUBCUTANEOUS

## 2017-01-29 MED ORDER — GABAPENTIN 300 MG PO CAPS
300.0000 mg | ORAL_CAPSULE | ORAL | Status: AC
  Administered 2017-01-29: 300 mg via ORAL

## 2017-01-29 MED ORDER — 0.9 % SODIUM CHLORIDE (POUR BTL) OPTIME
TOPICAL | Status: DC | PRN
  Administered 2017-01-29: 1000 mL

## 2017-01-29 MED ORDER — ONDANSETRON HCL 4 MG/2ML IJ SOLN
INTRAMUSCULAR | Status: AC
  Filled 2017-01-29: qty 2

## 2017-01-29 MED ORDER — ONDANSETRON HCL 4 MG/2ML IJ SOLN
INTRAMUSCULAR | Status: DC | PRN
  Administered 2017-01-29: 4 mg via INTRAVENOUS

## 2017-01-29 MED ORDER — ONDANSETRON 4 MG PO TBDP: 4.0000 mg | ORAL_TABLET | Freq: Four times a day (QID) | ORAL | Status: DC | PRN

## 2017-01-29 MED ORDER — ROCURONIUM BROMIDE 100 MG/10ML IV SOLN
INTRAVENOUS | Status: DC | PRN
  Administered 2017-01-29 (×3): 50 mg via INTRAVENOUS

## 2017-01-29 MED ORDER — GABAPENTIN 300 MG PO CAPS
ORAL_CAPSULE | ORAL | Status: AC
  Administered 2017-01-29: 300 mg via ORAL
  Filled 2017-01-29: qty 1

## 2017-01-29 MED ORDER — OXYCODONE HCL 5 MG PO TABS
ORAL_TABLET | ORAL | Status: AC
  Filled 2017-01-29: qty 1

## 2017-01-29 MED ORDER — HYDROMORPHONE HCL 1 MG/ML IJ SOLN
0.2500 mg | INTRAMUSCULAR | Status: DC | PRN
  Administered 2017-01-29 (×4): 0.5 mg via INTRAVENOUS

## 2017-01-29 MED ORDER — PROPOFOL 10 MG/ML IV BOLUS
INTRAVENOUS | Status: DC | PRN
  Administered 2017-01-29: 150 mg via INTRAVENOUS

## 2017-01-29 MED ORDER — PROPOFOL 10 MG/ML IV BOLUS
INTRAVENOUS | Status: AC
  Filled 2017-01-29: qty 20

## 2017-01-29 MED ORDER — DEXAMETHASONE SODIUM PHOSPHATE 10 MG/ML IJ SOLN
INTRAMUSCULAR | Status: DC | PRN
  Administered 2017-01-29: 10 mg via INTRAVENOUS

## 2017-01-29 MED ORDER — KCL IN DEXTROSE-NACL 20-5-0.45 MEQ/L-%-% IV SOLN
INTRAVENOUS | Status: AC
  Administered 2017-01-29: 1000 mL
  Filled 2017-01-29: qty 1000

## 2017-01-29 MED ORDER — SENNA 8.6 MG PO TABS: 1.0000 | ORAL_TABLET | Freq: Every day | ORAL | Status: DC | PRN

## 2017-01-29 MED ORDER — SUGAMMADEX SODIUM 200 MG/2ML IV SOLN
INTRAVENOUS | Status: DC | PRN
Start: 2017-01-29 — End: 2017-01-29
  Administered 2017-01-29: 150 mg via INTRAVENOUS

## 2017-01-29 MED ORDER — OXYCODONE HCL 5 MG PO TABS
5.0000 mg | ORAL_TABLET | Freq: Once | ORAL | Status: AC | PRN
Start: 2017-01-29 — End: 2017-01-29
  Administered 2017-01-29: 5 mg via ORAL

## 2017-01-29 MED ORDER — PHENYLEPHRINE 40 MCG/ML (10ML) SYRINGE FOR IV PUSH (FOR BLOOD PRESSURE SUPPORT)
PREFILLED_SYRINGE | INTRAVENOUS | Status: AC
  Filled 2017-01-29: qty 10

## 2017-01-29 MED ORDER — CEFAZOLIN SODIUM-DEXTROSE 1-4 GM/50ML-% IV SOLN
1.0000 g | Freq: Three times a day (TID) | INTRAVENOUS | Status: DC
  Administered 2017-01-29 – 2017-01-30 (×2): 1 g via INTRAVENOUS
  Filled 2017-01-29 (×3): qty 50

## 2017-01-29 MED ORDER — PANTOPRAZOLE SODIUM 40 MG PO TBEC
40.0000 mg | DELAYED_RELEASE_TABLET | Freq: Every day | ORAL | Status: DC
  Administered 2017-01-30: 40 mg via ORAL
  Filled 2017-01-29: qty 1

## 2017-01-29 MED ORDER — FENTANYL CITRATE (PF) 100 MCG/2ML IJ SOLN
INTRAMUSCULAR | Status: DC | PRN
  Administered 2017-01-29: 100 ug via INTRAVENOUS
  Administered 2017-01-29 (×8): 50 ug via INTRAVENOUS

## 2017-01-29 MED ORDER — SUGAMMADEX SODIUM 200 MG/2ML IV SOLN
INTRAVENOUS | Status: AC
  Filled 2017-01-29: qty 2

## 2017-01-29 MED ORDER — POLYETHYLENE GLYCOL 3350 17 G PO PACK: 17.0000 g | PACK | Freq: Every day | ORAL | Status: DC | PRN

## 2017-01-29 MED ORDER — SCOPOLAMINE 1 MG/3DAYS TD PT72
MEDICATED_PATCH | TRANSDERMAL | Status: AC
  Administered 2017-01-29: 1.5 mg
  Filled 2017-01-29: qty 1

## 2017-01-29 MED ORDER — DOCUSATE SODIUM 100 MG PO CAPS: 100.0000 mg | ORAL_CAPSULE | Freq: Every day | ORAL | Status: DC | PRN

## 2017-01-29 MED ORDER — LACTATED RINGERS IV SOLN
INTRAVENOUS | Status: DC | PRN
  Administered 2017-01-29 (×2): via INTRAVENOUS

## 2017-01-29 MED ORDER — ENOXAPARIN SODIUM 40 MG/0.4ML ~~LOC~~ SOLN
40.0000 mg | SUBCUTANEOUS | Status: DC
  Administered 2017-01-30: 40 mg via SUBCUTANEOUS
  Filled 2017-01-29: qty 0.4

## 2017-01-29 MED ORDER — EPHEDRINE SULFATE 50 MG/ML IJ SOLN
INTRAMUSCULAR | Status: DC | PRN
  Administered 2017-01-29: 10 mg via INTRAVENOUS

## 2017-01-29 MED ORDER — SCOPOLAMINE 1 MG/3DAYS TD PT72
MEDICATED_PATCH | TRANSDERMAL | Status: DC | PRN
  Administered 2017-01-29: 1 via TRANSDERMAL

## 2017-01-29 MED ORDER — CELECOXIB 200 MG PO CAPS
200.0000 mg | ORAL_CAPSULE | ORAL | Status: AC
  Administered 2017-01-29: 200 mg via ORAL

## 2017-01-29 SURGICAL SUPPLY — 109 items
ADH SKN CLS APL DERMABOND .7 (GAUZE/BANDAGES/DRESSINGS) ×2
ALLODERM 8X16 MED THICK (Tissue) ×3 IMPLANT
APPLIER CLIP 9.375 MED OPEN (MISCELLANEOUS) ×3
APR CLP MED 9.3 20 MLT OPN (MISCELLANEOUS) ×2
BAG DECANTER FOR FLEXI CONT (MISCELLANEOUS) ×3 IMPLANT
BINDER ABDOMINAL 10 UNV 27-48 (MISCELLANEOUS) IMPLANT
BINDER ABDOMINAL 12 ML 46-62 (SOFTGOODS) IMPLANT
BINDER BREAST LRG (GAUZE/BANDAGES/DRESSINGS) IMPLANT
BINDER BREAST MEDIUM (GAUZE/BANDAGES/DRESSINGS) IMPLANT
BINDER BREAST XLRG (GAUZE/BANDAGES/DRESSINGS) IMPLANT
BINDER BREAST XXLRG (GAUZE/BANDAGES/DRESSINGS) ×3 IMPLANT
BLADE SURG 10 STRL SS (BLADE) ×6 IMPLANT
BLADE SURG 11 STRL SS (BLADE) ×3 IMPLANT
BNDG COHESIVE 4X5 TAN STRL (GAUZE/BANDAGES/DRESSINGS) ×3 IMPLANT
BNDG GAUZE ELAST 4 BULKY (GAUZE/BANDAGES/DRESSINGS) IMPLANT
CANISTER LIPO FAT HARVEST (MISCELLANEOUS) IMPLANT
CANISTER SUCT 1200ML W/VALVE (MISCELLANEOUS) IMPLANT
CHLORAPREP W/TINT 26ML (MISCELLANEOUS) ×15 IMPLANT
CLIP APPLIE 9.375 MED OPEN (MISCELLANEOUS) ×1 IMPLANT
CONT SPEC 4OZ CLIKSEAL STRL BL (MISCELLANEOUS) ×3 IMPLANT
COVER BACK TABLE 60X90IN (DRAPES) IMPLANT
COVER MAYO STAND STRL (DRAPES) ×3 IMPLANT
DECANTER SPIKE VIAL GLASS SM (MISCELLANEOUS) IMPLANT
DERMABOND ADVANCED (GAUZE/BANDAGES/DRESSINGS) ×1
DERMABOND ADVANCED .7 DNX12 (GAUZE/BANDAGES/DRESSINGS) ×2 IMPLANT
DRAIN CHANNEL 15F RND FF W/TCR (WOUND CARE) ×6 IMPLANT
DRAIN CHANNEL 19F RND (DRAIN) ×3 IMPLANT
DRAPE ORTHO SPLIT ENLRG (DRAPES) ×6 IMPLANT
DRAPE TOP ARMCOVERS (MISCELLANEOUS) IMPLANT
DRAPE U-SHAPE 76X120 STRL (DRAPES) ×3 IMPLANT
DRAPE UTILITY XL STRL (DRAPES) ×2 IMPLANT
DRSG PAD ABDOMINAL 8X10 ST (GAUZE/BANDAGES/DRESSINGS) IMPLANT
ELECT BLADE 4.0 EZ CLEAN MEGAD (MISCELLANEOUS) ×3
ELECT BLADE 6.5 EXT (BLADE) ×3 IMPLANT
ELECT COATED BLADE 2.86 ST (ELECTRODE) ×3 IMPLANT
ELECT REM PT RETURN 9FT ADLT (ELECTROSURGICAL) ×3
ELECTRODE BLDE 4.0 EZ CLN MEGD (MISCELLANEOUS) ×2 IMPLANT
ELECTRODE REM PT RTRN 9FT ADLT (ELECTROSURGICAL) ×2 IMPLANT
EVACUATOR SILICONE 100CC (DRAIN) ×9 IMPLANT
FILTER LIPOSUCTION (MISCELLANEOUS) ×1 IMPLANT
GLOVE BIO SURGEON STRL SZ 6 (GLOVE) ×6 IMPLANT
GLOVE BIO SURGEON STRL SZ7 (GLOVE) ×3 IMPLANT
GLOVE BIOGEL PI IND STRL 6 (GLOVE) ×4 IMPLANT
GLOVE BIOGEL PI IND STRL 6.5 (GLOVE) ×2 IMPLANT
GLOVE BIOGEL PI IND STRL 7.0 (GLOVE) ×2 IMPLANT
GLOVE BIOGEL PI IND STRL 7.5 (GLOVE) ×4 IMPLANT
GLOVE BIOGEL PI IND STRL 8 (GLOVE) ×1 IMPLANT
GLOVE BIOGEL PI INDICATOR 6 (GLOVE) ×2
GLOVE BIOGEL PI INDICATOR 6.5 (GLOVE) ×1
GLOVE BIOGEL PI INDICATOR 7.0 (GLOVE) ×1
GLOVE BIOGEL PI INDICATOR 7.5 (GLOVE) ×2
GLOVE BIOGEL PI INDICATOR 8 (GLOVE) ×1
GLOVE ECLIPSE 7.5 STRL STRAW (GLOVE) ×9 IMPLANT
GLOVE SURG SS PI 6.0 STRL IVOR (GLOVE) ×3 IMPLANT
GLOVE SURG SS PI 6.5 STRL IVOR (GLOVE) ×3 IMPLANT
GOWN STRL REUS W/ TWL LRG LVL3 (GOWN DISPOSABLE) ×14 IMPLANT
GOWN STRL REUS W/TWL LRG LVL3 (GOWN DISPOSABLE) ×21
IMPL BREAST P6.3XRND MDRT 560 (Breast) ×2 IMPLANT
IMPL BRST P6.3XRND MDRT 560CC (Breast) ×4 IMPLANT
IMPLANT BREAST GEL 560CC (Breast) ×6 IMPLANT
KIT BASIN OR (CUSTOM PROCEDURE TRAY) ×3 IMPLANT
KIT ROOM TURNOVER OR (KITS) ×3 IMPLANT
LINER CANISTER 1000CC FLEX (MISCELLANEOUS) ×1 IMPLANT
LIQUID BAND (GAUZE/BANDAGES/DRESSINGS) IMPLANT
MARKER SKIN DUAL TIP RULER LAB (MISCELLANEOUS) ×3 IMPLANT
NDL SAFETY ECLIPSE 18X1.5 (NEEDLE) ×2 IMPLANT
NEEDLE HYPO 18GX1.5 SHARP (NEEDLE) ×3
NS IRRIG 1000ML POUR BTL (IV SOLUTION) ×3 IMPLANT
PAD ABD 8X10 STRL (GAUZE/BANDAGES/DRESSINGS) ×3 IMPLANT
PAD ALCOHOL SWAB (MISCELLANEOUS) IMPLANT
PENCIL BUTTON HOLSTER BLD 10FT (ELECTRODE) ×2 IMPLANT
PIN SAFETY STERILE (MISCELLANEOUS) ×3 IMPLANT
PUNCH BIOPSY 4MM (MISCELLANEOUS) ×3
PUNCH BIOPSY DISP 4 (MISCELLANEOUS) ×2 IMPLANT
SHEET MEDIUM DRAPE 40X70 STRL (DRAPES) ×6 IMPLANT
SIZER BREAST REUSE GEL 545CC (SIZER) ×3
SIZER BREAST REUSE GEL 560CC (SIZER) ×3
SIZER BREAST REUSE GEL 580CC (SIZER) ×3
SIZER BRST REUSE GEL 545CC (SIZER) ×2 IMPLANT
SIZER BRST REUSE GEL 560CC (SIZER) ×2 IMPLANT
SIZER BRST REUSE GEL 580CC (SIZER) ×2 IMPLANT
SLEEVE SCD COMPRESS KNEE MED (MISCELLANEOUS) ×3 IMPLANT
SOL PREP POV-IOD 4OZ 10% (MISCELLANEOUS) ×6 IMPLANT
SPONGE LAP 18X18 X RAY DECT (DISPOSABLE) ×6 IMPLANT
STAPLER VISISTAT 35W (STAPLE) ×3 IMPLANT
STOCKINETTE IMPERVIOUS 9X36 MD (GAUZE/BANDAGES/DRESSINGS) ×3 IMPLANT
SUT ETHILON 2 0 FS 18 (SUTURE) ×9 IMPLANT
SUT MNCRL AB 4-0 PS2 18 (SUTURE) ×7 IMPLANT
SUT PDS AB 2-0 CT1 27 (SUTURE) ×6 IMPLANT
SUT PDS AB 2-0 CT2 27 (SUTURE) ×15 IMPLANT
SUT VIC AB 3-0 PS1 18 (SUTURE) ×3
SUT VIC AB 3-0 PS1 18XBRD (SUTURE) ×2 IMPLANT
SUT VIC AB 3-0 SH 27 (SUTURE) ×6
SUT VIC AB 3-0 SH 27X BRD (SUTURE) ×4 IMPLANT
SUT VIC AB 4-0 PS2 18 (SUTURE) ×6 IMPLANT
SUT VICRYL 4-0 PS2 18IN ABS (SUTURE) IMPLANT
SYR 10ML LL (SYRINGE) IMPLANT
SYR 50ML LL SCALE MARK (SYRINGE) IMPLANT
SYR BULB IRRIGATION 50ML (SYRINGE) IMPLANT
SYR CONTROL 10ML LL (SYRINGE) ×3 IMPLANT
SYR TB 1ML 26GX3/8 SAFETY (SYRINGE) IMPLANT
TISSUE ALLDRM 8X16 MED THICK (Tissue) ×2 IMPLANT
TOWEL OR 17X24 6PK STRL BLUE (TOWEL DISPOSABLE) ×6 IMPLANT
TRAY FOLEY W/METER SILVER 14FR (SET/KITS/TRAYS/PACK) ×3 IMPLANT
TUBE CONNECTING 20X1/4 (TUBING) ×3 IMPLANT
TUBING INFILTRATION IT-10001 (TUBING) IMPLANT
TUBING SET GRADUATE ASPIR 12FT (MISCELLANEOUS) ×1 IMPLANT
UNDERPAD 30X30 (UNDERPADS AND DIAPERS) IMPLANT
YANKAUER SUCT BULB TIP NO VENT (SUCTIONS) IMPLANT

## 2017-01-29 NOTE — Transfer of Care (Signed)
Immediate Anesthesia Transfer of Care Note  Patient: [name]  Procedure(s) Performed: REMOVAL OF BILATERAL TISSUE EXPANDERS WITH PLACEMENT OF BILATERAL SILICONE BREAST IMPLANTS, ALLODERM TO LEFT BREAST RECONSTRUCTION;  RIGHT LATISSUMUS FLAP (Bilateral Breast)  Patient Location: PACU  Anesthesia Type:General  Level of Consciousness: awake, alert , oriented and sedated  Airway & Oxygen Therapy: Patient Spontanous Breathing and Patient connected to nasal cannula oxygen  Post-op Assessment: Report given to RN, Post -op Vital signs reviewed and stable and Patient moving all extremities  Post vital signs: Reviewed and stable  Last Vitals:  Vitals:   01/29/17 0755 01/29/17 1400  BP: (!) 146/87 131/88  Pulse: 96 88  Resp: 18 16  Temp: 36.6 C (!) 36.2 C  SpO2: 99% 100%    Last Pain:  Vitals:   01/29/17 0755  TempSrc: Oral         Complications: No apparent anesthesia complications

## 2017-01-29 NOTE — Interval H&P Note (Signed)
History and Physical Interval Note:  01/29/2017 8:58 AM  [name]  has presented today for surgery, with the diagnosis of history of breast cancer  The various methods of treatment have been discussed with the patient and family. After consideration of risks, benefits and other options for treatment, the patient has consented to  Procedure(s) with comments: REMOVAL OF BILATERAL TISSUE EXPANDERS WITH PLACEMENT OF BILATERAL SILICONE BREAST IMPLANTS, POSSIBLE ALLODERM TO LEFT BREAST RECONSTRUCTION  RIGHT LATISSUMUS FLAP AND POSSIBLE LIPOFILLING (Bilateral) - Requesting RNFA LIPOSUCTION WITH LIPOFILLING (Bilateral) as a surgical intervention .  The patient's history has been reviewed, patient examined, no change in status, stable for surgery.  I have reviewed the patient's chart and labs.  Questions were answered to the patient's satisfaction.     [name]

## 2017-01-29 NOTE — Anesthesia Procedure Notes (Signed)
Procedure Name: Intubation Date/Time: 01/29/2017 9:51 AM Performed by: Scheryl Darter, CRNA Pre-anesthesia Checklist: Patient identified, Emergency Drugs available, Suction available and Patient being monitored Patient Re-evaluated:Patient Re-evaluated prior to induction Oxygen Delivery Method: Circle System Utilized Preoxygenation: Pre-oxygenation with 100% oxygen Induction Type: IV induction Ventilation: Mask ventilation without difficulty Laryngoscope Size: Miller and 2 Grade View: Grade II Tube type: Oral Tube size: 7.0 mm Number of attempts: 1 Airway Equipment and Method: Stylet and Oral airway Placement Confirmation: ETT inserted through vocal cords under direct vision,  positive ETCO2 and breath sounds checked- equal and bilateral Secured at: 22 cm Tube secured with: Tape Dental Injury: Teeth and Oropharynx as per pre-operative assessment

## 2017-01-29 NOTE — Anesthesia Preprocedure Evaluation (Addendum)
Anesthesia Evaluation  Patient identified by MRN, date of birth, ID band Patient awake    Reviewed: Allergy & Precautions, H&P , NPO status , Patient's Chart, lab work & pertinent test results  History of Anesthesia Complications (+) PONV and history of anesthetic complications  Airway Mallampati: II   Neck ROM: full    Dental  (+) Teeth Intact   Pulmonary shortness of breath, asthma , sleep apnea , former smoker,    breath sounds clear to auscultation       Cardiovascular negative cardio ROS   Rhythm:regular Rate:Normal     Neuro/Psych  Headaches, PSYCHIATRIC DISORDERS Anxiety Depression    GI/Hepatic GERD  ,  Endo/Other  Hypothyroidism   Renal/GU H/o stones     Musculoskeletal  (+) Arthritis ,   Abdominal   Peds  Hematology   Anesthesia Other Findings   Reproductive/Obstetrics H/o breast CA                            Anesthesia Physical Anesthesia Plan  ASA: II  Anesthesia Plan: General   Post-op Pain Management:    Induction: Intravenous  PONV Risk Score and Plan: 4 or greater and Ondansetron, Dexamethasone, Midazolam, Scopolamine patch - Pre-op and Treatment may vary due to age or medical condition  Airway Management Planned: Oral ETT  Additional Equipment:   Intra-op Plan:   Post-operative Plan: Extubation in OR  Informed Consent: I have reviewed the patients History and Physical, chart, labs and discussed the procedure including the risks, benefits and alternatives for the proposed anesthesia with the patient or authorized representative who has indicated his/her understanding and acceptance.     Plan Discussed with: CRNA, Anesthesiologist and Surgeon  Anesthesia Plan Comments:         Anesthesia Quick Evaluation

## 2017-01-29 NOTE — Op Note (Signed)
Operative Note   DATE OF OPERATION: 12.7.18  LOCATION: [facility]  SURGICAL DIVISION: Plastic Surgery  PREOPERATIVE DIAGNOSES:  1. History right breast cancer 2. Acquired absence breasts 3. History therapeutic radiation  POSTOPERATIVE DIAGNOSES:  same  PROCEDURE:  1. Removal bilateral tissue expanders and placement silicone implants 2. Acellular dermis (Alloderm) to left chest 85 cm2 3. Right latissimus dorsi flap  SURGEON: Irene Limbo MD MBA  ASSISTANT: S Hitchcock RNFA  ANESTHESIA:  General.   EBL: 664 ml  COMPLICATIONS: None immediate.   INDICATIONS FOR PROCEDURE:  The patient, [name], is a 56 y.o. female born on [birth year], is here for second stage breast reconstruction following bilateral skin reduction pattern mastectomies with immediate expander acellular dermis reconstruction. She received adjuvant radiation to right chest and plan for latissimus flap to this side. She presents with lateral malposition of left expander and plan for ADM support on left.   FINDINGS: [name] Smooth Round Extra Projection 560 ml implants placed bilateral REF SRX-560. RIGHT SN [id number] Left SN [id number]. Full incorporation of previously placed ADM noted.   DESCRIPTION OF PROCEDURE:  The patient's operative site was marked with the patient in the preoperative area including sternal notch, chest midline, anterior axillary lines, breast meridians. The patient was taken to the operating room. SCDs were placed and IV antibiotics were given. Foley catheter placed. Patient was placed in left lateral position and prepped and draped. A time out was performed and all information was confirmed to be correct. Incision made through prior right inframammary fold scar.Skin flaps elevated off underlying pectoralis muscle.The expander was removed. Additional skin flap elevated off anterior pectoalis surface to accommodate implant. The pectoralis was sutured to chest wall with 0  V-lock suture.Incision made surrounding skin paddle designed over right back. Skin and superficial fascia elevated off surface of latissimus muscle and subcutaneous tunnel dissected to axilla joining anterior breast cavity. Patient's prior axillary scar was also utilized to deliver flap. Anterior border of latissimus identified and elevated. Muscle divided inferiorly at superior iliac spine. Submuscular dissection completed toward midline back and toward origin. Thoracodorsal nerve was identified and divided. Flap rotated into anterior chest cavity. Back irrigated and hemostasis ensured. Exparel infiltrated total 266 mg forbilateralchest and back. 15Fr drain placed and secured with 2-0 nylon. 2-0 PDS used to placed quilting sutures from elevated skin flaps to chest wall. Incision closed with 0 V lock suture in superficial fascia and dermis. Skin closure completed with 4-0 monocryl subcuticular and tissue glue applied. The latissimus flap was placed in chest cavity, mastectomy flaps stapled over flap. The patient was placed in supine position and re prepped and draped.   The flap muscle was redraped within right chest. The entire skin paddle brought with latissimus was removed. The muscle was secured to pectoralis and serratus muscle and inferiorly to abdominal wall fascia with interrupted 2-0 PDS. 71 Fr JP drain placed in breast cavity and secured to chest with 2-0 nylon. A sizer was placed beneath latissimus muscle.   Over left breast, patient demonstrated some redundancy of mastectomy flap. Mastectomy flap skin excised at IMF scar.  Incision carried through superficial fascia andimplant capsule. Expander removed. Acellular dermis perforated and inset to chest wall with 2-0 PDS at medial to desired anterior axillary line and superior to desired inframammary fold. The ADM was then redraped over sizer and sutured to anterior capsule with 2-0 PDS. Additional plication suture placed with interrupted 2-0  PDS to chest wall and anterior mastectomy flap capsule. 15  Fr JP placed and secured with 2-0 nylon.  Breasts tailor tacked closed, and patient brought to upright sitting position and assessed for symmetry. A NatrelleInspira Smooth Round Extra Projection 560 implant was selected for bilateral chest. Patent returned to supine position and bilateral cavities irrigated with solution containing Ancef, gentamicin, and bacitracin,hemostasis obtained.Cavities then irrigated with Betadine. Implant placed in left chest cavity. Remainder ADM sutured to capsule over lower pole implant. Superficial fascia closed with running 3-0 vicryl followed by 4-0 vicryl in dermis, 4-0 monocryl subcuticular for skin closure.  Right implant placed below latissimus muscle. Remainder oflatissimus muscle sutured to chest wall withPDS sutures.Closure incision completed with 3-0 vicryl in superficial fascia, 4-0 vicryl in dermis, 4-0 monocryl for skin closure.  Tissue adhesive was applied to breast and back incisions. Dry dressing and breast binder applied.   The patient was allowed to wake from anesthesia, extubated and taken to the recovery room in satisfactory condition.   SPECIMENS: left mastectomy flap  DRAINS: 15 Fr JP in right back, 19 fr JP in right chest , 15 Fr JP in left chest  Irene Limbo, MD [facility] Plastic & Reconstructive Surgery [phone], pin [phone]

## 2017-01-30 DIAGNOSIS — Z87891 Personal history of nicotine dependence: Secondary | ICD-10-CM | POA: Diagnosis not present

## 2017-01-30 DIAGNOSIS — Z923 Personal history of irradiation: Secondary | ICD-10-CM | POA: Diagnosis not present

## 2017-01-30 DIAGNOSIS — Z853 Personal history of malignant neoplasm of breast: Secondary | ICD-10-CM | POA: Diagnosis not present

## 2017-01-30 DIAGNOSIS — F419 Anxiety disorder, unspecified: Secondary | ICD-10-CM | POA: Diagnosis not present

## 2017-01-30 DIAGNOSIS — R51 Headache: Secondary | ICD-10-CM | POA: Diagnosis not present

## 2017-01-30 DIAGNOSIS — E039 Hypothyroidism, unspecified: Secondary | ICD-10-CM | POA: Diagnosis not present

## 2017-01-30 DIAGNOSIS — G4730 Sleep apnea, unspecified: Secondary | ICD-10-CM | POA: Diagnosis not present

## 2017-01-30 DIAGNOSIS — M1990 Unspecified osteoarthritis, unspecified site: Secondary | ICD-10-CM | POA: Diagnosis not present

## 2017-01-30 DIAGNOSIS — J45909 Unspecified asthma, uncomplicated: Secondary | ICD-10-CM | POA: Diagnosis not present

## 2017-01-30 DIAGNOSIS — F329 Major depressive disorder, single episode, unspecified: Secondary | ICD-10-CM | POA: Diagnosis not present

## 2017-01-30 DIAGNOSIS — K219 Gastro-esophageal reflux disease without esophagitis: Secondary | ICD-10-CM | POA: Diagnosis not present

## 2017-01-30 MED ORDER — METHOCARBAMOL 500 MG PO TABS: 500.0000 mg | ORAL_TABLET | Freq: Three times a day (TID) | ORAL | 0 refills | Status: DC | PRN

## 2017-01-30 MED ORDER — SULFAMETHOXAZOLE-TRIMETHOPRIM 800-160 MG PO TABS: 1.0000 | ORAL_TABLET | Freq: Two times a day (BID) | ORAL | 0 refills | Status: DC

## 2017-01-30 MED ORDER — HYDROCODONE-ACETAMINOPHEN 5-325 MG PO TABS: 1.0000 | ORAL_TABLET | ORAL | 0 refills | Status: DC | PRN

## 2017-01-30 NOTE — Progress Notes (Signed)
[name] to be D/C'd  per MD order. Discussed with the patient and all questions fully answered.  VSS, Skin clean, dry and intact without evidence of skin break down, no evidence of skin tears noted.  IV catheter discontinued intact. Site without signs and symptoms of complications. Dressing and pressure applied.  An After Visit Summary was printed and given to the patient. Patient received prescription.  D/c education completed with patient/family including follow up instructions, medication list, d/c activities limitations if indicated, with other d/c instructions as indicated by MD - patient able to verbalize understanding, all questions fully answered.   Patient instructed to return to ED, call 911, or call MD for any changes in condition.   Patient to be escorted via [place], and D/C home via private auto.

## 2017-01-30 NOTE — Progress Notes (Signed)
Notified Dr Iran Planas via telephone that patient was having large output from JP drains 1 and 3. Notified Thimmappa that drain 1 drained 220cc and drain 3 drained 260cc and that both drains were filling up right away after emptying and were continuing to fill up. Thimmappa stated this is normal for day 0 of surgery. Thimmappa also asked if there was any swelling noted at the sites; no swelling was noted. VS stable and patient had no complaints at the time. Thimmappa gave no further orders at this time. Will continue to monitor patient.

## 2017-01-30 NOTE — Discharge Summary (Signed)
Physician Discharge Summary  Patient ID: [name] MRN: [id number] DOB/AGE: [birth year] 56 y.o.  Admit date: 01/29/2017 Discharge date: 01/30/2017  Admission Diagnoses: History breast cancer, acquired absence breasts, history therapeutic radiation  Discharge Diagnoses:  Same  Discharged Condition: stable  Hospital Course: Postoperatively well controlled pain, tolerated diet and ambulatory with minimal assist. Right chest and back drains with high output overnight. This had lessened by am POD#1, no evidence hematoma over chest or back.   Treatments: surgery: removal bilateral tissue expanders and placement silicone implants, left chest placement acellular dermis, right latissimus dorsi flap  Discharge Exam: Blood pressure 123/72, pulse 83, temperature 98.5 F (36.9 C), temperature source Oral, resp. rate 16, height 5\' 6"  (1.676 m), weight 82.1 kg (180 lb 16 oz), SpO2 96 %. Incision/Wound: incisions intact dry, chest soft bilateral, left drain scant serous, right back and breast drains watery serosanguinous, back flat no hematoms  Disposition: 01-Home or Self Care  Discharge Instructions    Call MD for:  redness, tenderness, or signs of infection (pain, swelling, bleeding, redness, odor or green/yellow discharge around incision site)   Complete by:  As directed    Call MD for:  temperature >100.5   Complete by:  As directed    Discharge instructions   Complete by:  As directed    Ok to remove dressings and shower am 12.9.18. Soap and water ok, pat incisions dry. No creams or ointments over incisions. Do not let drains dangle in shower, attach to lanyard or similar.Strip and record drains twice daily and bring log to clinic visit.  Breast binder or soft compression bra all other times.  Ok to raise arms above shoulders for bathing and dressing.  No house yard work or exercise until cleared by MD.   Driving Restrictions   Complete by:  As directed    No driving for 1 week and  then no driving if taking narcotics   Lifting restrictions   Complete by:  As directed    No lifting >5 lbs until cleared by MD   Resume previous diet   Complete by:  As directed       Follow-up Information    Irene Limbo, MD Follow up in 1 week(s).   Specialty:  Plastic Surgery Why:  as scheduled Contact information: [address] [place] [address] [phone]           Signed: Irene Limbo 01/30/2017, 8:33 AM

## 2017-01-30 NOTE — Anesthesia Postprocedure Evaluation (Signed)
Anesthesia Post Note  Patient: [name]  Procedure(s) Performed: REMOVAL OF BILATERAL TISSUE EXPANDERS WITH PLACEMENT OF BILATERAL SILICONE BREAST IMPLANTS, ALLODERM TO LEFT BREAST RECONSTRUCTION;  RIGHT LATISSUMUS FLAP (Bilateral Breast)     Patient location during evaluation: PACU Anesthesia Type: General Level of consciousness: awake and alert Pain management: pain level controlled Vital Signs Assessment: post-procedure vital signs reviewed and stable Respiratory status: spontaneous breathing, nonlabored ventilation, respiratory function stable and patient connected to nasal cannula oxygen Cardiovascular status: blood pressure returned to baseline and stable Postop Assessment: no apparent nausea or vomiting Anesthetic complications: no    Last Vitals:  Vitals:   01/29/17 2346 01/30/17 0625  BP: 120/77 123/72  Pulse: 83 83  Resp: 16 16  Temp: 36.8 C 36.9 C  SpO2: 96% 96%    Last Pain:  Vitals:   01/30/17 0812  TempSrc:   PainSc: [employer]

## 2017-02-01 ENCOUNTER — Encounter (HOSPITAL_COMMUNITY): Payer: Self-pay | Admitting: Plastic Surgery

## 2017-02-06 ENCOUNTER — Telehealth: Payer: Self-pay | Admitting: Hematology

## 2017-02-06 NOTE — Telephone Encounter (Signed)
Scheduled appt per 12/03 staff message with YF - lab and f/u in 4 months. Patient to get updated schedule next visit.

## 2017-02-19 ENCOUNTER — Other Ambulatory Visit (HOSPITAL_BASED_OUTPATIENT_CLINIC_OR_DEPARTMENT_OTHER): Payer: BLUE CROSS/BLUE SHIELD

## 2017-02-19 ENCOUNTER — Encounter: Payer: Self-pay | Admitting: Adult Health

## 2017-02-19 ENCOUNTER — Telehealth: Payer: Self-pay | Admitting: Adult Health

## 2017-02-19 ENCOUNTER — Ambulatory Visit (HOSPITAL_BASED_OUTPATIENT_CLINIC_OR_DEPARTMENT_OTHER): Payer: BLUE CROSS/BLUE SHIELD | Admitting: Adult Health

## 2017-02-19 VITALS — BP 130/88 | HR 91 | Temp 98.4°F | Resp 18 | Wt 169.3 lb

## 2017-02-19 DIAGNOSIS — C50811 Malignant neoplasm of overlapping sites of right female breast: Secondary | ICD-10-CM | POA: Diagnosis not present

## 2017-02-19 DIAGNOSIS — Z170 Estrogen receptor positive status [ER+]: Secondary | ICD-10-CM

## 2017-02-19 LAB — COMPREHENSIVE METABOLIC PANEL
ALT: 23 U/L (ref 0–55)
ANION GAP: 8 meq/L (ref 3–11)
AST: 22 U/L (ref 5–34)
Albumin: 3.6 g/dL (ref 3.5–5.0)
Alkaline Phosphatase: 88 U/L (ref 40–150)
BUN: 16.4 mg/dL (ref 7.0–26.0)
CALCIUM: 8.8 mg/dL (ref 8.4–10.4)
CHLORIDE: 107 meq/L (ref 98–109)
CO2: 26 mEq/L (ref 22–29)
CREATININE: 0.9 mg/dL (ref 0.6–1.1)
EGFR: >60 mL/min/{1.73_m2} (ref >60)
Glucose: 99 mg/dl (ref 70–140)
Potassium: 4 mEq/L (ref 3.5–5.1)
Sodium: 141 mEq/L (ref 136–145)
TOTAL PROTEIN: 6.2 g/dL — AB (ref 6.4–8.3)
Total Bilirubin: 0.39 mg/dL (ref 0.20–1.20)

## 2017-02-19 LAB — CBC WITH DIFFERENTIAL/PLATELET
BASO%: 1 % (ref 0.0–2.0)
Basophils Absolute: 0.1 10*3/uL (ref 0.0–0.1)
EOS%: 6.5 % (ref 0.0–7.0)
Eosinophils Absolute: 0.3 10*3/uL (ref 0.0–0.5)
HEMATOCRIT: 34.9 % (ref 34.8–46.6)
HEMOGLOBIN: 11.2 g/dL — AB (ref 11.6–15.9)
LYMPH#: 1.4 10*3/uL (ref 0.9–3.3)
LYMPH%: 29.4 % (ref 14.0–49.7)
MCH: 27 pg (ref 25.1–34.0)
MCHC: 32.1 g/dL (ref 31.5–36.0)
MCV: 84.2 fL (ref 79.5–101.0)
MONO#: 0.5 10*3/uL (ref 0.1–0.9)
MONO%: 10.7 % (ref 0.0–14.0)
NEUT%: 52.4 % (ref 38.4–76.8)
NEUTROS ABS: 2.6 10*3/uL (ref 1.5–6.5)
PLATELETS: 302 10*3/uL (ref 145–400)
RBC: 4.14 10*6/uL (ref 3.70–5.45)
RDW: 17 % — AB (ref 11.2–14.5)
WBC: 4.9 10*3/uL (ref 3.9–10.3)

## 2017-02-19 NOTE — Telephone Encounter (Signed)
Scheduled appts per 12/28 los - patient did not want avs or calendar.

## 2017-02-19 NOTE — Progress Notes (Signed)
CLINIC:  Survivorship   REASON FOR VISIT:  Routine follow-up post-treatment for a recent history of breast cancer.  BRIEF ONCOLOGIC HISTORY:  Oncology History   Cancer Staging Malignant neoplasm of overlapping sites of right breast in female, estrogen receptor positive ([place]) Staging form: Breast, AJCC 8th Edition - Clinical stage from 03/23/2016: Stage IIA (cT3, cN1, cM0, G2, ER: Positive, PR: Positive, HER2: Negative) - Signed by Truitt Merle, MD on 03/31/2016 - Pathologic stage from 05/19/2016: Stage IA (pT2(m), pN1a, cM0, G1, ER: Positive, PR: Positive, HER2: Negative) - Signed by Truitt Merle, MD on 06/18/2016       Malignant neoplasm of overlapping sites of right breast in female, estrogen receptor positive ([place])   03/17/2016 Mammogram    B/l diagnostic mammogram and righ US showed a 3.6cm irregular mass in the right breast 11:00 position, posterior depth, there is a enlarged lymph node in the right axilla is highly suspicious for malignancy. additional 7 mm oval mass in the right breast lower outer quadrant is suspicious for malignancy.      03/23/2016 Initial Biopsy    Right breast 9:30 position biopsy showed invasive ductal carcinoma, grade 1. 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      03/23/2016 Receptors her2    Both breast and node biopsy tumor ER 100% positive, PR 70-95% positive, HER-2 negative, Ki-67 40%      03/23/2016 Initial Diagnosis    Malignant neoplasm of upper-outer quadrant of right breast in female, estrogen receptor positive ([place])      03/25/2016 Initial Biopsy    Right breast 11:00 position core needle biopsy showed invasive duct carcinoma, grade 2.       03/25/2016 Receptors her2    ER 95% positive, PR 90% positive, HER-2 negative, Ki-67 15%      03/25/2016 Miscellaneous    Mammaprint showed low risk type A with index +0.105      03/30/2016 Imaging    Bilateral breast MRI with and without contrast showed a large lobulated  enhancing mass within the upper-outer and lower outer right breast with surrounding nodularity, measuring 6.1 x 4.4 x 5.6 cm. Multiple critically sick and right axillary lymph nodes are demonstrated measuring up to 1.5 cm.       04/01/2016 -  Anti-estrogen oral therapy    Exemestane 25 mg daily, plan for 7 years       05/19/2016 Surgery    Bilateral mastectomy and right axillary regional lymph node resection.      05/19/2016 Pathology Results    -Right axillary regional lymph node resection revealed metastatic carcinoma in 2/7 lymph nodes. -Left simple mastectomy revealed lobular neoplasia and fibrocystic changes with adenosis and calcifications. 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 -Skin of the right mastectomy flap was benign. -mpT2, pN1a      06/25/2016 - 08/11/2016 Radiation Therapy    Site/dose:    1. 4 field Right breast was treated to 50.4 Gy in 25 fractions at 1.8 Gy per fraction. 2. The Right breast was boosted to 10 Gy in 5 fractions at 2 Gy per fraction.       INTERVAL HISTORY:  [name] presents to the [facility] today for our initial meeting to review her survivorship care plan detailing her treatment course for breast cancer, as well as monitoring long-term side effects of that  treatment, education regarding health maintenance, screening, and overall wellness and health promotion.     Overall, [name] reports feeling quite well.  She underwent reconstruction surgery on 01/29/2017.  She tolerated it well, but she did experience some easy bleeding.  She would like to be tested for Von Willebrand syndrome.  Her sister has it, and her father had it.  She is concerned she may have it.  She continues to take Exemestane and she is tolerating it well.  She was  previously having some hot flashes, however she says these are slowly improving.    [name] is also concerned about having a genetic abnormality.  She says she talked to her GYN who cares for her and her daughters and they told her she needs to be tested.  She would like to undergo testing to ensure she doesn't have any issues.      REVIEW OF SYSTEMS:  Review of Systems  Constitutional: Negative for appetite change, chills, fatigue, fever and unexpected weight change.  HENT:   Negative for hearing loss and lump/mass.   Eyes: Negative for eye problems and icterus.  Respiratory: Negative for chest tightness, cough and shortness of breath.   Cardiovascular: Negative for chest pain, leg swelling and palpitations.  Gastrointestinal: Negative for abdominal distention, abdominal pain, constipation, diarrhea, nausea and vomiting.  Endocrine: Positive for hot flashes (see interval history).  Genitourinary: Negative for difficulty urinating.   Musculoskeletal: Negative for arthralgias.  Skin: Negative for itching and rash.  Neurological: Negative for dizziness, extremity weakness, headaches and numbness.  Hematological: Negative for adenopathy. Does not bruise/bleed easily.  Psychiatric/Behavioral: The patient is not nervous/anxious.      ONCOLOGY TREATMENT TEAM:  1. Surgeon:  Dr. Donne Hazel at [facility] 2. Medical Oncologist: Dr. Burr Medico  3. Radiation Oncologist: Dr. Lisbeth Renshaw 4. Plastic Surgeon: Dr. Iran Planas    PAST MEDICAL/SURGICAL HISTORY:  Past Medical History:  Diagnosis Date  . Anxiety   . Anxiety disorder   . Asthma    h/o asthma as a child  . Cancer ([place]) 04/2016   right breast cancer  . Cholelithiasis   . Chronic kidney disease    obstruction of R kidney, ( not a stone) - currently resolved   . Depression   . Diverticulosis   . DJD (degenerative joint disease)    hands & back   . Dyspnea    resolved since she stopped smoking   . Epigastric abdominal pain   .  Esophageal stricture   . Family history of adverse reaction to anesthesia    daughter has N&V, takes long time to wake up   . Family hx of colon cancer   . 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 Ganglion cyst   . GERD (gastroesophageal reflux disease)   . Headache    low grade currently , family history of migraines   . Hemorrhoid   . History of radiation therapy 05/03 - 08/11/2016   1. 4 field Right breast was treated to 50.4 Gy in 25 fractions at 1.8 Gy per fraction. 2. The Right breast was boosted to 10 Gy in 5 fractions at 2 Gy per fraction.  . Hypothyroidism   . Plantar fasciitis, bilateral   . PONV (postoperative nausea and vomiting)    gets anxious with the mask on her face, also remarks that the scop. patch has helped in the past      Past Surgical History:  Procedure Laterality Date  . ABDOMINAL HYSTERECTOMY    .  BREAST RECONSTRUCTION WITH PLACEMENT OF TISSUE EXPANDER AND FLEX HD (ACELLULAR HYDRATED DERMIS) Bilateral 05/19/2016   Procedure: BREAST RECONSTRUCTION WITH PLACEMENT OF TISSUE EXPANDER AND ALLODERM PLACEMENT;  Surgeon: Irene Limbo, MD;  Location: [facility];  Service: Plastics;  Laterality: Bilateral;  . CESAREAN SECTION  1988  . CHOLECYSTECTOMY    . HEMORRHOID SURGERY    . LAPAROSCOPIC BILATERAL SALPINGECTOMY N/A 10/26/2012   Procedure: operative laparoscopy with lysis of adhesions;  Surgeon: Thornell Sartorius, MD;  Location: [facility];  Service: Gynecology;  Laterality: N/A;  . MASTECTOMY Bilateral   . MASTECTOMY WITH RADIOACTIVE SEED GUIDED EXCISION AND AXILLARY SENTINEL LYMPH NODE BIOPSY Bilateral 05/19/2016   Procedure: RIGHT SKIN SPARING MASTECTOMY WITH RIGHT RADIOACTIVE SEED TARGETED DISSECTION AND RIGHT SENTINEL LYMPH NODE BIOPSY, LEFT PROPHYLACTIC SKIN SPARING MASTECTOMY;  Surgeon: Rolm Bookbinder, MD;  Location: [facility];  Service: General;  Laterality: Bilateral;  . REMOVAL OF BILATERAL TISSUE EXPANDERS WITH PLACEMENT OF  BILATERAL BREAST IMPLANTS Bilateral 01/29/2017   Procedure: REMOVAL OF BILATERAL TISSUE EXPANDERS WITH PLACEMENT OF BILATERAL SILICONE BREAST IMPLANTS, ALLODERM TO LEFT BREAST RECONSTRUCTION;  RIGHT LATISSUMUS FLAP;  Surgeon: Irene Limbo, MD;  Location: [facility];  Service: Plastics;  Laterality: Bilateral;  Requesting RNFA  . TONSILLECTOMY    . TUBAL LIGATION    . urologic surgery for ureteropelvic junction obstruction       ALLERGIES:  Allergies  Allergen Reactions  . Adhesive [Tape] Dermatitis    Pt prefers paper tape  . Promethazine Hcl Itching and Nausea And Vomiting     CURRENT MEDICATIONS:  Outpatient Encounter Medications as of 02/19/2017  Medication Sig  . acetaminophen (TYLENOL) 500 MG tablet Take 1,000 mg by mouth every 6 (six) hours as needed.  . ALPRAZolam (XANAX) 1 MG tablet Take 1 tablet (1 mg total) by mouth 3 (three) times daily.  . celecoxib (CELEBREX) 200 MG capsule Take 1 capsule (200 mg total) by mouth daily.  . DENTA 5000 PLUS 1.1 % CREA dental cream   . docusate sodium (COLACE) 100 MG capsule Take 100 mg by mouth daily as needed for mild constipation.  Marland Kitchen exemestane (AROMASIN) 25 MG tablet Take 1 tablet (25 mg total) by mouth daily after breakfast.  . HYDROcodone-acetaminophen (NORCO/VICODIN) 5-325 MG tablet Take 1-2 tablets by mouth every 4 (four) hours as needed for moderate pain.  Marland Kitchen levothyroxine (SYNTHROID, LEVOTHROID) 50 MCG tablet Take 50 mcg by mouth daily before breakfast.  . methocarbamol (ROBAXIN) 500 MG tablet Take 1 tablet (500 mg total) by mouth every 8 (eight) hours as needed for muscle spasms.  Marland Kitchen omeprazole (PRILOSEC) 40 MG capsule TAKE ONE CAPSULE BY MOUTH EVERY DAY  . venlafaxine XR (EFFEXOR-XR) 150 MG 24 hr capsule Take 1 capsule (150 mg total) by mouth daily with breakfast.  . [DISCONTINUED] HYDROcodone-acetaminophen (NORCO/VICODIN) 5-325 MG tablet Take 1 tablet by mouth every 4 (four) hours as needed for moderate pain.   . [DISCONTINUED]  polyethylene glycol (MIRALAX / GLYCOLAX) packet Take 17 g by mouth daily as needed for severe constipation.   . [DISCONTINUED] senna (SENOKOT) 8.6 MG tablet Take 1 tablet by mouth daily as needed for constipation.   . [DISCONTINUED] sulfamethoxazole-trimethoprim (BACTRIM DS,SEPTRA DS) 800-160 MG tablet Take 1 tablet by mouth 2 (two) times daily. (Patient not taking: Reported on 02/19/2017)   No facility-administered encounter medications on file as of 02/19/2017.      ONCOLOGIC FAMILY HISTORY:  Family History  Problem Relation Age of Onset  . COPD Mother   .  Ovarian cancer Mother 11  . Diabetes Father   . Heart disease Father   . Hypertension Father   . Colon cancer Father        colon cancer  . Other Father   . Hypertension Sister   . Depression Sister   . Arthritis Sister   . Von Willebrand disease Sister   . Cancer Paternal Grandmother        ? breast cancer     GENETIC COUNSELING/TESTING: Referred today  SOCIAL HISTORY:  DENEICE WACK is married and lives with her husband and her grand daughter in [place], [place].  She has 2 children and they live in [place] and [place].  Ms. Raben is currently unemployed.  She denies any current or history of tobacco, or illicit drug use.  She drinks beer about 2-4 drinks per day.     PHYSICAL EXAMINATION:  Vital Signs:   Vitals:   02/19/17 1417  BP: 130/88  Pulse: 91  Resp: 18  Temp: 98.4 F (36.9 C)  SpO2: 100%   Filed Weights   02/19/17 1417  Weight: 169 lb 4.8 oz (76.8 kg)   General: Well-nourished, well-appearing female in no acute distress.  She is accompanied in clinic by her  husband today.   HEENT: Head is normocephalic.  Pupils equal and reactive to light. Conjunctivae clear without exudate.  Sclerae anicteric. Oral mucosa is pink, moist.  Oropharynx is pink without lesions or erythema.  Lymph: No cervical, supraclavicular, or infraclavicular lymphadenopathy noted on palpation.    Cardiovascular: Regular rate and rhythm.Marland Kitchen Respiratory: Clear to auscultation bilaterally. Chest expansion symmetric; breathing non-labored.  GI: Abdomen soft and round; non-tender, non-distended. Bowel sounds normoactive.  GU: Deferred.  Neuro: No focal deficits. Steady gait.  Psych: Mood and affect normal and appropriate for situation.  Extremities: No edema. MSK: No focal spinal tenderness to palpation.  Full range of motion in bilateral upper extremities Skin: Warm and dry.  LABORATORY DATA:  None for this visit.  DIAGNOSTIC IMAGING:  None for this visit.      ASSESSMENT AND PLAN:  Ms.. Saliba is a pleasant 56 y.o. female with Stage IIA right breast invasive ductal carcinoma, ER+/PR+/HER2-, diagnosed in 02/2016, treated with bilateral mastectomies, adjuvant radiation therapy, and anti-estrogen therapy with Exemestane beginning in 03/2016.  She presents to the Survivorship Clinic for our initial meeting and routine follow-up post-completion of treatment for breast cancer.    1. Stage IIA right breast cancer:  Ms. Lapaglia is continuing to recover from definitive treatment for breast cancer. She will follow-up with her medical oncologist, Dr. Burr Medico in 05/2017 with history and physical exam per surveillance protocol. I reviewed with her that she should see Dr. Donne Hazel about 6 months following that.  She will continue her anti-estrogen therapy with Exemestane. Thus far, she is tolerating the Exemestane well, with minimal side effects. Today, a comprehensive survivorship care plan and treatment summary was reviewed with the patient today detailing her breast cancer diagnosis, treatment course, potential late/long-term effects of treatment, appropriate follow-up care with recommendations for the future, and patient education resources.  A copy of this summary, along with a letter will be sent to the patient's primary care provider via mail/fax/In Basket message after today's visit.    2. Risk of Von  Willebrand: I will discuss this with Dr. Burr Medico.  We will get some labs drawn either at her lab appointment if she undergoes genetic testing, or prior to her next appointment with Dr. Burr Medico.  Patient  is agreeable.    3. Genetic testing: We reviewed in detail genetic testing and the genetic counselors here at the cancer center.  I reviewed with her the family history we have on file for her.  She tells me that her mom had ovarian cancer in her 57s or 35s.  I have updated her family history, and requested a genetics appointment today for the counselors to meet with her and discuss testing.    4. Bone health:  Given Ms. Essick's age/history of breast cancer and her current treatment regimen including anti-estrogen therapy with Exemestane, she is at risk for bone demineralization. She has undergone bone density testing at [facility], and doesn't know the results or the date of the test.  We have gotten her to sign a release so we can get this information from her.  In the meantime, she was encouraged to increase her consumption of foods rich in calcium, as well as increase her weight-bearing activities.  She was given education on specific activities to promote bone health.  5. Cancer screening:  Due to Ms. Ogborn's history and her age, she should receive screening for skin cancers, colon cancer, and gynecologic cancers.  The information and recommendations are listed on the patient's comprehensive care plan/treatment summary and were reviewed in detail with the patient.    6. Health maintenance and wellness promotion: Ms. Cortopassi was encouraged to consume 5-7 servings of fruits and vegetables per day. We reviewed the "Nutrition Rainbow" handout, as well as the handout "Take Control of Your Health and Reduce Your Cancer Risk" from the [facility].  She was also encouraged to engage in moderate to vigorous exercise for 30 minutes per day most days of the week. We discussed the [employer]  [employer] fitness program, which is designed for cancer survivors to help them become more physically fit after cancer treatments.  She was instructed to limit her alcohol consumption and continue to abstain from tobacco use.     7. Support services/counseling: It is not uncommon for this period of the patient's cancer care trajectory to be one of many emotions and stressors.  We discussed an opportunity for her to participate in the next session of [facility] ("Finding Your New Normal") support group series designed for patients after they have completed treatment.   Ms. Lutzke was encouraged to take advantage of our many other support services programs, support groups, and/or counseling in coping with her new life as a cancer survivor after completing anti-cancer treatment.  She was offered support today through active listening and expressive supportive counseling.  She was given information regarding our available services and encouraged to contact me with any questions or for help enrolling in any of our support group/programs.    Dispo:   -Return to cancer center in 05/2017 for f/u with Dr. Burr Medico  -Follow up with Dr. Donne Hazel in 11/2017 -Bone density every 2 years -Referral to genetics -She is welcome to return back to the Survivorship Clinic at any time; no additional follow-up needed at this time.  -Consider referral back to survivorship as a long-term survivor for continued surveillance  A total of (30) minutes of face-to-face time was spent with this patient with greater than 50% of that time in counseling and care-coordination.   Gardenia Phlegm, NP Survivorship Program [place] [phone]   Note: PRIMARY CARE PROVIDER Delia Chimes, NP (Inactive) None None

## 2017-02-20 ENCOUNTER — Encounter: Payer: Self-pay | Admitting: Adult Health

## 2017-02-24 DIAGNOSIS — Z923 Personal history of irradiation: Secondary | ICD-10-CM | POA: Diagnosis not present

## 2017-02-24 DIAGNOSIS — Z9013 Acquired absence of bilateral breasts and nipples: Secondary | ICD-10-CM | POA: Diagnosis not present

## 2017-02-24 DIAGNOSIS — Z853 Personal history of malignant neoplasm of breast: Secondary | ICD-10-CM | POA: Diagnosis not present

## 2017-02-24 DIAGNOSIS — L7634 Postprocedural seroma of skin and subcutaneous tissue following other procedure: Secondary | ICD-10-CM | POA: Diagnosis not present

## 2017-02-25 ENCOUNTER — Other Ambulatory Visit: Payer: Self-pay | Admitting: Adult Health

## 2017-02-25 DIAGNOSIS — Z832 Family history of diseases of the blood and blood-forming organs and certain disorders involving the immune mechanism: Secondary | ICD-10-CM

## 2017-03-01 DIAGNOSIS — Z923 Personal history of irradiation: Secondary | ICD-10-CM | POA: Diagnosis not present

## 2017-03-01 DIAGNOSIS — Z853 Personal history of malignant neoplasm of breast: Secondary | ICD-10-CM | POA: Diagnosis not present

## 2017-03-01 DIAGNOSIS — L7634 Postprocedural seroma of skin and subcutaneous tissue following other procedure: Secondary | ICD-10-CM | POA: Diagnosis not present

## 2017-03-01 DIAGNOSIS — Z9013 Acquired absence of bilateral breasts and nipples: Secondary | ICD-10-CM | POA: Diagnosis not present

## 2017-03-08 DIAGNOSIS — Z853 Personal history of malignant neoplasm of breast: Secondary | ICD-10-CM | POA: Diagnosis not present

## 2017-03-08 DIAGNOSIS — Z9013 Acquired absence of bilateral breasts and nipples: Secondary | ICD-10-CM | POA: Diagnosis not present

## 2017-03-08 DIAGNOSIS — L7634 Postprocedural seroma of skin and subcutaneous tissue following other procedure: Secondary | ICD-10-CM | POA: Diagnosis not present

## 2017-03-08 DIAGNOSIS — Z923 Personal history of irradiation: Secondary | ICD-10-CM | POA: Diagnosis not present

## 2017-03-16 DIAGNOSIS — L7634 Postprocedural seroma of skin and subcutaneous tissue following other procedure: Secondary | ICD-10-CM | POA: Diagnosis not present

## 2017-03-16 DIAGNOSIS — Z853 Personal history of malignant neoplasm of breast: Secondary | ICD-10-CM | POA: Diagnosis not present

## 2017-03-16 DIAGNOSIS — Z923 Personal history of irradiation: Secondary | ICD-10-CM | POA: Diagnosis not present

## 2017-03-16 DIAGNOSIS — Z9013 Acquired absence of bilateral breasts and nipples: Secondary | ICD-10-CM | POA: Diagnosis not present

## 2017-03-18 ENCOUNTER — Ambulatory Visit: Payer: BLUE CROSS/BLUE SHIELD | Admitting: Physical Therapy

## 2017-03-29 ENCOUNTER — Inpatient Hospital Stay: Payer: BLUE CROSS/BLUE SHIELD | Attending: Genetic Counselor | Admitting: Genetics

## 2017-03-29 ENCOUNTER — Inpatient Hospital Stay: Payer: BLUE CROSS/BLUE SHIELD

## 2017-03-29 DIAGNOSIS — Z800 Family history of malignant neoplasm of digestive organs: Secondary | ICD-10-CM

## 2017-03-29 DIAGNOSIS — C50811 Malignant neoplasm of overlapping sites of right female breast: Secondary | ICD-10-CM

## 2017-03-29 DIAGNOSIS — Z1379 Encounter for other screening for genetic and chromosomal anomalies: Secondary | ICD-10-CM

## 2017-03-29 DIAGNOSIS — Z8041 Family history of malignant neoplasm of ovary: Secondary | ICD-10-CM

## 2017-03-29 DIAGNOSIS — Z170 Estrogen receptor positive status [ER+]: Secondary | ICD-10-CM

## 2017-03-30 ENCOUNTER — Encounter: Payer: Self-pay | Admitting: Genetics

## 2017-03-30 DIAGNOSIS — Z8041 Family history of malignant neoplasm of ovary: Secondary | ICD-10-CM | POA: Insufficient documentation

## 2017-03-30 DIAGNOSIS — Z800 Family history of malignant neoplasm of digestive organs: Secondary | ICD-10-CM | POA: Insufficient documentation

## 2017-03-30 NOTE — Progress Notes (Signed)
REFERRING PROVIDER: Gardenia Phlegm, NP [place], [place] [address]  PRIMARY PROVIDER:  Delia Chimes, NP (Inactive)  PRIMARY REASON FOR VISIT:  1. Malignant neoplasm of overlapping sites of right breast in female, estrogen receptor positive ([place])   2. Family history of ovarian cancer   3. Family history of colon cancer     HISTORY OF PRESENT ILLNESS:   [name], a 57 y.o. female, was seen for a [facility] genetics consultation at the request of Dr. Delice Bison due to a personal and family history of cancer.  [name] presents to clinic today to discuss the possibility of a hereditary predisposition to cancer, genetic testing, and to further clarify her future cancer risks, as well as potential cancer risks for family members.   In Jan 2018, at the age of 37, [name] was diagnosed with Invasive ductal carcinoma ER+, PR+, HER2- of the right breast. This was treated with bilateral mastectomies followed by adjuvant radiation and anti-estrogen therapy.  CANCER HISTORY:  Oncology History   Cancer Staging Malignant neoplasm of overlapping sites of right breast in female, estrogen receptor positive ([place]) Staging form: Breast, AJCC 8th Edition - Clinical stage from 03/23/2016: Stage IIA (cT3, cN1, cM0, G2, ER: Positive, PR: Positive, HER2: Negative) - Signed by Truitt Merle, MD on 03/31/2016 - Pathologic stage from 05/19/2016: Stage IA (pT2(m), pN1a, cM0, G1, ER: Positive, PR: Positive, HER2: Negative) - Signed by Truitt Merle, MD on 06/18/2016       Malignant neoplasm of overlapping sites of right breast in female, estrogen receptor positive ([place])   03/17/2016 Mammogram    B/l diagnostic mammogram and righ US showed a 3.6cm irregular mass in the right breast 11:00 position, posterior depth, there is a enlarged lymph node in the right axilla is highly suspicious for malignancy. additional 7 mm oval mass in the right breast lower outer quadrant is suspicious for malignancy.       03/23/2016 Initial Biopsy    Right breast 9:30 position biopsy showed invasive ductal carcinoma, grade 1. 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      03/23/2016 Receptors her2    Both breast and node biopsy tumor ER 100% positive, PR 70-95% positive, HER-2 negative, Ki-67 40%      03/23/2016 Initial Diagnosis    Malignant neoplasm of upper-outer quadrant of right breast in female, estrogen receptor positive ([place])      03/25/2016 Initial Biopsy    Right breast 11:00 position core needle biopsy showed invasive duct carcinoma, grade 2.       03/25/2016 Receptors her2    ER 95% positive, PR 90% positive, HER-2 negative, Ki-67 15%      03/25/2016 Miscellaneous    Mammaprint showed low risk type A with index +0.105      03/30/2016 Imaging    Bilateral breast MRI with and without contrast showed a large lobulated enhancing mass within the upper-outer and lower outer right breast with surrounding nodularity, measuring 6.1 x 4.4 x 5.6 cm. Multiple critically sick and right axillary lymph nodes are demonstrated measuring up to 1.5 cm.       04/01/2016 -  Anti-estrogen oral therapy    Exemestane 25 mg daily, plan for 7 years       05/19/2016 Surgery    Bilateral mastectomy and right axillary regional lymph node resection.      05/19/2016 Pathology Results    -Right axillary regional lymph node resection revealed metastatic carcinoma in 2/7 lymph  nodes. -Left simple mastectomy revealed lobular neoplasia and fibrocystic changes with adenosis and calcifications. 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 -Skin of the right mastectomy flap was benign. -mpT2, pN1a      06/25/2016 - 08/11/2016 Radiation Therapy    Site/dose:    1. 4 field Right breast was  treated to 50.4 Gy in 25 fractions at 1.8 Gy per fraction. 2. The Right breast was boosted to 10 Gy in 5 fractions at 2 Gy per fraction.        HORMONAL RISK FACTORS:  Menarche was at age 29.  First live birth at age 66.  Ovaries intact: yes.  Hysterectomy: yes. 2011 Menopausal status: postmenopausal.  HRT use: 0 years. Colonoscopy: yes; last in 2017, no polyps. every 5 years.   Past Medical History:  Diagnosis Date  . Anxiety   . Anxiety disorder   . Asthma    h/o asthma as a child  . Breast cancer ([place])   . Cancer ([place]) 04/2016   right breast cancer  . Cholelithiasis   . Chronic kidney disease    obstruction of R kidney, ( not a stone) - currently resolved   . Depression   . Diverticulosis   . DJD (degenerative joint disease)    hands & back   . Dyspnea    resolved since she stopped smoking   . Epigastric abdominal pain   . Esophageal stricture   . Family history of adverse reaction to anesthesia    daughter has N&V, takes long time to wake up   . Family history of colon cancer   . Family history of ovarian cancer   . Family hx of colon cancer   . 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 Ganglion cyst   . GERD (gastroesophageal reflux disease)   . Headache    low grade currently , family history of migraines   . Hemorrhoid   . History of radiation therapy 05/03 - 08/11/2016   1. 4 field Right breast was treated to 50.4 Gy in 25 fractions at 1.8 Gy per fraction. 2. The Right breast was boosted to 10 Gy in 5 fractions at 2 Gy per fraction.  . Hypothyroidism   . Plantar fasciitis, bilateral   . PONV (postoperative nausea and vomiting)    gets anxious with the mask on her face, also remarks that the scop. patch has helped in the past       Past Surgical History:  Procedure Laterality Date  . ABDOMINAL HYSTERECTOMY    . BREAST RECONSTRUCTION WITH PLACEMENT OF TISSUE EXPANDER AND FLEX HD (ACELLULAR HYDRATED DERMIS) Bilateral 05/19/2016   Procedure: BREAST  RECONSTRUCTION WITH PLACEMENT OF TISSUE EXPANDER AND ALLODERM PLACEMENT;  Surgeon: Irene Limbo, MD;  Location: [facility];  Service: Plastics;  Laterality: Bilateral;  . CESAREAN SECTION  1988  . CHOLECYSTECTOMY    . HEMORRHOID SURGERY    . LAPAROSCOPIC BILATERAL SALPINGECTOMY N/A 10/26/2012   Procedure: operative laparoscopy with lysis of adhesions;  Surgeon: Thornell Sartorius, MD;  Location: [facility];  Service: Gynecology;  Laterality: N/A;  . MASTECTOMY Bilateral   . MASTECTOMY WITH RADIOACTIVE SEED GUIDED EXCISION AND AXILLARY SENTINEL LYMPH NODE BIOPSY Bilateral 05/19/2016   Procedure: RIGHT SKIN SPARING MASTECTOMY WITH RIGHT RADIOACTIVE SEED TARGETED DISSECTION AND RIGHT SENTINEL LYMPH NODE BIOPSY, LEFT PROPHYLACTIC SKIN SPARING MASTECTOMY;  Surgeon: Rolm Bookbinder, MD;  Location: [facility];  Service: General;  Laterality: Bilateral;  . REMOVAL OF BILATERAL TISSUE EXPANDERS WITH PLACEMENT OF BILATERAL BREAST IMPLANTS Bilateral 01/29/2017   Procedure: REMOVAL OF BILATERAL TISSUE EXPANDERS WITH PLACEMENT OF BILATERAL SILICONE BREAST IMPLANTS, ALLODERM TO LEFT BREAST RECONSTRUCTION;  RIGHT LATISSUMUS FLAP;  Surgeon: Irene Limbo, MD;  Location: [facility];  Service: Plastics;  Laterality: Bilateral;  Requesting RNFA  . TONSILLECTOMY    . TUBAL LIGATION    . urologic surgery for ureteropelvic junction obstruction      Social History   Socioeconomic History  . Marital status: Married    Spouse name: None  . Number of children: 2  . Years of education: 108  . Highest education level: None  Social Needs  . Financial resource strain: None  . Food insecurity - worry: None  . Food insecurity - inability: None  . Transportation needs - medical: None  . Transportation needs - non-medical: None  Occupational History  . Occupation: Passenger transport manager, Games developer: [employer]  Tobacco Use  . Smoking status: Former Smoker    Packs/day: 0.50    Years:  15.00    Pack years: 7.50    Types: Cigarettes    Last attempt to quit: 04/08/2016    Years since quitting: 0.9  . Smokeless tobacco: Never Used  Substance and Sexual Activity  . Alcohol use: Yes    Alcohol/week: 8.4 oz    Types: 12 Cans of beer, 2 Standard drinks or equivalent per week    Comment: moderate, daily   . Drug use: No  . Sexual activity: Yes    Partners: Male    Birth control/protection: Surgical  Other Topics Concern  . None  Social History Narrative   HSG, some community college. .Married -'81.   2 daughters  '83, '88 daughter  with bipolar dz. Has had behavior issues. 1 granddaughter '05 living with her. Occupation:Bank worker. Marriage in good. No history of abuse.                    FAMILY HISTORY:  We obtained a detailed, 4-generation family history.  Significant diagnoses are listed below: Family History  Problem Relation Age of Onset  . COPD Mother   . Ovarian cancer Mother 78       surgery, no chemo  . Kidney disease Mother   . Diabetes Father   . Heart disease Father   . Hypertension Father   . Colon cancer Father 9  . Other Father        Amyloidosis  . Cancer Father        2nd cancer later in life, type unk  . Hypertension Sister   . Depression Sister   . Arthritis Sister   . Von Willebrand disease Sister   . Breast cancer Paternal Grandmother        pt doesn't remember, but her sister says this grandmother had br cancer  . Goiter Paternal Grandmother   . Penile cancer Paternal Uncle 70  . Rheum arthritis Maternal Grandmother    [name] has a 20 year old daughter who has had a tubal pregnancy and bone spurs on her back but no history of cancer.  This daughter has a 54 year-old daughter.  [name] also has a 35 year-old daughter who she reports has 'rolie veins' and no history of cancer.  This daughter has a daughter and 2 sons. [name]  has a 20 year-old sister who has Von Willebrand Disease and no history of cancer.   [name]  father was diagnosed with colon cancer in his late 18's.  He had Amyloidosis and had another cancer later in his life, but the type is unknown.  He also had heart failure.  [name] has 3 paternal uncles.  One paternal uncle died of penile cancer in his 32's.  This uncle had a son and a daughter with no known history of cancer.  [name] other 2 uncles have no history of cancer.  None of her paternal cousins have any history of cancer.  [name]'s paternal grandfather died in his 71's with no history of cancer. Her paternal grandmother had breast cancer (patient does not remember her having this cancer, but reports here sister says she had breast cancer).  This grandmother also had a thyroid goiter.   [name] mother had ovarian cancer in her 20's.  She had surgery, but Ms. Thackston does not remember her having chemotherapy.  When we asked if this might have been uterine cancer, the patient was unsure.  Her mother also had kidney disease and is now in her 45's.  [name] mother was adopted so very little is known about her family history .  [name] only knows that her maternal grandmother died at 46 from crippling rheumatoid arthritis.   [name] is unaware of previous family history of genetic testing for hereditary cancer risks. Patient's maternal ancestors are of Italian/Norther European descent, and paternal ancestors are of Northern European/Native American descent. There is no reported Ashkenazi Jewish ancestry. There is no known consanguinity.  GENETIC COUNSELING ASSESSMENT: [name] is a 57 y.o. female with a personal and family history which is somewhat suggestive of a Hereditary Cancer Predisposition Syndrome. We, therefore, discussed and recommended the following at today's visit.   DISCUSSION: We reviewed the characteristics, features and inheritance patterns of hereditary cancer syndromes. We also discussed genetic testing, including the appropriate family members to test,  the process of testing, insurance coverage and turn-around-time for results. We discussed the implications of a negative, positive and/or variant of uncertain significant result. We recommended [name] pursue genetic testing for the Common Hereditary Cancer gene panel.   We discussed that only 5-10% of cancers are associated with a Hereditary cancer predisposition syndrome.  One of the most common hereditary cancer syndromes that increases breast cancer risk is called Hereditary Breast and Ovarian Cancer (HBOC) syndrome.  This syndrome is caused by mutations in the BRCA1 and BRCA2 genes.  This syndrome increases an individual's lifetime risk to develop breast, ovarian, pancreatic, and other types of cancer.  There are also many other cancer predisposition syndromes caused by mutations in several other genes.  For example her father's history of colon cancer <50 may indicate a hereditary predisposition to colon cancer such as Lynch Syndrome.   We discussed that if she is found to have a mutation in one of these genes, it may impact future medical management recommendations such as increased cancer screenings and consideration of risk reducing surgeries.  A positive result could also have implications for the patient's family members.  A Negative result would mean we were unable to identify a hereditary component to her cancer, but does not rule out the possibility of a hereditary basis for her or her family's cancer.  There could be mutations that are undetectable by current technology, or in genes not yet tested or identified to increase  cancer risk.    We discussed the potential to find a Variant of Uncertain Significance or VUS.  These are variants that have not yet been identified as pathogenic or benign, and it is unknown if this variant is associated with increased cancer risk or if this is a normal finding.  Most VUS's are reclassified to benign or likely benign.   It should not be used to make  medical management decisions. With time, we suspect the lab will determine the significance of any VUS's identified if any.   Based on [name] personal and family history of cancer, she meets medical criteria for genetic testing. Despite that she meets criteria, she may still have an out of pocket cost. We discussed that if her out of pocket cost for testing is over $100, the laboratory will call and confirm whether she wants to proceed with testing.  If the out of pocket cost of testing is less than $100 she will be billed by the genetic testing laboratory.   Ms. Molden was also concerned about the potential for her and her family to have [place].  Her sister was diagnosis with this condition and Ms. Moura reports she had several issues with bleeding/excessive blood drainage after her breast surgery as well as excessive bruising after a hand IV.  She is concerned she may have this bleeding disorder and is concerned her daughters and other relatives could also be at risk.  We briefly discussed that VonWillebrand's disease can be geneticaly inherited.  However the genetic test we are able to offer her in our cancer genetics clinic is specifically looking for genes associated with caner risk only.  We cannot order a test for VonWillebrand's disease genetic testing today for her.  However, we recommended she discuss this with her doctors and a referral to hematology may be warranted to further investigate this issue for her.  In reviewing her notes, it appears that Thedore Mins and Dr. Burr Medico are aware of this issue and plan to draw labs to investigate further.   PLAN: After considering the risks, benefits, and limitations, Ms. Parker  provided informed consent to pursue genetic testing and the blood sample was sent to [facility] for analysis of the Common Hereditary Cancer Panel. Results should be available within approximately 2-3 weeks' time, at which point they will be disclosed  by telephone to Ms. Laurance Flatten, as will any additional recommendations warranted by these results. Ms. Laffey will receive a summary of her genetic counseling visit and a copy of her results once available. This information will also be available in Epic. We encouraged Ms. Trott to remain in contact with cancer genetics annually so that we can continuously update the family history and inform her of any changes in cancer genetics and testing that may be of benefit for her family. Ms. Plante questions were answered to her satisfaction today. Our contact information was provided should additional questions or concerns arise.  Based on Ms. Gasner's family history, we recommended her sister and all paternal and maternal relatives (if mother's cancer was ovarian), have genetic counseling and testing. Ms. Stohr will let us know if we can be of any assistance in coordinating genetic counseling and/or testing for this family member.   Lastly, we encouraged Ms. Kruse to remain in contact with cancer genetics annually so that we can continuously update the family history and inform her of any changes in cancer genetics and testing that may be of benefit for this family.  Ms.  Dabbs questions were answered to her satisfaction today. Our contact information was provided should additional questions or concerns arise. Thank you for the referral and allowing [place] to share in the care of your patient.   Tana Felts, MS Genetic Counselor [email or web address] phone: [phone]  The patient was seen for a total of 40 minutes in face-to-face genetic counseling.  This patient was discussed with Drs. Magrinat, Lindi Adie and/or Burr Medico who agrees with the above.

## 2017-04-06 ENCOUNTER — Other Ambulatory Visit: Payer: Self-pay

## 2017-04-06 ENCOUNTER — Ambulatory Visit: Payer: BLUE CROSS/BLUE SHIELD | Attending: Plastic Surgery | Admitting: Physical Therapy

## 2017-04-06 DIAGNOSIS — M62838 Other muscle spasm: Secondary | ICD-10-CM | POA: Diagnosis not present

## 2017-04-06 DIAGNOSIS — R293 Abnormal posture: Secondary | ICD-10-CM | POA: Diagnosis not present

## 2017-04-06 DIAGNOSIS — M6281 Muscle weakness (generalized): Secondary | ICD-10-CM

## 2017-04-06 DIAGNOSIS — M79601 Pain in right arm: Secondary | ICD-10-CM

## 2017-04-06 NOTE — Therapy (Signed)
[place], [place], [address] Phone: [phone]   Fax:  [phone]  Physical Therapy Evaluation  Patient Details  Name: [name] MRN: [id number] Date of Birth: 19-Nov-1960 Referring Provider: Dr. Iran Planas    Encounter Date: 04/06/2017  PT End of Session - 04/06/17 1729    Visit Number  1    Number of Visits  9    Date for PT Re-Evaluation  05/07/17    PT Start Time  1440    PT Stop Time  1520    PT Time Calculation (min)  40 min    Activity Tolerance  Patient tolerated treatment well    Behavior During Therapy  [facility] for tasks assessed/performed       Past Medical History:  Diagnosis Date  . Anxiety   . Anxiety disorder   . Asthma    h/o asthma as a child  . Breast cancer ([place])   . Cancer ([place]) 04/2016   right breast cancer  . Cholelithiasis   . Chronic kidney disease    obstruction of R kidney, ( not a stone) - currently resolved   . Depression   . Diverticulosis   . DJD (degenerative joint disease)    hands & back   . Dyspnea    resolved since she stopped smoking   . Epigastric abdominal pain   . Esophageal stricture   . Family history of adverse reaction to anesthesia    daughter has N&V, takes long time to wake up   . Family history of colon cancer   . Family history of ovarian cancer   . Family hx of colon cancer   . 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 Ganglion cyst   . GERD (gastroesophageal reflux disease)   . Headache    low grade currently , family history of migraines   . Hemorrhoid   . History of radiation therapy 05/03 - 08/11/2016   1. 4 field Right breast was treated to 50.4 Gy in 25 fractions at 1.8 Gy per fraction. 2. The Right breast was boosted to 10 Gy in 5 fractions at 2 Gy per fraction.  . Hypothyroidism   . Plantar fasciitis, bilateral   . PONV (postoperative nausea and vomiting)    gets anxious with the mask on her face, also remarks  that the scop. patch has helped in the past       Past Surgical History:  Procedure Laterality Date  . ABDOMINAL HYSTERECTOMY    . BREAST RECONSTRUCTION WITH PLACEMENT OF TISSUE EXPANDER AND FLEX HD (ACELLULAR HYDRATED DERMIS) Bilateral 05/19/2016   Procedure: BREAST RECONSTRUCTION WITH PLACEMENT OF TISSUE EXPANDER AND ALLODERM PLACEMENT;  Surgeon: Irene Limbo, MD;  Location: [place];  Service: Plastics;  Laterality: Bilateral;  . CESAREAN SECTION  1988  . CHOLECYSTECTOMY    . HEMORRHOID SURGERY    . LAPAROSCOPIC BILATERAL SALPINGECTOMY N/A 10/26/2012   Procedure: operative laparoscopy with lysis of adhesions;  Surgeon: Thornell Sartorius, MD;  Location: [facility];  Service: Gynecology;  Laterality: N/A;  . MASTECTOMY Bilateral   . MASTECTOMY WITH RADIOACTIVE SEED GUIDED EXCISION AND AXILLARY SENTINEL LYMPH NODE BIOPSY Bilateral 05/19/2016   Procedure: RIGHT SKIN SPARING MASTECTOMY WITH RIGHT RADIOACTIVE SEED TARGETED DISSECTION AND RIGHT SENTINEL LYMPH NODE BIOPSY, LEFT PROPHYLACTIC SKIN SPARING MASTECTOMY;  Surgeon: Rolm Bookbinder, MD;  Location: [place];  Service: General;  Laterality: Bilateral;  . REMOVAL OF BILATERAL TISSUE EXPANDERS WITH PLACEMENT OF BILATERAL  BREAST IMPLANTS Bilateral 01/29/2017   Procedure: REMOVAL OF BILATERAL TISSUE EXPANDERS WITH PLACEMENT OF BILATERAL SILICONE BREAST IMPLANTS, ALLODERM TO LEFT BREAST RECONSTRUCTION;  RIGHT LATISSUMUS FLAP;  Surgeon: Irene Limbo, MD;  Location: [place] [facility];  Service: Plastics;  Laterality: Bilateral;  Requesting RNFA  . TONSILLECTOMY    . TUBAL LIGATION    . urologic surgery for ureteropelvic junction obstruction      There were no vitals filed for this visit.   Subjective Assessment - 04/06/17 1446    Subjective  Pt had another surgery in December for latt flap with silicone implant and implant on the right. Pt said that she had a lot of trouble getting the drainage tubes removed.   Pt feels alot  of tightness under her right breast and in her right side     Pertinent History  breast cancer . Dad and sister have a rare blood disorder that pt is being evaluated    Patient Stated Goals  decrease the pain on the right side and get looser on the right side     Currently in Pain?  Yes    Pain Score  10-Worst pain ever    Pain Location  Rib cage    Pain Orientation  Left    Pain Radiating Towards  right side     Pain Onset  More than a month ago    Pain Frequency  Intermittent    Aggravating Factors   activity     Pain Relieving Factors  not doing anything     Effect of Pain on Daily Activities  pt cannot vacuum for more that a few minutes, cannot get comfortable in the bed so she can't get the rest that she needs          [facility] PT Assessment - 04/06/17 0001      Assessment   Medical Diagnosis  s/p bilateral mastectomy with right 7 nodes removed on the right      Referring Provider  Dr. Iran Planas     Onset Date/Surgical Date  05/19/16    Hand Dominance  Right    Prior Therapy  -- yes      Balance Screen   Has the patient fallen in the past 6 months  Yes will not be addressed this session     How many times?  1    Has the patient had a decrease in activity level because of a fear of falling?   No    Is the patient reluctant to leave their home because of a fear of falling?   No      Home Film/video editor residence    Living Arrangements  Spouse/significant other;Other relatives husband and 14 y.o. 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 on right and  underneat the arm     Other Surveys   Other Surveys    Quick DASH   63.64      Sensation  Additional Comments  occasionally feels like iits numb.  occasionally tingling on back of right arm       Coordination   Gross Motor Movements are Fluid and Coordinated  Yes      Posture/Postural Control   Posture/Postural Control  Postural limitations    Postural Limitations  Forward head;Rounded Shoulders      ROM / Strength   AROM / PROM / Strength  AROM      AROM   AROM Assessment Site  Shoulder    Right/Left Shoulder  Right;Left    Right Shoulder Flexion  165 Degrees    Right Shoulder ABduction  165 Degrees tighness and pulling under her right breast     Left Shoulder Flexion  175 Degrees    Left Shoulder ABduction  170 Degrees      Strength   Overall Strength  Deficits pt limited by pain in right side , generalized deconditionin      Palpation   Palpation comment  very tight and tender under right breast and at right lateral trunk         LYMPHEDEMA/ONCOLOGY QUESTIONNAIRE - 04/06/17 1508      Right Upper Extremity Lymphedema   15 cm Proximal to Olecranon Process  32 cm    10 cm Proximal to Olecranon Process  30 cm    Olecranon Process  26.7 cm    15 cm Proximal to Ulnar Styloid Process  26 cm    10 cm Proximal to Ulnar Styloid Process  23.5 cm    Just Proximal to Ulnar Styloid Process  16.1 cm    Across Hand at [employer]  20 cm    At [place] of 2nd Digit  6 cm      Left Upper Extremity Lymphedema   15 cm Proximal to Olecranon Process  32 cm    10 cm Proximal to Olecranon Process  30 cm    Olecranon Process  26 cm    15 cm Proximal to Ulnar Styloid Process  25 cm    10 cm Proximal to Ulnar Styloid Process  23 cm    Just Proximal to Ulnar Styloid Process  16.1 cm    Across Hand at [employer]  19.5 cm    At [place] of 2nd Digit  6 cm        Quick Dash - 04/06/17 0001    Open a tight or new jar  Severe difficulty    Do heavy household chores (wash walls, wash  floors)  Severe difficulty    Carry a shopping bag or briefcase  Moderate difficulty    Wash your back  Severe difficulty    Use a knife to cut food  Mild difficulty    Recreational activities in which you take some force or impact through your arm, shoulder, or hand (golf, hammering, tennis)  Severe difficulty    During the past week, to what extent has your arm, shoulder or hand problem interfered with your normal social activities with family, friends, neighbors, or groups?  Modererately    During the past week, to what extent has your arm, shoulder or hand problem limited your work or other regular daily activities  Slightly    Arm, shoulder, or hand pain.  Extreme    Tingling (pins and needles) in your arm, shoulder, or hand  Severe    Difficulty Sleeping  Severe difficulty    DASH Score  63.64 %  Objective measurements completed on examination: See above findings.                PT Short Term Goals - 04/02/16 1329      PT SHORT TERM GOAL #1   Title  independent with initial HEP    Time  4    Period  Weeks    Status  New      PT SHORT TERM GOAL #2   Title  full movement of the ribcage and diaphgram so she is able to exercise    Time  4    Period  Weeks    Status  New      PT SHORT TERM GOAL #3   Title  pain in right flank decreased >/= 25% due to increased tissue mobility    Time  4    Period  Weeks    Status  New      PT SHORT TERM GOAL #4   Title  understand how to perform abdominal massage to reduce constipation with pain medicaiton     Time  4    Period  Weeks    Status  New        PT Long Term Goals - 04/06/17 1739      PT LONG TERM GOAL #1   Title  Pt will report her pain  and tightness in her right upper quadrant is decreased by 50% so that she can perfrom her activites of daily living easier     Time  4    Period  Weeks    Status  New      PT LONG TERM GOAL #2   Title  Pt will be independent in a home exercise program for core  strengthening and stabiltiy sothat she can turn her body to look around while she is driving without pain     Time  4    Period  Weeks    Status  New      Breast Clinic Goals - 04/01/16 1559      Patient will be able to verbalize understanding of pertinent lymphedema risk reduction practices relevant to her diagnosis specifically related to skin care.   Time  1    Period  Days    Status  Achieved      Patient will be able to return demonstrate and/or verbalize understanding of the post-op home exercise program related to regaining shoulder range of motion.   Time  1    Period  Days    Status  Achieved      Patient will be able to verbalize understanding of the importance of attending the postoperative After Breast Cancer Class for further lymphedema risk reduction education and therapeutic exercise.   Time  1    Period  Days    Status  Achieved       Long Term Clinic Goals - 09/17/16 1058      CC Long Term Goal  #1   Title  Patient will be independent in her home exercise program for neural stretching and ROM.    Time  4    Period  Weeks    Status  Achieved      CC Long Term Goal  #2   Title  Patient will demonstrate >/= 165 degrees active flexion and abduction to increased ease of movement.    Time  4    Period  Weeks    Status  Achieved  CC Long Term Goal  #3   Title  Patient will report >/= 50% improvement in right arm pain.    Baseline  Patient reports her pain overall has improved 30%.    Time  4    Period  Weeks      CC Long Term Goal  #4   Title  Patient will report she is able to do >/= 75% of work tasks without increased pain.    Baseline  Has not yet returned to work.    Time  4    Period  Weeks    Status  Not Met          Plan - 04/06/17 1730    Clinical Impression Statement  Pt returns after having latt flap surgery with implant on the right side and implant on the left side.  She has full shoulder range of motion and not signs of lymphedema  she wants to have improvement in her pain and tightness on her right side     History and Personal Factors relevant to plan of care:  multiple previous surgeries and treatments    Clinical Presentation  Stable    Clinical Presentation due to:  no more treatment     Clinical Decision Making  Low    Rehab Potential  Good    Clinical Impairments Affecting Rehab Potential  7 nodes removed and radition  on the right     PT Frequency  2x / week    PT Duration  4 weeks    PT Treatment/Interventions  ADLs/Self Care Home Management;Therapeutic exercise;Therapeutic activities;Patient/family education;Manual techniques;Manual lymph drainage;Scar mobilization;Passive range of motion;Taping    PT Next Visit Plan  manual techniques for stretching on right side, intruct in core strengthening exdercises     Consulted and Agree with Plan of Care  Patient       Patient will benefit from skilled therapeutic intervention in order to improve the following deficits and impairments:  Postural dysfunction, Decreased knowledge of precautions, Pain, Impaired UE functional use, Decreased range of motion, Increased muscle spasms, Decreased scar mobility  Visit Diagnosis: Pain in right arm - Plan: PT plan of care cert/re-cert  Other muscle spasm - Plan: PT plan of care cert/re-cert  Abnormal posture - Plan: PT plan of care cert/re-cert  Muscle weakness (generalized) - Plan: PT plan of care cert/re-cert     Problem List Patient Active Problem List   Diagnosis Date Noted  . Family history of ovarian cancer   . Family history of colon cancer   . Acquired absence of breast and nipple 01/29/2017  . Breast cancer, right ([place]) 05/19/2016  . Malignant neoplasm of overlapping sites of right breast in female, estrogen receptor positive ([place]) 03/31/2016  . 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 Hydrosalpinx 10/25/2012  . Sciatica of right side 03/31/2012  . Routine general medical examination at a health  care facility 08/27/2010  . SPONTANEOUS ECCHYMOSES 01/03/2009  . ANXIETY DISORDER 07/04/2007  . HYPOTHYROIDISM 06/23/2007  . HEMORRHOIDS 06/23/2007  . ESOPHAGEAL STRICTURE 06/23/2007  . GERD 06/23/2007  . DEGENERATIVE JOINT DISEASE 06/23/2007  . ARTHRITIS 06/23/2007  . SLEEP APNEA 06/23/2007   Donato Heinz. Owens Shark PT  [facility] 04/06/2017, [place], [place], [address] Phone: [phone]   Fax:  [phone]  Name: [name] MRN: [id number] Date of Birth: [birth year]

## 2017-04-13 DIAGNOSIS — J019 Acute sinusitis, unspecified: Secondary | ICD-10-CM | POA: Diagnosis not present

## 2017-04-14 ENCOUNTER — Ambulatory Visit: Payer: BLUE CROSS/BLUE SHIELD

## 2017-04-19 ENCOUNTER — Ambulatory Visit: Payer: BLUE CROSS/BLUE SHIELD | Admitting: Physical Therapy

## 2017-04-19 ENCOUNTER — Encounter: Payer: Self-pay | Admitting: Physical Therapy

## 2017-04-19 DIAGNOSIS — M79601 Pain in right arm: Secondary | ICD-10-CM | POA: Diagnosis not present

## 2017-04-19 DIAGNOSIS — M62838 Other muscle spasm: Secondary | ICD-10-CM | POA: Diagnosis not present

## 2017-04-19 DIAGNOSIS — R293 Abnormal posture: Secondary | ICD-10-CM | POA: Diagnosis not present

## 2017-04-19 DIAGNOSIS — M6281 Muscle weakness (generalized): Secondary | ICD-10-CM | POA: Diagnosis not present

## 2017-04-19 NOTE — Patient Instructions (Signed)

## 2017-04-19 NOTE — Therapy (Signed)
[place], [place], [address] Phone: [phone]   Fax:  [phone]  Physical Therapy Treatment  Patient Details  Name: [name] MRN: [id number] Date of Birth: May 12, 1960 Referring Provider: Dr. Iran Planas    Encounter Date: 04/19/2017  PT End of Session - 04/19/17 1653    Visit Number  2    Number of Visits  9    Date for PT Re-Evaluation  05/07/17    PT Start Time  5102    PT Stop Time  1648    PT Time Calculation (min)  43 min    Activity Tolerance  Patient tolerated treatment well    Behavior During Therapy  [facility] for tasks assessed/performed       Past Medical History:  Diagnosis Date  . Anxiety   . Anxiety disorder   . Asthma    h/o asthma as a child  . Breast cancer ([place])   . Cancer ([place]) 04/2016   right breast cancer  . Cholelithiasis   . Chronic kidney disease    obstruction of R kidney, ( not a stone) - currently resolved   . Depression   . Diverticulosis   . DJD (degenerative joint disease)    hands & back   . Dyspnea    resolved since she stopped smoking   . Epigastric abdominal pain   . Esophageal stricture   . Family history of adverse reaction to anesthesia    daughter has N&V, takes long time to wake up   . Family history of colon cancer   . Family history of ovarian cancer   . Family hx of colon cancer   . 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 Ganglion cyst   . GERD (gastroesophageal reflux disease)   . Headache    low grade currently , family history of migraines   . Hemorrhoid   . History of radiation therapy 05/03 - 08/11/2016   1. 4 field Right breast was treated to 50.4 Gy in 25 fractions at 1.8 Gy per fraction. 2. The Right breast was boosted to 10 Gy in 5 fractions at 2 Gy per fraction.  . Hypothyroidism   . Plantar fasciitis, bilateral   . PONV (postoperative nausea and vomiting)    gets anxious with the mask on her face, also remarks that  the scop. patch has helped in the past       Past Surgical History:  Procedure Laterality Date  . ABDOMINAL HYSTERECTOMY    . BREAST RECONSTRUCTION WITH PLACEMENT OF TISSUE EXPANDER AND FLEX HD (ACELLULAR HYDRATED DERMIS) Bilateral 05/19/2016   Procedure: BREAST RECONSTRUCTION WITH PLACEMENT OF TISSUE EXPANDER AND ALLODERM PLACEMENT;  Surgeon: Irene Limbo, MD;  Location: [facility];  Service: Plastics;  Laterality: Bilateral;  . CESAREAN SECTION  1988  . CHOLECYSTECTOMY    . HEMORRHOID SURGERY    . LAPAROSCOPIC BILATERAL SALPINGECTOMY N/A 10/26/2012   Procedure: operative laparoscopy with lysis of adhesions;  Surgeon: Thornell Sartorius, MD;  Location: [facility];  Service: Gynecology;  Laterality: N/A;  . MASTECTOMY Bilateral   . MASTECTOMY WITH RADIOACTIVE SEED GUIDED EXCISION AND AXILLARY SENTINEL LYMPH NODE BIOPSY Bilateral 05/19/2016   Procedure: RIGHT SKIN SPARING MASTECTOMY WITH RIGHT RADIOACTIVE SEED TARGETED DISSECTION AND RIGHT SENTINEL LYMPH NODE BIOPSY, LEFT PROPHYLACTIC SKIN SPARING MASTECTOMY;  Surgeon: Rolm Bookbinder, MD;  Location: [place];  Service: General;  Laterality: Bilateral;  . REMOVAL OF BILATERAL TISSUE EXPANDERS WITH PLACEMENT OF BILATERAL  BREAST IMPLANTS Bilateral 01/29/2017   Procedure: REMOVAL OF BILATERAL TISSUE EXPANDERS WITH PLACEMENT OF BILATERAL SILICONE BREAST IMPLANTS, ALLODERM TO LEFT BREAST RECONSTRUCTION;  RIGHT LATISSUMUS FLAP;  Surgeon: Irene Limbo, MD;  Location: [facility];  Service: Plastics;  Laterality: Bilateral;  Requesting RNFA  . TONSILLECTOMY    . TUBAL LIGATION    . urologic surgery for ureteropelvic junction obstruction      There were no vitals filed for this visit.  Subjective Assessment - 04/19/17 1607    Subjective  I have so much tightness under my right breast.     Pertinent History  breast cancer . Dad and sister have a rare blood disorder that pt is being evaluated    Patient Stated Goals  decrease the  pain on the right side and get looser on the right side     Currently in Pain?  Yes    Pain Score  7     Pain Location  Breast    Pain Orientation  Right;Lower    Pain Descriptors / Indicators  Numbness                      [facility] Adult PT Treatment/Exercise - 04/19/17 0001      Shoulder Exercises: Supine   Protraction  AROM;Right;10 reps    Horizontal ABduction  Strengthening;Both;10 reps;Theraband    Theraband Level (Shoulder Horizontal ABduction)  Level 2 (Red)    External Rotation  Strengthening;Both;10 reps;Theraband    Theraband Level (Shoulder External Rotation)  Level 2 (Red)    Flexion  AAROM;Both;10 reps;Theraband narrow and wide grip    Other Supine Exercises  D2 x 10 reps bilaterally with red band      Manual Therapy   Manual Therapy  Myofascial release    Myofascial Release  gentle scar massage to area of tight scar tissue in inferior right breast along incision after receiving ok from surgeron                  PT Long Term Goals - 04/06/17 1739      PT LONG TERM GOAL #1   Title  Pt will report her pain  and tightness in her right upper quadrant is decreased by 50% so that she can perfrom her activites of daily living easier     Time  4    Period  Weeks    Status  New      PT LONG TERM GOAL #2   Title  Pt will be independent in a home exercise program for core strengthening and stabiltiy sothat she can turn her body to look around while she is driving without pain     Time  4    Period  Weeks    Status  New            Plan - 04/19/17 1653    Clinical Impression Statement  Sent inbox to pt's surgeon to be sure it was okay to work on scar tissue in inferior breast along incision. Pt's doctor replied saying it was ok to do this and the patient does not have any other restrictions. Began scar massage today and instructed pt supine scapular strengthening exercises today to help decrease pain and weakness associated from surgery.      Rehab Potential  Good    Clinical Impairments Affecting Rehab Potential  7 nodes removed and radition  on the right     PT Frequency  2x / week  PT Duration  4 weeks    PT Treatment/Interventions  ADLs/Self Care Home Management;Therapeutic exercise;Therapeutic activities;Patient/family education;Manual techniques;Manual lymph drainage;Scar mobilization;Passive range of motion;Taping    PT Next Visit Plan  manual techniques for stretching on right side, lat strengthening exercises, supine scap, scar massage intruct in core strengthening exdercises     Consulted and Agree with Plan of Care  Patient       Patient will benefit from skilled therapeutic intervention in order to improve the following deficits and impairments:  Postural dysfunction, Decreased knowledge of precautions, Pain, Impaired UE functional use, Decreased range of motion, Increased muscle spasms, Decreased scar mobility  Visit Diagnosis: Pain in right arm  Other muscle spasm     Problem List Patient Active Problem List   Diagnosis Date Noted  . Family history of ovarian cancer   . Family history of colon cancer   . Acquired absence of breast and nipple 01/29/2017  . Breast cancer, right ([place]) 05/19/2016  . Malignant neoplasm of overlapping sites of right breast in female, estrogen receptor positive ([place]) 03/31/2016  . 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 Hydrosalpinx 10/25/2012  . Sciatica of right side 03/31/2012  . Routine general medical examination at a health care facility 08/27/2010  . SPONTANEOUS ECCHYMOSES 01/03/2009  . ANXIETY DISORDER 07/04/2007  . HYPOTHYROIDISM 06/23/2007  . HEMORRHOIDS 06/23/2007  . ESOPHAGEAL STRICTURE 06/23/2007  . GERD 06/23/2007  . DEGENERATIVE JOINT DISEASE 06/23/2007  . ARTHRITIS 06/23/2007  . SLEEP APNEA 06/23/2007    Allyson Sabal [facility] 04/19/2017, 4:58 PM  [place],  [place], [address] Phone: [phone]   Fax:  [phone]  Name: AMARYLLIS MALMQUIST MRN: [id number] Date of Birth: Jan 13, 1961  Manus Gunning, PT 04/19/17 4:58 PM

## 2017-04-21 ENCOUNTER — Telehealth: Payer: Self-pay | Admitting: Genetics

## 2017-04-21 ENCOUNTER — Ambulatory Visit: Payer: BLUE CROSS/BLUE SHIELD

## 2017-04-21 DIAGNOSIS — R293 Abnormal posture: Secondary | ICD-10-CM | POA: Diagnosis not present

## 2017-04-21 DIAGNOSIS — M62838 Other muscle spasm: Secondary | ICD-10-CM

## 2017-04-21 DIAGNOSIS — M79601 Pain in right arm: Secondary | ICD-10-CM

## 2017-04-21 DIAGNOSIS — M6281 Muscle weakness (generalized): Secondary | ICD-10-CM | POA: Diagnosis not present

## 2017-04-21 NOTE — Telephone Encounter (Signed)
Revealed negative genetic testing.    We discussed that we do not know why she has breast cancer or why there is cancer in the family.  While negative results are reassuring, It could be due to a different gene that we are not testing, or maybe our current technology may not be able to pick something up.  It will be important for her to keep in contact with genetics to learn if additional testing may be needed in the future.  Recommended her sister and other realties (paternal and maternal have genetic testing as well).  Recommended her daughters inform their doctors about the family history so they can make the best recommendations for cancer screening for them.   [name] asked about testing for the blood disorders Von Willebrand (in sister) and Amyloidosis (in father).  I informed her I saw labs ordered for Von Willebrand testing, but was not sure about testing for Amyloidosis and if that is something that could be ordered by her physicians here or not.  She requested I message Dr. Burr Medico to inquire about this- I will pass along her question to her.

## 2017-04-21 NOTE — Therapy (Signed)
[place], [place], [address] Phone: [phone]   Fax:  [phone]  Physical Therapy Treatment  Patient Details  Name: [name] MRN: [id number] Date of Birth: 1960-10-24 Referring Provider: Dr. Iran Planas    Encounter Date: 04/21/2017  PT End of Session - 04/21/17 1700    Visit Number  3    Number of Visits  9    Date for PT Re-Evaluation  05/07/17    PT Start Time  1608    PT Stop Time  1652    PT Time Calculation (min)  44 min    Activity Tolerance  Patient tolerated treatment well    Behavior During Therapy  [facility] for tasks assessed/performed       Past Medical History:  Diagnosis Date  . Anxiety   . Anxiety disorder   . Asthma    h/o asthma as a child  . Breast cancer ([place])   . Cancer ([place]) 04/2016   right breast cancer  . Cholelithiasis   . Chronic kidney disease    obstruction of R kidney, ( not a stone) - currently resolved   . Depression   . Diverticulosis   . DJD (degenerative joint disease)    hands & back   . Dyspnea    resolved since she stopped smoking   . Epigastric abdominal pain   . Esophageal stricture   . Family history of adverse reaction to anesthesia    daughter has N&V, takes long time to wake up   . Family history of colon cancer   . Family history of ovarian cancer   . Family hx of colon cancer   . 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 Ganglion cyst   . GERD (gastroesophageal reflux disease)   . Headache    low grade currently , family history of migraines   . Hemorrhoid   . History of radiation therapy 05/03 - 08/11/2016   1. 4 field Right breast was treated to 50.4 Gy in 25 fractions at 1.8 Gy per fraction. 2. The Right breast was boosted to 10 Gy in 5 fractions at 2 Gy per fraction.  . Hypothyroidism   . Plantar fasciitis, bilateral   . PONV (postoperative nausea and vomiting)    gets anxious with the mask on her face, also remarks that  the scop. patch has helped in the past       Past Surgical History:  Procedure Laterality Date  . ABDOMINAL HYSTERECTOMY    . BREAST RECONSTRUCTION WITH PLACEMENT OF TISSUE EXPANDER AND FLEX HD (ACELLULAR HYDRATED DERMIS) Bilateral 05/19/2016   Procedure: BREAST RECONSTRUCTION WITH PLACEMENT OF TISSUE EXPANDER AND ALLODERM PLACEMENT;  Surgeon: Irene Limbo, MD;  Location: [facility];  Service: Plastics;  Laterality: Bilateral;  . CESAREAN SECTION  1988  . CHOLECYSTECTOMY    . HEMORRHOID SURGERY    . LAPAROSCOPIC BILATERAL SALPINGECTOMY N/A 10/26/2012   Procedure: operative laparoscopy with lysis of adhesions;  Surgeon: Thornell Sartorius, MD;  Location: [facility];  Service: Gynecology;  Laterality: N/A;  . MASTECTOMY Bilateral   . MASTECTOMY WITH RADIOACTIVE SEED GUIDED EXCISION AND AXILLARY SENTINEL LYMPH NODE BIOPSY Bilateral 05/19/2016   Procedure: RIGHT SKIN SPARING MASTECTOMY WITH RIGHT RADIOACTIVE SEED TARGETED DISSECTION AND RIGHT SENTINEL LYMPH NODE BIOPSY, LEFT PROPHYLACTIC SKIN SPARING MASTECTOMY;  Surgeon: Rolm Bookbinder, MD;  Location: [facility];  Service: General;  Laterality: Bilateral;  . REMOVAL OF BILATERAL TISSUE EXPANDERS WITH PLACEMENT OF BILATERAL  BREAST IMPLANTS Bilateral 01/29/2017   Procedure: REMOVAL OF BILATERAL TISSUE EXPANDERS WITH PLACEMENT OF BILATERAL SILICONE BREAST IMPLANTS, ALLODERM TO LEFT BREAST RECONSTRUCTION;  RIGHT LATISSUMUS FLAP;  Surgeon: Irene Limbo, MD;  Location: [facility];  Service: Plastics;  Laterality: Bilateral;  Requesting RNFA  . TONSILLECTOMY    . TUBAL LIGATION    . urologic surgery for ureteropelvic junction obstruction      There were no vitals filed for this visit.  Subjective Assessment - 04/21/17 1611    Subjective  I've been doing the exercises she gave me and I can feel it! I am sore at my Rt shoulder blade area and axilla. The Rt sided rib pain is bothering me today but I have been having some good days  with that lately.     Pertinent History  breast cancer . Dad and sister have a rare blood disorder that pt is being evaluated    Patient Stated Goals  decrease the pain on the right side and get looser on the right side     Currently in Pain?  Yes    Pain Score  8     Pain Location  Rib cage    Pain Orientation  Right    Pain Descriptors / Indicators  Sharp    Pain Type  Chronic pain    Pain Onset  More than a month ago    Pain Frequency  Intermittent    Aggravating Factors   possibly new exercises    Pain Relieving Factors  pain meds                      [facility] Adult PT Treatment/Exercise - 04/21/17 0001      Manual Therapy   Manual Therapy  Myofascial release;Passive ROM    Myofascial Release  gentle scar massage to area of tight scar tissue in inferior right breast along incision; myofascial release to Rt lateral flank area from axilla to lower ribs; also to tightness in axilla    Passive ROM  To Rt shoulder in supine into flexion, abduction and D2 for optimal myofascial release                  PT Long Term Goals - 04/06/17 1739      PT LONG TERM GOAL #1   Title  Pt will report her pain  and tightness in her right upper quadrant is decreased by 50% so that she can perfrom her activites of daily living easier     Time  4    Period  Weeks    Status  New      PT LONG TERM GOAL #2   Title  Pt will be independent in a home exercise program for core strengthening and stabiltiy sothat she can turn her body to look around while she is driving without pain     Time  4    Period  Weeks    Status  New            Plan - 04/21/17 1701    Clinical Impression Statement  Focused on manual therapy to Rt upper quadrant (see flowsheet). Pt tolerated this very well and reported feeling much looser after session today. Did not review HEP isssued last time as pt reported still feeling sore from doing them at home last night.     Rehab Potential  Good    Clinical  Impairments Affecting Rehab Potential  7 nodes removed and radition  on the right     PT Frequency  2x / week    PT Duration  4 weeks    PT Treatment/Interventions  ADLs/Self Care Home Management;Therapeutic exercise;Therapeutic activities;Patient/family education;Manual techniques;Manual lymph drainage;Scar mobilization;Passive range of motion;Taping    PT Next Visit Plan  manual techniques for stretching on right side, include soft tissue work to Rt upper back/scapula area; lat strengthening exercises, review supine scap, scar massage, intruct in core strengthening exdercises     Consulted and Agree with Plan of Care  Patient       Patient will benefit from skilled therapeutic intervention in order to improve the following deficits and impairments:  Postural dysfunction, Decreased knowledge of precautions, Pain, Impaired UE functional use, Decreased range of motion, Increased muscle spasms, Decreased scar mobility  Visit Diagnosis: Pain in right arm  Other muscle spasm     Problem List Patient Active Problem List   Diagnosis Date Noted  . Family history of ovarian cancer   . Family history of colon cancer   . Acquired absence of breast and nipple 01/29/2017  . Breast cancer, right ([place]) 05/19/2016  . Malignant neoplasm of overlapping sites of right breast in female, estrogen receptor positive ([place]) 03/31/2016  . 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 Hydrosalpinx 10/25/2012  . Sciatica of right side 03/31/2012  . Routine general medical examination at a health care facility 08/27/2010  . SPONTANEOUS ECCHYMOSES 01/03/2009  . ANXIETY DISORDER 07/04/2007  . HYPOTHYROIDISM 06/23/2007  . HEMORRHOIDS 06/23/2007  . ESOPHAGEAL STRICTURE 06/23/2007  . GERD 06/23/2007  . DEGENERATIVE JOINT DISEASE 06/23/2007  . ARTHRITIS 06/23/2007  . SLEEP APNEA 06/23/2007    Otelia Limes, PTA 04/21/2017, 5:08 PM  [place], [place], [address] Phone: [phone]   Fax:  [phone]  Name: [name] MRN: [id number] Date of Birth: 1960/10/28

## 2017-04-26 ENCOUNTER — Encounter: Payer: Self-pay | Admitting: Physical Therapy

## 2017-04-26 ENCOUNTER — Ambulatory Visit: Payer: BLUE CROSS/BLUE SHIELD | Attending: Plastic Surgery | Admitting: Physical Therapy

## 2017-04-26 DIAGNOSIS — M25611 Stiffness of right shoulder, not elsewhere classified: Secondary | ICD-10-CM | POA: Insufficient documentation

## 2017-04-26 DIAGNOSIS — R293 Abnormal posture: Secondary | ICD-10-CM

## 2017-04-26 DIAGNOSIS — M6281 Muscle weakness (generalized): Secondary | ICD-10-CM

## 2017-04-26 DIAGNOSIS — M79601 Pain in right arm: Secondary | ICD-10-CM | POA: Insufficient documentation

## 2017-04-26 DIAGNOSIS — M62838 Other muscle spasm: Secondary | ICD-10-CM | POA: Insufficient documentation

## 2017-04-26 NOTE — Therapy (Signed)
[place], [place], [address] Phone: [phone]   Fax:  [phone]  Physical Therapy Treatment  Patient Details  Name: [name] MRN: [id number] Date of Birth: [birth year] Referring Provider: Dr. Iran Planas    Encounter Date: 04/26/2017  PT End of Session - 04/26/17 1653    Visit Number  4    Number of Visits  9    Date for PT Re-Evaluation  05/07/17    PT Start Time  1603    PT Stop Time  1645    PT Time Calculation (min)  42 min    Activity Tolerance  Patient tolerated treatment well    Behavior During Therapy  [facility] for tasks assessed/performed       Past Medical History:  Diagnosis Date  . Anxiety   . Anxiety disorder   . Asthma    h/o asthma as a child  . Breast cancer ([place])   . Cancer ([place]) 04/2016   right breast cancer  . Cholelithiasis   . Chronic kidney disease    obstruction of R kidney, ( not a stone) - currently resolved   . Depression   . Diverticulosis   . DJD (degenerative joint disease)    hands & back   . Dyspnea    resolved since she stopped smoking   . Epigastric abdominal pain   . Esophageal stricture   . Family history of adverse reaction to anesthesia    daughter has N&V, takes long time to wake up   . Family history of colon cancer   . Family history of ovarian cancer   . Family hx of colon cancer   . 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 Ganglion cyst   . GERD (gastroesophageal reflux disease)   . Headache    low grade currently , family history of migraines   . Hemorrhoid   . History of radiation therapy 05/03 - 08/11/2016   1. 4 field Right breast was treated to 50.4 Gy in 25 fractions at 1.8 Gy per fraction. 2. The Right breast was boosted to 10 Gy in 5 fractions at 2 Gy per fraction.  . Hypothyroidism   . Plantar fasciitis, bilateral   . PONV (postoperative nausea and vomiting)    gets anxious with the mask on her face, also remarks that  the scop. patch has helped in the past       Past Surgical History:  Procedure Laterality Date  . ABDOMINAL HYSTERECTOMY    . BREAST RECONSTRUCTION WITH PLACEMENT OF TISSUE EXPANDER AND FLEX HD (ACELLULAR HYDRATED DERMIS) Bilateral 05/19/2016   Procedure: BREAST RECONSTRUCTION WITH PLACEMENT OF TISSUE EXPANDER AND ALLODERM PLACEMENT;  Surgeon: Irene Limbo, MD;  Location: [facility];  Service: Plastics;  Laterality: Bilateral;  . CESAREAN SECTION  1988  . CHOLECYSTECTOMY    . HEMORRHOID SURGERY    . LAPAROSCOPIC BILATERAL SALPINGECTOMY N/A 10/26/2012   Procedure: operative laparoscopy with lysis of adhesions;  Surgeon: Thornell Sartorius, MD;  Location: [facility];  Service: Gynecology;  Laterality: N/A;  . MASTECTOMY Bilateral   . MASTECTOMY WITH RADIOACTIVE SEED GUIDED EXCISION AND AXILLARY SENTINEL LYMPH NODE BIOPSY Bilateral 05/19/2016   Procedure: RIGHT SKIN SPARING MASTECTOMY WITH RIGHT RADIOACTIVE SEED TARGETED DISSECTION AND RIGHT SENTINEL LYMPH NODE BIOPSY, LEFT PROPHYLACTIC SKIN SPARING MASTECTOMY;  Surgeon: Rolm Bookbinder, MD;  Location: [facility];  Service: General;  Laterality: Bilateral;  . REMOVAL OF BILATERAL TISSUE EXPANDERS WITH PLACEMENT OF BILATERAL  BREAST IMPLANTS Bilateral 01/29/2017   Procedure: REMOVAL OF BILATERAL TISSUE EXPANDERS WITH PLACEMENT OF BILATERAL SILICONE BREAST IMPLANTS, ALLODERM TO LEFT BREAST RECONSTRUCTION;  RIGHT LATISSUMUS FLAP;  Surgeon: Irene Limbo, MD;  Location: [facility];  Service: Plastics;  Laterality: Bilateral;  Requesting RNFA  . TONSILLECTOMY    . TUBAL LIGATION    . urologic surgery for ureteropelvic junction obstruction      There were no vitals filed for this visit.  Subjective Assessment - 04/26/17 1606    Subjective  It is still kinda tight but she really worked me good at the last session.     Pertinent History  breast cancer . Dad and sister have a rare blood disorder that pt is being evaluated    Patient  Stated Goals  decrease the pain on the right side and get looser on the right side     Currently in Pain?  Yes    Pain Score  6     Pain Location  Rib cage    Pain Orientation  Right                      [facility] Adult PT Treatment/Exercise - 04/26/17 0001      Shoulder Exercises: Sidelying   Other Sidelying Exercises  scapular retraction with red band x 20 reps with verbal cues to squeeze shoulders toether and down and keep elbows at sides    Other Sidelying Exercises  low rows with red theraband x 20 reps with cues to keep elbows straight and at sides      Manual Therapy   Manual Therapy  Myofascial release;Passive ROM    Myofascial Release  gentle scar massage to area of tight scar tissue in inferior right breast along incision; myofascial release to Rt lateral flank area from axilla to lower ribs; also to tightness in axilla    Passive ROM  To Rt shoulder in supine into flexion, abduction and D2 for optimal myofascial release                  PT Long Term Goals - 04/06/17 1739      PT LONG TERM GOAL #1   Title  Pt will report her pain  and tightness in her right upper quadrant is decreased by 50% so that she can perfrom her activites of daily living easier     Time  4    Period  Weeks    Status  New      PT LONG TERM GOAL #2   Title  Pt will be independent in a home exercise program for core strengthening and stabiltiy sothat she can turn her body to look around while she is driving without pain     Time  4    Period  Weeks    Status  New            Plan - 04/26/17 1653    Clinical Impression Statement  Continued to manual therapy to right upper quadrant to help reduce tightness and scar tissue. Pt stated she could feel a difference after treatment today. She did not feel as tight. Added two new strengthening exercises for lats and rhomboids to her HEP.     Rehab Potential  Good    Clinical Impairments Affecting Rehab Potential  7 nodes removed  and radition  on the right     PT Frequency  2x / week    PT Duration  4 weeks  PT Treatment/Interventions  ADLs/Self Care Home Management;Therapeutic exercise;Therapeutic activities;Patient/family education;Manual techniques;Manual lymph drainage;Scar mobilization;Passive range of motion;Taping    PT Next Visit Plan  manual techniques for stretching on right side, include soft tissue work to Rt upper back/scapula area; lat strengthening exercises, review supine scap, scar massage, intruct in core strengthening exdercises     Consulted and Agree with Plan of Care  Patient       Patient will benefit from skilled therapeutic intervention in order to improve the following deficits and impairments:  Postural dysfunction, Decreased knowledge of precautions, Pain, Impaired UE functional use, Decreased range of motion, Increased muscle spasms, Decreased scar mobility  Visit Diagnosis: Pain in right arm  Other muscle spasm  Muscle weakness (generalized)  Abnormal posture  Stiffness of right shoulder, not elsewhere classified     Problem List Patient Active Problem List   Diagnosis Date Noted  . Family history of ovarian cancer   . Family history of colon cancer   . Acquired absence of breast and nipple 01/29/2017  . Breast cancer, right ([place]) 05/19/2016  . Malignant neoplasm of overlapping sites of right breast in female, estrogen receptor positive ([place]) 03/31/2016  . 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 Hydrosalpinx 10/25/2012  . Sciatica of right side 03/31/2012  . Routine general medical examination at a health care facility 08/27/2010  . SPONTANEOUS ECCHYMOSES 01/03/2009  . ANXIETY DISORDER 07/04/2007  . HYPOTHYROIDISM 06/23/2007  . HEMORRHOIDS 06/23/2007  . ESOPHAGEAL STRICTURE 06/23/2007  . GERD 06/23/2007  . DEGENERATIVE JOINT DISEASE 06/23/2007  . ARTHRITIS 06/23/2007  . SLEEP APNEA 06/23/2007    [facility] 04/26/2017, 4:56 PM  [place], [place], [address] Phone: [phone]   Fax:  [phone]  Name: [name] MRN: [id number] Date of Birth: 1961-02-08  Manus Gunning, PT 04/26/17 4:56 PM

## 2017-04-28 ENCOUNTER — Ambulatory Visit: Payer: BLUE CROSS/BLUE SHIELD

## 2017-04-28 DIAGNOSIS — M79601 Pain in right arm: Secondary | ICD-10-CM

## 2017-04-28 DIAGNOSIS — M62838 Other muscle spasm: Secondary | ICD-10-CM

## 2017-04-28 DIAGNOSIS — R293 Abnormal posture: Secondary | ICD-10-CM

## 2017-04-28 DIAGNOSIS — M25611 Stiffness of right shoulder, not elsewhere classified: Secondary | ICD-10-CM | POA: Diagnosis not present

## 2017-04-28 DIAGNOSIS — M6281 Muscle weakness (generalized): Secondary | ICD-10-CM | POA: Diagnosis not present

## 2017-04-28 NOTE — Patient Instructions (Addendum)
PELVIC TILT: Posterior    Tighten abdominals, flatten low back. _10__ reps per set, hold __5__ seconds.  _1-2__ sets per day.  1. Hold pelvic tilt then open/close knees like a clam 2. Hold pelvic tilt then alternate marching 3. Hold pelvic tilt then alternate heel slide  Try each up to 10 times each leg, working up to 2-3 sets of 10.  Bridging    Slowly raise buttocks from floor, keeping stomach tight. Repeat __10__ times per set. Do __1-2__ sets per session. Do __1-2__ sessions per day.   Cancer Rehab [phone]   Also remember to add doorway stretching with skin stretch using Lt hand over incision and tight muscle tissue. Gently and only to tolerance! Try working this in multiple times/day.

## 2017-04-28 NOTE — Therapy (Signed)
[place], [place], [address] Phone: [phone]   Fax:  [phone]  Physical Therapy Treatment  Patient Details  Name: [name] MRN: [id number] Date of Birth: 11/04/1960 Referring Provider: Dr. Iran Planas    Encounter Date: 04/28/2017  PT End of Session - 04/28/17 1711    Visit Number  5    Number of Visits  9    Date for PT Re-Evaluation  05/07/17    PT Start Time  9767    PT Stop Time  1653    PT Time Calculation (min)  43 min    Activity Tolerance  Patient tolerated treatment well    Behavior During Therapy  [facility] for tasks assessed/performed       Past Medical History:  Diagnosis Date  . Anxiety   . Anxiety disorder   . Asthma    h/o asthma as a child  . Breast cancer ([place])   . Cancer ([place]) 04/2016   right breast cancer  . Cholelithiasis   . Chronic kidney disease    obstruction of R kidney, ( not a stone) - currently resolved   . Depression   . Diverticulosis   . DJD (degenerative joint disease)    hands & back   . Dyspnea    resolved since she stopped smoking   . Epigastric abdominal pain   . Esophageal stricture   . Family history of adverse reaction to anesthesia    daughter has N&V, takes long time to wake up   . Family history of colon cancer   . Family history of ovarian cancer   . Family hx of colon cancer   . 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 Ganglion cyst   . GERD (gastroesophageal reflux disease)   . Headache    low grade currently , family history of migraines   . Hemorrhoid   . History of radiation therapy 05/03 - 08/11/2016   1. 4 field Right breast was treated to 50.4 Gy in 25 fractions at 1.8 Gy per fraction. 2. The Right breast was boosted to 10 Gy in 5 fractions at 2 Gy per fraction.  . Hypothyroidism   . Plantar fasciitis, bilateral   . PONV (postoperative nausea and vomiting)    gets anxious with the mask on her face, also remarks that  the scop. patch has helped in the past       Past Surgical History:  Procedure Laterality Date  . ABDOMINAL HYSTERECTOMY    . BREAST RECONSTRUCTION WITH PLACEMENT OF TISSUE EXPANDER AND FLEX HD (ACELLULAR HYDRATED DERMIS) Bilateral 05/19/2016   Procedure: BREAST RECONSTRUCTION WITH PLACEMENT OF TISSUE EXPANDER AND ALLODERM PLACEMENT;  Surgeon: Irene Limbo, MD;  Location: [facility];  Service: Plastics;  Laterality: Bilateral;  . CESAREAN SECTION  1988  . CHOLECYSTECTOMY    . HEMORRHOID SURGERY    . LAPAROSCOPIC BILATERAL SALPINGECTOMY N/A 10/26/2012   Procedure: operative laparoscopy with lysis of adhesions;  Surgeon: Thornell Sartorius, MD;  Location: [facility];  Service: Gynecology;  Laterality: N/A;  . MASTECTOMY Bilateral   . MASTECTOMY WITH RADIOACTIVE SEED GUIDED EXCISION AND AXILLARY SENTINEL LYMPH NODE BIOPSY Bilateral 05/19/2016   Procedure: RIGHT SKIN SPARING MASTECTOMY WITH RIGHT RADIOACTIVE SEED TARGETED DISSECTION AND RIGHT SENTINEL LYMPH NODE BIOPSY, LEFT PROPHYLACTIC SKIN SPARING MASTECTOMY;  Surgeon: Rolm Bookbinder, MD;  Location: [facility];  Service: General;  Laterality: Bilateral;  . REMOVAL OF BILATERAL TISSUE EXPANDERS WITH PLACEMENT OF BILATERAL  BREAST IMPLANTS Bilateral 01/29/2017   Procedure: REMOVAL OF BILATERAL TISSUE EXPANDERS WITH PLACEMENT OF BILATERAL SILICONE BREAST IMPLANTS, ALLODERM TO LEFT BREAST RECONSTRUCTION;  RIGHT LATISSUMUS FLAP;  Surgeon: Irene Limbo, MD;  Location: [facility];  Service: Plastics;  Laterality: Bilateral;  Requesting RNFA  . TONSILLECTOMY    . TUBAL LIGATION    . urologic surgery for ureteropelvic junction obstruction      There were no vitals filed for this visit.  Subjective Assessment - 04/28/17 1613    Subjective  I was sore after last session but each time I can tell it's getting a little better overall but my rib pain is about the same.     Pertinent History  breast cancer . Dad and sister have a rare  blood disorder that pt is being evaluated    Patient Stated Goals  decrease the pain on the right side and get looser on the right side     Currently in Pain?  Yes    Pain Score  6     Pain Location  Rib cage    Pain Orientation  Right    Pain Descriptors / Indicators  Tightness    Pain Type  Chronic pain    Pain Radiating Towards  axilla    Pain Onset  More than a month ago    Pain Frequency  Intermittent    Aggravating Factors   just always feels tight    Pain Relieving Factors  pain meds and the stretching does help temporarily                      [facility] Adult PT Treatment/Exercise - 04/28/17 0001      Lumbar Exercises: Supine   Pelvic Tilt  10 reps;5 seconds    Clam  10 reps 2 sets of 5 with pelvic tilts    Heel Slides  5 reps with pelvic tilt    Bent Knee Raise  10 reps with pelvic tilt, 2 sets of 5    Bridge  10 reps;3 seconds      Manual Therapy   Manual Therapy  Myofascial release;Passive ROM    Myofascial Release  gentle scar massage to area of tight scar tissue in inferior right breast along incision; myofascial release to Rt lateral flank area from axilla to lower ribs; also to tightness in axilla; instructed pt in how to perform this at home with using doorway for support for Rt UE in a stretched position (into flexion or abduction) and then gentle strech with Lt hand    Passive ROM  To Rt shoulder in supine into flexion, abduction and D2 for optimal myofascial release             PT Education - 04/28/17 1708    Education provided  Yes    Education Details  Lumbar stabilizations and myofascial release    Person(s) Educated  Patient    Methods  Explanation;Demonstration;Handout    Comprehension  Verbalized understanding;Returned demonstration          PT Long Term Goals - 04/06/17 1739      PT LONG TERM GOAL #1   Title  Pt will report her pain  and tightness in her right upper quadrant is decreased by 50% so that she can perfrom her  activites of daily living easier     Time  4    Period  Weeks    Status  New      PT LONG  TERM GOAL #2   Title  Pt will be independent in a home exercise program for core strengthening and stabiltiy sothat she can turn her body to look around while she is driving without pain     Time  4    Period  Weeks    Status  New            Plan - 04/28/17 1711    Clinical Impression Statement  Continued with focus on manual therapy to Rt upper quadrant to further reduce tightness and scar tissue. Pt reports noticing loosening of tissue and decreased tightness at Rt flank area after session, but tightness always comes back. So today showed her how to perform self myofascial release (see flowsheet) and pt verbalized understanding of this. Also progressed her HEP to include core stabilization which she tolerated well.     Rehab Potential  Good    Clinical Impairments Affecting Rehab Potential  7 nodes removed and radition  on the right     PT Frequency  2x / week    PT Duration  4 weeks    PT Treatment/Interventions  ADLs/Self Care Home Management;Therapeutic exercise;Therapeutic activities;Patient/family education;Manual techniques;Manual lymph drainage;Scar mobilization;Passive range of motion;Taping    PT Next Visit Plan  manual techniques for stretching on right side, include soft tissue work to Rt upper back/scapula area; lat strengthening exercises, review supine scap, scar massage, review core strengthening exercises     Consulted and Agree with Plan of Care  Patient       Patient will benefit from skilled therapeutic intervention in order to improve the following deficits and impairments:  Postural dysfunction, Decreased knowledge of precautions, Pain, Impaired UE functional use, Decreased range of motion, Increased muscle spasms, Decreased scar mobility  Visit Diagnosis: Pain in right arm  Other muscle spasm  Muscle weakness (generalized)  Abnormal posture  Stiffness of right  shoulder, not elsewhere classified     Problem List Patient Active Problem List   Diagnosis Date Noted  . Family history of ovarian cancer   . Family history of colon cancer   . Acquired absence of breast and nipple 01/29/2017  . Breast cancer, right ([place]) 05/19/2016  . Malignant neoplasm of overlapping sites of right breast in female, estrogen receptor positive ([place]) 03/31/2016  . 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 Hydrosalpinx 10/25/2012  . Sciatica of right side 03/31/2012  . Routine general medical examination at a health care facility 08/27/2010  . SPONTANEOUS ECCHYMOSES 01/03/2009  . ANXIETY DISORDER 07/04/2007  . HYPOTHYROIDISM 06/23/2007  . HEMORRHOIDS 06/23/2007  . ESOPHAGEAL STRICTURE 06/23/2007  . GERD 06/23/2007  . DEGENERATIVE JOINT DISEASE 06/23/2007  . ARTHRITIS 06/23/2007  . SLEEP APNEA 06/23/2007    Otelia Limes, PTA 04/28/2017, 5:18 PM  [place], [place], [address] Phone: [phone]   Fax:  [phone]  Name: [name] MRN: [id number] Date of Birth: 05-Jul-1960

## 2017-04-29 ENCOUNTER — Encounter: Payer: Self-pay | Admitting: Genetics

## 2017-04-29 ENCOUNTER — Ambulatory Visit: Payer: Self-pay | Admitting: Genetics

## 2017-04-29 DIAGNOSIS — Z8041 Family history of malignant neoplasm of ovary: Secondary | ICD-10-CM

## 2017-04-29 DIAGNOSIS — Z170 Estrogen receptor positive status [ER+]: Secondary | ICD-10-CM

## 2017-04-29 DIAGNOSIS — Z1379 Encounter for other screening for genetic and chromosomal anomalies: Secondary | ICD-10-CM | POA: Insufficient documentation

## 2017-04-29 DIAGNOSIS — Z800 Family history of malignant neoplasm of digestive organs: Secondary | ICD-10-CM

## 2017-04-29 DIAGNOSIS — C50811 Malignant neoplasm of overlapping sites of right female breast: Secondary | ICD-10-CM

## 2017-04-29 NOTE — Progress Notes (Addendum)
HPI: [name] was previously seen in the [place] clinic on 03/29/2017 due to a personal and family history of cancer and concerns regarding a hereditary predisposition to cancer. Please refer to our prior cancer genetics clinic note for more information regarding [name]'s medical, social and family histories, and our assessment and recommendations, at the time. [name] recent genetic test results were disclosed to her, as well as recommendations warranted by these results. These results and recommendations are discussed in more detail below.  CANCER HISTORY:  Oncology History   Cancer Staging Malignant neoplasm of overlapping sites of right breast in female, estrogen receptor positive ([place]) Staging form: Breast, AJCC 8th Edition - Clinical stage from 03/23/2016: Stage IIA (cT3, cN1, cM0, G2, ER: Positive, PR: Positive, HER2: Negative) - Signed by Truitt Merle, MD on 03/31/2016 - Pathologic stage from 05/19/2016: Stage IA (pT2(m), pN1a, cM0, G1, ER: Positive, PR: Positive, HER2: Negative) - Signed by Truitt Merle, MD on 06/18/2016       Malignant neoplasm of overlapping sites of right breast in female, estrogen receptor positive ([place])   03/17/2016 Mammogram    B/l diagnostic mammogram and righ US showed a 3.6cm irregular mass in the right breast 11:00 position, posterior depth, there Hogan a enlarged lymph node in the right axilla Hogan highly suspicious for malignancy. additional 7 mm oval mass in the right breast lower outer quadrant Hogan suspicious for malignancy.      03/23/2016 Initial Biopsy    Right breast 9:30 position biopsy showed invasive ductal carcinoma, grade 1. 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      03/23/2016 Receptors her2    Both breast and node biopsy tumor ER 100% positive, PR 70-95% positive, HER-2 negative, Ki-67 40%      03/23/2016 Initial Diagnosis    Malignant neoplasm of upper-outer quadrant of right breast in female, estrogen  receptor positive ([place])      03/25/2016 Initial Biopsy    Right breast 11:00 position core needle biopsy showed invasive duct carcinoma, grade 2.       03/25/2016 Receptors her2    ER 95% positive, PR 90% positive, HER-2 negative, Ki-67 15%      03/25/2016 Miscellaneous    Mammaprint showed low risk type A with index +0.105      03/30/2016 Imaging    Bilateral breast MRI with and without contrast showed a large lobulated enhancing mass within the upper-outer and lower outer right breast with surrounding nodularity, measuring 6.1 x 4.4 x 5.6 cm. Multiple critically sick and right axillary lymph nodes are demonstrated measuring up to 1.5 cm.       04/01/2016 -  Anti-estrogen oral therapy    Exemestane 25 mg daily, plan for 7 years       05/19/2016 Surgery    Bilateral mastectomy and right axillary regional lymph node resection.      05/19/2016 Pathology Results    -Right axillary regional lymph node resection revealed metastatic carcinoma in 2/7 lymph nodes. -Left simple mastectomy revealed lobular neoplasia and fibrocystic changes with adenosis and calcifications. 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 -Skin of the right mastectomy flap was benign. -mpT2, pN1a      06/25/2016 - 08/11/2016 Radiation Therapy    Site/dose:    1. 4 field Right breast was treated to 50.4 Gy in  25 fractions at 1.8 Gy per fraction. 2. The Right breast was boosted to 10 Gy in 5 fractions at 2 Gy per fraction.        FAMILY HISTORY:  We obtained a detailed, 4-generation family history.  Significant diagnoses are listed below: Family History  Problem Relation Age of Onset  . COPD Mother   . Ovarian cancer Mother 63       surgery, no chemo  . Kidney disease Mother   . Diabetes Father   . Heart  disease Father   . Hypertension Father   . Colon cancer Father 83  . Other Father        Amyloidosis  . Cancer Father        2nd cancer later in life, type unk  . Hypertension Sister   . Depression Sister   . Arthritis Sister   . Von Willebrand disease Sister   . Breast cancer Paternal Grandmother        pt doesn't remember, but her sister says this grandmother had br cancer  . Goiter Paternal Grandmother   . Penile cancer Paternal Uncle 48  . Rheum arthritis Maternal Grandmother    [name] has a 38 year old daughter who has had a tubal pregnancy and bone spurs on her back but no history of cancer.  This daughter has a 42 year-old daughter.  [name] also has a 51 year-old daughter who she reports has 'rolie veins' and no history of cancer.  This daughter has a daughter and 2 sons. [name] has a 46 year-old sister who has Von Willebrand Disease and no history of cancer.   [name] father was diagnosed with colon cancer in his late 65's.  He had Amyloidosis and had another cancer later in his life, but the type Hogan unknown.  He also had heart failure.  [name] has 3 paternal uncles.  One paternal uncle died of penile cancer in his 46's.  This uncle had a son and a daughter with no known history of cancer.  [name] other 2 uncles have no history of cancer.  None of her paternal cousins have any history of cancer.  [name] paternal grandfather died in his 6's with no history of cancer. Her paternal grandmother had breast cancer (patient does not remember her having this cancer, but reports here sister says she had breast cancer).  This grandmother also had a thyroid goiter.   [name] mother had ovarian cancer in her 52's.  She had surgery, but [name] does not remember her having chemotherapy.  When we asked if this might have been uterine cancer, the patient was unsure.  Her mother also had kidney disease and Hogan now in her 60's.  [name] mother was adopted so very  little Hogan known about her family history .  [name] only knows that her maternal grandmother died at 55 from crippling rheumatoid arthritis.   [name] unaware of previous family history of genetic testing for hereditary cancer risks. Patient's maternal ancestors are of Italian/Norther European descent, and paternal ancestors are of Northern European/Native American descent. There Hogan no reported Ashkenazi Jewish ancestry. There Hogan no known consanguinity.  GENETIC TEST RESULTS: Genetic testing performed through [facility]'s Common Hereditary Cancers Panel reported out on 04/15/2017 showed no pathogenic mutations. The Common Hereditary Cancer Panel offered by [facility] includes sequencing and/or deletion duplication testing of the following 47 genes: APC, ATM, AXIN2, BARD1, BMPR1A, BRCA1, BRCA2, BRIP1, CDH1, CDKN2A (  p14ARF), CDKN2A (p16INK4a), CKD4, CHEK2, CTNNA1, DICER1, EPCAM (Deletion/duplication testing only), GREM1 (promoter region deletion/duplication testing only), KIT, MEN1, MLH1, MSH2, MSH3, MSH6, MUTYH, NBN, NF1, NHTL1, PALB2, PDGFRA, PMS2, POLD1, POLE, PTEN, RAD50, RAD51C, RAD51D, SDHB, SDHC, SDHD, SMAD4, SMARCA4. STK11, TP53, TSC1, TSC2, and VHL.  The following genes were evaluated for sequence changes only: SDHA and HOXB13 c.251G>A variant only..  The test report will be scanned into EPIC and will be located under the Molecular Pathology section of the Results Review tab.A portion of the result report Hogan included below for reference.     We discussed with Ms. Veracruz that because current genetic testing Hogan not perfect, it Hogan possible there may be a gene mutation in one of these genes that current testing cannot detect, but that chance Hogan small. We also discussed, that there could be another gene that has not yet been discovered, or that we have not yet tested, that Hogan responsible for the cancer diagnoses in the family. It Hogan also possible there Hogan a hereditary cause for the cancer in the family  that Ms. Lavine did not inherit and therefore was not identified in her testing.  Therefore, it Hogan important to remain in touch with cancer genetics in the future so that we can continue to offer Ms. Cantrell the most up to date genetic testing.   Ms. Gasser had questions about getting tested for VonWille Brand disease as her sister has this and she had problems with increased bleeding after her breast surgery.  I informed her there were orders in her chart indicating Dr. Burr Medico was ordering some labs to assess this.  I recommended she check in with Dr. Burr Medico about this testing.  Ms. Houchen also asked if there was any blood test/other testing to assess for Amyloidosis, which her father had. I recommended she discuss this issue with her doctors to determine if any further testing Hogan warranted.  She requested I send Dr. Burr Medico a message regarding this issue and a message was sent alerting Dr. Burr Medico about the patient's question/request.    ADDITIONAL GENETIC TESTING: We discussed with Ms. Leccese that there are other genes that are associated with increased cancer risk that can be analyzed. The laboratories that offer this testing look at these additional genes via a hereditary cancer gene panel. Should Ms. Tiano wish to pursue additional genetic testing, we are happy to discuss and coordinate this testing, at any time.    CANCER SCREENING RECOMMENDATIONS: This result indicates that it Hogan unlikely Ms. Frangos has an increased risk for a future cancer due to a mutation in one of these genes. This normal test also suggests that Ms. Frechette's cancer was most likely not due to an inherited predisposition associated with one of these genes.  Most cancers happen by chance and this negative test suggests that her cancer may fall into this category.  However, genetic testing Hogan not perfect, and It Hogan still possible that there could be genetic mutations that are undetectable by current technology, or genetic mutations in genes that have  not been tested or identified to increase cancer risk.  Therefore, it Hogan recommended she continue to follow the cancer management and screening guidelines provided by her oncology and primary healthcare provider. Other factors such as her personal and family history may still affect her cancer risk.  We recommended she continue to have colonoscopies every 5 years or as directed by her GI physicians.   RECOMMENDATIONS FOR FAMILY MEMBERS: Women in this family might  be at some increased risk of developing cancer, over the general population risk, simply due to the family history of cancer. We recommended women in this family have a yearly mammogram beginning at age 56, or 35 years younger than the earliest onset of cancer, an annual clinical breast exam, and perform monthly breast self-exams. Women in this family should also have a gynecological exam as recommended by their primary provider. All family members should have a colonoscopy by age 47.  All family members should inform their physicians about the family history of cancer so their doctors can make the most appropriate screening recommendations for them.   Based on Ms. Sedor's family history, we recommended maternal and paternal relatives, have genetic counseling and testing. Ms. Kram will let us know if we can be of any assistance in coordinating genetic counseling and/or testing for these family members.   FOLLOW-UP: Lastly, we discussed with Ms. Crampton that cancer genetics Hogan a rapidly advancing field and it Hogan possible that new genetic tests will be appropriate for her and/or her family members in the future. We encouraged her to remain in contact with cancer genetics on an annual basis so we can update her personal and family histories and let her know of advances in cancer genetics that may benefit this family.   Our contact number was provided. Ms. Filippone questions were answered to her satisfaction, and she knows she Hogan welcome to call us at  anytime with additional questions or concerns.   Ferol Luz, MS, [facility] Certified Genetic Counselor [email or web address]

## 2017-05-03 DIAGNOSIS — Z853 Personal history of malignant neoplasm of breast: Secondary | ICD-10-CM | POA: Diagnosis not present

## 2017-05-03 DIAGNOSIS — Z923 Personal history of irradiation: Secondary | ICD-10-CM | POA: Diagnosis not present

## 2017-05-03 DIAGNOSIS — Z9013 Acquired absence of bilateral breasts and nipples: Secondary | ICD-10-CM | POA: Diagnosis not present

## 2017-05-05 ENCOUNTER — Ambulatory Visit: Payer: BLUE CROSS/BLUE SHIELD

## 2017-05-05 DIAGNOSIS — R293 Abnormal posture: Secondary | ICD-10-CM | POA: Diagnosis not present

## 2017-05-05 DIAGNOSIS — M25611 Stiffness of right shoulder, not elsewhere classified: Secondary | ICD-10-CM | POA: Diagnosis not present

## 2017-05-05 DIAGNOSIS — M79601 Pain in right arm: Secondary | ICD-10-CM | POA: Diagnosis not present

## 2017-05-05 DIAGNOSIS — M62838 Other muscle spasm: Secondary | ICD-10-CM

## 2017-05-05 DIAGNOSIS — M6281 Muscle weakness (generalized): Secondary | ICD-10-CM | POA: Diagnosis not present

## 2017-05-05 NOTE — Therapy (Signed)
[place], [place], [address] Phone: [phone]   Fax:  [phone]  Physical Therapy Treatment  Patient Details  Name: [name] MRN: [id number] Date of Birth: [birth year] Referring Provider: Dr. Iran Planas    Encounter Date: 05/05/2017  PT End of Session - 05/05/17 1716    Visit Number  6    Number of Visits  9    Date for PT Re-Evaluation  05/07/17    PT Start Time  1610    PT Stop Time  1651    PT Time Calculation (min)  41 min    Activity Tolerance  Patient tolerated treatment well    Behavior During Therapy  [facility] for tasks assessed/performed       Past Medical History:  Diagnosis Date  . Anxiety   . Anxiety disorder   . Asthma    h/o asthma as a child  . Breast cancer ([place])   . Cancer ([place]) 04/2016   right breast cancer  . Cholelithiasis   . Chronic kidney disease    obstruction of R kidney, ( not a stone) - currently resolved   . Depression   . Diverticulosis   . DJD (degenerative joint disease)    hands & back   . Dyspnea    resolved since she stopped smoking   . Epigastric abdominal pain   . Esophageal stricture   . Family history of adverse reaction to anesthesia    daughter has N&V, takes long time to wake up   . Family history of colon cancer   . Family history of ovarian cancer   . Family hx of colon cancer   . 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 Ganglion cyst   . GERD (gastroesophageal reflux disease)   . Headache    low grade currently , family history of migraines   . Hemorrhoid   . History of radiation therapy 05/03 - 08/11/2016   1. 4 field Right breast was treated to 50.4 Gy in 25 fractions at 1.8 Gy per fraction. 2. The Right breast was boosted to 10 Gy in 5 fractions at 2 Gy per fraction.  . Hypothyroidism   . Plantar fasciitis, bilateral   . PONV (postoperative nausea and vomiting)    gets anxious with the mask on her face, also remarks that  the scop. patch has helped in the past       Past Surgical History:  Procedure Laterality Date  . ABDOMINAL HYSTERECTOMY    . BREAST RECONSTRUCTION WITH PLACEMENT OF TISSUE EXPANDER AND FLEX HD (ACELLULAR HYDRATED DERMIS) Bilateral 05/19/2016   Procedure: BREAST RECONSTRUCTION WITH PLACEMENT OF TISSUE EXPANDER AND ALLODERM PLACEMENT;  Surgeon: Irene Limbo, MD;  Location: [place];  Service: Plastics;  Laterality: Bilateral;  . CESAREAN SECTION  1988  . CHOLECYSTECTOMY    . HEMORRHOID SURGERY    . LAPAROSCOPIC BILATERAL SALPINGECTOMY N/A 10/26/2012   Procedure: operative laparoscopy with lysis of adhesions;  Surgeon: Thornell Sartorius, MD;  Location: [facility];  Service: Gynecology;  Laterality: N/A;  . MASTECTOMY Bilateral   . MASTECTOMY WITH RADIOACTIVE SEED GUIDED EXCISION AND AXILLARY SENTINEL LYMPH NODE BIOPSY Bilateral 05/19/2016   Procedure: RIGHT SKIN SPARING MASTECTOMY WITH RIGHT RADIOACTIVE SEED TARGETED DISSECTION AND RIGHT SENTINEL LYMPH NODE BIOPSY, LEFT PROPHYLACTIC SKIN SPARING MASTECTOMY;  Surgeon: Rolm Bookbinder, MD;  Location: [facility];  Service: General;  Laterality: Bilateral;  . REMOVAL OF BILATERAL TISSUE EXPANDERS WITH PLACEMENT OF BILATERAL  BREAST IMPLANTS Bilateral 01/29/2017   Procedure: REMOVAL OF BILATERAL TISSUE EXPANDERS WITH PLACEMENT OF BILATERAL SILICONE BREAST IMPLANTS, ALLODERM TO LEFT BREAST RECONSTRUCTION;  RIGHT LATISSUMUS FLAP;  Surgeon: Irene Limbo, MD;  Location: [facility];  Service: Plastics;  Laterality: Bilateral;  Requesting RNFA  . TONSILLECTOMY    . TUBAL LIGATION    . urologic surgery for ureteropelvic junction obstruction      There were no vitals filed for this visit.  Subjective Assessment - 05/05/17 1613    Subjective  I feel really good when I leave here and the rib pain is better for 1 or 2 days but always comes back.     Pertinent History  breast cancer . Dad and sister have a rare blood disorder that pt is  being evaluated    Patient Stated Goals  decrease the pain on the right side and get looser on the right side     Currently in Pain?  Yes    Pain Score  6     Pain Location  Rib cage    Pain Orientation  Right    Pain Descriptors / Indicators  Aching    Pain Type  Chronic pain    Pain Onset  More than a month ago    Aggravating Factors   just always there    Pain Relieving Factors  Therapy helps for a bit an pain meds                      [facility] Adult PT Treatment/Exercise - 05/05/17 0001      Shoulder Exercises: Supine   Horizontal ABduction  AROM;Both supine over towel roll along T-spine    Other Supine Exercises  Scaption ("V") with bil UE's 5 times laying over along towel roll along T-spine      Manual Therapy   Manual Therapy  Myofascial release;Passive ROM    Myofascial Release  gentle scar massage to area of tight scar tissue in inferior right breast along incision; myofascial release to Rt lateral flank area from axilla to lower ribs; also to tightness in axilla; instructed pt in how to perform this at home with using doorway for support for Rt UE in a stretched position (into flexion or abduction) and then gentle strech with Lt hand    Passive ROM  To Rt shoulder in supine into flexion, abduction and D2 for optimal myofascial release                  PT Long Term Goals - 05/05/17 1617      PT LONG TERM GOAL #1   Title  Pt will report her pain  and tightness in her right upper quadrant is decreased by 50% so that she can perfrom her activites of daily living easier     Baseline  50% improvement with Rt axillary pain but no change with rib pain-05/05/17    Status  Partially Met      PT LONG TERM GOAL #2   Title  Pt will be independent in a home exercise program for core strengthening and stabiltiy sothat she can turn her body to look around while she is driving without pain     Status  Partially Met            Plan - 05/05/17 1717     Clinical Impression Statement  Overall pt has made good progress with goals except her Rt rib pain only temporarily improves after therapy (for  up to 2 days) but always returns so that is unchanged. She is independent in HEP for postural and core stength/stability and has bee nincorporating her HEP stretching into her day. Pt has 1 visit scheduled for next week and feels ready to D/C then.     Rehab Potential  Good    Clinical Impairments Affecting Rehab Potential  7 nodes removed and radition  on the right     PT Frequency  2x / week    PT Duration  4 weeks    PT Treatment/Interventions  ADLs/Self Care Home Management;Therapeutic exercise;Therapeutic activities;Patient/family education;Manual techniques;Manual lymph drainage;Scar mobilization;Passive range of motion;Taping    PT Next Visit Plan  D/C next visit. Assess goals, review HEP prn (progress prn) and cont manual therapy to Rt lateral trunk. Try quadruped strength (opposite UE/LE lifts) and stretching over foam roll in Lt S/L.    Consulted and Agree with Plan of Care  Patient       Patient will benefit from skilled therapeutic intervention in order to improve the following deficits and impairments:  Postural dysfunction, Decreased knowledge of precautions, Pain, Impaired UE functional use, Decreased range of motion, Increased muscle spasms, Decreased scar mobility  Visit Diagnosis: Pain in right arm  Other muscle spasm  Muscle weakness (generalized)  Abnormal posture  Stiffness of right shoulder, not elsewhere classified     Problem List Patient Active Problem List   Diagnosis Date Noted  . Genetic testing 04/29/2017  . Family history of ovarian cancer   . Family history of colon cancer   . Acquired absence of breast and nipple 01/29/2017  . Breast cancer, right ([place]) 05/19/2016  . Malignant neoplasm of overlapping sites of right breast in female, estrogen receptor positive ([place]) 03/31/2016  . Female pelvic peritoneal  adhesions 10/26/2012  . Hydrosalpinx 10/25/2012  . Sciatica of right side 03/31/2012  . Routine general medical examination at a health care facility 08/27/2010  . SPONTANEOUS ECCHYMOSES 01/03/2009  . ANXIETY DISORDER 07/04/2007  . HYPOTHYROIDISM 06/23/2007  . HEMORRHOIDS 06/23/2007  . ESOPHAGEAL STRICTURE 06/23/2007  . GERD 06/23/2007  . DEGENERATIVE JOINT DISEASE 06/23/2007  . ARTHRITIS 06/23/2007  . SLEEP APNEA 06/23/2007    Otelia Limes, PTA 05/05/2017, 5:21 PM  [place], [place], [address] Phone: [phone]   Fax:  [phone]  Name: [name] MRN: [id number] Date of Birth: 01-01-1961

## 2017-05-08 ENCOUNTER — Other Ambulatory Visit: Payer: Self-pay | Admitting: Hematology

## 2017-05-08 DIAGNOSIS — Z170 Estrogen receptor positive status [ER+]: Principal | ICD-10-CM

## 2017-05-08 DIAGNOSIS — C50811 Malignant neoplasm of overlapping sites of right female breast: Secondary | ICD-10-CM

## 2017-05-10 ENCOUNTER — Encounter: Payer: BLUE CROSS/BLUE SHIELD | Admitting: Physical Therapy

## 2017-05-12 ENCOUNTER — Ambulatory Visit: Payer: BLUE CROSS/BLUE SHIELD

## 2017-05-12 DIAGNOSIS — M6281 Muscle weakness (generalized): Secondary | ICD-10-CM | POA: Diagnosis not present

## 2017-05-12 DIAGNOSIS — M62838 Other muscle spasm: Secondary | ICD-10-CM

## 2017-05-12 DIAGNOSIS — M79601 Pain in right arm: Secondary | ICD-10-CM

## 2017-05-12 DIAGNOSIS — M25611 Stiffness of right shoulder, not elsewhere classified: Secondary | ICD-10-CM | POA: Diagnosis not present

## 2017-05-12 DIAGNOSIS — R293 Abnormal posture: Secondary | ICD-10-CM | POA: Diagnosis not present

## 2017-05-12 NOTE — Therapy (Addendum)
[place], [place], [address] Phone: [phone]   Fax:  [phone]  Physical Therapy Treatment  Patient Details  Name: MERIT GADSBY MRN: [id number] Date of Birth: [birth year] Referring Provider: Dr. Iran Planas    Encounter Date: 05/12/2017  PT End of Session - 05/12/17 1650    Visit Number  7    Number of Visits  9    Date for PT Re-Evaluation  05/07/17    PT Start Time  1606    PT Stop Time  1646    PT Time Calculation (min)  40 min    Activity Tolerance  Patient tolerated treatment well    Behavior During Therapy  [facility] for tasks assessed/performed       Past Medical History:  Diagnosis Date  . Anxiety   . Anxiety disorder   . Asthma    h/o asthma as a child  . Breast cancer ([place])   . Cancer ([place]) 04/2016   right breast cancer  . Cholelithiasis   . Chronic kidney disease    obstruction of R kidney, ( not a stone) - currently resolved   . Depression   . Diverticulosis   . DJD (degenerative joint disease)    hands & back   . Dyspnea    resolved since she stopped smoking   . Epigastric abdominal pain   . Esophageal stricture   . Family history of adverse reaction to anesthesia    daughter has N&V, takes long time to wake up   . Family history of colon cancer   . Family history of ovarian cancer   . Family hx of colon cancer   . 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 Ganglion cyst   . GERD (gastroesophageal reflux disease)   . Headache    low grade currently , family history of migraines   . Hemorrhoid   . History of radiation therapy 05/03 - 08/11/2016   1. 4 field Right breast was treated to 50.4 Gy in 25 fractions at 1.8 Gy per fraction. 2. The Right breast was boosted to 10 Gy in 5 fractions at 2 Gy per fraction.  . Hypothyroidism   . Plantar fasciitis, bilateral   . PONV (postoperative nausea and vomiting)    gets anxious with the mask on her face, also remarks that  the scop. patch has helped in the past       Past Surgical History:  Procedure Laterality Date  . ABDOMINAL HYSTERECTOMY    . BREAST RECONSTRUCTION WITH PLACEMENT OF TISSUE EXPANDER AND FLEX HD (ACELLULAR HYDRATED DERMIS) Bilateral 05/19/2016   Procedure: BREAST RECONSTRUCTION WITH PLACEMENT OF TISSUE EXPANDER AND ALLODERM PLACEMENT;  Surgeon: Irene Limbo, MD;  Location: [place];  Service: Plastics;  Laterality: Bilateral;  . CESAREAN SECTION  1988  . CHOLECYSTECTOMY    . HEMORRHOID SURGERY    . LAPAROSCOPIC BILATERAL SALPINGECTOMY N/A 10/26/2012   Procedure: operative laparoscopy with lysis of adhesions;  Surgeon: Thornell Sartorius, MD;  Location: [facility];  Service: Gynecology;  Laterality: N/A;  . MASTECTOMY Bilateral   . MASTECTOMY WITH RADIOACTIVE SEED GUIDED EXCISION AND AXILLARY SENTINEL LYMPH NODE BIOPSY Bilateral 05/19/2016   Procedure: RIGHT SKIN SPARING MASTECTOMY WITH RIGHT RADIOACTIVE SEED TARGETED DISSECTION AND RIGHT SENTINEL LYMPH NODE BIOPSY, LEFT PROPHYLACTIC SKIN SPARING MASTECTOMY;  Surgeon: Rolm Bookbinder, MD;  Location: [place];  Service: General;  Laterality: Bilateral;  . REMOVAL OF BILATERAL TISSUE EXPANDERS WITH PLACEMENT OF BILATERAL  BREAST IMPLANTS Bilateral 01/29/2017   Procedure: REMOVAL OF BILATERAL TISSUE EXPANDERS WITH PLACEMENT OF BILATERAL SILICONE BREAST IMPLANTS, ALLODERM TO LEFT BREAST RECONSTRUCTION;  RIGHT LATISSUMUS FLAP;  Surgeon: Irene Limbo, MD;  Location: [facility];  Service: Plastics;  Laterality: Bilateral;  Requesting RNFA  . TONSILLECTOMY    . TUBAL LIGATION    . urologic surgery for ureteropelvic junction obstruction      There were no vitals filed for this visit.  Subjective Assessment - 05/12/17 1608    Subjective  I've been stretching alot more during the day at work and then at home. I've seen marginal improvement with the rib pain over the past week and I really think it's from stretching more. Also I am  at noticing my Rt side hasn't been too tight over past week as well.     Pertinent History  breast cancer . Dad and sister have a rare blood disorder that pt is being evaluated    Patient Stated Goals  decrease the pain on the right side and get looser on the right side     Currently in Pain?  Yes    Pain Score  5     Pain Location  Breast    Pain Orientation  Right    Pain Descriptors / Indicators  Constant;Aching    Pain Type  Chronic pain    Pain Radiating Towards  nerve pain in arm    Pain Onset  More than a month ago    Pain Frequency  Intermittent    Aggravating Factors   comes and goes    Pain Relieving Factors  Therapy and stretching                      [facility] Adult PT Treatment/Exercise - 05/12/17 0001      Manual Therapy   Manual Therapy  Myofascial release;Passive ROM    Myofascial Release  gentle scar massage to area of tight scar tissue in inferior right breast along incision; myofascial release to Rt lateral flank area from axilla to lower ribs; also to tightness in axilla; instructed pt in how to perform this at home with using doorway for support for Rt UE in a stretched position (into flexion or abduction) and then gentle strech with Lt hand    Passive ROM  To Rt shoulder in supine into flexion, abduction and D2 for optimal myofascial release                  PT Long Term Goals - 05/12/17 1610      PT LONG TERM GOAL #1   Title  Pt will report her pain  and tightness in her right upper quadrant is decreased by 50% so that she can perfrom her activites of daily living easier     Baseline  50% improvement with Rt axillary pain but no change with rib pain-05/05/17; rib pain slightly improved today from last week but marginally-05/12/17    Status  Partially Met      PT LONG TERM GOAL #2   Title  Pt will be independent in a home exercise program for core strengthening and stabiltiy sothat she can turn her body to look around while she is driving  without pain     Status  Achieved            Plan - 05/12/17 1651    Clinical Impression Statement  Pt reports being more consistent with stretching during her workday and  notes that her rib pain is marginally improved over past week due to this. Overall she is pleased with current status and has met or partially met all goals and ready for D/C at this time.     Rehab Potential  Good    Clinical Impairments Affecting Rehab Potential  7 nodes removed and radition  on the right     PT Frequency  2x / week    PT Duration  4 weeks    PT Treatment/Interventions  ADLs/Self Care Home Management;Therapeutic exercise;Therapeutic activities;Patient/family education;Manual techniques;Manual lymph drainage;Scar mobilization;Passive range of motion;Taping    PT Next Visit Plan  D/C this visit.     Consulted and Agree with Plan of Care  Patient       Patient will benefit from skilled therapeutic intervention in order to improve the following deficits and impairments:  Postural dysfunction, Decreased knowledge of precautions, Pain, Impaired UE functional use, Decreased range of motion, Increased muscle spasms, Decreased scar mobility  Visit Diagnosis: Pain in right arm  Other muscle spasm  Abnormal posture  Stiffness of right shoulder, not elsewhere classified     Problem List Patient Active Problem List   Diagnosis Date Noted  . Genetic testing 04/29/2017  . Family history of ovarian cancer   . Family history of colon cancer   . Acquired absence of breast and nipple 01/29/2017  . Breast cancer, right ([place]) 05/19/2016  . Malignant neoplasm of overlapping sites of right breast in female, estrogen receptor positive ([place]) 03/31/2016  . 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 Hydrosalpinx 10/25/2012  . Sciatica of right side 03/31/2012  . Routine general medical examination at a health care facility 08/27/2010  . SPONTANEOUS ECCHYMOSES 01/03/2009  . ANXIETY DISORDER 07/04/2007   . HYPOTHYROIDISM 06/23/2007  . HEMORRHOIDS 06/23/2007  . ESOPHAGEAL STRICTURE 06/23/2007  . GERD 06/23/2007  . DEGENERATIVE JOINT DISEASE 06/23/2007  . ARTHRITIS 06/23/2007  . SLEEP APNEA 06/23/2007    Otelia Limes, PTA 05/12/2017, 4:56 PM  [place], [place], [address] Phone: [phone]   Fax:  [phone]  Name: SAI MOURA MRN: [id number] Date of Birth: 1961-02-17  PHYSICAL THERAPY DISCHARGE SUMMARY  Visits from Start of Care: 7  Current functional level related to goals / functional outcomes: Goals partially met as noted above.   Remaining deficits: Still with some discomfort.   Education / Equipment: Home exercise program.  Plan: Patient agrees to discharge.  Patient goals were partially met. Patient is being discharged due to being pleased with the current functional level.  ?????    Serafina Royals, PT 05/12/17 9:23 PM

## 2017-05-14 ENCOUNTER — Other Ambulatory Visit: Payer: Self-pay | Admitting: *Deleted

## 2017-05-14 DIAGNOSIS — Z170 Estrogen receptor positive status [ER+]: Principal | ICD-10-CM

## 2017-05-14 DIAGNOSIS — C50811 Malignant neoplasm of overlapping sites of right female breast: Secondary | ICD-10-CM

## 2017-05-14 MED ORDER — EXEMESTANE 25 MG PO TABS: 25.0000 mg | ORAL_TABLET | Freq: Every day | ORAL | 0 refills | Status: DC

## 2017-05-25 DIAGNOSIS — D509 Iron deficiency anemia, unspecified: Secondary | ICD-10-CM | POA: Diagnosis not present

## 2017-05-25 DIAGNOSIS — Z0189 Encounter for other specified special examinations: Secondary | ICD-10-CM | POA: Diagnosis not present

## 2017-05-25 DIAGNOSIS — E039 Hypothyroidism, unspecified: Secondary | ICD-10-CM | POA: Diagnosis not present

## 2017-05-25 DIAGNOSIS — R040 Epistaxis: Secondary | ICD-10-CM | POA: Diagnosis not present

## 2017-05-25 DIAGNOSIS — N390 Urinary tract infection, site not specified: Secondary | ICD-10-CM | POA: Diagnosis not present

## 2017-06-01 DIAGNOSIS — R040 Epistaxis: Secondary | ICD-10-CM | POA: Diagnosis not present

## 2017-06-01 DIAGNOSIS — J3489 Other specified disorders of nose and nasal sinuses: Secondary | ICD-10-CM | POA: Diagnosis not present

## 2017-06-01 DIAGNOSIS — Z853 Personal history of malignant neoplasm of breast: Secondary | ICD-10-CM | POA: Diagnosis not present

## 2017-06-01 DIAGNOSIS — R718 Other abnormality of red blood cells: Secondary | ICD-10-CM | POA: Diagnosis not present

## 2017-06-01 DIAGNOSIS — E041 Nontoxic single thyroid nodule: Secondary | ICD-10-CM | POA: Diagnosis not present

## 2017-06-01 DIAGNOSIS — Z01419 Encounter for gynecological examination (general) (routine) without abnormal findings: Secondary | ICD-10-CM | POA: Diagnosis not present

## 2017-06-01 DIAGNOSIS — Z1212 Encounter for screening for malignant neoplasm of rectum: Secondary | ICD-10-CM | POA: Diagnosis not present

## 2017-06-01 DIAGNOSIS — Z0000 Encounter for general adult medical examination without abnormal findings: Secondary | ICD-10-CM | POA: Diagnosis not present

## 2017-06-02 ENCOUNTER — Other Ambulatory Visit: Payer: Self-pay | Admitting: Hematology

## 2017-06-07 ENCOUNTER — Telehealth: Payer: Self-pay | Admitting: *Deleted

## 2017-06-07 NOTE — Telephone Encounter (Signed)
"  [name] with [employer] calling in reference to this member's BRCA 1 and BRCA 2 genetic tests.  We need information for Medial necessity."

## 2017-06-10 ENCOUNTER — Encounter (HOSPITAL_BASED_OUTPATIENT_CLINIC_OR_DEPARTMENT_OTHER): Payer: Self-pay | Admitting: *Deleted

## 2017-06-10 ENCOUNTER — Other Ambulatory Visit: Payer: Self-pay

## 2017-06-17 DIAGNOSIS — Z923 Personal history of irradiation: Secondary | ICD-10-CM | POA: Diagnosis not present

## 2017-06-17 DIAGNOSIS — Z9013 Acquired absence of bilateral breasts and nipples: Secondary | ICD-10-CM | POA: Diagnosis not present

## 2017-06-17 DIAGNOSIS — Z853 Personal history of malignant neoplasm of breast: Secondary | ICD-10-CM | POA: Diagnosis not present

## 2017-06-18 ENCOUNTER — Telehealth: Payer: Self-pay | Admitting: *Deleted

## 2017-06-18 NOTE — Telephone Encounter (Signed)
"  [facility] Case Manager, Rickey Primus [phone] ext: [phone]) calling in reference to a recent lab not authorized for this patient.  Please return call, okay to leave message on my confidental voicemail."

## 2017-06-18 NOTE — Telephone Encounter (Signed)
Faxed ROI to [employer], attn: Renette Butters; [phone]

## 2017-06-21 ENCOUNTER — Inpatient Hospital Stay: Payer: BLUE CROSS/BLUE SHIELD | Attending: Genetic Counselor | Admitting: Hematology

## 2017-06-21 ENCOUNTER — Encounter: Payer: Self-pay | Admitting: Hematology

## 2017-06-21 ENCOUNTER — Telehealth: Payer: Self-pay | Admitting: Hematology

## 2017-06-21 ENCOUNTER — Inpatient Hospital Stay: Payer: BLUE CROSS/BLUE SHIELD

## 2017-06-21 VITALS — BP 138/83 | HR 88 | Temp 99.0°F | Resp 18 | Ht 65.0 in | Wt 176.2 lb

## 2017-06-21 DIAGNOSIS — M549 Dorsalgia, unspecified: Secondary | ICD-10-CM

## 2017-06-21 DIAGNOSIS — Z800 Family history of malignant neoplasm of digestive organs: Secondary | ICD-10-CM | POA: Diagnosis not present

## 2017-06-21 DIAGNOSIS — Z87891 Personal history of nicotine dependence: Secondary | ICD-10-CM | POA: Insufficient documentation

## 2017-06-21 DIAGNOSIS — Z170 Estrogen receptor positive status [ER+]: Principal | ICD-10-CM

## 2017-06-21 DIAGNOSIS — R0609 Other forms of dyspnea: Secondary | ICD-10-CM | POA: Diagnosis not present

## 2017-06-21 DIAGNOSIS — N189 Chronic kidney disease, unspecified: Secondary | ICD-10-CM

## 2017-06-21 DIAGNOSIS — Z79811 Long term (current) use of aromatase inhibitors: Secondary | ICD-10-CM | POA: Diagnosis not present

## 2017-06-21 DIAGNOSIS — Z8041 Family history of malignant neoplasm of ovary: Secondary | ICD-10-CM | POA: Diagnosis not present

## 2017-06-21 DIAGNOSIS — G4700 Insomnia, unspecified: Secondary | ICD-10-CM

## 2017-06-21 DIAGNOSIS — Z832 Family history of diseases of the blood and blood-forming organs and certain disorders involving the immune mechanism: Secondary | ICD-10-CM | POA: Insufficient documentation

## 2017-06-21 DIAGNOSIS — C773 Secondary and unspecified malignant neoplasm of axilla and upper limb lymph nodes: Secondary | ICD-10-CM | POA: Diagnosis not present

## 2017-06-21 DIAGNOSIS — Z79899 Other long term (current) drug therapy: Secondary | ICD-10-CM | POA: Diagnosis not present

## 2017-06-21 DIAGNOSIS — D649 Anemia, unspecified: Secondary | ICD-10-CM

## 2017-06-21 DIAGNOSIS — Z90710 Acquired absence of both cervix and uterus: Secondary | ICD-10-CM

## 2017-06-21 DIAGNOSIS — Z923 Personal history of irradiation: Secondary | ICD-10-CM | POA: Insufficient documentation

## 2017-06-21 DIAGNOSIS — K219 Gastro-esophageal reflux disease without esophagitis: Secondary | ICD-10-CM

## 2017-06-21 DIAGNOSIS — C50811 Malignant neoplasm of overlapping sites of right female breast: Secondary | ICD-10-CM

## 2017-06-21 DIAGNOSIS — M1990 Unspecified osteoarthritis, unspecified site: Secondary | ICD-10-CM | POA: Diagnosis not present

## 2017-06-21 DIAGNOSIS — N951 Menopausal and female climacteric states: Secondary | ICD-10-CM | POA: Diagnosis not present

## 2017-06-21 DIAGNOSIS — G8929 Other chronic pain: Secondary | ICD-10-CM | POA: Insufficient documentation

## 2017-06-21 DIAGNOSIS — D689 Coagulation defect, unspecified: Secondary | ICD-10-CM | POA: Insufficient documentation

## 2017-06-21 DIAGNOSIS — E039 Hypothyroidism, unspecified: Secondary | ICD-10-CM | POA: Insufficient documentation

## 2017-06-21 LAB — CBC WITH DIFFERENTIAL/PLATELET
Basophils Absolute: 0.1 10*3/uL (ref 0.0–0.1)
Basophils Relative: 1 %
Eosinophils Absolute: 0.2 10*3/uL (ref 0.0–0.5)
Eosinophils Relative: 4 %
HEMATOCRIT: 38.9 % (ref 34.8–46.6)
Hemoglobin: 11.9 g/dL (ref 11.6–15.9)
LYMPHS PCT: 36 %
Lymphs Abs: 2.1 10*3/uL (ref 0.9–3.3)
MCH: 24.4 pg — ABNORMAL LOW (ref 25.1–34.0)
MCHC: 30.6 g/dL — ABNORMAL LOW (ref 31.5–36.0)
MCV: 79.9 fL (ref 79.5–101.0)
Monocytes Absolute: 0.7 10*3/uL (ref 0.1–0.9)
Monocytes Relative: 11 %
NEUTROS ABS: 2.8 10*3/uL (ref 1.5–6.5)
Neutrophils Relative %: 48 %
Platelets: 254 10*3/uL (ref 145–400)
RBC: 4.87 MIL/uL (ref 3.70–5.45)
RDW: 23.1 % — ABNORMAL HIGH (ref 11.2–14.5)
WBC: 5.8 10*3/uL (ref 3.9–10.3)

## 2017-06-21 LAB — COMPREHENSIVE METABOLIC PANEL
ALT: 27 U/L (ref 0–55)
AST: 23 U/L (ref 5–34)
Albumin: 4.1 g/dL (ref 3.5–5.0)
Alkaline Phosphatase: 101 U/L (ref 40–150)
Anion gap: 10 (ref 3–11)
BILIRUBIN TOTAL: 0.3 mg/dL (ref 0.2–1.2)
BUN: 19 mg/dL (ref 7–26)
CO2: 25 mmol/L (ref 22–29)
CREATININE: 0.89 mg/dL (ref 0.60–1.10)
Calcium: 9.4 mg/dL (ref 8.4–10.4)
Chloride: 104 mmol/L (ref 98–109)
GFR calc Af Amer: >60 mL/min (ref >60)
Glucose, Bld: 99 mg/dL (ref 70–140)
Potassium: 3.8 mmol/L (ref 3.5–5.1)
Sodium: 139 mmol/L (ref 136–145)
Total Protein: 6.8 g/dL (ref 6.4–8.3)

## 2017-06-21 MED ORDER — ANASTROZOLE 1 MG PO TABS: 1.0000 mg | ORAL_TABLET | Freq: Every day | ORAL | 5 refills | Status: DC

## 2017-06-21 MED ORDER — VENLAFAXINE HCL ER 75 MG PO CP24: 75.0000 mg | ORAL_CAPSULE | Freq: Every day | ORAL | 5 refills | Status: DC

## 2017-06-21 MED ORDER — VENLAFAXINE HCL ER 150 MG PO CP24: 150.0000 mg | ORAL_CAPSULE | Freq: Every day | ORAL | 1 refills | Status: DC

## 2017-06-21 NOTE — Telephone Encounter (Signed)
Scheduled appt per 4/29 los - Gave pt AVS and calender per los.

## 2017-06-21 NOTE — Progress Notes (Signed)
[place]  [phone] [phone]  Clinic Follow Up Note   Patient Care Team: Adria Dill Leonia Reader, FNP as PCP - General (Internal Medicine) [place], Laurice Record, MD (Inactive) (Orthopedic Surgery) Rutherford Guys, MD (Ophthalmology) Lavonna Monarch, MD (Dermatology) Truitt Merle, MD as Consulting Physician (Hematology) Rolm Bookbinder, MD as Consulting Physician (General Surgery) Kyung Rudd, MD as Consulting Physician (Radiation Oncology) Delice Bison, Charlestine Massed, NP as Nurse Practitioner (Hematology and Oncology)   CHIEF COMPLAINTS: Right breast cancer  Oncology History   Cancer Staging Malignant neoplasm of overlapping sites of right breast in female, estrogen receptor positive [facility]) Staging form: Breast, AJCC 8th Edition - Clinical stage from 03/23/2016: Stage IIA (cT3, cN1, cM0, G2, ER: Positive, PR: Positive, HER2: Negative) - Signed by Truitt Merle, MD on 03/31/2016 - Pathologic stage from 05/19/2016: Stage IA (pT2(m), pN1a, cM0, G1, ER: Positive, PR: Positive, HER2: Negative) - Signed by Truitt Merle, MD on 06/18/2016       Malignant neoplasm of overlapping sites of right breast in female, estrogen receptor positive ([place])   03/17/2016 Mammogram    B/l diagnostic mammogram and righ US showed a 3.6cm irregular mass in the right breast 11:00 position, posterior depth, there is a enlarged lymph node in the right axilla is highly suspicious for malignancy. additional 7 mm oval mass in the right breast lower outer quadrant is suspicious for malignancy.      03/23/2016 Initial Biopsy    Right breast 9:30 position biopsy showed invasive ductal carcinoma, grade 1. 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      03/23/2016 Receptors her2    Both breast and node biopsy tumor ER 100% positive, PR 70-95% positive, HER-2 negative, Ki-67 40%      03/23/2016 Initial Diagnosis    Malignant neoplasm of upper-outer quadrant of right breast  in female, estrogen receptor positive ([place])      03/25/2016 Initial Biopsy    Right breast 11:00 position core needle biopsy showed invasive duct carcinoma, grade 2.       03/25/2016 Receptors her2    ER 95% positive, PR 90% positive, HER-2 negative, Ki-67 15%      03/25/2016 Miscellaneous    Mammaprint showed low risk type A with index +0.105      03/30/2016 Imaging    Bilateral breast MRI with and without contrast showed a large lobulated enhancing mass within the upper-outer and lower outer right breast with surrounding nodularity, measuring 6.1 x 4.4 x 5.6 cm. Multiple critically sick and right axillary lymph nodes are demonstrated measuring up to 1.5 cm.       04/01/2016 -  Anti-estrogen oral therapy    Exemestane 25 mg daily, plan for 7 years       05/19/2016 Surgery    Bilateral mastectomy and right axillary regional lymph node resection.      05/19/2016 Pathology Results    -Right axillary regional lymph node resection revealed metastatic carcinoma in 2/7 lymph nodes. -Left simple mastectomy revealed lobular neoplasia and fibrocystic changes with adenosis and calcifications. 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 -Skin of the right mastectomy flap was benign. -mpT2, pN1a      06/25/2016 - 08/11/2016 Radiation Therapy    Site/dose:    1. 4 field Right breast was treated to 50.4 Gy in 25 fractions at 1.8 Gy per fraction.  2. The Right breast was boosted to 10 Gy in 5 fractions at 2 Gy per fraction.      04/15/2017 Genetic Testing    The patient had genetic testing due to a personal history of breast cancer and family history of breast, ovarian, and colon cancer. The Common Hereditary Cancer Panel was ordered.  The Common Hereditary Cancer Panel offered by [facility]  includes sequencing and/or deletion duplication testing of the following 47 genes: APC, ATM, AXIN2, BARD1, BMPR1A, BRCA1, BRCA2, BRIP1, CDH1, CDKN2A (p14ARF), CDKN2A (p16INK4a), CKD4, CHEK2, CTNNA1, DICER1, EPCAM (Deletion/duplication testing only), GREM1 (promoter region deletion/duplication testing only), KIT, MEN1, MLH1, MSH2, MSH3, MSH6, MUTYH, NBN, NF1, NHTL1, PALB2, PDGFRA, PMS2, POLD1, POLE, PTEN, RAD50, RAD51C, RAD51D, SDHB, SDHC, SDHD, SMAD4, SMARCA4. STK11, TP53, TSC1, TSC2, and VHL.  The following genes were evaluated for sequence changes only: SDHA and HOXB13 c.251G>A variant only.  Results: Negative, no pathogenic variants identified. The date of this test report is 04/15/2017       HISTORY OF PRESENTING ILLNESS (04/01/2016):  [name] 57 y.o. female is here because of recent diagnosis of right breast carcinoma with metastatic adenopathy. She is accompanied by her husband and sister to our multidisciplinary breast clinic today.  She had right breast cystic lesion in the past and was aspirated/biopsied, a total of 3 times in the past, she has beeing doing annual mammogram but did not have it in 2017.   She felt a right breast lump one months ago, and was seen by her PCP. She also report sighicant pain at the breat mass site, persistent but fluctuates,  sometime10/10, no skin change or nipple discharge.  She has chronic back pain, she takes vocodin as needed, not very often. she also report anxiety and anorexia since the cancer diagnosis.   Biopsy of right breast at 9:30 o'clock position on 03/23/16 showed invasive mammary carcinoma. Biopsy of the right breast at the 11:00 o'clock position showed fibrocystic change and dense fibrosis with no malignancy identified. Right lymph node was positive for metastatic mammary carcinoma. Pathology revealed carcinoma appearing grade 1, ER/PR positive, HER-2 negative, Ki-67 40%. E-cadherin is positive, consistent with a ductal phenotype.  Repeat  biopsy of the right breast at the 11:00 o'clock position on 03/25/16 showed invasive mammary carcinoma. Pathology revealed carcinoma appearing grade 2. An E-Cadherin stain was performed revealing that some tumor cells (approximately 10%) are positive for E-Cadherin, supporting at least a focal ductal phenotype. The remainder of the tumor cells appear negative for E-Cadherin.  Bilateral breast MRI on 03/29/16 showed large irregular masslike area of enhancement within the upper outer and lower outer right breast compatible with biopsy-proven malignancy. Also seen were enlarged right axillary lymph nodes compatible with metastatic adenopathy.  She had hysterectomy due to a neuralgia in 2011, has had menopause symptoms (hot flashes and mood swing) for over a year now, overall tolerable. But she has been quite anxious since her cancer diagnosis, does not sleep well. Her appetite has also dropped since her cancer diagnosis.  GYN HISTORY  Menarchal: 9 LMP: 11/06/2009 (hysterectomy)  Contraceptive: yes, <5 years  HRT: non G2P2: two daughters age of 37 and 62, no breast feeding   Current Therapy: Exemestane 25 mg started on 04/01/16.  Interval History: [name] returns for follow up. She presents to the clinic today by herself. She reports she has mild anemia and her PCP put her on oral iron since beginning of April. She is scheduled for breast reconstruction with Dr. Iran Planas  via skin flap.   She is compliant with Exemestane and reports joint stiffness and pain in her hands in the morning. 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 She also has back pain that has mildly increased. She states her mouth has been dry as well. Her arthritis is not as bad as her other symptoms. She still has continued hot flashes.   She also has complaints of SOB on exertion and a mild dry cough. She does have a history of smoking and has never been diagnosed with COPD.   On review of systems,  pt denies chest pain, or any other complaints at this time. Pertinent positives are listed and detailed within the above HPI.   MEDICAL HISTORY:  Past Medical History:  Diagnosis Date  . Anxiety   . Anxiety disorder   . Asthma    h/o asthma as a child  . Breast cancer ([place])   . Cancer ([place]) 04/2016   right breast cancer  . Cholelithiasis   . Chronic kidney disease    obstruction of R kidney, ( not a stone) - currently resolved   . Depression   . Diverticulosis   . DJD (degenerative joint disease)    hands & back   . Dyspnea    resolved since she stopped smoking   . Epigastric abdominal pain   . Esophageal stricture   . Family history of adverse reaction to anesthesia    daughter has N&V, takes long time to wake up   . Family history of colon cancer   . Family history of ovarian cancer   . Family hx of colon cancer   . 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 Ganglion cyst   . GERD (gastroesophageal reflux disease)   . Headache    low grade currently , family history of migraines   . Hemorrhoid   . History of radiation therapy 05/03 - 08/11/2016   1. 4 field Right breast was treated to 50.4 Gy in 25 fractions at 1.8 Gy per fraction. 2. The Right breast was boosted to 10 Gy in 5 fractions at 2 Gy per fraction.  . Hypothyroidism   . Plantar fasciitis, bilateral   . PONV (postoperative nausea and vomiting)    gets anxious with the mask on her face, also remarks that the scop. patch has helped in the past       SURGICAL HISTORY: Past Surgical History:  Procedure Laterality Date  . ABDOMINAL HYSTERECTOMY    . BREAST RECONSTRUCTION WITH PLACEMENT OF TISSUE EXPANDER AND FLEX HD (ACELLULAR HYDRATED DERMIS) Bilateral 05/19/2016   Procedure: BREAST RECONSTRUCTION WITH PLACEMENT OF TISSUE EXPANDER AND ALLODERM PLACEMENT;  Surgeon: Irene Limbo, MD;  Location: [facility];  Service: Plastics;  Laterality: Bilateral;  . CESAREAN SECTION  1988  .  CHOLECYSTECTOMY    . HEMORRHOID SURGERY    . LAPAROSCOPIC BILATERAL SALPINGECTOMY N/A 10/26/2012   Procedure: operative laparoscopy with lysis of adhesions;  Surgeon: Thornell Sartorius, MD;  Location: [facility];  Service: Gynecology;  Laterality: N/A;  . MASTECTOMY Bilateral   . MASTECTOMY WITH RADIOACTIVE SEED GUIDED EXCISION AND AXILLARY SENTINEL LYMPH NODE BIOPSY Bilateral 05/19/2016   Procedure: RIGHT SKIN SPARING MASTECTOMY WITH RIGHT RADIOACTIVE SEED TARGETED DISSECTION AND RIGHT SENTINEL LYMPH NODE BIOPSY, LEFT PROPHYLACTIC SKIN SPARING MASTECTOMY;  Surgeon: Rolm Bookbinder, MD;  Location: [facility];  Service: General;  Laterality: Bilateral;  . REMOVAL OF BILATERAL TISSUE EXPANDERS WITH PLACEMENT OF BILATERAL  BREAST IMPLANTS Bilateral 01/29/2017   Procedure: REMOVAL OF BILATERAL TISSUE EXPANDERS WITH PLACEMENT OF BILATERAL SILICONE BREAST IMPLANTS, ALLODERM TO LEFT BREAST RECONSTRUCTION;  RIGHT LATISSUMUS FLAP;  Surgeon: Irene Limbo, MD;  Location: [facility];  Service: Plastics;  Laterality: Bilateral;  Requesting RNFA  . TONSILLECTOMY    . TUBAL LIGATION    . urologic surgery for ureteropelvic junction obstruction      SOCIAL HISTORY: Social History   Socioeconomic History  . Marital status: Married    Spouse name: Not on file  . Number of children: 2  . Years of education: 27  . Highest education level: Not on file  Occupational History  . Occupation: Passenger transport manager, Games developer: [employer]  Social Needs  . Financial resource strain: Not on file  . Food insecurity:    Worry: Not on file    Inability: Not on file  . Transportation needs:    Medical: Not on file    Non-medical: Not on file  Tobacco Use  . Smoking status: Former Smoker    Packs/day: 0.50    Years: 15.00    Pack years: 7.50    Types: Cigarettes    Last attempt to quit: 04/08/2016    Years since quitting: 1.2  . Smokeless tobacco: Never Used  Substance and Sexual Activity  .  Alcohol use: Yes    Alcohol/week: 8.4 oz    Types: 12 Cans of beer, 2 Standard drinks or equivalent per week    Comment: moderate, daily   . Drug use: No  . Sexual activity: Yes    Partners: Male    Birth control/protection: Surgical  Lifestyle  . Physical activity:    Days per week: Not on file    Minutes per session: Not on file  . Stress: Not on file  Relationships  . Social connections:    Talks on phone: Not on file    Gets together: Not on file    Attends religious service: Not on file    Active member of club or organization: Not on file    Attends meetings of clubs or organizations: Not on file    Relationship status: Not on file  . Intimate partner violence:    Fear of current or ex partner: Not on file    Emotionally abused: Not on file    Physically abused: Not on file    Forced sexual activity: Not on file  Other Topics Concern  . Not on file  Social History Narrative   HSG, some community college. .Married -'81.   2 daughters  '83, '88 daughter  with bipolar dz. Has had behavior issues. 1 granddaughter '05 living with her. Occupation:Bank worker. Marriage in good. No history of abuse.                   FAMILY HISTORY: Family History  Problem Relation Age of Onset  . COPD Mother   . Ovarian cancer Mother 13       surgery, no chemo  . Kidney disease Mother   . Diabetes Father   . Heart disease Father   . Hypertension Father   . Colon cancer Father 83  . Other Father        Amyloidosis  . Cancer Father        2nd cancer later in life, type unk  . Hypertension Sister   . Depression Sister   . Arthritis Sister   . Von Willebrand disease Sister   .  Breast cancer Paternal Grandmother        pt doesn't remember, but her sister says this grandmother had br cancer  . Goiter Paternal Grandmother   . Penile cancer Paternal Uncle 57  . Rheum arthritis Maternal Grandmother     ALLERGIES:  is allergic to adhesive [tape] and promethazine  hcl.  MEDICATIONS:  Current Outpatient Medications  Medication Sig Dispense Refill  . acetaminophen (TYLENOL) 500 MG tablet Take 1,000 mg by mouth every 6 (six) hours as needed.    . ALPRAZolam (XANAX) 1 MG tablet Take 1 tablet (1 mg total) by mouth 3 (three) times daily. 90 tablet 0  . celecoxib (CELEBREX) 200 MG capsule Take 1 capsule (200 mg total) by mouth daily. 30 capsule 2  . docusate sodium (COLACE) 100 MG capsule Take 100 mg by mouth daily as needed for mild constipation.    Marland Kitchen exemestane (AROMASIN) 25 MG tablet Take 1 tablet (25 mg total) by mouth daily after breakfast. 90 tablet 0  . ferrous sulfate 325 (65 FE) MG tablet Take 325 mg by mouth daily with breakfast.    . HYDROcodone-acetaminophen (NORCO/VICODIN) 5-325 MG tablet Take 1-2 tablets by mouth every 4 (four) hours as needed for moderate pain. 40 tablet 0  . levothyroxine (SYNTHROID, LEVOTHROID) 50 MCG tablet Take 50 mcg by mouth daily before breakfast.    . omeprazole (PRILOSEC) 40 MG capsule TAKE ONE CAPSULE BY MOUTH EVERY DAY 30 capsule 4  . venlafaxine XR (EFFEXOR-XR) 150 MG 24 hr capsule Take 1 capsule (150 mg total) by mouth daily with breakfast. 90 capsule 1  . anastrozole (ARIMIDEX) 1 MG tablet Take 1 tablet (1 mg total) by mouth daily. 30 tablet 5  . DENTA 5000 PLUS 1.1 % CREA dental cream     . methocarbamol (ROBAXIN) 500 MG tablet Take 1 tablet (500 mg total) by mouth every 8 (eight) hours as needed for muscle spasms. (Patient not taking: Reported on 06/21/2017) 30 tablet 0  . venlafaxine XR (EFFEXOR-XR) 75 MG 24 hr capsule Take 1 capsule (75 mg total) by mouth daily with breakfast. 30 capsule 5   No current facility-administered medications for this visit.     REVIEW OF SYSTEMS:   Constitutional: Denies fevers, chills or abnormal night sweats; (+) hot flashes Eyes: Denies blurriness of vision, double vision or watery eyes Ears, nose, mouth, throat, and face: Denies mucositis or sore throat Respiratory: Denies  wheezes (+) dry cough (+) SOB on exertion  Cardiovascular: Denies palpitation, chest discomfort or lower extremity swelling Gastrointestinal:  Denies nausea, heartburn or change in bowel habits Skin: Denies abnormal skin rashes Lymphatics: Denies new lymphadenopathy or easy bruising Neurological:Denies numbness, tingling or new weaknesses Behavioral/Psych: Mood is stable, no new changes  MSK: (+) joint stiffness in her hands and back pain  All other systems were reviewed with the patient and are negative.  PHYSICAL EXAMINATION: ECOG PERFORMANCE STATUS: 0 - Asymptomatic  Vitals:   06/21/17 1540  BP: 138/83  Pulse: 88  Resp: 18  Temp: 99 F (37.2 C)  SpO2: 98%   Filed Weights   06/21/17 1540  Weight: 176 lb 3.2 oz (79.9 kg)   GENERAL:alert, no distress and comfortable SKIN: skin color, texture, turgor are normal, no rashes or significant lesions EYES: normal, conjunctiva are pink and non-injected, sclera clear OROPHARYNX:no exudate, no erythema and lips, buccal mucosa, and tongue normal  NECK: supple, thyroid normal size, non-tender, without nodularity LYMPH:  no palpable lymphadenopathy in the cervical, axillary or inguinal LUNGS:  clear to auscultation and percussion with normal breathing effort HEART: regular rate & rhythm and no murmurs and no lower extremity edema ABDOMEN:abdomen soft, non-tender and normal bowel sounds Musculoskeletal:no cyanosis of digits and no clubbing  PSYCH: alert & oriented x 3 with fluent speech NEURO: no focal motor/sensory deficits Breasts: Status post bilateral mastectomy and breast implant placement. Incisions healed well. No erythema or discharge. Right breast appears a little smaller than left. Otherwise no palpable mass or adenopathy.   LABORATORY DATA:  I have reviewed the data as listed CBC Latest Ref Rng & Units 06/21/2017 02/19/2017 01/26/2017  WBC 3.9 - 10.3 K/uL 5.8 4.9 5.0  Hemoglobin 11.6 - 15.9 g/dL 11.9 11.2(L) 15.7(H)  Hematocrit  34.8 - 46.6 % 38.9 34.9 48.4(H)  Platelets 145 - 400 K/uL 254 302 225    CMP Latest Ref Rng & Units 06/21/2017 02/19/2017 01/26/2017  Glucose 70 - 140 mg/dL 99 99 98  BUN 7 - 26 mg/dL 19 16.4 12  Creatinine 0.60 - 1.10 mg/dL 0.89 0.9 0.97  Sodium 136 - 145 mmol/L 139 141 140  Potassium 3.5 - 5.1 mmol/L 3.8 4.0 4.1  Chloride 98 - 109 mmol/L 104 - 102  CO2 22 - 29 mmol/L '25 26 28  ' Calcium 8.4 - 10.4 mg/dL 9.4 8.8 9.3  Total Protein 6.4 - 8.3 g/dL 6.8 6.2(L) -  Total Bilirubin 0.2 - 1.2 mg/dL 0.3 0.39 -  Alkaline Phos 40 - 150 U/L 101 88 -  AST 5 - 34 U/L 23 22 -  ALT 0 - 55 U/L 27 23 -   PATHOLOGY  Diagnosis (05/19/16) 1. Lymph nodes, regional resection, Right axillary - METASTATIC CARCINOMA IN 2 OF 7 LYMPH NODES (2/7). - SEE COMMENT. 2. Breast, simple mastectomy, Left - LOBULAR NEOPLASIA (ATYPICAL LOBULAR HYPERPLASIA). - FIBROCYSTIC CHANGES WITH ADENOSIS AND CALCIFICATIONS. 3. Breast, simple mastectomy, Right - INVASIVE MIXED LOBULAR-DUCTAL CARCINOMA GRADE I/III, MULTIPLE FOCI, THE LARGEST SPANS 3.0 CM. - LOBULAR NEOPLASIA (ATYPICAL LOBULAR HYPERPLASIA). - ATYPICAL DUCTAL HYPERPLASIA. - LYMPHOVASCULAR INVASION IS IDENTIFIED. - THE SURGICAL RESECTION MARGINS ARE NEGATIVE FOR CARCINOMA. - SEE ONCOLOGY TABLE BELOW. 4. Skin , Right Mastectomy Flap - BENIGN SKIN. Microscopic Comment 1. Immunohistochemical stains performed on multiple blocks on part 1 highlight the presence of carcinoma. 3. BREAST Procedure: Simple mastectomy and right axillary lymph node resection. Laterality: Carcinoma is on the right. Tumor Size: Multiple (at least 6) foci spanning from microscopic foci to a lesion grossly measuring 3.0 cm. Histologic Type: Mixed lobular-tubular. Grade: I Tubular Differentiation: 1-2 Nuclear Pleomorphism: 2 Mitotic Count: 1 Ductal Carcinoma in Situ (DCIS): Not identified, however, atypical lobular hyperplasia and atypical ductal hyperplasia are identified. Regional Lymph  Nodes: Number of Lymph Nodes Examined: 7 1 of 4 FINAL for [name], [name] ([id number]) Microscopic Comment(continued) Number of Sentinel Lymph Nodes Examined: Unknown. Lymph Nodes with Macrometastases: 2 Lymph Nodes with Micrometastases: 0 Lymph Nodes with Isolated Tumor Cells: 0 Margins: Greater than 0.2 cm to all margins Extent of Tumor: Confined to breast parenchyma. Breast Prognostic Profile # [phone] Estrogen Receptor: Positive, strong. Progesterone Receptor: Positive, strong. Her2: No amplification was detected. Ki-67: Ranges from 15% - 40%. TNM: mpT2, pN1a COMMENTS: There are multiple biopsy clips identified in the right mastectomy specimen. Grossly, there are at least four foci of similar appearing invasive lobular-tubular carcinoma present, the largest spans 3.0 cm. In addition, there is similar appearing invasive carcinoma present in random tissue submitted from the upper inner quadrant and the upper outer quadrant. The phenotype diagnosed  above is supported by patchy but strong staining for E-cadherin in some places and negative for E-cadherin in other places. Most (approximately 80%) of the carcinoma appears negative for e-cadherin, so the phenotype is predominantly lobular. No additional prognostic profiles will be performed, unless requested by a clinician. (JBK:gt, 05/21/16) Enid Cutter MD Pathologist, Electronic Signature (Case signed 05/21/2016)  Mammaprint 03/25/16   Diagnosis 03/25/2016 Breast, right, needle core biopsy, 11:00 o'clock - INVASIVE MAMMARY CARCINOMA. - SEE COMMENT. Microscopic Comment The carcinoma appears grade 2. An E-cadherin stain will be performed and the results reported separately. A breast prognostic profile will be performed and the results reported separately. The results were called to [facility] on 03/26/16. (JBK:gt, 03/26/16) An E-Cadherin stain was performed revealing that some tumor cells (approximately 10%) are  positive for E-Cadherin, supporting at least a focal ductal phenotype. The remainder of the tumor cells appear negative for E-Cadherin. (JBK:kh 03/27/16)  Results: IMMUNOHISTOCHEMICAL AND MORPHOMETRIC ANALYSIS PERFORMED MANUALLY Estrogen Receptor: 95%, POSITIVE, STRONG STAINING INTENSITY Progesterone Receptor: 90%, POSITIVE, STRONG STAINING INTENSITY Proliferation Marker Ki67: 15%  Results: HER2 - NEGATIVE RATIO OF HER2/CEP17 SIGNALS 1.12 AVERAGE HER2 COPY NUMBER PER CELL 1.90  Diagnosis 03/23/2016 1. Breast, right, needle core biopsy, 9:30 o'clock - INVASIVE MAMMARY CARCINOMA, SEE COMMENT. 2. Breast, right, needle core biopsy, 11:00 o'clock - FIBROCYSTIC CHANGE AND DENSE FIBROSIS. - NO MALIGNANCY IDENTIFIED. 3. Lymph node, needle/core biopsy, right - METASTATIC MAMMARY CARCINOMA. Microscopic Comment 1. While grading is best performed on the resection specimen, the carcinoma appears grade 1. While a ductal phenotype is favored, an e-cadherin will be performed and reported in an add-on. Prognostic markers will be ordered. Dr. Saralyn Pilar has reviewed all 3 parts of the case. The case was called to Dr. Marcelo Baldy on 03/24/2016.  1. PROGNOSTIC INDICATORS Results: IMMUNOHISTOCHEMICAL AND MORPHOMETRIC ANALYSIS PERFORMED MANUALLY Estrogen Receptor: 100%, POSITIVE, STRONG STAINING INTENSITY Progesterone Receptor: 70%, POSITIVE, STRONG STAINING INTENSITY Proliferation Marker Ki67: 40%  Results: HER2 - NEGATIVE RATIO OF HER2/CEP17 SIGNALS 1.17 AVERAGE HER2 COPY NUMBER PER CELL 2.10  3. PROGNOSTIC INDICATORS Results: IMMUNOHISTOCHEMICAL AND MORPHOMETRIC ANALYSIS PERFORMED MANUALLY Estrogen Receptor: 100%, POSITIVE, STRONG STAINING INTENSITY Progesterone Receptor: 95%, POSITIVE, STRONG STAINING INTENSITY Proliferation Marker Ki67: 40% HER2 - NEGATIVE  1. E-cadherin is positive, consistent with a ductal phenotype.  RADIOGRAPHIC STUDIES: I have personally reviewed the radiological images  as listed and agreed with the findings in the report. No results found. MR Breast Bilateral w wo contrast 03/29/16 IMPRESSION: Large irregular masslike area of enhancement within the upper outer and lower outer right breast compatible with biopsy-proven malignancy. Enlarged right axillary lymph nodes compatible with metastatic adenopathy.  ASSESSMENT & PLAN: 57 y.o.  postmenopausal woman, presented with a palpable right breast mass.  1.  Malignant new overlapping sites of right breast, mpT2 pN1a, stage IA, G1,  ER positive, PR positive, HER-2 negative, mammaprint low risk luminal type A --I previously discussed her surgical path result in details.  -She had a complete surgical resection with negative margins, 2 out of 17 lymph nodes were positive. -the mammaprint result was reviewed with her in detail previously. 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  -She started exemestane before surgery, has been tolerating well without significant side effects, we'll continue, plan for 7-10 years. -We previously discussed the breast cancer surveillance after her surgery. She will continue annual screening mammogram, self exam, and a routine office visit with lab and  exam with [place]. -I have encouraged her to have healthy diet and exercise regularly. -The patient saw Dr. Iran Planas of plastic surgery on 06/15/16 who discussed implant exchange in late December/approximately 6 months from the end of radiation. -The patient proceeded with radiation to the right chest wall and right axilla starting 06/25/16. She completed RT 08/11/16 has had ongoing hot flashes since. -She has noticed a subcutaneous 2 cm nodule below the incision line of the inferior right breast, movable, likely a cyst. She will follow-up with Dr. Iran Planas in a few weeks, if needed we can obtain ultrasound for further evaluation. -She is having worsening joint stiffness  and pain in her hands, back pain and hot flashes. She would like to switch Exemestane to Anastrozole to see if that will improve her symptoms. I recommend her to stop Exemestane now and start Anastrozole in 2-3 weeks after her surgery. If she symptoms improve while off the medication that will confirm it was caused by medication.  -Today, she is clinically doing well, recovered from breast reconstruction surgery in Dec 2018. Labs reviewed, otherwise normal. Her physical exam was unremarkable. There is no concern for recurrence. -F/u in 4 months   2. Insomnia  -She has chronic insomnia, previous took Ambien before. Currently having difficulty sleeping, was attempting to take Benadryl for this with some improvement. -I encouraged her to discuss with her primary care physician.  I cautioned against long term usage of ambien and advised her that this could be hard to come off of later down the road.   3. Hot flashes -Continue taking Effexor 154m daily with some improvement; this was increased when she called our office on (09/08/16). Will continue  -She still has persistent hot flashes, will increase her Effexor dose to 225 mg daily. This is the highest dose   4. SOB on Exertion  -She has complaints of SOB on exertion and a mild dry cough. She does have a history of smoking and has never been diagnosed with COPD.  -Her SPO2 while seated is 98%, 95-96% after walking for 5 minutes  -I discussed the option for a CXR, she will think about it. I think her dyspnea is likely related to her decondition after multiple surgery. I encourage her to exercise   5. Easy bruising  -she reports history of easy bruising, no history of internal bleeding -her sister was diagnosed with VWD lately -will check VWD panel on next visit    Plan: -Revision of back scar with Dr TAshley Marineron 06/28/17 -Discontinue Exemestane now and switch to anastrozole in 3 weeks   -Increased Effexor dose to 225 mg daily  -Von Willebrand  panel next visit -F/u in 4 months    No orders of the defined types were placed in this encounter.  All questions were answered. The patient knows to call the clinic with any problems, questions or concerns.  I spent 30 minutes counseling the patient face to face. The total time spent in the appointment was 35 minutes and more than 50% was on counseling.  This document serves as a record of services personally performed by YTruitt Merle MD. It was created on her behalf by DTheresia Bough a trained medical scribe. The creation of this record is based on the scribe's personal observations and the provider's statements to them.   I have reviewed the above documentation for accuracy and completeness, and I agree with the above.    YTruitt Merle MD 06/21/2017

## 2017-06-23 NOTE — H&P (Signed)
  Subjective:   Patient ID: [name] is a 57 y.o. female.  4 months post op LD flap and implant exchange. Not satisfied with left reconstruction and dog ear back. Reports B cup and feels larger than this. Satisfied with right side, likes this shape better.   Presented with palpable right breast lump with associated pain. Diagnostic MMG and right US showed a 3.6 cm irregular mass in the right breast 11:00 position, with an enlarged lymph node in the right axilla. Additional 7 mm oval mass in the right breast LOQ present and suspicious for malignancy.  Biopsy of right breast at 9:30 o'clock position showed invasive mammary carcinoma. Biopsy of the right breast at the 11:00 o'clock position showed fibrocystic change and dense fibrosis with no malignancy identified. Right lymph node was positive for metastatic mammary carcinoma. Repeat biopsy of the right breast at the 11:00 o'clock position showed invasive mammary carcinoma. All ER/PR+, Her2-.  MRI howed large irregular masslike area of enhancement within the upper outer and lower outer right breast compatible with biopsy-proven malignancy with enlarged right axillary lymph nodes compatible with metastatic adenopathy.  Mammaprint low risk.  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 Completed adjuvant radiation 6.19.18.  Prior C or D. Right mastectomy 731 left 713 g  Former smoker- quit prior to surgery.   Objective:  Physical Exam  Cardiovascular: Normal rate, regular rhythm and normal heart sounds.  Pulmonary/Chest: Effort normal and breath sounds normal.   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 Plan revision left reconstruction with ADM to support and narrow pocket, excision of mastectomy flap fullness, silicone implant exchange. Discussed use of IMF scar, drain. Discussed post procedure limitations. She is requesting leave for 4 weeks, to return June 3. She will bring paperwork to office.   With regards to implant size- she is satisfied with right breast. She feels left breast too large. Currently has same size implant on each side. Reviewed with her that she needs implant with similar base width to present. We discussed last visit decreasing projection. Following discussion today will plan to keep same volume implants.  Additional risks including but not limited to bleeding, seroma, hematoma, capsular contracture, rupture implants, MRI surveillance silicone implants, wound healing problems, anesthesia, DVT/PT, cardiopulmonary complications reviewed.  Rx for Norco and Bactrim given.  Natrelle Inspira Smooth Round Extra Projection 560 ml implants placed bilateral REF SRX-560  Irene Limbo, MD [facility] Plastic & Reconstructive Surgery [phone], pin [phone]

## 2017-06-28 ENCOUNTER — Encounter (HOSPITAL_BASED_OUTPATIENT_CLINIC_OR_DEPARTMENT_OTHER): Payer: Self-pay | Admitting: Anesthesiology

## 2017-06-28 ENCOUNTER — Encounter (HOSPITAL_BASED_OUTPATIENT_CLINIC_OR_DEPARTMENT_OTHER)
Admission: RE | Disposition: A | Discharge status: Home / Self Care | Payer: Self-pay | Source: Ambulatory Visit | Attending: Plastic Surgery

## 2017-06-28 ENCOUNTER — Ambulatory Visit (HOSPITAL_BASED_OUTPATIENT_CLINIC_OR_DEPARTMENT_OTHER): Payer: BLUE CROSS/BLUE SHIELD | Admitting: Anesthesiology

## 2017-06-28 ENCOUNTER — Other Ambulatory Visit: Payer: Self-pay

## 2017-06-28 ENCOUNTER — Ambulatory Visit (HOSPITAL_BASED_OUTPATIENT_CLINIC_OR_DEPARTMENT_OTHER)
Admission: RE | Admit: 2017-06-28 | Discharge: 2017-06-28 | Disposition: A | Discharge status: Home / Self Care | Payer: BLUE CROSS/BLUE SHIELD | Source: Ambulatory Visit | Attending: Plastic Surgery | Admitting: Plastic Surgery

## 2017-06-28 DIAGNOSIS — F329 Major depressive disorder, single episode, unspecified: Secondary | ICD-10-CM | POA: Diagnosis not present

## 2017-06-28 DIAGNOSIS — Z853 Personal history of malignant neoplasm of breast: Secondary | ICD-10-CM | POA: Insufficient documentation

## 2017-06-28 DIAGNOSIS — G4730 Sleep apnea, unspecified: Secondary | ICD-10-CM | POA: Diagnosis not present

## 2017-06-28 DIAGNOSIS — F419 Anxiety disorder, unspecified: Secondary | ICD-10-CM | POA: Insufficient documentation

## 2017-06-28 DIAGNOSIS — Z421 Encounter for breast reconstruction following mastectomy: Secondary | ICD-10-CM | POA: Insufficient documentation

## 2017-06-28 DIAGNOSIS — Z9989 Dependence on other enabling machines and devices: Secondary | ICD-10-CM | POA: Insufficient documentation

## 2017-06-28 DIAGNOSIS — Z888 Allergy status to other drugs, medicaments and biological substances status: Secondary | ICD-10-CM | POA: Diagnosis not present

## 2017-06-28 DIAGNOSIS — Z9013 Acquired absence of bilateral breasts and nipples: Secondary | ICD-10-CM | POA: Insufficient documentation

## 2017-06-28 DIAGNOSIS — M1990 Unspecified osteoarthritis, unspecified site: Secondary | ICD-10-CM | POA: Insufficient documentation

## 2017-06-28 DIAGNOSIS — K219 Gastro-esophageal reflux disease without esophagitis: Secondary | ICD-10-CM | POA: Diagnosis not present

## 2017-06-28 DIAGNOSIS — Z8589 Personal history of malignant neoplasm of other organs and systems: Secondary | ICD-10-CM | POA: Insufficient documentation

## 2017-06-28 DIAGNOSIS — E039 Hypothyroidism, unspecified: Secondary | ICD-10-CM | POA: Insufficient documentation

## 2017-06-28 DIAGNOSIS — Z87891 Personal history of nicotine dependence: Secondary | ICD-10-CM | POA: Diagnosis not present

## 2017-06-28 DIAGNOSIS — Z923 Personal history of irradiation: Secondary | ICD-10-CM | POA: Insufficient documentation

## 2017-06-28 DIAGNOSIS — L905 Scar conditions and fibrosis of skin: Secondary | ICD-10-CM | POA: Diagnosis not present

## 2017-06-28 HISTORY — PX: SCAR REVISION: SHX5285

## 2017-06-28 HISTORY — PX: BREAST RECONSTRUCTION WITH PLACEMENT OF TISSUE EXPANDER AND FLEX HD (ACELLULAR HYDRATED DERMIS): SHX6295

## 2017-06-28 SURGERY — REVISION, SCAR
Anesthesia: General | Site: Breast | Laterality: Right

## 2017-06-28 MED ORDER — SCOPOLAMINE 1 MG/3DAYS TD PT72
1.0000 | MEDICATED_PATCH | Freq: Once | TRANSDERMAL | Status: DC | PRN
  Administered 2017-06-28: 1.5 mg via TRANSDERMAL

## 2017-06-28 MED ORDER — ROCURONIUM BROMIDE 10 MG/ML (PF) SYRINGE
PREFILLED_SYRINGE | INTRAVENOUS | Status: AC
  Filled 2017-06-28: qty 5

## 2017-06-28 MED ORDER — ESMOLOL HCL 100 MG/10ML IV SOLN
INTRAVENOUS | Status: DC | PRN
  Administered 2017-06-28: 30 mg via INTRAVENOUS

## 2017-06-28 MED ORDER — PROPOFOL 500 MG/50ML IV EMUL
INTRAVENOUS | Status: AC
  Filled 2017-06-28: qty 50

## 2017-06-28 MED ORDER — ACETAMINOPHEN 500 MG PO TABS
1000.0000 mg | ORAL_TABLET | ORAL | Status: AC
  Administered 2017-06-28: 1000 mg via ORAL

## 2017-06-28 MED ORDER — ONDANSETRON HCL 4 MG/2ML IJ SOLN
INTRAMUSCULAR | Status: DC | PRN
  Administered 2017-06-28: 4 mg via INTRAVENOUS

## 2017-06-28 MED ORDER — ACETAMINOPHEN 500 MG PO TABS
ORAL_TABLET | ORAL | Status: AC
  Filled 2017-06-28: qty 2

## 2017-06-28 MED ORDER — PROPOFOL 10 MG/ML IV BOLUS
INTRAVENOUS | Status: DC | PRN
  Administered 2017-06-28: 180 mg via INTRAVENOUS

## 2017-06-28 MED ORDER — HYDROMORPHONE HCL 1 MG/ML IJ SOLN
INTRAMUSCULAR | Status: AC
  Filled 2017-06-28: qty 0.5

## 2017-06-28 MED ORDER — DEXAMETHASONE SODIUM PHOSPHATE 10 MG/ML IJ SOLN
INTRAMUSCULAR | Status: AC
  Filled 2017-06-28: qty 1

## 2017-06-28 MED ORDER — ONDANSETRON HCL 4 MG/2ML IJ SOLN
INTRAMUSCULAR | Status: AC
  Filled 2017-06-28: qty 2

## 2017-06-28 MED ORDER — CHLORHEXIDINE GLUCONATE CLOTH 2 % EX PADS: 6.0000 | MEDICATED_PAD | Freq: Once | CUTANEOUS | Status: DC

## 2017-06-28 MED ORDER — LIDOCAINE HCL (CARDIAC) PF 100 MG/5ML IV SOSY
PREFILLED_SYRINGE | INTRAVENOUS | Status: AC
  Filled 2017-06-28: qty 5

## 2017-06-28 MED ORDER — DEXAMETHASONE SODIUM PHOSPHATE 4 MG/ML IJ SOLN
INTRAMUSCULAR | Status: DC | PRN
  Administered 2017-06-28: 10 mg via INTRAVENOUS

## 2017-06-28 MED ORDER — LIDOCAINE HCL (CARDIAC) PF 100 MG/5ML IV SOSY
PREFILLED_SYRINGE | INTRAVENOUS | Status: DC | PRN
  Administered 2017-06-28: 60 mg via INTRAVENOUS

## 2017-06-28 MED ORDER — GABAPENTIN 300 MG PO CAPS
ORAL_CAPSULE | ORAL | Status: AC
  Filled 2017-06-28: qty 1

## 2017-06-28 MED ORDER — CELECOXIB 200 MG PO CAPS
ORAL_CAPSULE | ORAL | Status: AC
  Filled 2017-06-28: qty 1

## 2017-06-28 MED ORDER — CEFAZOLIN SODIUM-DEXTROSE 2-4 GM/100ML-% IV SOLN
INTRAVENOUS | Status: AC
  Filled 2017-06-28: qty 100

## 2017-06-28 MED ORDER — METOCLOPRAMIDE HCL 5 MG/ML IJ SOLN: 10.0000 mg | Freq: Once | INTRAMUSCULAR | Status: DC | PRN

## 2017-06-28 MED ORDER — SUGAMMADEX SODIUM 200 MG/2ML IV SOLN
INTRAVENOUS | Status: AC
  Filled 2017-06-28: qty 2

## 2017-06-28 MED ORDER — FENTANYL CITRATE (PF) 100 MCG/2ML IJ SOLN
INTRAMUSCULAR | Status: AC
  Filled 2017-06-28: qty 2

## 2017-06-28 MED ORDER — SCOPOLAMINE 1 MG/3DAYS TD PT72
MEDICATED_PATCH | TRANSDERMAL | Status: AC
  Filled 2017-06-28: qty 1

## 2017-06-28 MED ORDER — CEFAZOLIN SODIUM-DEXTROSE 2-4 GM/100ML-% IV SOLN
2.0000 g | INTRAVENOUS | Status: AC
  Administered 2017-06-28: 2 g via INTRAVENOUS

## 2017-06-28 MED ORDER — GENTAMICIN SULFATE 40 MG/ML IJ SOLN
INTRAMUSCULAR | Status: DC | PRN
  Administered 2017-06-28: 1000 mL

## 2017-06-28 MED ORDER — GABAPENTIN 300 MG PO CAPS
300.0000 mg | ORAL_CAPSULE | ORAL | Status: AC
  Administered 2017-06-28: 300 mg via ORAL

## 2017-06-28 MED ORDER — CELECOXIB 200 MG PO CAPS
200.0000 mg | ORAL_CAPSULE | ORAL | Status: AC
  Administered 2017-06-28: 200 mg via ORAL

## 2017-06-28 MED ORDER — PHENYLEPHRINE 40 MCG/ML (10ML) SYRINGE FOR IV PUSH (FOR BLOOD PRESSURE SUPPORT)
PREFILLED_SYRINGE | INTRAVENOUS | Status: DC | PRN
  Administered 2017-06-28 (×2): 40 ug via INTRAVENOUS
  Administered 2017-06-28: 80 ug via INTRAVENOUS

## 2017-06-28 MED ORDER — PHENYLEPHRINE 40 MCG/ML (10ML) SYRINGE FOR IV PUSH (FOR BLOOD PRESSURE SUPPORT)
PREFILLED_SYRINGE | INTRAVENOUS | Status: AC
  Filled 2017-06-28: qty 10

## 2017-06-28 MED ORDER — ROCURONIUM BROMIDE 100 MG/10ML IV SOLN
INTRAVENOUS | Status: DC | PRN
  Administered 2017-06-28: 50 mg via INTRAVENOUS

## 2017-06-28 MED ORDER — HYDROMORPHONE HCL 1 MG/ML IJ SOLN
0.2500 mg | INTRAMUSCULAR | Status: DC | PRN
  Administered 2017-06-28 (×3): 0.5 mg via INTRAVENOUS

## 2017-06-28 MED ORDER — MIDAZOLAM HCL 2 MG/2ML IJ SOLN
INTRAMUSCULAR | Status: AC
  Filled 2017-06-28: qty 2

## 2017-06-28 MED ORDER — MEPERIDINE HCL 25 MG/ML IJ SOLN: 6.2500 mg | INTRAMUSCULAR | Status: DC | PRN

## 2017-06-28 MED ORDER — FENTANYL CITRATE (PF) 100 MCG/2ML IJ SOLN
50.0000 ug | INTRAMUSCULAR | Status: DC | PRN
  Administered 2017-06-28: 100 ug via INTRAVENOUS

## 2017-06-28 MED ORDER — LACTATED RINGERS IV SOLN
INTRAVENOUS | Status: DC
  Administered 2017-06-28 (×2): via INTRAVENOUS

## 2017-06-28 MED ORDER — MIDAZOLAM HCL 2 MG/2ML IJ SOLN
1.0000 mg | INTRAMUSCULAR | Status: DC | PRN
  Administered 2017-06-28: 2 mg via INTRAVENOUS

## 2017-06-28 MED ORDER — SUGAMMADEX SODIUM 200 MG/2ML IV SOLN
INTRAVENOUS | Status: DC | PRN
  Administered 2017-06-28: 200 mg via INTRAVENOUS

## 2017-06-28 MED ORDER — HYDROCODONE-ACETAMINOPHEN 7.5-325 MG PO TABS: 1.0000 | ORAL_TABLET | Freq: Once | ORAL | Status: DC | PRN

## 2017-06-28 SURGICAL SUPPLY — 93 items
ADH SKN CLS APL DERMABOND .7 (GAUZE/BANDAGES/DRESSINGS) ×4
ALLODERM RTU THIN 8X16 (Tissue) ×3 IMPLANT
APL SKNCLS STERI-STRIP NONHPOA (GAUZE/BANDAGES/DRESSINGS)
BAG DECANTER FOR FLEXI CONT (MISCELLANEOUS) ×3 IMPLANT
BENZOIN TINCTURE PRP APPL 2/3 (GAUZE/BANDAGES/DRESSINGS) IMPLANT
BINDER BREAST LRG (GAUZE/BANDAGES/DRESSINGS) IMPLANT
BINDER BREAST MEDIUM (GAUZE/BANDAGES/DRESSINGS) IMPLANT
BINDER BREAST XLRG (GAUZE/BANDAGES/DRESSINGS) ×3 IMPLANT
BINDER BREAST XXLRG (GAUZE/BANDAGES/DRESSINGS) IMPLANT
BLADE CLIPPER SURG (BLADE) IMPLANT
BLADE SURG 10 STRL SS (BLADE) ×3 IMPLANT
BLADE SURG 11 STRL SS (BLADE) IMPLANT
BLADE SURG 15 STRL LF DISP TIS (BLADE) ×4 IMPLANT
BLADE SURG 15 STRL SS (BLADE) ×6
BNDG GAUZE ELAST 4 BULKY (GAUZE/BANDAGES/DRESSINGS) ×2 IMPLANT
CANISTER SUCT 1200ML W/VALVE (MISCELLANEOUS) ×3 IMPLANT
CHLORAPREP W/TINT 26ML (MISCELLANEOUS) ×6 IMPLANT
COVER BACK TABLE 60X90IN (DRAPES) ×3 IMPLANT
COVER MAYO STAND STRL (DRAPES) ×6 IMPLANT
DECANTER SPIKE VIAL GLASS SM (MISCELLANEOUS) IMPLANT
DERMABOND ADVANCED (GAUZE/BANDAGES/DRESSINGS) ×2
DERMABOND ADVANCED .7 DNX12 (GAUZE/BANDAGES/DRESSINGS) ×4 IMPLANT
DRAIN CHANNEL 15F RND FF W/TCR (WOUND CARE) ×3 IMPLANT
DRAPE LAPAROSCOPIC ABDOMINAL (DRAPES) IMPLANT
DRAPE TOP ARMCOVERS (MISCELLANEOUS) ×3 IMPLANT
DRAPE U-SHAPE 76X120 STRL (DRAPES) ×1 IMPLANT
DRSG PAD ABDOMINAL 8X10 ST (GAUZE/BANDAGES/DRESSINGS) ×3 IMPLANT
DRSG TEGADERM 2-3/8X2-3/4 SM (GAUZE/BANDAGES/DRESSINGS) ×4 IMPLANT
DRSG TEGADERM 4X10 (GAUZE/BANDAGES/DRESSINGS) IMPLANT
DRSG TEGADERM 4X4.75 (GAUZE/BANDAGES/DRESSINGS) IMPLANT
DRSG TELFA 3X8 NADH (GAUZE/BANDAGES/DRESSINGS) IMPLANT
ELECT BLADE 4.0 EZ CLEAN MEGAD (MISCELLANEOUS) ×3
ELECT COATED BLADE 2.86 ST (ELECTRODE) ×3 IMPLANT
ELECT NEEDLE BLADE 2-5/6 (NEEDLE) ×3 IMPLANT
ELECT REM PT RETURN 9FT ADLT (ELECTROSURGICAL) ×3
ELECT REM PT RETURN 9FT PED (ELECTROSURGICAL)
ELECTRODE BLDE 4.0 EZ CLN MEGD (MISCELLANEOUS) ×2 IMPLANT
ELECTRODE REM PT RETRN 9FT PED (ELECTROSURGICAL) IMPLANT
ELECTRODE REM PT RTRN 9FT ADLT (ELECTROSURGICAL) ×2 IMPLANT
EVACUATOR SILICONE 100CC (DRAIN) ×3 IMPLANT
GAUZE SPONGE 4X4 12PLY STRL LF (GAUZE/BANDAGES/DRESSINGS) IMPLANT
GAUZE XEROFORM 1X8 LF (GAUZE/BANDAGES/DRESSINGS) IMPLANT
GLOVE BIO SURGEON STRL SZ 6 (GLOVE) ×8 IMPLANT
GLOVE EXAM NITRILE MD LF STRL (GLOVE) ×2 IMPLANT
GLOVE SURG SS PI 7.0 STRL IVOR (GLOVE) ×2 IMPLANT
GOWN STRL REUS W/ TWL LRG LVL3 (GOWN DISPOSABLE) ×4 IMPLANT
GOWN STRL REUS W/TWL LRG LVL3 (GOWN DISPOSABLE) ×6
IMPL BREAST P6.3XRND MDRT 560 (Breast) ×1 IMPLANT
IMPL BRST P6.3XRND MDRT 560CC (Breast) ×2 IMPLANT
IMPLANT BREAST GEL 560CC (Breast) ×3 IMPLANT
IV NS 500ML (IV SOLUTION)
IV NS 500ML BAXH (IV SOLUTION) IMPLANT
KIT FILL SYSTEM UNIVERSAL (SET/KITS/TRAYS/PACK) IMPLANT
MARKER SKIN DUAL TIP RULER LAB (MISCELLANEOUS) IMPLANT
NEEDLE HYPO 25X1 1.5 SAFETY (NEEDLE) IMPLANT
NEEDLE PRECISIONGLIDE 27X1.5 (NEEDLE) IMPLANT
NS IRRIG 1000ML POUR BTL (IV SOLUTION) ×2 IMPLANT
PACK BASIN DAY SURGERY FS (CUSTOM PROCEDURE TRAY) ×3 IMPLANT
PAD DRESSING TELFA 3X8 NADH (GAUZE/BANDAGES/DRESSINGS) IMPLANT
PENCIL BUTTON HOLSTER BLD 10FT (ELECTRODE) ×3 IMPLANT
PIN SAFETY STERILE (MISCELLANEOUS) ×3 IMPLANT
PUNCH BIOPSY DERMAL 4MM (MISCELLANEOUS) ×3 IMPLANT
SHEET MEDIUM DRAPE 40X70 STRL (DRAPES) ×6 IMPLANT
SLEEVE SCD COMPRESS KNEE MED (MISCELLANEOUS) ×3 IMPLANT
SPONGE GAUZE 2X2 8PLY STRL LF (GAUZE/BANDAGES/DRESSINGS) ×3 IMPLANT
SPONGE LAP 18X18 RF (DISPOSABLE) ×9 IMPLANT
STAPLER VISISTAT 35W (STAPLE) IMPLANT
STRIP CLOSURE SKIN 1/2X4 (GAUZE/BANDAGES/DRESSINGS) ×3 IMPLANT
SUT CHROMIC 4 0 PS 2 18 (SUTURE) IMPLANT
SUT ETHILON 2 0 FS 18 (SUTURE) ×3 IMPLANT
SUT ETHILON 4 0 PS 2 18 (SUTURE) IMPLANT
SUT MNCRL AB 4-0 PS2 18 (SUTURE) ×5 IMPLANT
SUT MON AB 5-0 P3 18 (SUTURE) IMPLANT
SUT PDS AB 2-0 CT2 27 (SUTURE) ×12 IMPLANT
SUT VIC AB 0 CT1 27 (SUTURE)
SUT VIC AB 0 CT1 27XBRD ANBCTR (SUTURE) IMPLANT
SUT VIC AB 0 SH 27 (SUTURE) IMPLANT
SUT VIC AB 3-0 SH 27 (SUTURE) ×3
SUT VIC AB 3-0 SH 27X BRD (SUTURE) ×2 IMPLANT
SUT VICRYL 0 CT-2 (SUTURE) IMPLANT
SUT VICRYL 4-0 PS2 18IN ABS (SUTURE) ×3 IMPLANT
SUT VLOC 180 0 24IN GS25 (SUTURE) IMPLANT
SWABSTICK POVIDONE IODINE SNGL (MISCELLANEOUS) ×6 IMPLANT
SYR BULB IRRIGATION 50ML (SYRINGE) ×6 IMPLANT
SYR CONTROL 10ML LL (SYRINGE) IMPLANT
TAPE MEASURE VINYL STERILE (MISCELLANEOUS) ×1 IMPLANT
TISSUE ALLDRM RTU THIN 8X16 (Tissue) ×2 IMPLANT
TOWEL OR 17X24 6PK STRL BLUE (TOWEL DISPOSABLE) ×6 IMPLANT
TRAY DSU PREP LF (CUSTOM PROCEDURE TRAY) IMPLANT
TUBE CONNECTING 20X1/4 (TUBING) ×5 IMPLANT
UNDERPAD 30X30 (UNDERPADS AND DIAPERS) ×6 IMPLANT
YANKAUER SUCT BULB TIP 10FT TU (MISCELLANEOUS) IMPLANT
YANKAUER SUCT BULB TIP NO VENT (SUCTIONS) ×3 IMPLANT

## 2017-06-28 NOTE — Anesthesia Postprocedure Evaluation (Signed)
Anesthesia Post Note  Patient: [name]  Procedure(s) Performed: COMPLEX REVISION OF BACK SCAR (Right Back) LEFT BREAST RECONSTRUCTION WITH SILICONE IMPLANT EXCHANGE AND ACELLULARDERMIS TO LEFT CHEST (Left Breast)     Patient location during evaluation: PACU Anesthesia Type: General Level of consciousness: awake and alert Pain management: pain level controlled Vital Signs Assessment: post-procedure vital signs reviewed and stable Respiratory status: spontaneous breathing, nonlabored ventilation and respiratory function stable Cardiovascular status: blood pressure returned to baseline and stable Postop Assessment: no apparent nausea or vomiting Anesthetic complications: no    Last Vitals:  Vitals:   06/28/17 1345 06/28/17 1401  BP: 114/88 124/86  Pulse: 88 84  Resp: 19 16  Temp:    SpO2: 97% 97%    Last Pain:  Vitals:   06/28/17 1400  TempSrc:   PainSc: 3                  Firmin Belisle A.

## 2017-06-28 NOTE — Anesthesia Procedure Notes (Signed)
Procedure Name: Intubation Date/Time: 06/28/2017 11:31 AM Performed by: Lieutenant Diego, CRNA Pre-anesthesia Checklist: Patient identified, Emergency Drugs available, Suction available and Patient being monitored Patient Re-evaluated:Patient Re-evaluated prior to induction Oxygen Delivery Method: Circle system utilized Preoxygenation: Pre-oxygenation with 100% oxygen Induction Type: IV induction Ventilation: Mask ventilation without difficulty Laryngoscope Size: Miller and 2 Grade View: Grade I Tube type: Oral Tube size: 7.0 mm Number of attempts: 1 Airway Equipment and Method: Stylet and Oral airway Placement Confirmation: ETT inserted through vocal cords under direct vision,  positive ETCO2 and breath sounds checked- equal and bilateral Secured at: 21 cm Tube secured with: Tape Dental Injury: Teeth and Oropharynx as per pre-operative assessment

## 2017-06-28 NOTE — Interval H&P Note (Signed)
History and Physical Interval Note:  06/28/2017 9:56 AM  [name]  has presented today for surgery, with the diagnosis of HISTORY OF BREAST CANCER WITH AQUIRED ABSENSE OF BREAST  The various methods of treatment have been discussed with the patient and family. After consideration of risks, benefits and other options for treatment, the patient has consented to  Procedure(s): COMPLEX REVISION OF BACK SCAR (N/A) LEFT BREAST RECONSTRUCTION WITH SILICONE IMPLANT EXCHANGE AND ACELLULARDERMIS TO LEFT CHEST (Left) as a surgical intervention .  The patient's history has been reviewed, patient examined, no change in status, stable for surgery.  I have reviewed the patient's chart and labs.  Questions were answered to the patient's satisfaction.     [name]

## 2017-06-28 NOTE — Discharge Instructions (Signed)

## 2017-06-28 NOTE — Transfer of Care (Signed)
Immediate Anesthesia Transfer of Care Note  Patient: [name]  Procedure(s) Performed: COMPLEX REVISION OF BACK SCAR (Right Back) LEFT BREAST RECONSTRUCTION WITH SILICONE IMPLANT EXCHANGE AND ACELLULARDERMIS TO LEFT CHEST (Left Breast)  Patient Location: PACU  Anesthesia Type:General  Level of Consciousness: awake and alert   Airway & Oxygen Therapy: Patient Spontanous Breathing and Patient connected to face mask oxygen  Post-op Assessment: Report given to RN and Post -op Vital signs reviewed and stable  Post vital signs: Reviewed and stable  Last Vitals:  Vitals Value Taken Time  BP    Temp    Pulse 95 06/28/2017  1:17 PM  Resp    SpO2 100 % 06/28/2017  1:17 PM  Vitals shown include unvalidated device data.  Last Pain:  Vitals:   06/28/17 1014  TempSrc: Oral  PainSc: 0-No pain         Complications: No apparent anesthesia complications

## 2017-06-28 NOTE — Op Note (Signed)
Operative Note   DATE OF OPERATION: 5.6.19  LOCATION: [facility]-outpatient  SURGICAL DIVISION: Plastic Surgery  PREOPERATIVE DIAGNOSES:  1. History breast cancer 2. Acquired absence breasts 3. History therapeutic radiation  POSTOPERATIVE DIAGNOSES:  same  PROCEDURE:  1. Complex repair back 1 cm 2. Revision left breast reconstruction with silicone implant exchange 3. Acellular dermis to left breast for soft tissue reinforcement 8 x 16 cm  SURGEON: Irene Limbo MD MBA  ASSISTANT: none  ANESTHESIA:  General.   EBL: 20 ml  COMPLICATIONS: None immediate.   INDICATIONS FOR PROCEDURE:  The patient, [name], is a 57 y.o. female born on 22-Dec-1960, is here for revision breast reconstruction with plan acellular dermis left chest for treatment lateral displacement implant. She also has dog hear at medial extent latissimus donor site scar and plan excision of this.   FINDINGS: [name] Smooth Round Extra Projection 560 implant placed. REF SRX-560 [id number]  DESCRIPTION OF PROCEDURE:  The patient's operative site was marked with the patient in the preoperative area to mark sternal notch, desiredanterior axillary lines, chest midline, and area of back scar for excision. The patientwas taken to the operating room. SCDs were placed and IV antibiotics were given. The patient's operative site was prepped and draped in a sterile fashion. A time out was performed and all information was confirmed to be correct.Patient placed in lateral position and sharp excision of redundant back scar completed. Closure completed with 4-0 monocryl in dermis followed by 4-0 monocryl subcuticular skin closure, total length 1 cm. Dry dressing applied and patient returned to supine position.  Redundant mastectomy flap at [facility] on left deepithelialized. Incision made in left inframammary fold and carried through superficial fascia and implant capsule. Intact implant removed. Prior ADM noted to  be partially incorporated and non incorporated ADM excised. Acellular dermis perforated and inset to chest wall with 2-0 PDS  medial to desired anterior axillary line and superior to desired inframammary fold. The ADM was then redraped over prior implant and sutured to anterior capsule with 2-0 PDS. Additional plication suture placed with interrupted 2-0 PDS to chest wall and anterior mastectomy flap capsule. 15 Fr JP placed and secured with 2-0 nylon.  Breast cavity irrigated with solution containing Ancef, gentamicin, and bacitracin, followed by Betadine. Implant placed in breast cavity. Care taken to ensure proper orientation. 2-0 PDS suture used to complete closure of acellular dermis to anterior mastectomy flap. Closure was completed with 3-0 vicryl for approximation of capsule and superficial fascia. 4-0 vicryl was placed in dermis and running 4-0 monocryl was used to close skin. Tissue adhesive applied to breast incisions. Dry dressing and breast binder applied  The patient was allowed to wake from anesthesia, extubated and taken to the recovery room in satisfactory condition.   SPECIMENS: none  DRAINS: 15 Fr JP in left reconstructed breast  Irene Limbo, MD [facility] Plastic & Reconstructive Surgery [phone], pin [phone]

## 2017-06-28 NOTE — Anesthesia Preprocedure Evaluation (Signed)
Anesthesia Evaluation  Patient identified by MRN, date of birth, ID band Patient awake    Reviewed: Allergy & Precautions, NPO status , Patient's Chart, lab work & pertinent test results  History of Anesthesia Complications (+) PONV, Family history of anesthesia reaction and history of anesthetic complications  Airway Mallampati: II  TM Distance: >3 FB Neck ROM: Full    Dental no notable dental hx. (+) Teeth Intact   Pulmonary shortness of breath and with exertion, asthma , sleep apnea and Continuous Positive Airway Pressure Ventilation , former smoker,    Pulmonary exam normal breath sounds clear to auscultation       Cardiovascular negative cardio ROS Normal cardiovascular exam Rhythm:Regular Rate:Normal     Neuro/Psych  Headaches, PSYCHIATRIC DISORDERS Anxiety Depression  Neuromuscular disease    GI/Hepatic Neg liver ROS, GERD  Medicated and Controlled,  Endo/Other  Hypothyroidism Left breast Ca  Renal/GU Renal disease  negative genitourinary   Musculoskeletal  (+) Arthritis , Osteoarthritis,    Abdominal   Peds  Hematology negative hematology ROS (+)   Anesthesia Other Findings   Reproductive/Obstetrics                             Anesthesia Physical Anesthesia Plan  ASA: II  Anesthesia Plan: General   Post-op Pain Management:    Induction: Intravenous  PONV Risk Score and Plan: 4 or greater and Scopolamine patch - Pre-op, Midazolam, Dexamethasone, Ondansetron and Treatment may vary due to age or medical condition  Airway Management Planned: Oral ETT  Additional Equipment:   Intra-op Plan:   Post-operative Plan: Extubation in OR  Informed Consent: I have reviewed the patients History and Physical, chart, labs and discussed the procedure including the risks, benefits and alternatives for the proposed anesthesia with the patient or authorized representative who has indicated  his/her understanding and acceptance.   Dental advisory given  Plan Discussed with: Anesthesiologist, CRNA and Surgeon  Anesthesia Plan Comments:         Anesthesia Quick Evaluation

## 2017-06-30 ENCOUNTER — Encounter (HOSPITAL_BASED_OUTPATIENT_CLINIC_OR_DEPARTMENT_OTHER): Payer: Self-pay | Admitting: Plastic Surgery

## 2017-07-02 NOTE — Telephone Encounter (Signed)
Opened by error.

## 2017-07-14 ENCOUNTER — Other Ambulatory Visit: Payer: Self-pay | Admitting: Hematology

## 2017-08-09 ENCOUNTER — Other Ambulatory Visit: Payer: Self-pay | Admitting: Hematology

## 2017-08-09 DIAGNOSIS — C50811 Malignant neoplasm of overlapping sites of right female breast: Secondary | ICD-10-CM

## 2017-08-09 DIAGNOSIS — Z170 Estrogen receptor positive status [ER+]: Principal | ICD-10-CM

## 2017-08-10 ENCOUNTER — Telehealth: Payer: Self-pay

## 2017-08-10 NOTE — Telephone Encounter (Signed)
OK great, thanks.  [name]

## 2017-08-10 NOTE — Telephone Encounter (Signed)
Spoke to patient to clarify that she started the Anastrozole after surgery.  She stopped taking the exemestane and is taking the Anastrozole.

## 2017-08-11 ENCOUNTER — Other Ambulatory Visit: Payer: Self-pay | Admitting: Otolaryngology

## 2017-08-12 ENCOUNTER — Other Ambulatory Visit: Payer: Self-pay | Admitting: Otolaryngology

## 2017-08-12 DIAGNOSIS — E041 Nontoxic single thyroid nodule: Secondary | ICD-10-CM

## 2017-08-24 ENCOUNTER — Ambulatory Visit
Admission: RE | Admit: 2017-08-24 | Discharge: 2017-08-24 | Disposition: A | Discharge status: Home / Self Care | Payer: BLUE CROSS/BLUE SHIELD | Source: Ambulatory Visit | Attending: Otolaryngology | Admitting: Otolaryngology

## 2017-08-24 DIAGNOSIS — E041 Nontoxic single thyroid nodule: Secondary | ICD-10-CM | POA: Diagnosis not present

## 2017-10-13 ENCOUNTER — Other Ambulatory Visit: Payer: Self-pay | Admitting: Dermatology

## 2017-10-13 DIAGNOSIS — B009 Herpesviral infection, unspecified: Secondary | ICD-10-CM | POA: Diagnosis not present

## 2017-10-13 DIAGNOSIS — D229 Melanocytic nevi, unspecified: Secondary | ICD-10-CM | POA: Diagnosis not present

## 2017-10-13 DIAGNOSIS — D485 Neoplasm of uncertain behavior of skin: Secondary | ICD-10-CM | POA: Diagnosis not present

## 2017-10-18 ENCOUNTER — Inpatient Hospital Stay: Payer: BLUE CROSS/BLUE SHIELD

## 2017-10-18 ENCOUNTER — Encounter: Payer: Self-pay | Admitting: Hematology

## 2017-10-18 ENCOUNTER — Telehealth: Payer: Self-pay | Admitting: Hematology

## 2017-10-18 ENCOUNTER — Inpatient Hospital Stay: Payer: BLUE CROSS/BLUE SHIELD | Attending: Hematology | Admitting: Hematology

## 2017-10-18 VITALS — BP 132/85 | HR 87 | Temp 98.5°F | Resp 18 | Ht 65.0 in | Wt 176.0 lb

## 2017-10-18 DIAGNOSIS — K5900 Constipation, unspecified: Secondary | ICD-10-CM | POA: Diagnosis not present

## 2017-10-18 DIAGNOSIS — M549 Dorsalgia, unspecified: Secondary | ICD-10-CM

## 2017-10-18 DIAGNOSIS — Z800 Family history of malignant neoplasm of digestive organs: Secondary | ICD-10-CM

## 2017-10-18 DIAGNOSIS — Z923 Personal history of irradiation: Secondary | ICD-10-CM | POA: Diagnosis not present

## 2017-10-18 DIAGNOSIS — F418 Other specified anxiety disorders: Secondary | ICD-10-CM | POA: Diagnosis not present

## 2017-10-18 DIAGNOSIS — C50811 Malignant neoplasm of overlapping sites of right female breast: Secondary | ICD-10-CM

## 2017-10-18 DIAGNOSIS — Z90710 Acquired absence of both cervix and uterus: Secondary | ICD-10-CM

## 2017-10-18 DIAGNOSIS — Z8041 Family history of malignant neoplasm of ovary: Secondary | ICD-10-CM | POA: Diagnosis not present

## 2017-10-18 DIAGNOSIS — R232 Flushing: Secondary | ICD-10-CM

## 2017-10-18 DIAGNOSIS — Z79811 Long term (current) use of aromatase inhibitors: Secondary | ICD-10-CM

## 2017-10-18 DIAGNOSIS — Z832 Family history of diseases of the blood and blood-forming organs and certain disorders involving the immune mechanism: Secondary | ICD-10-CM

## 2017-10-18 DIAGNOSIS — E039 Hypothyroidism, unspecified: Secondary | ICD-10-CM

## 2017-10-18 DIAGNOSIS — Z79899 Other long term (current) drug therapy: Secondary | ICD-10-CM | POA: Diagnosis not present

## 2017-10-18 DIAGNOSIS — K219 Gastro-esophageal reflux disease without esophagitis: Secondary | ICD-10-CM

## 2017-10-18 DIAGNOSIS — Z87891 Personal history of nicotine dependence: Secondary | ICD-10-CM | POA: Diagnosis not present

## 2017-10-18 DIAGNOSIS — Z170 Estrogen receptor positive status [ER+]: Principal | ICD-10-CM

## 2017-10-18 DIAGNOSIS — F5104 Psychophysiologic insomnia: Secondary | ICD-10-CM | POA: Diagnosis not present

## 2017-10-18 DIAGNOSIS — Z803 Family history of malignant neoplasm of breast: Secondary | ICD-10-CM

## 2017-10-18 DIAGNOSIS — Z9013 Acquired absence of bilateral breasts and nipples: Secondary | ICD-10-CM | POA: Diagnosis not present

## 2017-10-18 DIAGNOSIS — G8929 Other chronic pain: Secondary | ICD-10-CM | POA: Diagnosis not present

## 2017-10-18 LAB — CBC WITH DIFFERENTIAL/PLATELET
Basophils Absolute: 0.1 10*3/uL (ref 0.0–0.1)
Basophils Relative: 1 %
EOS ABS: 0.2 10*3/uL (ref 0.0–0.5)
Eosinophils Relative: 3 %
HCT: 38.7 % (ref 34.8–46.6)
HEMOGLOBIN: 12.5 g/dL (ref 11.6–15.9)
LYMPHS ABS: 1.9 10*3/uL (ref 0.9–3.3)
Lymphocytes Relative: 33 %
MCH: 27.9 pg (ref 25.1–34.0)
MCHC: 32.3 g/dL (ref 31.5–36.0)
MCV: 86.4 fL (ref 79.5–101.0)
MONO ABS: 0.6 10*3/uL (ref 0.1–0.9)
MONOS PCT: 11 %
NEUTROS PCT: 52 %
Neutro Abs: 3 10*3/uL (ref 1.5–6.5)
Platelets: 242 10*3/uL (ref 145–400)
RBC: 4.48 MIL/uL (ref 3.70–5.45)
RDW: 17 % — AB (ref 11.2–14.5)
WBC: 5.7 10*3/uL (ref 3.9–10.3)

## 2017-10-18 LAB — COMPREHENSIVE METABOLIC PANEL
ALBUMIN: 4 g/dL (ref 3.5–5.0)
ALT: 25 U/L (ref 0–44)
AST: 22 U/L (ref 15–41)
Alkaline Phosphatase: 117 U/L (ref 38–126)
Anion gap: 9 (ref 5–15)
BUN: 17 mg/dL (ref 6–20)
CHLORIDE: 106 mmol/L (ref 98–111)
CO2: 26 mmol/L (ref 22–32)
Calcium: 9.2 mg/dL (ref 8.9–10.3)
Creatinine, Ser: 0.84 mg/dL (ref 0.44–1.00)
GFR calc Af Amer: >60 mL/min (ref >60)
GFR calc non Af Amer: >60 mL/min (ref >60)
GLUCOSE: 110 mg/dL — AB (ref 70–99)
Potassium: 3.8 mmol/L (ref 3.5–5.1)
Sodium: 141 mmol/L (ref 135–145)
Total Bilirubin: 0.3 mg/dL (ref 0.3–1.2)
Total Protein: 6.7 g/dL (ref 6.5–8.1)

## 2017-10-18 MED ORDER — ANASTROZOLE 1 MG PO TABS: 1.0000 mg | ORAL_TABLET | Freq: Every day | ORAL | 1 refills | Status: DC

## 2017-10-18 NOTE — Telephone Encounter (Signed)
Appts scheduled AVS/Calendar printed per 8/26 los °

## 2017-10-18 NOTE — Progress Notes (Signed)
[facility]  [phone] [phone]  Clinic Follow Up Note   Patient Care Team: Adria Dill Leonia Reader, FNP as PCP - General (Internal Medicine) A[place], Laurice Record, MD (Inactive) (Orthopedic Surgery) Rutherford Guys, MD (Ophthalmology) Lavonna Monarch, MD (Dermatology) Truitt Merle, MD as Consulting Physician (Hematology) Rolm Bookbinder, MD as Consulting Physician (General Surgery) Kyung Rudd, MD as Consulting Physician (Radiation Oncology) Delice Bison, Charlestine Massed, NP as Nurse Practitioner (Hematology and Oncology)  Date of Service:  10/18/2017   CHIEF COMPLAINTS: F/u for Right breast cancer  Oncology History   Cancer Staging Malignant neoplasm of overlapping sites of right breast in female, estrogen receptor positive ([address]) Staging form: Breast, AJCC 8th Edition - Clinical stage from 03/23/2016: Stage IIA (cT3, cN1, cM0, G2, ER: Positive, PR: Positive, HER2: Negative) - Signed by Truitt Merle, MD on 03/31/2016 - Pathologic stage from 05/19/2016: Stage IA (pT2(m), pN1a, cM0, G1, ER: Positive, PR: Positive, HER2: Negative) - Signed by Truitt Merle, MD on 06/18/2016       Malignant neoplasm of overlapping sites of right breast in female, estrogen receptor positive ([place])   03/17/2016 Mammogram    B/l diagnostic mammogram and righ US showed a 3.6cm irregular mass in the right breast 11:00 position, posterior depth, there is a enlarged lymph node in the right axilla is highly suspicious for malignancy. additional 7 mm oval mass in the right breast lower outer quadrant is suspicious for malignancy.    03/23/2016 Initial Biopsy    Right breast 9:30 position biopsy showed invasive ductal carcinoma, grade 1. 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    03/23/2016 Receptors her2    Both breast and node biopsy tumor ER 100% positive, PR 70-95% positive, HER-2 negative, Ki-67 40%    03/23/2016 Initial Diagnosis    Malignant neoplasm of  upper-outer quadrant of right breast in female, estrogen receptor positive ([name])    03/25/2016 Initial Biopsy    Right breast 11:00 position core needle biopsy showed invasive duct carcinoma, grade 2.     03/25/2016 Receptors her2    ER 95% positive, PR 90% positive, HER-2 negative, Ki-67 15%    03/25/2016 Miscellaneous    Mammaprint showed low risk type A with index +0.105    03/30/2016 Imaging    Bilateral breast MRI with and without contrast showed a large lobulated enhancing mass within the upper-outer and lower outer right breast with surrounding nodularity, measuring 6.1 x 4.4 x 5.6 cm. Multiple critically sick and right axillary lymph nodes are demonstrated measuring up to 1.5 cm.     04/01/2016 -  Anti-estrogen oral therapy    Exemestane 25 mg daily, plan for 7 years. Switched to Anastrozole 38m in 06/2017 due to joint pain and hot flashes.    05/19/2016 Surgery    Bilateral mastectomy and right axillary regional lymph node resection.    05/19/2016 Pathology Results    -Right axillary regional lymph node resection revealed metastatic carcinoma in 2/7 lymph nodes. -Left simple mastectomy revealed lobular neoplasia and fibrocystic changes with adenosis and calcifications. 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 -Skin of the right mastectomy flap was benign. -mpT2, pN1a    06/25/2016 - 08/11/2016 Radiation Therapy    Site/dose:    1. 4 field Right breast was treated to 50.4 Gy in 25 fractions at 1.8 Gy per fraction. 2. The  Right breast was boosted to 10 Gy in 5 fractions at 2 Gy per fraction.    04/15/2017 Genetic Testing    The patient had genetic testing due to a personal history of breast cancer and family history of breast, ovarian, and colon cancer. The Common Hereditary  Cancer Panel was ordered.  The Common Hereditary Cancer Panel offered by [facility] includes sequencing and/or deletion duplication testing of the following 47 genes: APC, ATM, AXIN2, BARD1, BMPR1A, BRCA1, BRCA2, BRIP1, CDH1, CDKN2A (p14ARF), CDKN2A (p16INK4a), CKD4, CHEK2, CTNNA1, DICER1, EPCAM (Deletion/duplication testing only), GREM1 (promoter region deletion/duplication testing only), KIT, MEN1, MLH1, MSH2, MSH3, MSH6, MUTYH, NBN, NF1, NHTL1, PALB2, PDGFRA, PMS2, POLD1, POLE, PTEN, RAD50, RAD51C, RAD51D, SDHB, SDHC, SDHD, SMAD4, SMARCA4. STK11, TP53, TSC1, TSC2, and VHL.  The following genes were evaluated for sequence changes only: SDHA and HOXB13 c.251G>A variant only.  Results: Negative, no pathogenic variants identified. The date of this test report is 04/15/2017    06/28/2017 Surgery    COMPLEX REVISION OF BACK SCAR and LEFT BREAST RECONSTRUCTION WITH SILICONE IMPLANT EXCHANGE AND ACELLULARDERMIS TO LEFT CHEST by Dr. Iran Planas 06/28/17     HISTORY OF PRESENTING ILLNESS (04/01/2016):  [name] 57 y.o. female is here because of recent diagnosis of right breast carcinoma with metastatic adenopathy. She is accompanied by her husband and sister to our multidisciplinary breast clinic today.  She had right breast cystic lesion in the past and was aspirated/biopsied, a total of 3 times in the past, she has beeing doing annual mammogram but did not have it in 2017.   She felt a right breast lump one months ago, and was seen by her PCP. She also report sighicant pain at the breat mass site, persistent but fluctuates,  sometime10/10, no skin change or nipple discharge.  She has chronic back pain, she takes vocodin as needed, not very often. she also report anxiety and anorexia since the cancer diagnosis.   Biopsy of right breast at 9:30 o'clock position on 03/23/16 showed invasive mammary carcinoma. Biopsy of the right breast at the 11:00 o'clock position showed fibrocystic change and dense fibrosis  with no malignancy identified. Right lymph node was positive for metastatic mammary carcinoma. Pathology revealed carcinoma appearing grade 1, ER/PR positive, HER-2 negative, Ki-67 40%. E-cadherin is positive, consistent with a ductal phenotype.  Repeat biopsy of the right breast at the 11:00 o'clock position on 03/25/16 showed invasive mammary carcinoma. Pathology revealed carcinoma appearing grade 2. An E-Cadherin stain was performed revealing that some tumor cells (approximately 10%) are positive for E-Cadherin, supporting at least a focal ductal phenotype. The remainder of the tumor cells appear negative for E-Cadherin.  Bilateral breast MRI on 03/29/16 showed large irregular masslike area of enhancement within the upper outer and lower outer right breast compatible with biopsy-proven malignancy. Also seen were enlarged right axillary lymph nodes compatible with metastatic adenopathy.  She had hysterectomy due to a neuralgia in 2011, has had menopause symptoms (hot flashes and mood swing) for over a year now, overall tolerable. But she has been quite anxious since her cancer diagnosis, does not sleep well. Her appetite has also dropped since her cancer diagnosis.  GYN HISTORY  Menarchal: 9 LMP: 11/06/2009 (hysterectomy)  Contraceptive: yes, <5 years  HRT: non G2P2: two daughters age of 21 and 41, no breast feeding   Current Therapy: Exemestane 25 mg started on 04/01/16. Switched to Anastrozole 80m in 06/2017.    Interval History:  KSIERA BEYERSDORFreturns for follow up post breast  reconstruction surgery. She presents to the clinic today by herself. She is doing well and tolerating anastrozole well except for hot flashes and dry mouth. She tries to drink more water and stay hydrated. She states that her hot flashes are bad and severe. She is on Effexor, but still has symptoms. Her joint stiffness is improving. She is also constipated, and states that her constipation is bad and she stopped her iron  pills due to this. She tried gummy vitamins that helped her constipation a little. She also uses laxatives as needed. She reports having dry hair and mild hair loss, which she relates to her thyroid.   MEDICAL HISTORY:  Past Medical History:  Diagnosis Date  . Anxiety   . Anxiety disorder   . Asthma    h/o asthma as a child  . Breast cancer ([place])   . Cancer ([place]) 04/2016   right breast cancer  . Cholelithiasis   . Chronic kidney disease    obstruction of R kidney, ( not a stone) - currently resolved   . Depression   . Diverticulosis   . DJD (degenerative joint disease)    hands & back   . Dyspnea    resolved since she stopped smoking   . Epigastric abdominal pain   . Esophageal stricture   . Family history of adverse reaction to anesthesia    daughter has N&V, takes long time to wake up   . Family history of colon cancer   . Family history of ovarian cancer   . Family hx of colon cancer   . 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 Ganglion cyst   . GERD (gastroesophageal reflux disease)   . Headache    low grade currently , family history of migraines   . Hemorrhoid   . History of radiation therapy 05/03 - 08/11/2016   1. 4 field Right breast was treated to 50.4 Gy in 25 fractions at 1.8 Gy per fraction. 2. The Right breast was boosted to 10 Gy in 5 fractions at 2 Gy per fraction.  . Hypothyroidism   . Plantar fasciitis, bilateral   . PONV (postoperative nausea and vomiting)    gets anxious with the mask on her face, also remarks that the scop. patch has helped in the past       SURGICAL HISTORY: Past Surgical History:  Procedure Laterality Date  . ABDOMINAL HYSTERECTOMY    . BREAST RECONSTRUCTION WITH PLACEMENT OF TISSUE EXPANDER AND FLEX HD (ACELLULAR HYDRATED DERMIS) Bilateral 05/19/2016   Procedure: BREAST RECONSTRUCTION WITH PLACEMENT OF TISSUE EXPANDER AND ALLODERM PLACEMENT;  Surgeon: Irene Limbo, MD;  Location: [facility];  Service:  Plastics;  Laterality: Bilateral;  . BREAST RECONSTRUCTION WITH PLACEMENT OF TISSUE EXPANDER AND FLEX HD (ACELLULAR HYDRATED DERMIS) Left 06/28/2017   Procedure: LEFT BREAST RECONSTRUCTION WITH SILICONE IMPLANT EXCHANGE AND ACELLULARDERMIS TO LEFT CHEST;  Surgeon: Irene Limbo, MD;  Location: [facility];  Service: Plastics;  Laterality: Left;  . CESAREAN SECTION  1988  . CHOLECYSTECTOMY    . HEMORRHOID SURGERY    . LAPAROSCOPIC BILATERAL SALPINGECTOMY N/A 10/26/2012   Procedure: operative laparoscopy with lysis of adhesions;  Surgeon: Thornell Sartorius, MD;  Location: [facility];  Service: Gynecology;  Laterality: N/A;  . MASTECTOMY Bilateral   . MASTECTOMY WITH RADIOACTIVE SEED GUIDED EXCISION AND AXILLARY SENTINEL LYMPH NODE BIOPSY Bilateral 05/19/2016   Procedure: RIGHT SKIN SPARING MASTECTOMY WITH RIGHT RADIOACTIVE SEED TARGETED DISSECTION AND RIGHT SENTINEL LYMPH NODE BIOPSY, LEFT  PROPHYLACTIC SKIN SPARING MASTECTOMY;  Surgeon: Rolm Bookbinder, MD;  Location: [facility];  Service: General;  Laterality: Bilateral;  . REMOVAL OF BILATERAL TISSUE EXPANDERS WITH PLACEMENT OF BILATERAL BREAST IMPLANTS Bilateral 01/29/2017   Procedure: REMOVAL OF BILATERAL TISSUE EXPANDERS WITH PLACEMENT OF BILATERAL SILICONE BREAST IMPLANTS, ALLODERM TO LEFT BREAST RECONSTRUCTION;  RIGHT LATISSUMUS FLAP;  Surgeon: Irene Limbo, MD;  Location: [facility];  Service: Plastics;  Laterality: Bilateral;  Requesting RNFA  . SCAR REVISION Right 06/28/2017   Procedure: COMPLEX REVISION OF BACK SCAR;  Surgeon: Irene Limbo, MD;  Location: [facility];  Service: Plastics;  Laterality: Right;  . TONSILLECTOMY    . TUBAL LIGATION    . urologic surgery for ureteropelvic junction obstruction      SOCIAL HISTORY: Social History   Socioeconomic History  . Marital status: Married    Spouse name: Not on file  . Number of children: 2  . Years of education: 16  . Highest education level:  Not on file  Occupational History  . Occupation: Passenger transport manager, Games developer: [employer]  Social Needs  . Financial resource strain: Not on file  . Food insecurity:    Worry: Not on file    Inability: Not on file  . Transportation needs:    Medical: Not on file    Non-medical: Not on file  Tobacco Use  . Smoking status: Former Smoker    Packs/day: 0.50    Years: 15.00    Pack years: 7.50    Types: Cigarettes    Last attempt to quit: 04/08/2016    Years since quitting: 1.5  . Smokeless tobacco: Never Used  Substance and Sexual Activity  . Alcohol use: Yes    Alcohol/week: 14.0 standard drinks    Types: 12 Cans of beer, 2 Standard drinks or equivalent per week    Comment: moderate, daily   . Drug use: No  . Sexual activity: Yes    Partners: Male    Birth control/protection: Surgical  Lifestyle  . Physical activity:    Days per week: Not on file    Minutes per session: Not on file  . Stress: Not on file  Relationships  . Social connections:    Talks on phone: Not on file    Gets together: Not on file    Attends religious service: Not on file    Active member of club or organization: Not on file    Attends meetings of clubs or organizations: Not on file    Relationship status: Not on file  . Intimate partner violence:    Fear of current or ex partner: Not on file    Emotionally abused: Not on file    Physically abused: Not on file    Forced sexual activity: Not on file  Other Topics Concern  . Not on file  Social History Narrative   HSG, some community college. .Married -'81.   2 daughters  '83, '88 daughter  with bipolar dz. Has had behavior issues. 1 granddaughter '05 living with her. Occupation:Bank worker. Marriage in good. No history of abuse.                   FAMILY HISTORY: Family History  Problem Relation Age of Onset  . COPD Mother   . Ovarian cancer Mother 59       surgery, no chemo  . Kidney disease Mother   . Diabetes Father   .  Heart  disease Father   . Hypertension Father   . Colon cancer Father 81  . Other Father        Amyloidosis  . Cancer Father        2nd cancer later in life, type unk  . Hypertension Sister   . Depression Sister   . Arthritis Sister   . Von Willebrand disease Sister   . Breast cancer Paternal Grandmother        pt doesn't remember, but her sister says this grandmother had br cancer  . Goiter Paternal Grandmother   . Penile cancer Paternal Uncle 43  . Rheum arthritis Maternal Grandmother     ALLERGIES:  is allergic to adhesive [tape] and promethazine hcl.  MEDICATIONS:  Current Outpatient Medications  Medication Sig Dispense Refill  . acetaminophen (TYLENOL) 500 MG tablet Take 1,000 mg by mouth every 6 (six) hours as needed.    . ALPRAZolam (XANAX) 1 MG tablet Take 1 tablet (1 mg total) by mouth 3 (three) times daily. 90 tablet 0  . anastrozole (ARIMIDEX) 1 MG tablet Take 1 tablet (1 mg total) by mouth daily. 90 tablet 1  . celecoxib (CELEBREX) 200 MG capsule Take 1 capsule (200 mg total) by mouth daily. 30 capsule 2  . DENTA 5000 PLUS 1.1 % CREA dental cream     . docusate sodium (COLACE) 100 MG capsule Take 100 mg by mouth daily as needed for mild constipation.    Marland Kitchen HYDROcodone-acetaminophen (NORCO/VICODIN) 5-325 MG tablet Take 1-2 tablets by mouth every 4 (four) hours as needed for moderate pain. 40 tablet 0  . levothyroxine (SYNTHROID, LEVOTHROID) 50 MCG tablet Take 50 mcg by mouth daily before breakfast.    . methocarbamol (ROBAXIN) 500 MG tablet Take 1 tablet (500 mg total) by mouth every 8 (eight) hours as needed for muscle spasms. 30 tablet 0  . omeprazole (PRILOSEC) 40 MG capsule TAKE ONE CAPSULE BY MOUTH EVERY DAY 30 capsule 4  . venlafaxine XR (EFFEXOR-XR) 150 MG 24 hr capsule Take 1 capsule (150 mg total) by mouth daily with breakfast. 90 capsule 1  . venlafaxine XR (EFFEXOR-XR) 75 MG 24 hr capsule TAKE 1 CAPSULE (75 MG TOTAL) BY MOUTH DAILY WITH BREAKFAST. 90 capsule 2    No current facility-administered medications for this visit.     REVIEW OF SYSTEMS:   Constitutional: Denies fevers, chills or abnormal night sweats; (+) hot flashes, severe Eyes: Denies blurriness of vision, double vision or watery eyes Ears, nose, mouth, throat, and face: Denies mucositis or sore throat (+) dry mouth Respiratory: Denies wheezes  Cardiovascular: Denies palpitation, chest discomfort or lower extremity swelling Gastrointestinal:  Denies nausea, heartburn (+) constipation  Skin: Denies abnormal skin rashes (+) dry hair Lymphatics: Denies new lymphadenopathy or easy bruising Neurological:Denies numbness, tingling or new weaknesses Behavioral/Psych: Mood is stable, no new changes  MSK: (+) joint stiffness in her hands and back pain, improving All other systems were reviewed with the patient and are negative.  PHYSICAL EXAMINATION: ECOG PERFORMANCE STATUS: 0 - Asymptomatic  Vitals:   10/18/17 1523  BP: 132/85  Pulse: 87  Resp: 18  Temp: 98.5 F (36.9 C)  SpO2: 98%   Filed Weights   10/18/17 1523  Weight: 176 lb (79.8 kg)     GENERAL:alert, no distress and comfortable SKIN: skin color, texture, turgor are normal, no rashes or significant lesions EYES: normal, conjunctiva are pink and non-injected, sclera clear OROPHARYNX:no exudate, no erythema and lips, buccal mucosa, and tongue normal  NECK:  supple, thyroid normal size, non-tender, without nodularity LYMPH:  no palpable lymphadenopathy in the cervical, axillary or inguinal LUNGS: clear to auscultation and percussion with normal breathing effort HEART: regular rate & rhythm and no murmurs and no lower extremity edema ABDOMEN:abdomen soft, non-tender and normal bowel sounds Musculoskeletal:no cyanosis of digits and no clubbing  PSYCH: alert & oriented x 3 with fluent speech NEURO: no focal motor/sensory deficits Breasts: Status post bilateral mastectomy and breast implant placement. Incisions healed well.  No erythema or discharge. Otherwise no palpable mass or adenopathy.   LABORATORY DATA:  I have reviewed the data as listed CBC Latest Ref Rng & Units 10/18/2017 06/21/2017 02/19/2017  WBC 3.9 - 10.3 K/uL 5.7 5.8 4.9  Hemoglobin 11.6 - 15.9 g/dL 12.5 11.9 11.2(L)  Hematocrit 34.8 - 46.6 % 38.7 38.9 34.9  Platelets 145 - 400 K/uL 242 254 302    CMP Latest Ref Rng & Units 06/21/2017 02/19/2017 01/26/2017  Glucose 70 - 140 mg/dL 99 99 98  BUN 7 - 26 mg/dL 19 16.4 12  Creatinine 0.60 - 1.10 mg/dL 0.89 0.9 0.97  Sodium 136 - 145 mmol/L 139 141 140  Potassium 3.5 - 5.1 mmol/L 3.8 4.0 4.1  Chloride 98 - 109 mmol/L 104 - 102  CO2 22 - 29 mmol/L '25 26 28  ' Calcium 8.4 - 10.4 mg/dL 9.4 8.8 9.3  Total Protein 6.4 - 8.3 g/dL 6.8 6.2(L) -  Total Bilirubin 0.2 - 1.2 mg/dL 0.3 0.39 -  Alkaline Phos 40 - 150 U/L 101 88 -  AST 5 - 34 U/L 23 22 -  ALT 0 - 55 U/L 27 23 -   PATHOLOGY  Diagnosis (05/19/16) 1. Lymph nodes, regional resection, Right axillary - METASTATIC CARCINOMA IN 2 OF 7 LYMPH NODES (2/7). - SEE COMMENT. 2. Breast, simple mastectomy, Left - LOBULAR NEOPLASIA (ATYPICAL LOBULAR HYPERPLASIA). - FIBROCYSTIC CHANGES WITH ADENOSIS AND CALCIFICATIONS. 3. Breast, simple mastectomy, Right - INVASIVE MIXED LOBULAR-DUCTAL CARCINOMA GRADE I/III, MULTIPLE FOCI, THE LARGEST SPANS 3.0 CM. - LOBULAR NEOPLASIA (ATYPICAL LOBULAR HYPERPLASIA). - ATYPICAL DUCTAL HYPERPLASIA. - LYMPHOVASCULAR INVASION IS IDENTIFIED. - THE SURGICAL RESECTION MARGINS ARE NEGATIVE FOR CARCINOMA. - SEE ONCOLOGY TABLE BELOW. 4. Skin , Right Mastectomy Flap - BENIGN SKIN. Microscopic Comment 1. Immunohistochemical stains performed on multiple blocks on part 1 highlight the presence of carcinoma. 3. BREAST Procedure: Simple mastectomy and right axillary lymph node resection. Laterality: Carcinoma is on the right. Tumor Size: Multiple (at least 6) foci spanning from microscopic foci to a lesion grossly measuring 3.0  cm. Histologic Type: Mixed lobular-tubular. Grade: I Tubular Differentiation: 1-2 Nuclear Pleomorphism: 2 Mitotic Count: 1 Ductal Carcinoma in Situ (DCIS): Not identified, however, atypical lobular hyperplasia and atypical ductal hyperplasia are identified. Regional Lymph Nodes: Number of Lymph Nodes Examined: 7 1 of 4 FINAL for NAKEIA, CALVI ([id number]) Microscopic Comment(continued) Number of Sentinel Lymph Nodes Examined: Unknown. Lymph Nodes with Macrometastases: 2 Lymph Nodes with Micrometastases: 0 Lymph Nodes with Isolated Tumor Cells: 0 Margins: Greater than 0.2 cm to all margins Extent of Tumor: Confined to breast parenchyma. Breast Prognostic Profile # [id number] Estrogen Receptor: Positive, strong. Progesterone Receptor: Positive, strong. Her2: No amplification was detected. Ki-67: Ranges from 15% - 40%. TNM: mpT2, pN1a COMMENTS: There are multiple biopsy clips identified in the right mastectomy specimen. Grossly, there are at least four foci of similar appearing invasive lobular-tubular carcinoma present, the largest spans 3.0 cm. In addition, there is similar appearing invasive carcinoma present in random tissue submitted from the  upper inner quadrant and the upper outer quadrant. The phenotype diagnosed above is supported by patchy but strong staining for E-cadherin in some places and negative for E-cadherin in other places. Most (approximately 80%) of the carcinoma appears negative for e-cadherin, so the phenotype is predominantly lobular. No additional prognostic profiles will be performed, unless requested by a clinician. (JBK:gt, 05/21/16) Enid Cutter MD Pathologist, Electronic Signature (Case signed 05/21/2016)  Mammaprint 03/25/16   Diagnosis 03/25/2016 Breast, right, needle core biopsy, 11:00 o'clock - INVASIVE MAMMARY CARCINOMA. - SEE COMMENT. Microscopic Comment The carcinoma appears grade 2. An E-cadherin stain will be performed and the  results reported separately. A breast prognostic profile will be performed and the results reported separately. The results were called to [facility] on 03/26/16. (JBK:gt, 03/26/16) An E-Cadherin stain was performed revealing that some tumor cells (approximately 10%) are positive for E-Cadherin, supporting at least a focal ductal phenotype. The remainder of the tumor cells appear negative for E-Cadherin. (JBK:kh 03/27/16)  Results: IMMUNOHISTOCHEMICAL AND MORPHOMETRIC ANALYSIS PERFORMED MANUALLY Estrogen Receptor: 95%, POSITIVE, STRONG STAINING INTENSITY Progesterone Receptor: 90%, POSITIVE, STRONG STAINING INTENSITY Proliferation Marker Ki67: 15%  Results: HER2 - NEGATIVE RATIO OF HER2/CEP17 SIGNALS 1.12 AVERAGE HER2 COPY NUMBER PER CELL 1.90  Diagnosis 03/23/2016 1. Breast, right, needle core biopsy, 9:30 o'clock - INVASIVE MAMMARY CARCINOMA, SEE COMMENT. 2. Breast, right, needle core biopsy, 11:00 o'clock - FIBROCYSTIC CHANGE AND DENSE FIBROSIS. - NO MALIGNANCY IDENTIFIED. 3. Lymph node, needle/core biopsy, right - METASTATIC MAMMARY CARCINOMA. Microscopic Comment 1. While grading is best performed on the resection specimen, the carcinoma appears grade 1. While a ductal phenotype is favored, an e-cadherin will be performed and reported in an add-on. Prognostic markers will be ordered. Dr. Saralyn Pilar has reviewed all 3 parts of the case. The case was called to Dr. Marcelo Baldy on 03/24/2016.  1. PROGNOSTIC INDICATORS Results: IMMUNOHISTOCHEMICAL AND MORPHOMETRIC ANALYSIS PERFORMED MANUALLY Estrogen Receptor: 100%, POSITIVE, STRONG STAINING INTENSITY Progesterone Receptor: 70%, POSITIVE, STRONG STAINING INTENSITY Proliferation Marker Ki67: 40%  Results: HER2 - NEGATIVE RATIO OF HER2/CEP17 SIGNALS 1.17 AVERAGE HER2 COPY NUMBER PER CELL 2.10  3. PROGNOSTIC INDICATORS Results: IMMUNOHISTOCHEMICAL AND MORPHOMETRIC ANALYSIS PERFORMED MANUALLY Estrogen Receptor: 100%, POSITIVE,  STRONG STAINING INTENSITY Progesterone Receptor: 95%, POSITIVE, STRONG STAINING INTENSITY Proliferation Marker Ki67: 40% HER2 - NEGATIVE  1. E-cadherin is positive, consistent with a ductal phenotype.  RADIOGRAPHIC STUDIES: I have personally reviewed the radiological images as listed and agreed with the findings in the report. No results found. MR Breast Bilateral w wo contrast 03/29/16 IMPRESSION: Large irregular masslike area of enhancement within the upper outer and lower outer right breast compatible with biopsy-proven malignancy. Enlarged right axillary lymph nodes compatible with metastatic adenopathy.  ASSESSMENT & PLAN: 57 y.o.  postmenopausal woman, presented with a palpable right breast mass.  1.  Malignant new overlapping sites of right breast, mpT2 pN1a, stage IA, G1,  ER positive, PR positive, HER-2 negative, mammaprint low risk luminal type A --I previously discussed her surgical path result in details.  -She had a complete surgical resection with negative margins, 2 out of 17 lymph nodes were positive. -the mammaprint result was reviewed with her in detail previously. 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  -She started exemestane before surgery, has been tolerating well without significant side effects, we'll continue, plan for 7-10 years. -We previously discussed the breast cancer surveillance after her surgery. She will continue annual screening  mammogram, self exam, and a routine office visit with lab and exam with [place]. -The patient saw Dr. Iran Planas of plastic surgery on 06/15/16 who discussed implant exchange in late December/approximately 6 months from the end of radiation. -The patient proceeded with radiation to the right chest wall and right axilla starting 06/25/16. She completed RT 08/11/16 has had ongoing hot flashes since. -due to her worsening joint stiffness and pain in her hands, back  pain and hot flashes, I switched Exemestane to Anastrozole in 06/2017.   -She tolerates anastrozole slightly better, but still complains of bad hot flashes, dry mouth, and constipation. She is on Effexor, but still has significant hot flash, although manageable.  Due to her node positive disease, I encouraged her to continue antiestrogen therapy.  I will continue Anastrozole and monitor her symptoms.  She is agreeable. -F/u in 4 months  -Has had a bilateral mastectomy, no need mammogram.  2. Insomnia  -She has chronic insomnia, previous took Ambien before. Currently having difficulty sleeping, was attempting to take Benadryl for this with some improvement. -I previously encouraged her to discuss with her primary care physician.  I cautioned against long term usage of ambien and advised her that this could be hard to come off of later down the road.   3. Hot flashes -Continue taking Effexor 120m daily with some improvement; this was increased when she called our office on (09/08/16). Will continue  -She still has persistent hot flashes, I previously increased her Effexor dose to 225 mg daily on 06/18/17. This is the highest dose   4. Easy bruising  -she reports history of easy bruising, no history of internal bleeding -her sister was diagnosed with VWD recently -VWD panel was still pending today. I will call with results.    Plan: -f/u in 4 months -Continue Anastrozole     No orders of the defined types were placed in this encounter.  All questions were answered. The patient knows to call the clinic with any problems, questions or concerns.  I spent 25 minutes counseling the patient face to face. The total time spent in the appointment was 30 minutes and more than 50% was on counseling.  IDierdre SearlesDweik am acting as scribe for Dr. YTruitt Merle  I have reviewed the above documentation for accuracy and completeness, and I agree with the above.      YTruitt Merle MD 10/18/2017

## 2017-10-19 ENCOUNTER — Encounter: Payer: Self-pay | Admitting: Hematology

## 2017-10-19 ENCOUNTER — Telehealth: Payer: Self-pay

## 2017-10-19 LAB — COAG STUDIES INTERP REPORT

## 2017-10-19 LAB — VON WILLEBRAND PANEL
COAGULATION FACTOR VIII: 129 % (ref 56–140)
Ristocetin Co-factor, Plasma: 70 % (ref 50–200)
Von Willebrand Antigen, Plasma: 82 % (ref 50–200)

## 2017-10-19 LAB — VITAMIN D 25 HYDROXY (VIT D DEFICIENCY, FRACTURES): VIT D 25 HYDROXY: 32.4 ng/mL (ref 30.0–100.0)

## 2017-10-19 NOTE — Telephone Encounter (Signed)
Spoke with patient per Dr. Burr Medico notified her lab results are good, no concerns, patient verbalized an understanding.

## 2017-10-19 NOTE — Telephone Encounter (Signed)
-----   Message from Truitt Merle, MD sent at 10/19/2017  2:05 PM EDT ----- Please let pt know her lab results, no concerns, thanks  Truitt Merle  10/19/2017

## 2017-10-26 ENCOUNTER — Telehealth: Payer: Self-pay

## 2017-10-26 NOTE — Telephone Encounter (Signed)
-----   Message from Truitt Merle, MD sent at 10/23/2017  3:09 PM EDT ----- Please let pt know that her VWF test was normal, thanks   Truitt Merle  10/23/2017

## 2017-10-26 NOTE — Telephone Encounter (Signed)
Spoke with patient informed VWF result was normal, patient verbalized an understanding.

## 2017-11-25 DIAGNOSIS — C50411 Malignant neoplasm of upper-outer quadrant of right female breast: Secondary | ICD-10-CM | POA: Diagnosis not present

## 2017-11-26 ENCOUNTER — Other Ambulatory Visit: Payer: BLUE CROSS/BLUE SHIELD

## 2017-11-26 ENCOUNTER — Other Ambulatory Visit: Payer: Self-pay | Admitting: General Surgery

## 2017-11-26 DIAGNOSIS — R591 Generalized enlarged lymph nodes: Secondary | ICD-10-CM

## 2017-11-30 ENCOUNTER — Ambulatory Visit
Admission: RE | Admit: 2017-11-30 | Discharge: 2017-11-30 | Disposition: A | Discharge status: Home / Self Care | Payer: BLUE CROSS/BLUE SHIELD | Source: Ambulatory Visit | Attending: General Surgery | Admitting: General Surgery

## 2017-11-30 DIAGNOSIS — Z9013 Acquired absence of bilateral breasts and nipples: Secondary | ICD-10-CM | POA: Diagnosis not present

## 2017-11-30 DIAGNOSIS — R591 Generalized enlarged lymph nodes: Secondary | ICD-10-CM

## 2017-12-03 DIAGNOSIS — Z853 Personal history of malignant neoplasm of breast: Secondary | ICD-10-CM | POA: Diagnosis not present

## 2017-12-03 DIAGNOSIS — Z923 Personal history of irradiation: Secondary | ICD-10-CM | POA: Diagnosis not present

## 2017-12-03 DIAGNOSIS — C50911 Malignant neoplasm of unspecified site of right female breast: Secondary | ICD-10-CM | POA: Diagnosis not present

## 2017-12-03 DIAGNOSIS — L905 Scar conditions and fibrosis of skin: Secondary | ICD-10-CM | POA: Diagnosis not present

## 2017-12-03 DIAGNOSIS — Z9013 Acquired absence of bilateral breasts and nipples: Secondary | ICD-10-CM | POA: Diagnosis not present

## 2017-12-03 DIAGNOSIS — Z170 Estrogen receptor positive status [ER+]: Secondary | ICD-10-CM | POA: Diagnosis not present

## 2017-12-03 DIAGNOSIS — Z483 Aftercare following surgery for neoplasm: Secondary | ICD-10-CM | POA: Diagnosis not present

## 2017-12-22 DIAGNOSIS — Z9013 Acquired absence of bilateral breasts and nipples: Secondary | ICD-10-CM | POA: Diagnosis not present

## 2017-12-22 DIAGNOSIS — Z853 Personal history of malignant neoplasm of breast: Secondary | ICD-10-CM | POA: Diagnosis not present

## 2017-12-22 DIAGNOSIS — Z923 Personal history of irradiation: Secondary | ICD-10-CM | POA: Diagnosis not present

## 2018-01-03 ENCOUNTER — Ambulatory Visit: Payer: BLUE CROSS/BLUE SHIELD | Admitting: Physical Therapy

## 2018-01-03 DIAGNOSIS — J0100 Acute maxillary sinusitis, unspecified: Secondary | ICD-10-CM | POA: Diagnosis not present

## 2018-01-03 DIAGNOSIS — C50919 Malignant neoplasm of unspecified site of unspecified female breast: Secondary | ICD-10-CM | POA: Diagnosis not present

## 2018-01-19 ENCOUNTER — Other Ambulatory Visit: Payer: Self-pay | Admitting: Dermatology

## 2018-01-19 DIAGNOSIS — D485 Neoplasm of uncertain behavior of skin: Secondary | ICD-10-CM | POA: Diagnosis not present

## 2018-01-24 ENCOUNTER — Other Ambulatory Visit: Payer: Self-pay | Admitting: Hematology

## 2018-01-24 DIAGNOSIS — Z170 Estrogen receptor positive status [ER+]: Principal | ICD-10-CM

## 2018-01-24 DIAGNOSIS — C50811 Malignant neoplasm of overlapping sites of right female breast: Secondary | ICD-10-CM

## 2018-01-25 ENCOUNTER — Encounter: Payer: Self-pay | Admitting: Physical Therapy

## 2018-01-25 ENCOUNTER — Ambulatory Visit: Payer: BLUE CROSS/BLUE SHIELD | Attending: General Surgery | Admitting: Physical Therapy

## 2018-01-25 ENCOUNTER — Other Ambulatory Visit: Payer: Self-pay

## 2018-01-25 DIAGNOSIS — L599 Disorder of the skin and subcutaneous tissue related to radiation, unspecified: Secondary | ICD-10-CM | POA: Diagnosis not present

## 2018-01-25 DIAGNOSIS — M6281 Muscle weakness (generalized): Secondary | ICD-10-CM

## 2018-01-25 DIAGNOSIS — M542 Cervicalgia: Secondary | ICD-10-CM

## 2018-01-25 DIAGNOSIS — R293 Abnormal posture: Secondary | ICD-10-CM | POA: Insufficient documentation

## 2018-01-25 DIAGNOSIS — M62838 Other muscle spasm: Secondary | ICD-10-CM

## 2018-01-25 NOTE — Therapy (Signed)
[name], [place], [address] Phone: [phone]   Fax:  [phone]  Physical Therapy Evaluation  Patient Details  Name: [name] MRN: [id number] Date of Birth: [birth year] Referring Provider (PT): Dr. Donne Hazel   Encounter Date: 01/25/2018  PT End of Session - 01/25/18 1652    Visit Number  1    Number of Visits  9    Date for PT Re-Evaluation  02/22/18    PT Start Time  1606    PT Stop Time  1646    PT Time Calculation (min)  40 min    Activity Tolerance  Patient tolerated treatment well    Behavior During Therapy  [facility] for tasks assessed/performed       Past Medical History:  Diagnosis Date  . Anxiety   . Anxiety disorder   . Asthma    h/o asthma as a child  . Breast cancer ([place])   . Cancer ([place]) 04/2016   right breast cancer  . Cholelithiasis   . Chronic kidney disease    obstruction of R kidney, ( not a stone) - currently resolved   . Depression   . Diverticulosis   . DJD (degenerative joint disease)    hands & back   . Dyspnea    resolved since she stopped smoking   . Epigastric abdominal pain   . Esophageal stricture   . Family history of adverse reaction to anesthesia    daughter has N&V, takes long time to wake up   . Family history of colon cancer   . Family history of ovarian cancer   . Family hx of colon cancer   . 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 Ganglion cyst   . GERD (gastroesophageal reflux disease)   . Headache    low grade currently , family history of migraines   . Hemorrhoid   . History of radiation therapy 05/03 - 08/11/2016   1. 4 field Right breast was treated to 50.4 Gy in 25 fractions at 1.8 Gy per fraction. 2. The Right breast was boosted to 10 Gy in 5 fractions at 2 Gy per fraction.  . Hypothyroidism   . Plantar fasciitis, bilateral   . PONV (postoperative nausea and vomiting)    gets anxious with the mask on her face, also remarks  that the scop. patch has helped in the past       Past Surgical History:  Procedure Laterality Date  . ABDOMINAL HYSTERECTOMY    . BREAST RECONSTRUCTION WITH PLACEMENT OF TISSUE EXPANDER AND FLEX HD (ACELLULAR HYDRATED DERMIS) Bilateral 05/19/2016   Procedure: BREAST RECONSTRUCTION WITH PLACEMENT OF TISSUE EXPANDER AND ALLODERM PLACEMENT;  Surgeon: Irene Limbo, MD;  Location: [facility];  Service: Plastics;  Laterality: Bilateral;  . BREAST RECONSTRUCTION WITH PLACEMENT OF TISSUE EXPANDER AND FLEX HD (ACELLULAR HYDRATED DERMIS) Left 06/28/2017   Procedure: LEFT BREAST RECONSTRUCTION WITH SILICONE IMPLANT EXCHANGE AND ACELLULARDERMIS TO LEFT CHEST;  Surgeon: Irene Limbo, MD;  Location: [facility];  Service: Plastics;  Laterality: Left;  . CESAREAN SECTION  1988  . CHOLECYSTECTOMY    . HEMORRHOID SURGERY    . LAPAROSCOPIC BILATERAL SALPINGECTOMY N/A 10/26/2012   Procedure: operative laparoscopy with lysis of adhesions;  Surgeon: Thornell Sartorius, MD;  Location: [facility];  Service: Gynecology;  Laterality: N/A;  . MASTECTOMY Bilateral   . MASTECTOMY WITH RADIOACTIVE SEED GUIDED EXCISION AND AXILLARY SENTINEL LYMPH NODE BIOPSY Bilateral 05/19/2016   Procedure:  RIGHT SKIN SPARING MASTECTOMY WITH RIGHT RADIOACTIVE SEED TARGETED DISSECTION AND RIGHT SENTINEL LYMPH NODE BIOPSY, LEFT PROPHYLACTIC SKIN SPARING MASTECTOMY;  Surgeon: Rolm Bookbinder, MD;  Location: [facility];  Service: General;  Laterality: Bilateral;  . REMOVAL OF BILATERAL TISSUE EXPANDERS WITH PLACEMENT OF BILATERAL BREAST IMPLANTS Bilateral 01/29/2017   Procedure: REMOVAL OF BILATERAL TISSUE EXPANDERS WITH PLACEMENT OF BILATERAL SILICONE BREAST IMPLANTS, ALLODERM TO LEFT BREAST RECONSTRUCTION;  RIGHT LATISSUMUS FLAP;  Surgeon: Irene Limbo, MD;  Location: [facility];  Service: Plastics;  Laterality: Bilateral;  Requesting RNFA  . SCAR REVISION Right 06/28/2017   Procedure: COMPLEX REVISION OF  BACK SCAR;  Surgeon: Irene Limbo, MD;  Location: [facility];  Service: Plastics;  Laterality: Right;  . TONSILLECTOMY    . TUBAL LIGATION    . urologic surgery for ureteropelvic junction obstruction      There were no vitals filed for this visit.   Subjective Assessment - 01/25/18 1612    Subjective  I get pain in my right side and it gets so bad it goes down to my hand. I get soem relief some time when I keep my arm raised in the air. I am in pain most of the time.     Pertinent History  right breast cancer, bilateral mastectomy and SLNB (7 or 8 and a few were positive), pt currently receiving chemotherapy and has completed radiation, she underwent lat flap reconstruction bilaterally, pt underwent a revision to back scar recently (Oct or Nov 2019) . Dad and sister have a rare blood disorder that pt is being evaluated    Patient Stated Goals  to have no pain    Currently in Pain?  Yes    Pain Score  7     Pain Location  --   trunk   Pain Orientation  Right;Lateral    Pain Descriptors / Indicators  Tightness;Sharp;Burning    Pain Type  Chronic pain    Pain Radiating Towards  radiates to arm    Pain Onset  More than a month ago    Pain Frequency  Constant    Aggravating Factors   overdoing it, working 40 hours a week    Pain Relieving Factors  sleeping, muscle relaxer    Effect of Pain on Daily Activities  house chores- gives out quickly, can not get comfortable in bed         [facility] PT Assessment - 01/25/18 0001      Assessment   Medical Diagnosis  s/p bilateral mastectomy with right 7 nodes removed on the right      Referring Provider (PT)  Dr. Donne Hazel    Onset Date/Surgical Date  12/03/17    Hand Dominance  Right    Prior Therapy  therapy at this clinic for tightness and pain   yes     Precautions   Precautions  Other (comment)    Precaution Comments  at risk for lymphedema      Restrictions   Weight Bearing Restrictions  No      Balance Screen    Has the patient fallen in the past 6 months  No    Has the patient had a decrease in activity level because of a fear of falling?   No    Is the patient reluctant to leave their home because of a fear of falling?   No      Home Social worker  Private residence    Living  Arrangements  Spouse/significant other;Other relatives   husband and 56 y.o. 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    Leisure  not a regular exerciser    pt states she gets out of breath easier      Cognition   Overall Cognitive Status  Within Functional Limits for tasks assessed      Observation/Other Assessments   Observations  --    Other Surveys   --    Quick DASH   --      Sensation   Additional Comments  --      Coordination   Gross Motor Movements are Fluid and Coordinated  --      Posture/Postural Control   Posture/Postural Control  Postural limitations    Postural Limitations  Forward head;Rounded Shoulders      ROM / Strength   AROM / PROM / Strength  AROM      AROM   Overall AROM   Within functional limits for tasks performed    AROM Assessment Site  Shoulder    Right/Left Shoulder  Right;Left    Right Shoulder Flexion  --    Right Shoulder ABduction  --   tighness and pulling under her right breast    Left Shoulder Flexion  --    Left Shoulder ABduction  --      Strength   Overall Strength  --      Palpation   Palpation comment  very tight and tender under right breast and at right lateral trunk         LYMPHEDEMA/ONCOLOGY QUESTIONNAIRE - 01/25/18 1627      Type   Cancer Type  right breast cancer      Surgeries   Mastectomy Date  05/19/16    Number Lymph Nodes Removed  7      Treatment   Active Chemotherapy Treatment  Yes    Past Chemotherapy Treatment  No    Active Radiation Treatment  No     Past Radiation Treatment  Yes    Current Hormone Treatment  Yes    Date  09/03/16    Drug Name  Aromasin      What other symptoms do you have   Are you Having Heaviness or Tightness  Yes    Are you having Pain  Yes    Are you having pitting edema  No    Is it Hard or Difficult finding clothes that fit  No    Do you have infections  No    Is there Decreased scar mobility  Yes      Right Upper Extremity Lymphedema   15 cm Proximal to Olecranon Process  32 cm    10 cm Proximal to Olecranon Process  30.3 cm    Olecranon Process  26.7 cm    15 cm Proximal to Ulnar Styloid Process  25.5 cm    10 cm Proximal to Ulnar Styloid Process  22.6 cm    Just Proximal to Ulnar Styloid Process  17.8 cm    Across Hand at [employer]  20.1 cm    At [place] of 2nd Digit  6.6 cm      Left Upper Extremity Lymphedema   15 cm Proximal to Olecranon Process  32 cm  10 cm Proximal to Olecranon Process  30.7 cm    Olecranon Process  27 cm    15 cm Proximal to Ulnar Styloid Process  25.3 cm    10 cm Proximal to Ulnar Styloid Process  22.4 cm    Just Proximal to Ulnar Styloid Process  17 cm    Across Hand at [employer]  18.4 cm    At [place] of 2nd Digit  6.4 cm             Objective measurements completed on examination: See above findings.      [facility] Adult PT Treatment/Exercise - 01/25/18 0001      Exercises   Exercises  Other Exercises    Other Exercises   instructed pt in median nerve stretch at wall holding for 15-30 sec and doing several times throughout the day                  PT Long Term Goals - 01/25/18 1700      PT LONG TERM GOAL #1   Title  Pt will report her pain  and tightness in her right upper quadrant is decreased by 50% so that she can perfrom her activites of daily living easier     Time  4    Period  Weeks    Status  New    Target Date  02/22/18      PT LONG TERM GOAL #2   Title  Pt will be independent in a home exercise program for continued  stretching and scar mobilization     Time  4    Period  Weeks    Status  New    Target Date  02/22/18      PT LONG TERM GOAL #3   Title  Pt will report a 25% improvement in scar tissue in inferior right breast to allow improved comfort    Time  4    Period  Weeks    Status  New    Target Date  02/22/18      PT LONG TERM GOAL #4   Title  Pt will report a 50% improvement in neck and upper back pain to allow improved comfort especially at work    Time  4    Period  Weeks    Status  New    Target Date  02/22/18             Plan - 01/25/18 1654    Clinical Impression Statement  Pt previously seen at this clinic earlier this year and has returned due to increased pain and tightness in her right lateral trunk and inferior to her right breast. She has increased scar tissue present in this area. She is also having neck pain and has increased tightness throughout neck and upper back musculature. Pt would benefit from skilled PT services to decrease scar tissue and improve skin mobility to decrease pain and to decrease neck pain and educate pt how she can continue to do this independently at home.     History and Personal Factors relevant to plan of care:  multiple previous surgies and treatments    Clinical Presentation  Stable    Clinical Presentation due to:  pt has finished treatments    Clinical Decision Making  Low    Rehab Potential  Good    Clinical Impairments Affecting Rehab Potential  7 nodes removed and radition  on the right     PT Frequency  2x / week    PT Duration  4 weeks    PT Treatment/Interventions  ADLs/Self Care Home Management;Therapeutic exercise;Therapeutic activities;Patient/family education;Manual techniques;Manual lymph drainage;Scar mobilization;Passive range of motion;Taping    PT Next Visit Plan  begin myofascial release to right lateral trunk and scar mobilization and instruct pt in scar mobilization, stretches for neck and upper back to decrease pain     Consulted and Agree with Plan of Care  Patient       Patient will benefit from skilled therapeutic intervention in order to improve the following deficits and impairments:  Postural dysfunction, Decreased knowledge of precautions, Pain, Impaired UE functional use, Decreased range of motion, Increased muscle spasms, Decreased scar mobility  Visit Diagnosis: Disorder of the skin and subcutaneous tissue related to radiation, unspecified  Other muscle spasm  Cervicalgia  Abnormal posture  Muscle weakness (generalized)     Problem List Patient Active Problem List   Diagnosis Date Noted  . Family history of von Willebrand disease 06/21/2017  . Genetic testing 04/29/2017  . Family history of ovarian cancer   . Family history of colon cancer   . Acquired absence of breast and nipple 01/29/2017  . Breast cancer, right ([place]) 05/19/2016  . Malignant neoplasm of overlapping sites of right breast in female, estrogen receptor positive ([place]) 03/31/2016  . 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 Hydrosalpinx 10/25/2012  . Sciatica of right side 03/31/2012  . Routine general medical examination at a health care facility 08/27/2010  . SPONTANEOUS ECCHYMOSES 01/03/2009  . ANXIETY DISORDER 07/04/2007  . HYPOTHYROIDISM 06/23/2007  . HEMORRHOIDS 06/23/2007  . ESOPHAGEAL STRICTURE 06/23/2007  . GERD 06/23/2007  . DEGENERATIVE JOINT DISEASE 06/23/2007  . ARTHRITIS 06/23/2007  . SLEEP APNEA 06/23/2007    [facility] 01/25/2018, 5:03 PM  [place], [place], [address] Phone: [phone]   Fax:  [phone]  Name: JEZABELLE CHISOLM MRN: [id number] Date of Birth: 1961/02/02  Manus Gunning, PT 01/25/18 5:03 PM

## 2018-01-26 ENCOUNTER — Other Ambulatory Visit: Payer: Self-pay

## 2018-01-26 DIAGNOSIS — Z170 Estrogen receptor positive status [ER+]: Principal | ICD-10-CM

## 2018-01-26 DIAGNOSIS — C50811 Malignant neoplasm of overlapping sites of right female breast: Secondary | ICD-10-CM

## 2018-01-26 MED ORDER — VENLAFAXINE HCL ER 75 MG PO CP24: 75.0000 mg | ORAL_CAPSULE | Freq: Every day | ORAL | 2 refills | Status: DC

## 2018-01-31 ENCOUNTER — Ambulatory Visit: Payer: BLUE CROSS/BLUE SHIELD | Admitting: Physical Therapy

## 2018-02-03 ENCOUNTER — Ambulatory Visit: Payer: BLUE CROSS/BLUE SHIELD | Admitting: Physical Therapy

## 2018-02-03 ENCOUNTER — Encounter: Payer: Self-pay | Admitting: Physical Therapy

## 2018-02-03 ENCOUNTER — Other Ambulatory Visit: Payer: Self-pay

## 2018-02-03 DIAGNOSIS — L599 Disorder of the skin and subcutaneous tissue related to radiation, unspecified: Secondary | ICD-10-CM

## 2018-02-03 DIAGNOSIS — M6281 Muscle weakness (generalized): Secondary | ICD-10-CM | POA: Diagnosis not present

## 2018-02-03 DIAGNOSIS — M542 Cervicalgia: Secondary | ICD-10-CM | POA: Diagnosis not present

## 2018-02-03 DIAGNOSIS — R293 Abnormal posture: Secondary | ICD-10-CM | POA: Diagnosis not present

## 2018-02-03 DIAGNOSIS — M62838 Other muscle spasm: Secondary | ICD-10-CM

## 2018-02-03 NOTE — Therapy (Addendum)
[place], [place], [address] Phone: [phone]   Fax:  [phone]  Physical Therapy Treatment  Patient Details  Name: [name] MRN: [id number] Date of Birth: 1960-05-01 Referring Provider (PT): Dr. Donne Hazel   Encounter Date: 02/03/2018  PT End of Session - 02/03/18 1657    Visit Number  2    Number of Visits  9    Date for PT Re-Evaluation  02/22/18    PT Start Time  1606    PT Stop Time  1647    PT Time Calculation (min)  41 min    Activity Tolerance  Patient tolerated treatment well    Behavior During Therapy  [facility] for tasks assessed/performed       Past Medical History:  Diagnosis Date  . Anxiety   . Anxiety disorder   . Asthma    h/o asthma as a child  . Breast cancer ([place])   . Cancer ([place]) 04/2016   right breast cancer  . Cholelithiasis   . Chronic kidney disease    obstruction of R kidney, ( not a stone) - currently resolved   . Depression   . Diverticulosis   . DJD (degenerative joint disease)    hands & back   . Dyspnea    resolved since she stopped smoking   . Epigastric abdominal pain   . Esophageal stricture   . Family history of adverse reaction to anesthesia    daughter has N&V, takes long time to wake up   . Family history of colon cancer   . Family history of ovarian cancer   . Family hx of colon cancer   . 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 Ganglion cyst   . GERD (gastroesophageal reflux disease)   . Headache    low grade currently , family history of migraines   . Hemorrhoid   . History of radiation therapy 05/03 - 08/11/2016   1. 4 field Right breast was treated to 50.4 Gy in 25 fractions at 1.8 Gy per fraction. 2. The Right breast was boosted to 10 Gy in 5 fractions at 2 Gy per fraction.  . Hypothyroidism   . Plantar fasciitis, bilateral   . PONV (postoperative nausea and vomiting)    gets anxious with the mask on her face, also remarks  that the scop. patch has helped in the past       Past Surgical History:  Procedure Laterality Date  . ABDOMINAL HYSTERECTOMY    . BREAST RECONSTRUCTION WITH PLACEMENT OF TISSUE EXPANDER AND FLEX HD (ACELLULAR HYDRATED DERMIS) Bilateral 05/19/2016   Procedure: BREAST RECONSTRUCTION WITH PLACEMENT OF TISSUE EXPANDER AND ALLODERM PLACEMENT;  Surgeon: Irene Limbo, MD;  Location: [place];  Service: Plastics;  Laterality: Bilateral;  . BREAST RECONSTRUCTION WITH PLACEMENT OF TISSUE EXPANDER AND FLEX HD (ACELLULAR HYDRATED DERMIS) Left 06/28/2017   Procedure: LEFT BREAST RECONSTRUCTION WITH SILICONE IMPLANT EXCHANGE AND ACELLULARDERMIS TO LEFT CHEST;  Surgeon: Irene Limbo, MD;  Location: [facility];  Service: Plastics;  Laterality: Left;  . CESAREAN SECTION  1988  . CHOLECYSTECTOMY    . HEMORRHOID SURGERY    . LAPAROSCOPIC BILATERAL SALPINGECTOMY N/A 10/26/2012   Procedure: operative laparoscopy with lysis of adhesions;  Surgeon: Thornell Sartorius, MD;  Location: [facility];  Service: Gynecology;  Laterality: N/A;  . MASTECTOMY Bilateral   . MASTECTOMY WITH RADIOACTIVE SEED GUIDED EXCISION AND AXILLARY SENTINEL LYMPH NODE BIOPSY Bilateral 05/19/2016   Procedure:  RIGHT SKIN SPARING MASTECTOMY WITH RIGHT RADIOACTIVE SEED TARGETED DISSECTION AND RIGHT SENTINEL LYMPH NODE BIOPSY, LEFT PROPHYLACTIC SKIN SPARING MASTECTOMY;  Surgeon: Rolm Bookbinder, MD;  Location: [facility];  Service: General;  Laterality: Bilateral;  . REMOVAL OF BILATERAL TISSUE EXPANDERS WITH PLACEMENT OF BILATERAL BREAST IMPLANTS Bilateral 01/29/2017   Procedure: REMOVAL OF BILATERAL TISSUE EXPANDERS WITH PLACEMENT OF BILATERAL SILICONE BREAST IMPLANTS, ALLODERM TO LEFT BREAST RECONSTRUCTION;  RIGHT LATISSUMUS FLAP;  Surgeon: Irene Limbo, MD;  Location: [facility];  Service: Plastics;  Laterality: Bilateral;  Requesting RNFA  . SCAR REVISION Right 06/28/2017   Procedure: COMPLEX REVISION OF  BACK SCAR;  Surgeon: Irene Limbo, MD;  Location: [facility];  Service: Plastics;  Laterality: Right;  . TONSILLECTOMY    . TUBAL LIGATION    . urologic surgery for ureteropelvic junction obstruction      There were no vitals filed for this visit.  Subjective Assessment - 02/03/18 1608    Subjective  I am tight today. It is not quite as bad. Yesterday it was pretty rough. I asked for an adjustable desk. My tension today is in my neck.     Pertinent History  right breast cancer, bilateral mastectomy and SLNB (7 or 8 and a few were positive), pt currently receiving chemotherapy and has completed radiation, she underwent lat flap reconstruction bilaterally, pt underwent a revision to back scar recently (Oct or Nov 2019) . Dad and sister have a rare blood disorder that pt is being evaluated    Patient Stated Goals  to have no pain    Currently in Pain?  Yes    Pain Score  4     Pain Location  --   trunk   Pain Orientation  Right;Lateral                       [facility] Adult PT Treatment/Exercise - 02/03/18 0001      Manual Therapy   Manual Therapy  Soft tissue mobilization;Myofascial release    Soft tissue mobilization  along inferior breast scar in area of tightness and along right lateral trunk in area of tightness and pain using biotone    Myofascial Release  along right lateral trunk cross hands                  PT Long Term Goals - 01/25/18 1700      PT LONG TERM GOAL #1   Title  Pt will report her pain  and tightness in her right upper quadrant is decreased by 50% so that she can perfrom her activites of daily living easier     Time  4    Period  Weeks    Status  New    Target Date  02/22/18      PT LONG TERM GOAL #2   Title  Pt will be independent in a home exercise program for continued stretching and scar mobilization     Time  4    Period  Weeks    Status  New    Target Date  02/22/18      PT LONG TERM GOAL #3   Title  Pt  will report a 25% improvement in scar tissue in inferior right breast to allow improved comfort    Time  4    Period  Weeks    Status  New    Target Date  02/22/18      PT LONG TERM  GOAL #4   Title  Pt will report a 50% improvement in neck and upper back pain to allow improved comfort especially at work    Time  4    Period  Weeks    Status  New    Target Date  02/22/18            Plan - 02/03/18 1658    Clinical Impression Statement  Began soft tissue mobilization and myofascial release today to pt's right lateral trunk and along scar line under breast. Pt has increased tightness in these areas as well as scar tissue which causes her pain.     Rehab Potential  Good    Clinical Impairments Affecting Rehab Potential  7 nodes removed and radition  on the right     PT Frequency  2x / week    PT Duration  4 weeks    PT Treatment/Interventions  ADLs/Self Care Home Management;Therapeutic exercise;Therapeutic activities;Patient/family education;Manual techniques;Manual lymph drainage;Scar mobilization;Passive range of motion;Taping    PT Next Visit Plan  continue myofascial release to right lateral trunk and scar mobilization and instruct pt in scar mobilization, stretches for neck and upper back to decrease pain    Consulted and Agree with Plan of Care  Patient       Patient will benefit from skilled therapeutic intervention in order to improve the following deficits and impairments:  Postural dysfunction, Decreased knowledge of precautions, Pain, Impaired UE functional use, Decreased range of motion, Increased muscle spasms, Decreased scar mobility  Visit Diagnosis: Disorder of the skin and subcutaneous tissue related to radiation, unspecified  Other muscle spasm     Problem List Patient Active Problem List   Diagnosis Date Noted  . Family history of von Willebrand disease 06/21/2017  . Genetic testing 04/29/2017  . Family history of ovarian cancer   . Family history of  colon cancer   . Acquired absence of breast and nipple 01/29/2017  . Breast cancer, right ([place]) 05/19/2016  . Malignant neoplasm of overlapping sites of right breast in female, estrogen receptor positive ([place]) 03/31/2016  . 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 Hydrosalpinx 10/25/2012  . Sciatica of right side 03/31/2012  . Routine general medical examination at a health care facility 08/27/2010  . SPONTANEOUS ECCHYMOSES 01/03/2009  . ANXIETY DISORDER 07/04/2007  . HYPOTHYROIDISM 06/23/2007  . HEMORRHOIDS 06/23/2007  . ESOPHAGEAL STRICTURE 06/23/2007  . GERD 06/23/2007  . DEGENERATIVE JOINT DISEASE 06/23/2007  . ARTHRITIS 06/23/2007  . SLEEP APNEA 06/23/2007    [facility] 02/03/2018, 5:00 PM  [place], [place], [address] Phone: [phone]   Fax:  [phone]  Name: MIETTE MOLENDA MRN: [id number] Date of Birth: [birth year]  Manus Gunning, PT 02/03/18 5:02 PM

## 2018-02-08 ENCOUNTER — Other Ambulatory Visit: Payer: Self-pay

## 2018-02-08 ENCOUNTER — Encounter: Payer: Self-pay | Admitting: Physical Therapy

## 2018-02-08 ENCOUNTER — Ambulatory Visit: Payer: BLUE CROSS/BLUE SHIELD | Admitting: Physical Therapy

## 2018-02-08 DIAGNOSIS — L599 Disorder of the skin and subcutaneous tissue related to radiation, unspecified: Secondary | ICD-10-CM | POA: Diagnosis not present

## 2018-02-08 DIAGNOSIS — M542 Cervicalgia: Secondary | ICD-10-CM | POA: Diagnosis not present

## 2018-02-08 DIAGNOSIS — R293 Abnormal posture: Secondary | ICD-10-CM | POA: Diagnosis not present

## 2018-02-08 DIAGNOSIS — M6281 Muscle weakness (generalized): Secondary | ICD-10-CM | POA: Diagnosis not present

## 2018-02-08 DIAGNOSIS — M62838 Other muscle spasm: Secondary | ICD-10-CM | POA: Diagnosis not present

## 2018-02-08 NOTE — Therapy (Signed)
[place], [place], [address] Phone: [phone]   Fax:  [phone]  Physical Therapy Treatment  Patient Details  Name: [name] MRN: [id number] Date of Birth: 1960/04/27 Referring Provider (PT): Dr. Donne Hazel   Encounter Date: 02/08/2018  PT End of Session - 02/08/18 1658    Visit Number  3    Number of Visits  9    Date for PT Re-Evaluation  02/22/18    PT Start Time  4580   pt arrived late   PT Stop Time  1645    PT Time Calculation (min)  38 min    Activity Tolerance  Patient tolerated treatment well    Behavior During Therapy  [facility] for tasks assessed/performed       Past Medical History:  Diagnosis Date  . Anxiety   . Anxiety disorder   . Asthma    h/o asthma as a child  . Breast cancer ([place])   . Cancer ([place]) 04/2016   right breast cancer  . Cholelithiasis   . Chronic kidney disease    obstruction of R kidney, ( not a stone) - currently resolved   . Depression   . Diverticulosis   . DJD (degenerative joint disease)    hands & back   . Dyspnea    resolved since she stopped smoking   . Epigastric abdominal pain   . Esophageal stricture   . Family history of adverse reaction to anesthesia    daughter has N&V, takes long time to wake up   . Family history of colon cancer   . Family history of ovarian cancer   . Family hx of colon cancer   . 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 Ganglion cyst   . GERD (gastroesophageal reflux disease)   . Headache    low grade currently , family history of migraines   . Hemorrhoid   . History of radiation therapy 05/03 - 08/11/2016   1. 4 field Right breast was treated to 50.4 Gy in 25 fractions at 1.8 Gy per fraction. 2. The Right breast was boosted to 10 Gy in 5 fractions at 2 Gy per fraction.  . Hypothyroidism   . Plantar fasciitis, bilateral   . PONV (postoperative nausea and vomiting)    gets anxious with the mask on her  face, also remarks that the scop. patch has helped in the past       Past Surgical History:  Procedure Laterality Date  . ABDOMINAL HYSTERECTOMY    . BREAST RECONSTRUCTION WITH PLACEMENT OF TISSUE EXPANDER AND FLEX HD (ACELLULAR HYDRATED DERMIS) Bilateral 05/19/2016   Procedure: BREAST RECONSTRUCTION WITH PLACEMENT OF TISSUE EXPANDER AND ALLODERM PLACEMENT;  Surgeon: Irene Limbo, MD;  Location: [facility];  Service: Plastics;  Laterality: Bilateral;  . BREAST RECONSTRUCTION WITH PLACEMENT OF TISSUE EXPANDER AND FLEX HD (ACELLULAR HYDRATED DERMIS) Left 06/28/2017   Procedure: LEFT BREAST RECONSTRUCTION WITH SILICONE IMPLANT EXCHANGE AND ACELLULARDERMIS TO LEFT CHEST;  Surgeon: Irene Limbo, MD;  Location: [facility];  Service: Plastics;  Laterality: Left;  . CESAREAN SECTION  1988  . CHOLECYSTECTOMY    . HEMORRHOID SURGERY    . LAPAROSCOPIC BILATERAL SALPINGECTOMY N/A 10/26/2012   Procedure: operative laparoscopy with lysis of adhesions;  Surgeon: Thornell Sartorius, MD;  Location: [facility];  Service: Gynecology;  Laterality: N/A;  . MASTECTOMY Bilateral   . MASTECTOMY WITH RADIOACTIVE SEED GUIDED EXCISION AND AXILLARY SENTINEL LYMPH NODE BIOPSY Bilateral  05/19/2016   Procedure: RIGHT SKIN SPARING MASTECTOMY WITH RIGHT RADIOACTIVE SEED TARGETED DISSECTION AND RIGHT SENTINEL LYMPH NODE BIOPSY, LEFT PROPHYLACTIC SKIN SPARING MASTECTOMY;  Surgeon: Rolm Bookbinder, MD;  Location: [facility];  Service: General;  Laterality: Bilateral;  . REMOVAL OF BILATERAL TISSUE EXPANDERS WITH PLACEMENT OF BILATERAL BREAST IMPLANTS Bilateral 01/29/2017   Procedure: REMOVAL OF BILATERAL TISSUE EXPANDERS WITH PLACEMENT OF BILATERAL SILICONE BREAST IMPLANTS, ALLODERM TO LEFT BREAST RECONSTRUCTION;  RIGHT LATISSUMUS FLAP;  Surgeon: Irene Limbo, MD;  Location: [facility];  Service: Plastics;  Laterality: Bilateral;  Requesting RNFA  . SCAR REVISION Right 06/28/2017   Procedure:  COMPLEX REVISION OF BACK SCAR;  Surgeon: Irene Limbo, MD;  Location: [facility];  Service: Plastics;  Laterality: Right;  . TONSILLECTOMY    . TUBAL LIGATION    . urologic surgery for ureteropelvic junction obstruction      There were no vitals filed for this visit.  Subjective Assessment - 02/08/18 1608    Subjective  I was sore after last time but then it was better for a few days but then the pain came back.   (Pended)     Pertinent History  right breast cancer, bilateral mastectomy and SLNB (7 or 8 and a few were positive), pt currently receiving chemotherapy and has completed radiation, she underwent lat flap reconstruction bilaterally, pt underwent a revision to back scar recently (Oct or Nov 2019) . Dad and sister have a rare blood disorder that pt is being evaluated  (Pended)     Patient Stated Goals  to have no pain  (Pended)     Currently in Pain?  Yes  (Pended)     Pain Score  6   (Pended)     Pain Location  --  (Pended)    trunk   Pain Orientation  Right;Left  (Pended)                        [facility] Adult PT Treatment/Exercise - 02/08/18 0001      Exercises   Other Exercises   instructed pt in serratus anterior stretch      Manual Therapy   Manual Therapy  Soft tissue mobilization;Myofascial release    Soft tissue mobilization  along inferior breast scar in area of tightness and along right lateral trunk in area of tightness and pain using biotone    Myofascial Release  along right lateral trunk cross hands                  PT Long Term Goals - 01/25/18 1700      PT LONG TERM GOAL #1   Title  Pt will report her pain  and tightness in her right upper quadrant is decreased by 50% so that she can perfrom her activites of daily living easier     Time  4    Period  Weeks    Status  New    Target Date  02/22/18      PT LONG TERM GOAL #2   Title  Pt will be independent in a home exercise program for continued stretching and  scar mobilization     Time  4    Period  Weeks    Status  New    Target Date  02/22/18      PT LONG TERM GOAL #3   Title  Pt will report a 25% improvement in scar tissue in inferior right breast to  allow improved comfort    Time  4    Period  Weeks    Status  New    Target Date  02/22/18      PT LONG TERM GOAL #4   Title  Pt will report a 50% improvement in neck and upper back pain to allow improved comfort especially at work    Time  4    Period  Weeks    Status  New    Target Date  02/22/18            Plan - 02/08/18 1701    Clinical Impression Statement  Perfomed myofascial release today and soft tissue moblization to scar tissue and area of tightness along serratus anterior. TIghtness improved with manual work today. Issued serratus anterior stretches.     Rehab Potential  Good    Clinical Impairments Affecting Rehab Potential  7 nodes removed and radition  on the right     PT Frequency  2x / week    PT Duration  4 weeks    PT Treatment/Interventions  ADLs/Self Care Home Management;Therapeutic exercise;Therapeutic activities;Patient/family education;Manual techniques;Manual lymph drainage;Scar mobilization;Passive range of motion;Taping    PT Next Visit Plan  continue myofascial release to right lateral trunk and scar mobilization and instruct pt in scar mobilization, stretches for neck and upper back to decrease pain    Consulted and Agree with Plan of Care  Patient       Patient will benefit from skilled therapeutic intervention in order to improve the following deficits and impairments:  Postural dysfunction, Decreased knowledge of precautions, Pain, Impaired UE functional use, Decreased range of motion, Increased muscle spasms, Decreased scar mobility  Visit Diagnosis: Disorder of the skin and subcutaneous tissue related to radiation, unspecified  Other muscle spasm     Problem List Patient Active Problem List   Diagnosis Date Noted  . Family history of von  Willebrand disease 06/21/2017  . Genetic testing 04/29/2017  . Family history of ovarian cancer   . Family history of colon cancer   . Acquired absence of breast and nipple 01/29/2017  . Breast cancer, right ([place]) 05/19/2016  . Malignant neoplasm of overlapping sites of right breast in female, estrogen receptor positive ([place]) 03/31/2016  . 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 Hydrosalpinx 10/25/2012  . Sciatica of right side 03/31/2012  . Routine general medical examination at a health care facility 08/27/2010  . SPONTANEOUS ECCHYMOSES 01/03/2009  . ANXIETY DISORDER 07/04/2007  . HYPOTHYROIDISM 06/23/2007  . HEMORRHOIDS 06/23/2007  . ESOPHAGEAL STRICTURE 06/23/2007  . GERD 06/23/2007  . DEGENERATIVE JOINT DISEASE 06/23/2007  . ARTHRITIS 06/23/2007  . SLEEP APNEA 06/23/2007    [name] [facility] 02/08/2018, 5:03 PM  [place], [place], [address] Phone: [phone]   Fax:  [phone]  Name: [name] MRN: [id number] Date of Birth: 02/19/1961  Manus Gunning, PT 02/08/18 5:03 PM

## 2018-02-10 ENCOUNTER — Ambulatory Visit: Payer: BLUE CROSS/BLUE SHIELD | Admitting: Physical Therapy

## 2018-02-10 ENCOUNTER — Other Ambulatory Visit: Payer: Self-pay

## 2018-02-10 ENCOUNTER — Encounter: Payer: Self-pay | Admitting: Physical Therapy

## 2018-02-10 DIAGNOSIS — M542 Cervicalgia: Secondary | ICD-10-CM | POA: Diagnosis not present

## 2018-02-10 DIAGNOSIS — L599 Disorder of the skin and subcutaneous tissue related to radiation, unspecified: Secondary | ICD-10-CM

## 2018-02-10 DIAGNOSIS — M62838 Other muscle spasm: Secondary | ICD-10-CM | POA: Diagnosis not present

## 2018-02-10 DIAGNOSIS — M6281 Muscle weakness (generalized): Secondary | ICD-10-CM | POA: Diagnosis not present

## 2018-02-10 DIAGNOSIS — R293 Abnormal posture: Secondary | ICD-10-CM | POA: Diagnosis not present

## 2018-02-10 NOTE — Therapy (Signed)
[place], [place], [address] Phone: [phone]   Fax:  [phone]  Physical Therapy Treatment  Patient Details  Name: [name] MRN: [id number] Date of Birth: [birth year] Referring Provider (PT): Dr. Donne Hazel   Encounter Date: 02/10/2018  PT End of Session - 02/10/18 1649    Visit Number  4    Number of Visits  9    Date for PT Re-Evaluation  02/22/18    PT Start Time  9628   pt arrived late   PT Stop Time  1646    PT Time Calculation (min)  40 min    Activity Tolerance  Patient tolerated treatment well    Behavior During Therapy  [facility] for tasks assessed/performed       Past Medical History:  Diagnosis Date  . Anxiety   . Anxiety disorder   . Asthma    h/o asthma as a child  . Breast cancer ([place])   . Cancer ([place]) 04/2016   right breast cancer  . Cholelithiasis   . Chronic kidney disease    obstruction of R kidney, ( not a stone) - currently resolved   . Depression   . Diverticulosis   . DJD (degenerative joint disease)    hands & back   . Dyspnea    resolved since she stopped smoking   . Epigastric abdominal pain   . Esophageal stricture   . Family history of adverse reaction to anesthesia    daughter has N&V, takes long time to wake up   . Family history of colon cancer   . Family history of ovarian cancer   . Family hx of colon cancer   . 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 Ganglion cyst   . GERD (gastroesophageal reflux disease)   . Headache    low grade currently , family history of migraines   . Hemorrhoid   . History of radiation therapy 05/03 - 08/11/2016   1. 4 field Right breast was treated to 50.4 Gy in 25 fractions at 1.8 Gy per fraction. 2. The Right breast was boosted to 10 Gy in 5 fractions at 2 Gy per fraction.  . Hypothyroidism   . Plantar fasciitis, bilateral   . PONV (postoperative nausea and vomiting)    gets anxious with the mask on her  face, also remarks that the scop. patch has helped in the past       Past Surgical History:  Procedure Laterality Date  . ABDOMINAL HYSTERECTOMY    . BREAST RECONSTRUCTION WITH PLACEMENT OF TISSUE EXPANDER AND FLEX HD (ACELLULAR HYDRATED DERMIS) Bilateral 05/19/2016   Procedure: BREAST RECONSTRUCTION WITH PLACEMENT OF TISSUE EXPANDER AND ALLODERM PLACEMENT;  Surgeon: Irene Limbo, MD;  Location: [place];  Service: Plastics;  Laterality: Bilateral;  . BREAST RECONSTRUCTION WITH PLACEMENT OF TISSUE EXPANDER AND FLEX HD (ACELLULAR HYDRATED DERMIS) Left 06/28/2017   Procedure: LEFT BREAST RECONSTRUCTION WITH SILICONE IMPLANT EXCHANGE AND ACELLULARDERMIS TO LEFT CHEST;  Surgeon: Irene Limbo, MD;  Location: [place];  Service: Plastics;  Laterality: Left;  . CESAREAN SECTION  1988  . CHOLECYSTECTOMY    . HEMORRHOID SURGERY    . LAPAROSCOPIC BILATERAL SALPINGECTOMY N/A 10/26/2012   Procedure: operative laparoscopy with lysis of adhesions;  Surgeon: Thornell Sartorius, MD;  Location: [facility];  Service: Gynecology;  Laterality: N/A;  . MASTECTOMY Bilateral   . MASTECTOMY WITH RADIOACTIVE SEED GUIDED EXCISION AND AXILLARY SENTINEL LYMPH NODE BIOPSY Bilateral  05/19/2016   Procedure: RIGHT SKIN SPARING MASTECTOMY WITH RIGHT RADIOACTIVE SEED TARGETED DISSECTION AND RIGHT SENTINEL LYMPH NODE BIOPSY, LEFT PROPHYLACTIC SKIN SPARING MASTECTOMY;  Surgeon: Rolm Bookbinder, MD;  Location: [place];  Service: General;  Laterality: Bilateral;  . REMOVAL OF BILATERAL TISSUE EXPANDERS WITH PLACEMENT OF BILATERAL BREAST IMPLANTS Bilateral 01/29/2017   Procedure: REMOVAL OF BILATERAL TISSUE EXPANDERS WITH PLACEMENT OF BILATERAL SILICONE BREAST IMPLANTS, ALLODERM TO LEFT BREAST RECONSTRUCTION;  RIGHT LATISSUMUS FLAP;  Surgeon: Irene Limbo, MD;  Location: [place];  Service: Plastics;  Laterality: Bilateral;  Requesting RNFA  . SCAR REVISION Right 06/28/2017   Procedure:  COMPLEX REVISION OF BACK SCAR;  Surgeon: Irene Limbo, MD;  Location: [place];  Service: Plastics;  Laterality: Right;  . TONSILLECTOMY    . TUBAL LIGATION    . urologic surgery for ureteropelvic junction obstruction      There were no vitals filed for this visit.  Subjective Assessment - 02/10/18 1607    Subjective  I hurt yesterday but today has not been too bad. I did my exercises yesterday.     Pertinent History  right breast cancer, bilateral mastectomy and SLNB (7 or 8 and a few were positive), pt currently receiving chemotherapy and has completed radiation, she underwent lat flap reconstruction bilaterally, pt underwent a revision to back scar recently (Oct or Nov 2019) . Dad and sister have a rare blood disorder that pt is being evaluated    Patient Stated Goals  to have no pain    Currently in Pain?  Yes    Pain Score  2     Pain Location  --   trunk   Pain Orientation  Right;Lateral                       [facility] Adult PT Treatment/Exercise - 02/10/18 0001      Exercises   Other Exercises   instructed pt in massage with tennis ball to serratus anterior by leaning against wall, downward dog stretch using raised mat table to stretch serratus anterior      Manual Therapy   Manual Therapy  Soft tissue mobilization;Myofascial release    Soft tissue mobilization  along inferior breast scar in area of tightness and along right lateral trunk in area of tightness and pain using biotone    Myofascial Release  along right lateral trunk cross hands                  PT Long Term Goals - 01/25/18 1700      PT LONG TERM GOAL #1   Title  Pt will report her pain  and tightness in her right upper quadrant is decreased by 50% so that she can perfrom her activites of daily living easier     Time  4    Period  Weeks    Status  New    Target Date  02/22/18      PT LONG TERM GOAL #2   Title  Pt will be independent in a home exercise program  for continued stretching and scar mobilization     Time  4    Period  Weeks    Status  New    Target Date  02/22/18      PT LONG TERM GOAL #3   Title  Pt will report a 25% improvement in scar tissue in inferior right breast to allow improved comfort    Time  4    Period  Weeks    Status  New    Target Date  02/22/18      PT LONG TERM GOAL #4   Title  Pt will report a 50% improvement in neck and upper back pain to allow improved comfort especially at work    Time  4    Period  Weeks    Status  New    Target Date  02/22/18            Plan - 02/10/18 1649    Clinical Impression Statement  Continued with myofascial release and soft tissue mobilization to serratus anterior and along scar tissue. Pt has increased tightness today in serratus anterior. instructed pt in self massage using tennis ball to help decrease tightness in serratus as well as downward dog stretch to help stretch the muscle.     Rehab Potential  Good    Clinical Impairments Affecting Rehab Potential  7 nodes removed and radition  on the right     PT Frequency  2x / week    PT Duration  4 weeks    PT Treatment/Interventions  ADLs/Self Care Home Management;Therapeutic exercise;Therapeutic activities;Patient/family education;Manual techniques;Manual lymph drainage;Scar mobilization;Passive range of motion;Taping    PT Next Visit Plan  continue myofascial release to right lateral trunk and scar mobilization and instruct pt in scar mobilization, stretches for neck and upper back to decrease pain, refer to dry needling if pain still not improved    Consulted and Agree with Plan of Care  Patient       Patient will benefit from skilled therapeutic intervention in order to improve the following deficits and impairments:  Postural dysfunction, Decreased knowledge of precautions, Pain, Impaired UE functional use, Decreased range of motion, Increased muscle spasms, Decreased scar mobility  Visit Diagnosis: Disorder of the  skin and subcutaneous tissue related to radiation, unspecified  Other muscle spasm     Problem List Patient Active Problem List   Diagnosis Date Noted  . Family history of von Willebrand disease 06/21/2017  . Genetic testing 04/29/2017  . Family history of ovarian cancer   . Family history of colon cancer   . Acquired absence of breast and nipple 01/29/2017  . Breast cancer, right ([place]) 05/19/2016  . Malignant neoplasm of overlapping sites of right breast in female, estrogen receptor positive ([place]) 03/31/2016  . 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 Hydrosalpinx 10/25/2012  . Sciatica of right side 03/31/2012  . Routine general medical examination at a health care facility 08/27/2010  . SPONTANEOUS ECCHYMOSES 01/03/2009  . ANXIETY DISORDER 07/04/2007  . HYPOTHYROIDISM 06/23/2007  . HEMORRHOIDS 06/23/2007  . ESOPHAGEAL STRICTURE 06/23/2007  . GERD 06/23/2007  . DEGENERATIVE JOINT DISEASE 06/23/2007  . ARTHRITIS 06/23/2007  . SLEEP APNEA 06/23/2007    Allyson Sabal [facility] 02/10/2018, 4:51 PM  [place], [place], [address] Phone: [phone]   Fax:  [phone]  Name: GABRIELLIA REMPEL MRN: [id number] Date of Birth: 1960/05/14  Manus Gunning, PT 02/10/18 4:51 PM

## 2018-02-14 ENCOUNTER — Encounter: Payer: Self-pay | Admitting: Physical Therapy

## 2018-02-14 NOTE — Progress Notes (Signed)
[name]   [phone] [phone]   Clinic Follow up Note   Patient Care Team: Adria Dill Le[place] Reader, FNP as PCP - General (Internal Medicine) [place], Laurice Record, MD (Inactive) (Orthopedic Surgery) Rutherford Guys, MD (Ophthalmology) Lavonna Monarch, MD (Dermatology) Truitt Merle, MD as Consulting Physician (Hematology) Rolm Bookbinder, MD as Consulting Physician (General Surgery) Kyung Rudd, MD as Consulting Physician (Radiation Oncology) Gardenia Phlegm, NP as Nurse Practitioner (Hematology and Oncology) 02/17/2018  CHIEF COMPLAINT: F/u on right breast cancer   SUMMARY OF ONCOLOGIC HISTORY: Oncology History   Cancer Staging Malignant neoplasm of overlapping sites of right breast in female, estrogen receptor positive ([place]) Staging form: Breast, AJCC 8th Edition - Clinical stage from 03/23/2016: Stage IIA (cT3, cN1, cM0, G2, ER: Positive, PR: Positive, HER2: Negative) - Signed by Truitt Merle, MD on 03/31/2016 - Pathologic stage from 05/19/2016: Stage IA (pT2(m), pN1a, cM0, G1, ER: Positive, PR: Positive, HER2: Negative) - Signed by Truitt Merle, MD on 06/18/2016       Malignant neoplasm of overlapping sites of right breast in female, estrogen receptor positive ([place])   03/17/2016 Mammogram    B/l diagnostic mammogram and righ US showed a 3.6cm irregular mass in the right breast 11:00 position, posterior depth, there is a enlarged lymph node in the right axilla is highly suspicious for malignancy. additional 7 mm oval mass in the right breast lower outer quadrant is suspicious for malignancy.    03/23/2016 Initial Biopsy    Right breast 9:30 position biopsy showed invasive ductal carcinoma, grade 1. 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    03/23/2016 Receptors her2    Both breast and node biopsy tumor ER 100% positive, PR 70-95% positive, HER-2 negative, Ki-67 40%    03/23/2016 Initial Diagnosis    Malignant neoplasm  of upper-outer quadrant of right breast in female, estrogen receptor positive ([place])    03/25/2016 Initial Biopsy    Right breast 11:00 position core needle biopsy showed invasive duct carcinoma, grade 2.     03/25/2016 Receptors her2    ER 95% positive, PR 90% positive, HER-2 negative, Ki-67 15%    03/25/2016 Miscellaneous    Mammaprint showed low risk type A with index +0.105    03/30/2016 Imaging    Bilateral breast MRI with and without contrast showed a large lobulated enhancing mass within the upper-outer and lower outer right breast with surrounding nodularity, measuring 6.1 x 4.4 x 5.6 cm. Multiple critically sick and right axillary lymph nodes are demonstrated measuring up to 1.5 cm.     04/01/2016 -  Anti-estrogen oral therapy    Exemestane 25 mg daily, plan for 7 years. Switched to Anastrozole 41m in 06/2017 due to joint pain and hot flashes.    05/19/2016 Surgery    Bilateral mastectomy and right axillary regional lymph node resection.    05/19/2016 Pathology Results    -Right axillary regional lymph node resection revealed metastatic carcinoma in 2/7 lymph nodes. -Left simple mastectomy revealed lobular neoplasia and fibrocystic changes with adenosis and calcifications. 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 -Skin of the right mastectomy flap was benign. -mpT2, pN1a    06/25/2016 - 08/11/2016 Radiation Therapy    Site/dose:    1. 4 field Right breast was treated to 50.4 Gy in 25 fractions at 1.8 Gy per fraction. 2.  The Right breast was boosted to 10 Gy in 5 fractions at 2 Gy per fraction.    04/15/2017 Genetic Testing    The patient had genetic testing due to a personal history of breast cancer and family history of breast, ovarian, and colon cancer. The Common Hereditary  Cancer Panel was ordered.  The Common Hereditary Cancer Panel offered by [facility] includes sequencing and/or deletion duplication testing of the following 47 genes: APC, ATM, AXIN2, BARD1, BMPR1A, BRCA1, BRCA2, BRIP1, CDH1, CDKN2A (p14ARF), CDKN2A (p16INK4a), CKD4, CHEK2, CTNNA1, DICER1, EPCAM (Deletion/duplication testing only), GREM1 (promoter region deletion/duplication testing only), KIT, MEN1, MLH1, MSH2, MSH3, MSH6, MUTYH, NBN, NF1, NHTL1, PALB2, PDGFRA, PMS2, POLD1, POLE, PTEN, RAD50, RAD51C, RAD51D, SDHB, SDHC, SDHD, SMAD4, SMARCA4. STK11, TP53, TSC1, TSC2, and VHL.  The following genes were evaluated for sequence changes only: SDHA and HOXB13 c.251G>A variant only.  Results: Negative, no pathogenic variants identified. The date of this test report is 04/15/2017    06/28/2017 Surgery    COMPLEX REVISION OF BACK SCAR and LEFT BREAST RECONSTRUCTION WITH SILICONE IMPLANT EXCHANGE AND ACELLULARDERMIS TO LEFT CHEST by Dr. Iran Planas 06/28/17     CURRENT THERAPY Anastrozole 58m, started in 06/2017.    INTERVAL HISTORY: Katie DREESEis a 57y.o. female who is here for follow-up. Today, she is here with her husband. She is doing well, but experiences severe hot flashes that come multiple times a day. She can still sleep well. She takes Effexor for her mood swings, but it has not helped her hot flashes a lot. She also complains of joint pain, worse on shoulders and arms, and states that she currently attends PT. She relates some of her joint pain to scar tissue. She gets DOE. She takes Celebrex for her arthritis. She still works. She had a hysterectomy and therefore has no menses.    Pertinent positives and negatives of review of systems are listed and detailed within the above HPI.  REVIEW OF SYSTEMS:   Constitutional: Denies fevers, chills or abnormal weight loss (+) hot flashes  Eyes: Denies blurriness of vision Ears, nose, mouth, throat, and face: Denies mucositis or sore throat Respiratory:  Denies cough, dyspnea or wheezes (+) DOE Cardiovascular: Denies palpitation, chest discomfort or lower extremity swelling Gastrointestinal:  Denies nausea, heartburn or change in bowel habits Skin: Denies abnormal skin rashes Lymphatics: Denies new lymphadenopathy or easy bruising Neurological:Denies numbness, tingling or new weaknesses Behavioral/Psych: Mood is stable, no new changes (+) mood swings stable on Effexor  MSK: (+) shoulder and arm pain All other systems were reviewed with the patient and are negative.  MEDICAL HISTORY:  Past Medical History:  Diagnosis Date  . Anxiety   . Anxiety disorder   . Asthma    h/o asthma as a child  . Breast cancer [place]   . [place]   right breast cancer  . Cholelithiasis   . Chronic kidney disease    obstruction of R kidney, ( not a stone) - currently resolved   . Depression   . Diverticulosis   . DJD (degenerative joint disease)    hands & back   . Dyspnea    resolved since she stopped smoking   . Epigastric abdominal pain   . Esophageal stricture   . Family history of adverse reaction to anesthesia    daughter has N&V, takes long time to wake up   . Family history of colon cancer   . Family history of ovarian cancer   . Family  hx of colon cancer   . 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 Ganglion cyst   . GERD (gastroesophageal reflux disease)   . Headache    low grade currently , family history of migraines   . Hemorrhoid   . History of radiation therapy 05/03 - 08/11/2016   1. 4 field Right breast was treated to 50.4 Gy in 25 fractions at 1.8 Gy per fraction. 2. The Right breast was boosted to 10 Gy in 5 fractions at 2 Gy per fraction.  . Hypothyroidism   . Plantar fasciitis, bilateral   . PONV (postoperative nausea and vomiting)    gets anxious with the mask on her face, also remarks that the scop. patch has helped in the past       SURGICAL HISTORY: Past Surgical History:  Procedure Laterality Date   . ABDOMINAL HYSTERECTOMY    . BREAST RECONSTRUCTION WITH PLACEMENT OF TISSUE EXPANDER AND FLEX HD (ACELLULAR HYDRATED DERMIS) Bilateral 05/19/2016   Procedure: BREAST RECONSTRUCTION WITH PLACEMENT OF TISSUE EXPANDER AND ALLODERM PLACEMENT;  Surgeon: Irene Limbo, MD;  Location: [facility];  Service: Plastics;  Laterality: Bilateral;  . BREAST RECONSTRUCTION WITH PLACEMENT OF TISSUE EXPANDER AND FLEX HD (ACELLULAR HYDRATED DERMIS) Left 06/28/2017   Procedure: LEFT BREAST RECONSTRUCTION WITH SILICONE IMPLANT EXCHANGE AND ACELLULARDERMIS TO LEFT CHEST;  Surgeon: Irene Limbo, MD;  Location: [facility];  Service: Plastics;  Laterality: Left;  . CESAREAN SECTION  1988  . CHOLECYSTECTOMY    . HEMORRHOID SURGERY    . LAPAROSCOPIC BILATERAL SALPINGECTOMY N/A 10/26/2012   Procedure: operative laparoscopy with lysis of adhesions;  Surgeon: Thornell Sartorius, MD;  Location: [facility];  Service: Gynecology;  Laterality: N/A;  . MASTECTOMY Bilateral   . MASTECTOMY WITH RADIOACTIVE SEED GUIDED EXCISION AND AXILLARY SENTINEL LYMPH NODE BIOPSY Bilateral 05/19/2016   Procedure: RIGHT SKIN SPARING MASTECTOMY WITH RIGHT RADIOACTIVE SEED TARGETED DISSECTION AND RIGHT SENTINEL LYMPH NODE BIOPSY, LEFT PROPHYLACTIC SKIN SPARING MASTECTOMY;  Surgeon: Rolm Bookbinder, MD;  Location: [facility];  Service: General;  Laterality: Bilateral;  . REMOVAL OF BILATERAL TISSUE EXPANDERS WITH PLACEMENT OF BILATERAL BREAST IMPLANTS Bilateral 01/29/2017   Procedure: REMOVAL OF BILATERAL TISSUE EXPANDERS WITH PLACEMENT OF BILATERAL SILICONE BREAST IMPLANTS, ALLODERM TO LEFT BREAST RECONSTRUCTION;  RIGHT LATISSUMUS FLAP;  Surgeon: Irene Limbo, MD;  Location: [facility];  Service: Plastics;  Laterality: Bilateral;  Requesting RNFA  . SCAR REVISION Right 06/28/2017   Procedure: COMPLEX REVISION OF BACK SCAR;  Surgeon: Irene Limbo, MD;  Location: [facility];  Service: Plastics;   Laterality: Right;  . TONSILLECTOMY    . TUBAL LIGATION    . urologic surgery for ureteropelvic junction obstruction      I have reviewed the social history and family history with the patient and they are unchanged from previous note.  ALLERGIES:  is allergic to adhesive [tape] and promethazine hcl.  MEDICATIONS:  Current Outpatient Medications  Medication Sig Dispense Refill  . acetaminophen (TYLENOL) 500 MG tablet Take 1,000 mg by mouth every 6 (six) hours as needed.    . ALPRAZolam (XANAX) 1 MG tablet Take 1 tablet (1 mg total) by mouth 3 (three) times daily. 90 tablet 0  . anastrozole (ARIMIDEX) 1 MG tablet Take 1 tablet (1 mg total) by mouth daily. 90 tablet 1  . celecoxib (CELEBREX) 200 MG capsule Take 1 capsule (200 mg total) by mouth daily. 30 capsule 2  . DENTA 5000 PLUS 1.1 % CREA dental cream     .  docusate sodium (COLACE) 100 MG capsule Take 100 mg by mouth daily as needed for mild constipation.    Marland Kitchen HYDROcodone-acetaminophen (NORCO/VICODIN) 5-325 MG tablet Take 1-2 tablets by mouth every 4 (four) hours as needed for moderate pain. 40 tablet 0  . levothyroxine (SYNTHROID, LEVOTHROID) 50 MCG tablet Take 50 mcg by mouth daily before breakfast.    . methocarbamol (ROBAXIN) 500 MG tablet Take 1 tablet (500 mg total) by mouth every 8 (eight) hours as needed for muscle spasms. 30 tablet 0  . omeprazole (PRILOSEC) 40 MG capsule TAKE ONE CAPSULE BY MOUTH EVERY DAY 30 capsule 4  . venlafaxine XR (EFFEXOR-XR) 150 MG 24 hr capsule TAKE 1 CAPSULE (150 MG TOTAL) BY MOUTH DAILY WITH BREAKFAST. 90 capsule 1  . venlafaxine XR (EFFEXOR-XR) 75 MG 24 hr capsule Take 1 capsule (75 mg total) by mouth daily with breakfast. 90 capsule 2   No current facility-administered medications for this visit.     PHYSICAL EXAMINATION: ECOG PERFORMANCE STATUS: 0 - Asymptomatic  Vitals:   02/17/18 1040  BP: (!) 148/95  Pulse: 84  Resp: 17  Temp: 98.7 F (37.1 C)  SpO2: 100%   Filed Weights    02/17/18 1040  Weight: 177 lb (80.3 kg)    GENERAL:alert, no distress and comfortable SKIN: skin color, texture, turgor are normal, no rashes or significant lesions EYES: normal, Conjunctiva are pink and non-injected, sclera clear OROPHARYNX:no exudate, no erythema and lips, buccal mucosa, and tongue normal  NECK: supple, thyroid normal size, non-tender, without nodularity LYMPH:  no palpable lymphadenopathy in the cervical, axillary or inguinal LUNGS: clear to auscultation and percussion with normal breathing effort HEART: regular rate & rhythm and no murmurs and no lower extremity edema ABDOMEN:abdomen soft, non-tender and normal bowel sounds Musculoskeletal:no cyanosis of digits and no clubbing  NEURO: alert & oriented x 3 with fluent speech, no focal motor/sensory deficits Breast: (+) scar tissue and mild erythema on the right side at the site of previous shingles. No new skin changes or lumps   LABORATORY DATA:  I have reviewed the data as listed CBC Latest Ref Rng & Units 02/17/2018 10/18/2017 06/21/2017  WBC 4.0 - 10.5 K/uL 5.3 5.7 5.8  Hemoglobin 12.0 - 15.0 g/dL 12.5 12.5 11.9  Hematocrit 36.0 - 46.0 % 39.9 38.7 38.9  Platelets 150 - 400 K/uL 244 242 254     CMP Latest Ref Rng & Units 02/17/2018 10/18/2017 06/21/2017  Glucose 70 - 99 mg/dL 107(H) 110(H) 99  BUN 6 - 20 mg/dL '14 17 19  ' Creatinine 0.44 - 1.00 mg/dL 0.89 0.84 0.89  Sodium 135 - 145 mmol/L 142 141 139  Potassium 3.5 - 5.1 mmol/L 4.2 3.8 3.8  Chloride 98 - 111 mmol/L 105 106 104  CO2 22 - 32 mmol/L '29 26 25  ' Calcium 8.9 - 10.3 mg/dL 9.4 9.2 9.4  Total Protein 6.5 - 8.1 g/dL 6.8 6.7 6.8  Total Bilirubin 0.3 - 1.2 mg/dL 0.5 0.3 0.3  Alkaline Phos 38 - 126 U/L 116 117 101  AST 15 - 41 U/L '22 22 23  ' ALT 0 - 44 U/L '25 25 27      ' RADIOGRAPHIC STUDIES: I have personally reviewed the radiological images as listed and agreed with the findings in the report. No results found.   ASSESSMENT & PLAN:  ANGELIA HAZELL is a 57 y.o. female with history of  1.  Malignant new overlapping sites of right breast, mpT2 pN1a, stage IA, G1,  ER  positive, PR positive, HER-2 negative, mammaprint low risk luminal type A -Diagnosed in 02/2016. Treated with bilateral mastectomy, radiation, and antihormonal therapy. Exemestane was switched to Anastrozole 1 mg daily since 06/2017. Tolerating moderately well with severe hot flashes.  -Labs reviewed, CBC and CMP are overall WNLs.  -She is clinically stable, but complains of hot flashes and mild joint pain. -I discussed switching to Tamoxifen if her joint pain worsens. She currently takes Celebrex for the pain. Will continue Anastrozole for now and monitor.  -I advised her to continue DEXA scan every 2 years with PCP. -f/u in 4 months  2. Insomnia  -She was previously on Ambien, and attempted to take Benadryl. I previously advised her to discuss with her PCP.  3. Hot flashes -Currently on Effexor 225 mg daily. -Effexor has helped her mood swings but does not adequately control the hot flashes.  -She previously tried Gabapentin for shingles under her right breast. She was previously on 600 mg, which made her sleepy and not bale to work.  I discussed starting Gabapentin again for her hot flashes. She agrees. Will prescribe 100 mg, she will start at 100 mg at night, and gradually titrate up to 300 mg twice daily if needed.   4. Easy bruising -Reports history of easy bruising. No internal bleeding. Her sister was diagnosed with VWD. -VWF was normal. -mild and stable   Plan  -I prescribed Gabapentin 100 mg, she will titrate dose as needed for her hot flash -Continue Anastrozole -f/u in 4 months  No problem-specific Assessment & Plan notes found for this encounter.   No orders of the defined types were placed in this encounter.  All questions were answered. The patient knows to call the clinic with any problems, questions or concerns. No barriers to learning was  detected. I spent 20 minutes counseling the patient face to face. The total time spent in the appointment was 25 minutes and more than 50% was on counseling and review of test results  I, Noor Dweik am acting as scribe for Dr. Truitt Merle.  I have reviewed the above documentation for accuracy and completeness, and I agree with the above.     Truitt Merle, MD 02/17/2018

## 2018-02-17 ENCOUNTER — Inpatient Hospital Stay (HOSPITAL_BASED_OUTPATIENT_CLINIC_OR_DEPARTMENT_OTHER): Payer: BLUE CROSS/BLUE SHIELD | Admitting: Hematology

## 2018-02-17 ENCOUNTER — Inpatient Hospital Stay: Payer: BLUE CROSS/BLUE SHIELD | Attending: Hematology

## 2018-02-17 ENCOUNTER — Telehealth: Payer: Self-pay | Admitting: Hematology

## 2018-02-17 VITALS — BP 148/95 | HR 84 | Temp 98.7°F | Resp 17 | Ht 65.0 in | Wt 177.0 lb

## 2018-02-17 DIAGNOSIS — Z800 Family history of malignant neoplasm of digestive organs: Secondary | ICD-10-CM | POA: Diagnosis not present

## 2018-02-17 DIAGNOSIS — G4700 Insomnia, unspecified: Secondary | ICD-10-CM | POA: Insufficient documentation

## 2018-02-17 DIAGNOSIS — N951 Menopausal and female climacteric states: Secondary | ICD-10-CM | POA: Insufficient documentation

## 2018-02-17 DIAGNOSIS — C773 Secondary and unspecified malignant neoplasm of axilla and upper limb lymph nodes: Secondary | ICD-10-CM

## 2018-02-17 DIAGNOSIS — Z923 Personal history of irradiation: Secondary | ICD-10-CM

## 2018-02-17 DIAGNOSIS — Z170 Estrogen receptor positive status [ER+]: Secondary | ICD-10-CM | POA: Insufficient documentation

## 2018-02-17 DIAGNOSIS — Z79899 Other long term (current) drug therapy: Secondary | ICD-10-CM

## 2018-02-17 DIAGNOSIS — C50811 Malignant neoplasm of overlapping sites of right female breast: Secondary | ICD-10-CM

## 2018-02-17 DIAGNOSIS — Z8041 Family history of malignant neoplasm of ovary: Secondary | ICD-10-CM

## 2018-02-17 DIAGNOSIS — Z79811 Long term (current) use of aromatase inhibitors: Secondary | ICD-10-CM | POA: Diagnosis not present

## 2018-02-17 LAB — COMPREHENSIVE METABOLIC PANEL
ALK PHOS: 116 U/L (ref 38–126)
ALT: 25 U/L (ref 0–44)
ANION GAP: 8 (ref 5–15)
AST: 22 U/L (ref 15–41)
Albumin: 4 g/dL (ref 3.5–5.0)
BUN: 14 mg/dL (ref 6–20)
CALCIUM: 9.4 mg/dL (ref 8.9–10.3)
CO2: 29 mmol/L (ref 22–32)
Chloride: 105 mmol/L (ref 98–111)
Creatinine, Ser: 0.89 mg/dL (ref 0.44–1.00)
Glucose, Bld: 107 mg/dL — ABNORMAL HIGH (ref 70–99)
Potassium: 4.2 mmol/L (ref 3.5–5.1)
Sodium: 142 mmol/L (ref 135–145)
Total Bilirubin: 0.5 mg/dL (ref 0.3–1.2)
Total Protein: 6.8 g/dL (ref 6.5–8.1)

## 2018-02-17 LAB — CBC WITH DIFFERENTIAL/PLATELET
ABS IMMATURE GRANULOCYTES: 0.01 10*3/uL (ref 0.00–0.07)
BASOS PCT: 1 %
Basophils Absolute: 0.1 10*3/uL (ref 0.0–0.1)
EOS PCT: 3 %
Eosinophils Absolute: 0.2 10*3/uL (ref 0.0–0.5)
HCT: 39.9 % (ref 36.0–46.0)
HEMOGLOBIN: 12.5 g/dL (ref 12.0–15.0)
Immature Granulocytes: 0 %
Lymphocytes Relative: 31 %
Lymphs Abs: 1.7 10*3/uL (ref 0.7–4.0)
MCH: 27.1 pg (ref 26.0–34.0)
MCHC: 31.3 g/dL (ref 30.0–36.0)
MCV: 86.6 fL (ref 80.0–100.0)
MONO ABS: 0.5 10*3/uL (ref 0.1–1.0)
MONOS PCT: 10 %
NEUTROS ABS: 2.8 10*3/uL (ref 1.7–7.7)
Neutrophils Relative %: 55 %
PLATELETS: 244 10*3/uL (ref 150–400)
RBC: 4.61 MIL/uL (ref 3.87–5.11)
RDW: 16.8 % — ABNORMAL HIGH (ref 11.5–15.5)
WBC: 5.3 10*3/uL (ref 4.0–10.5)
nRBC: 0 % (ref 0.0–0.2)

## 2018-02-17 MED ORDER — GABAPENTIN 100 MG PO CAPS: 100.0000 mg | ORAL_CAPSULE | Freq: Every day | ORAL | 0 refills | Status: DC

## 2018-02-17 NOTE — Telephone Encounter (Signed)
Printed avs. °

## 2018-02-18 ENCOUNTER — Encounter: Payer: Self-pay | Admitting: Hematology

## 2018-02-18 IMAGING — MR MR BILATERAL BREAST WITHOUT AND WITH CONTRAST
8 of 12 series · 32 of 48 positions shown · IV contrast (15ml Multihance)
Comparison: Previous exam(s).

CLINICAL DATA: Patient with recent diagnosis of right breast
carcinoma with metastatic adenopathy. Multiple biopsy marking clips
(6) are demonstrated within the right breast. Additionally a biopsy
marking clip is demonstrated within the right axilla.

EXAM:
BILATERAL BREAST MRI WITH AND WITHOUT CONTRAST
TECHNIQUE: Multiplanar, multisequence MR images of both breasts were obtained
prior to and following the intravenous administration of 15 ml of
MultiHance.

[Series 2: t2_tirm_tra ipat (a-p) · axial · 3.0mm · 0.70mm/px · 1 of 55 slices shown]
[im 1/55]
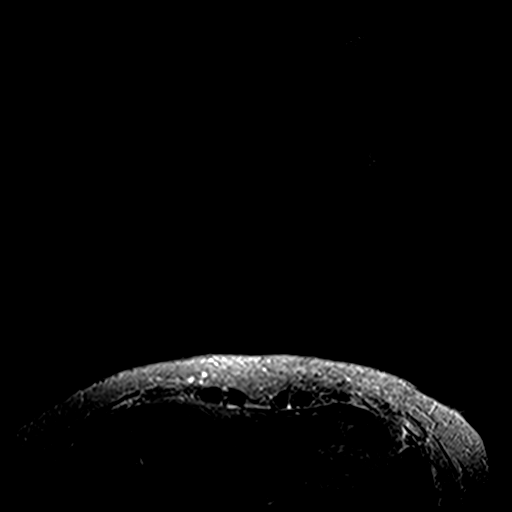

[Series 3: fl3d pre-cm no · axial · non-contrast · 1.2mm · 0.94mm/px · z∈[-86,+85]mm · 5 of 144 slices shown]
[im 1/144]
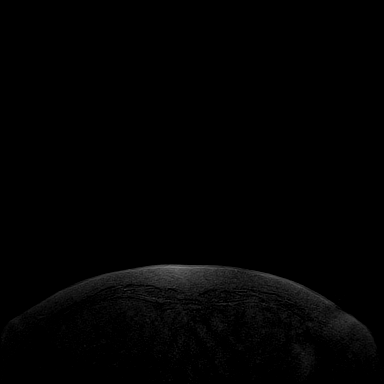
[im 36/144]
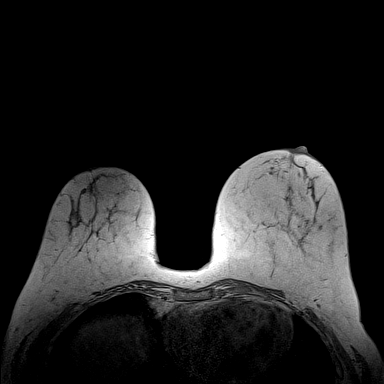
[im 72/144]
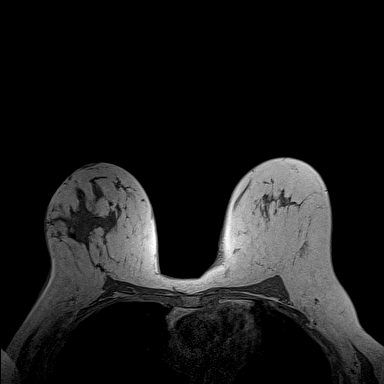
[im 108/144]
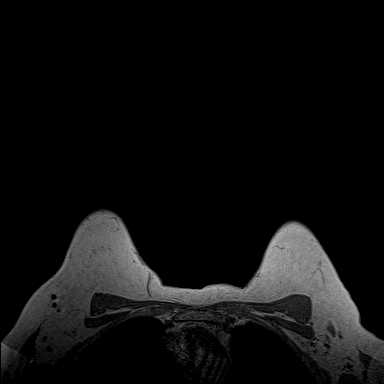
[im 144/144]
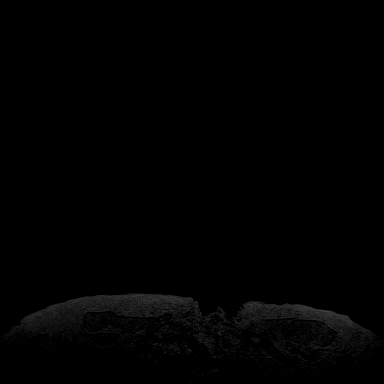

[Series 4: fl3d pre-cm · axial · non-contrast · 1.2mm · 0.94mm/px · z∈[-86,+85]mm · 5 of 144 slices shown]
[im 1/144]
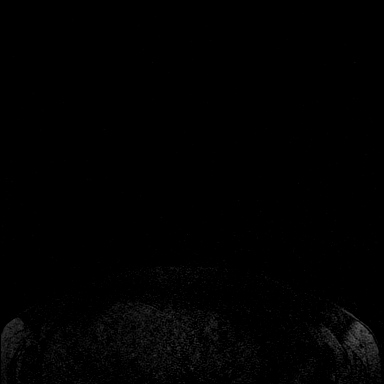
[im 36/144]
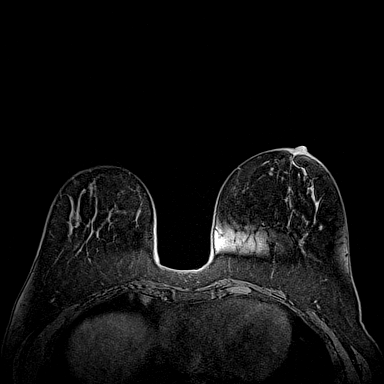
[im 72/144]
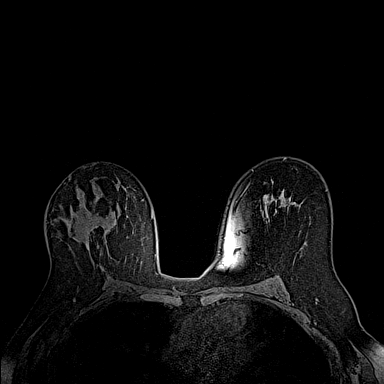
[im 108/144]
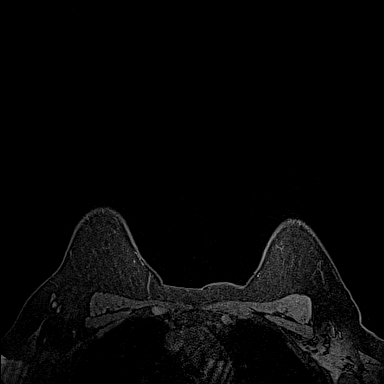
[im 144/144]
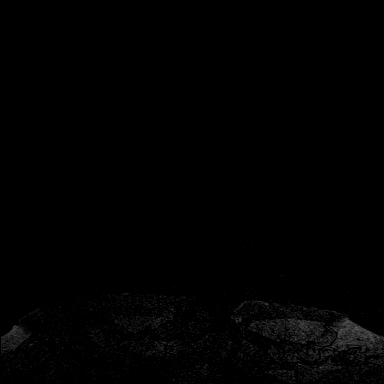

[Series 5: fl3d post-cm 20 · axial · 1.2mm · 0.94mm/px · z∈[-86,+85]mm · 5 of 144 slices shown (1 of 3)]
[im 1/144]
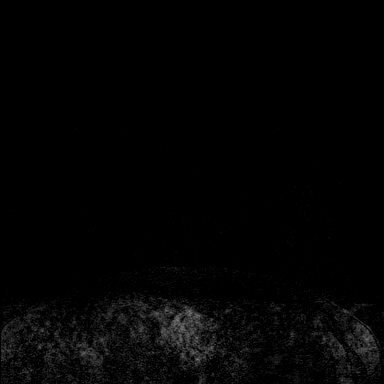
[im 36/144]
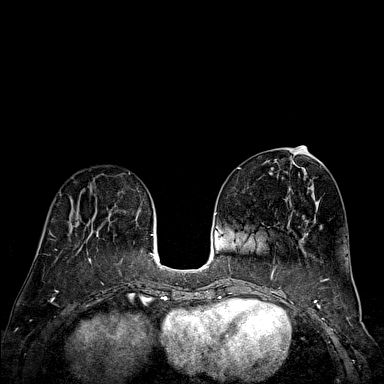
[im 72/144]
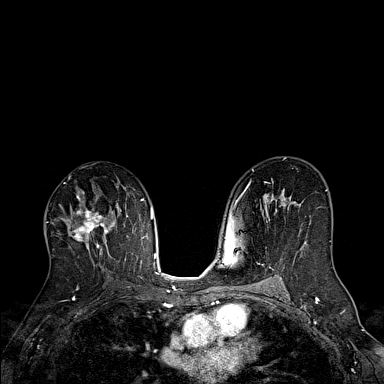
[im 108/144]
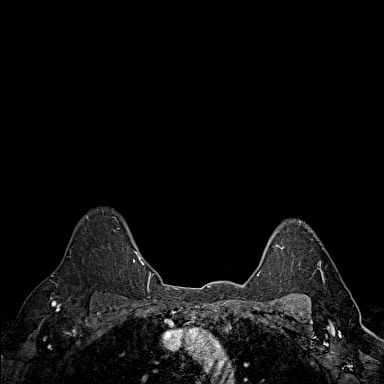
[im 144/144]
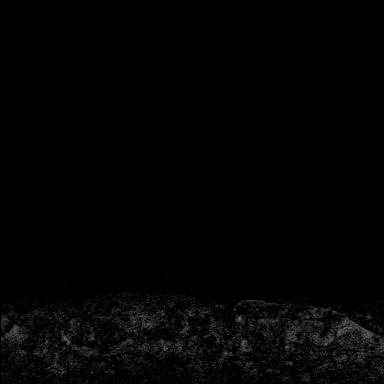

[Series 6: fl3d post-cm 20 · axial · 1.2mm · 0.94mm/px · z∈[-86,+85]mm · 5 of 144 slices shown (2 of 3)]
[im 1/144]
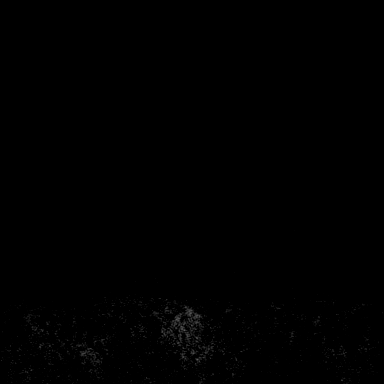
[im 36/144]
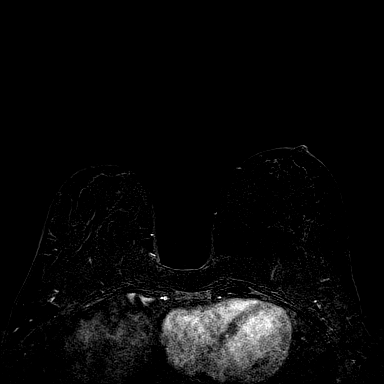
[im 72/144]
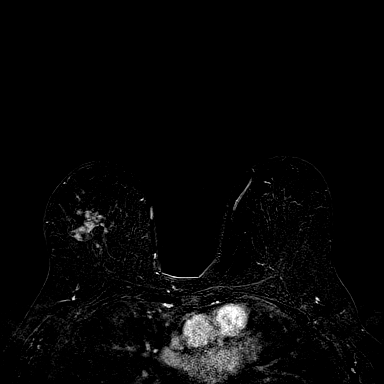
[im 108/144]
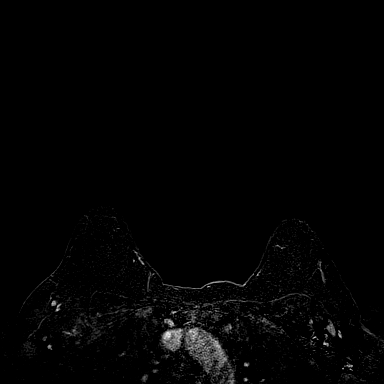
[im 144/144]
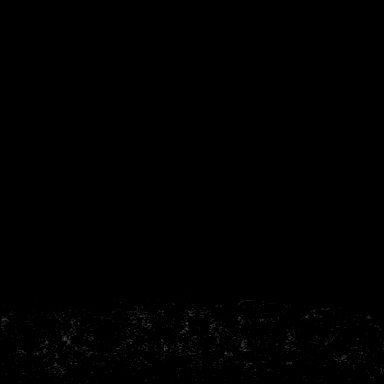

[Series 7: fl3d post-cm 20 · axial · 172.8mm · 0.94mm/px · 1 of 1 slices shown (3 of 3)]
[im 1/1]
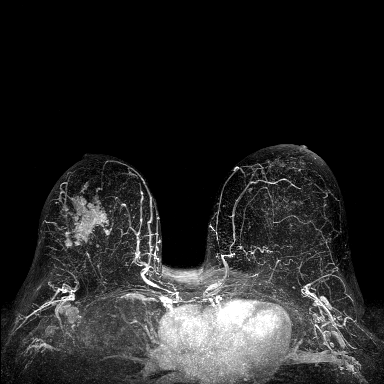

[Series 8: fl3d post-cm 3min · axial · 1.2mm · 0.94mm/px · z∈[-86,+85]mm · 6 of 144 slices shown]
[im 1/144]
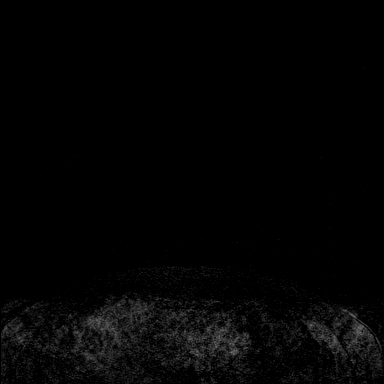
[im 29/144]
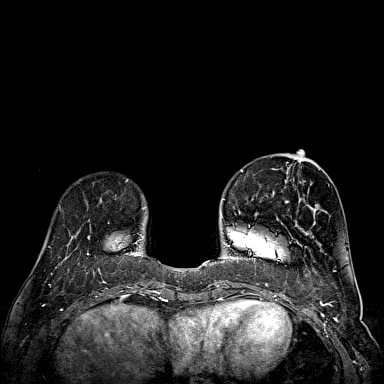
[im 58/144]
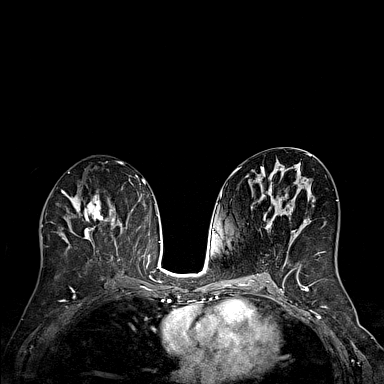
[im 86/144]
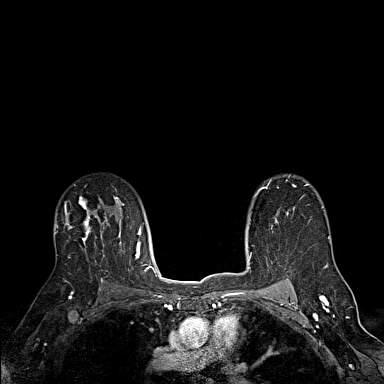
[im 115/144]
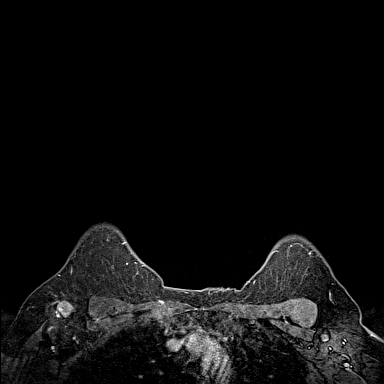
[im 144/144]
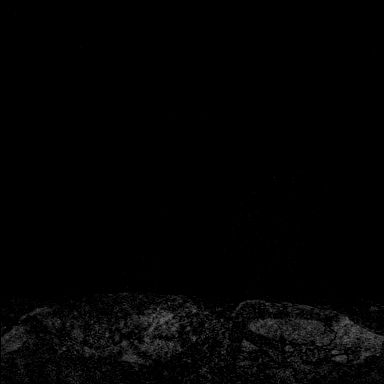

[Series 9: fl3d post-cm 3min_sub · axial · 1.2mm · 0.94mm/px · z∈[-86,+16]mm · 4 of 144 slices shown]
[im 1/144]
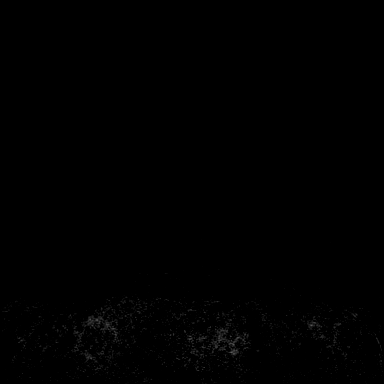
[im 29/144]
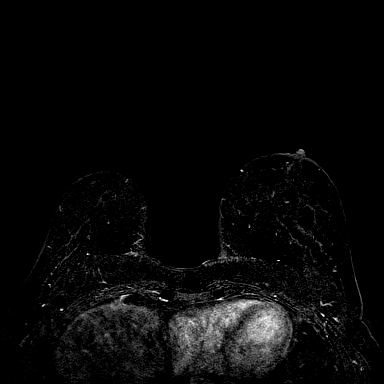
[im 58/144]
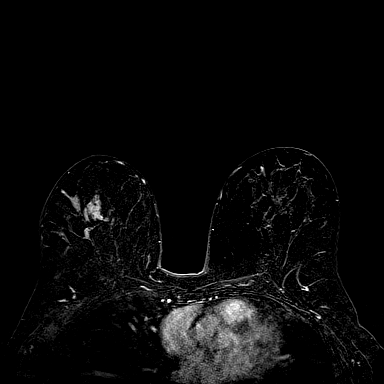
[im 86/144]
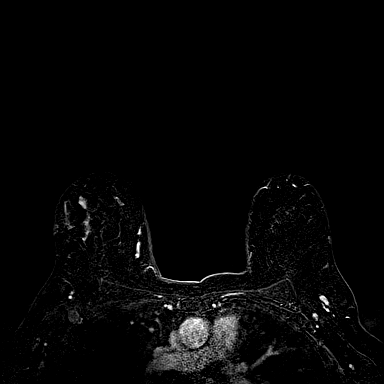

[32 of 48 positions shown; findings below may reference images not displayed]

THREE-DIMENSIONAL MR IMAGE RENDERING ON INDEPENDENT WORKSTATION:

Three-dimensional MR images were rendered by post-processing of the
original MR data on an independent workstation. The
three-dimensional MR images were interpreted, and findings are
reported in the following complete MRI report for this study. Three
dimensional images were evaluated at the independent [employer]
workstation
FINDINGS: Breast composition: b.  Scattered fibroglandular tissue.

Background parenchymal enhancement: Moderate.

Right breast: There is a large lobular enhancing mass within the
upper outer and lower outer right breast with multiple associated
bands of enhancement and surrounding nodules. In total this
irregular masslike area of enhancement with associated surrounding
nodularity measures 6.1 x 4.4 x 5.6 cm. Multiple areas of
susceptibility artifact are demonstrated throughout this masslike
area of enhancement compatible with biopsy marking clips. No
additional concerning areas enhancement identified within the right
breast.

Left breast: No mass or abnormal enhancement.

Lymph nodes: Multiple cortically thickened right axillary lymph
nodes are demonstrated measuring up to 1.5 cm (image 29; series 9).

Ancillary findings:  None.
IMPRESSION: Large irregular masslike area of enhancement within the upper outer
and lower outer right breast compatible with biopsy-proven
malignancy.

Enlarged right axillary lymph nodes compatible with metastatic
adenopathy.

RECOMMENDATION:
Treatment plan for known right breast malignancy.

BI-RADS CATEGORY  6: Known biopsy-proven malignancy.

## 2018-02-21 ENCOUNTER — Other Ambulatory Visit: Payer: BLUE CROSS/BLUE SHIELD

## 2018-02-21 ENCOUNTER — Ambulatory Visit: Payer: BLUE CROSS/BLUE SHIELD | Admitting: Hematology

## 2018-02-24 ENCOUNTER — Ambulatory Visit: Payer: BLUE CROSS/BLUE SHIELD | Attending: General Surgery | Admitting: Physical Therapy

## 2018-02-24 ENCOUNTER — Other Ambulatory Visit: Payer: Self-pay

## 2018-02-24 ENCOUNTER — Encounter: Payer: Self-pay | Admitting: Physical Therapy

## 2018-02-24 ENCOUNTER — Ambulatory Visit: Payer: BLUE CROSS/BLUE SHIELD | Admitting: Physical Therapy

## 2018-02-24 DIAGNOSIS — R293 Abnormal posture: Secondary | ICD-10-CM | POA: Diagnosis not present

## 2018-02-24 DIAGNOSIS — M62838 Other muscle spasm: Secondary | ICD-10-CM | POA: Insufficient documentation

## 2018-02-24 DIAGNOSIS — L599 Disorder of the skin and subcutaneous tissue related to radiation, unspecified: Secondary | ICD-10-CM | POA: Diagnosis not present

## 2018-02-24 DIAGNOSIS — M542 Cervicalgia: Secondary | ICD-10-CM | POA: Insufficient documentation

## 2018-02-24 DIAGNOSIS — M6281 Muscle weakness (generalized): Secondary | ICD-10-CM | POA: Insufficient documentation

## 2018-02-24 NOTE — Therapy (Signed)
[place], [place], [address] Phone: [phone]   Fax:  [phone]  Physical Therapy Treatment  Patient Details  Name: MINDA FAAS MRN: [id number] Date of Birth: February 20, 1961 Referring Provider (PT): Dr. Donne Hazel   Encounter Date: 02/24/2018  PT End of Session - 02/24/18 1651    Visit Number  5    Number of Visits  17    Date for PT Re-Evaluation  03/24/18    PT Start Time  1608    PT Stop Time  1646    PT Time Calculation (min)  38 min    Activity Tolerance  Patient tolerated treatment well    Behavior During Therapy  [facility] for tasks assessed/performed       Past Medical History:  Diagnosis Date  . Anxiety   . Anxiety disorder   . Asthma    h/o asthma as a child  . Breast cancer ([place])   . Cancer ([place]) 04/2016   right breast cancer  . Cholelithiasis   . Chronic kidney disease    obstruction of R kidney, ( not a stone) - currently resolved   . Depression   . Diverticulosis   . DJD (degenerative joint disease)    hands & back   . Dyspnea    resolved since she stopped smoking   . Epigastric abdominal pain   . Esophageal stricture   . Family history of adverse reaction to anesthesia    daughter has N&V, takes long time to wake up   . Family history of colon cancer   . Family history of ovarian cancer   . Family hx of colon cancer   . 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 Ganglion cyst   . GERD (gastroesophageal reflux disease)   . Headache    low grade currently , family history of migraines   . Hemorrhoid   . History of radiation therapy 05/03 - 08/11/2016   1. 4 field Right breast was treated to 50.4 Gy in 25 fractions at 1.8 Gy per fraction. 2. The Right breast was boosted to 10 Gy in 5 fractions at 2 Gy per fraction.  . Hypothyroidism   . Plantar fasciitis, bilateral   . PONV (postoperative nausea and vomiting)    gets anxious with the mask on her face, also remarks  that the scop. patch has helped in the past       Past Surgical History:  Procedure Laterality Date  . ABDOMINAL HYSTERECTOMY    . BREAST RECONSTRUCTION WITH PLACEMENT OF TISSUE EXPANDER AND FLEX HD (ACELLULAR HYDRATED DERMIS) Bilateral 05/19/2016   Procedure: BREAST RECONSTRUCTION WITH PLACEMENT OF TISSUE EXPANDER AND ALLODERM PLACEMENT;  Surgeon: Irene Limbo, MD;  Location: [place];  Service: Plastics;  Laterality: Bilateral;  . BREAST RECONSTRUCTION WITH PLACEMENT OF TISSUE EXPANDER AND FLEX HD (ACELLULAR HYDRATED DERMIS) Left 06/28/2017   Procedure: LEFT BREAST RECONSTRUCTION WITH SILICONE IMPLANT EXCHANGE AND ACELLULARDERMIS TO LEFT CHEST;  Surgeon: Irene Limbo, MD;  Location: [place];  Service: Plastics;  Laterality: Left;  . CESAREAN SECTION  1988  . CHOLECYSTECTOMY    . HEMORRHOID SURGERY    . LAPAROSCOPIC BILATERAL SALPINGECTOMY N/A 10/26/2012   Procedure: operative laparoscopy with lysis of adhesions;  Surgeon: Thornell Sartorius, MD;  Location: [facility];  Service: Gynecology;  Laterality: N/A;  . MASTECTOMY Bilateral   . MASTECTOMY WITH RADIOACTIVE SEED GUIDED EXCISION AND AXILLARY SENTINEL LYMPH NODE BIOPSY Bilateral 05/19/2016   Procedure:  RIGHT SKIN SPARING MASTECTOMY WITH RIGHT RADIOACTIVE SEED TARGETED DISSECTION AND RIGHT SENTINEL LYMPH NODE BIOPSY, LEFT PROPHYLACTIC SKIN SPARING MASTECTOMY;  Surgeon: Rolm Bookbinder, MD;  Location: [place];  Service: General;  Laterality: Bilateral;  . REMOVAL OF BILATERAL TISSUE EXPANDERS WITH PLACEMENT OF BILATERAL BREAST IMPLANTS Bilateral 01/29/2017   Procedure: REMOVAL OF BILATERAL TISSUE EXPANDERS WITH PLACEMENT OF BILATERAL SILICONE BREAST IMPLANTS, ALLODERM TO LEFT BREAST RECONSTRUCTION;  RIGHT LATISSUMUS FLAP;  Surgeon: Irene Limbo, MD;  Location: [place];  Service: Plastics;  Laterality: Bilateral;  Requesting RNFA  . SCAR REVISION Right 06/28/2017   Procedure: COMPLEX REVISION OF  BACK SCAR;  Surgeon: Irene Limbo, MD;  Location: [place];  Service: Plastics;  Laterality: Right;  . TONSILLECTOMY    . TUBAL LIGATION    . urologic surgery for ureteropelvic junction obstruction      There were no vitals filed for this visit.  Subjective Assessment - 02/24/18 1609    Subjective  I started gabapentin. I am feeling okay. I am still having the pain. The doctor thinks dry needling would help.     Pertinent History  right breast cancer, bilateral mastectomy and SLNB (7 or 8 and a few were positive), pt currently receiving chemotherapy and has completed radiation, she underwent lat flap reconstruction bilaterally, pt underwent a revision to back scar recently (Oct or Nov 2019) . Dad and sister have a rare blood disorder that pt is being evaluated    Patient Stated Goals  to have no pain    Currently in Pain?  Yes    Pain Score  6     Pain Orientation  Right                       [facility] Adult PT Treatment/Exercise - 02/24/18 0001      Manual Therapy   Manual Therapy  Soft tissue mobilization;Myofascial release    Soft tissue mobilization  along inferior breast scar in area of tightness and along right lateral trunk in area of tightness and pain using biotone                  PT Long Term Goals - 02/24/18 1613      PT LONG TERM GOAL #1   Title  Pt will report her pain  and tightness in her right upper quadrant is decreased by 50% so that she can perfrom her activites of daily living easier     Baseline  02/24/18- 25% improvement    Time  4    Period  Weeks    Status  On-going      PT LONG TERM GOAL #2   Title  Pt will be independent in a home exercise program for continued stretching and scar mobilization     Time  4    Period  Weeks    Status  On-going      PT LONG TERM GOAL #3   Title  Pt will report a 25% improvement in scar tissue in inferior right breast to allow improved comfort    Baseline  02/24/18- 50%  improvement    Time  4    Period  Weeks    Status  Achieved      PT LONG TERM GOAL #4   Title  Pt will report a 50% improvement in neck and upper back pain to allow improved comfort especially at work    Baseline  02/24/18- no improvement  Time  4    Period  Weeks    Status  On-going            Plan - 02/24/18 1652    Clinical Impression Statement  Pt has made some progress towards her goals in therapy but still is having a lot of pain from muscle tightness and scar tissue. She will feel better after her treatment sessions for a day or so and then her muscle pain comes back. Per pt, Dr. Burr Medico spoke with her and felt dry needling would be beneficial and this therapist agrees. Pt would benefit from continued skilled PT visits to include dry needling to decrease pain.     Rehab Potential  Good    Clinical Impairments Affecting Rehab Potential  7 nodes removed and radition  on the right     PT Frequency  2x / week    PT Duration  4 weeks    PT Treatment/Interventions  ADLs/Self Care Home Management;Therapeutic exercise;Therapeutic activities;Patient/family education;Manual techniques;Manual lymph drainage;Scar mobilization;Passive range of motion;Taping;Dry needling    PT Next Visit Plan  begin dry needling, continue myofascial release to right lateral trunk and scar mobilization and instruct pt in scar mobilization, stretches for neck and upper back to decrease pain    Consulted and Agree with Plan of Care  Patient       Patient will benefit from skilled therapeutic intervention in order to improve the following deficits and impairments:  Postural dysfunction, Decreased knowledge of precautions, Pain, Impaired UE functional use, Decreased range of motion, Increased muscle spasms, Decreased scar mobility  Visit Diagnosis: Disorder of the skin and subcutaneous tissue related to radiation, unspecified  Other muscle spasm  Cervicalgia  Abnormal posture  Muscle weakness  (generalized)     Problem List Patient Active Problem List   Diagnosis Date Noted  . Family history of von Willebrand disease 06/21/2017  . Genetic testing 04/29/2017  . Family history of ovarian cancer   . Family history of colon cancer   . Acquired absence of breast and nipple 01/29/2017  . Breast cancer, right ([place]) 05/19/2016  . Malignant neoplasm of overlapping sites of right breast in female, estrogen receptor positive ([place]) 03/31/2016  . 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 Hydrosalpinx 10/25/2012  . Sciatica of right side 03/31/2012  . Routine general medical examination at a health care facility 08/27/2010  . SPONTANEOUS ECCHYMOSES 01/03/2009  . ANXIETY DISORDER 07/04/2007  . HYPOTHYROIDISM 06/23/2007  . HEMORRHOIDS 06/23/2007  . ESOPHAGEAL STRICTURE 06/23/2007  . GERD 06/23/2007  . DEGENERATIVE JOINT DISEASE 06/23/2007  . ARTHRITIS 06/23/2007  . SLEEP APNEA 06/23/2007    Allyson Sabal [facility] 02/24/2018, 4:55 PM  [place], [place], [address] Phone: [phone]   Fax:  [phone]  Name: NELDA LUCKEY MRN: [id number] Date of Birth: [birth year]  Manus Gunning, PT 02/24/18 4:55 PM

## 2018-02-28 ENCOUNTER — Encounter: Payer: Self-pay | Admitting: Physical Therapy

## 2018-02-28 ENCOUNTER — Ambulatory Visit: Payer: BLUE CROSS/BLUE SHIELD | Admitting: Physical Therapy

## 2018-02-28 DIAGNOSIS — M6281 Muscle weakness (generalized): Secondary | ICD-10-CM | POA: Diagnosis not present

## 2018-02-28 DIAGNOSIS — L599 Disorder of the skin and subcutaneous tissue related to radiation, unspecified: Secondary | ICD-10-CM | POA: Diagnosis not present

## 2018-02-28 DIAGNOSIS — R293 Abnormal posture: Secondary | ICD-10-CM | POA: Diagnosis not present

## 2018-02-28 DIAGNOSIS — M542 Cervicalgia: Secondary | ICD-10-CM

## 2018-02-28 DIAGNOSIS — M62838 Other muscle spasm: Secondary | ICD-10-CM

## 2018-02-28 NOTE — Patient Instructions (Signed)

## 2018-02-28 NOTE — Therapy (Signed)
[name], [place], [address] Phone: [phone]   Fax:  [phone]  Physical Therapy Treatment  Patient Details  Name: [name] MRN: [id number] Date of Birth: Jun 28, 1960 Referring Provider (PT): Dr. Donne Hazel   Encounter Date: 02/28/2018  PT End of Session - 02/28/18 2059    Visit Number  6    Number of Visits  17    Date for PT Re-Evaluation  03/24/18    PT Start Time  1623    PT Stop Time  1714   11 min of time spent dry needling not included in skilled tx. time   PT Time Calculation (min)  51 min    Activity Tolerance  Patient tolerated treatment well    Behavior During Therapy  [facility] for tasks assessed/performed       Past Medical History:  Diagnosis Date  . Anxiety   . Anxiety disorder   . Asthma    h/o asthma as a child  . Breast cancer ([place])   . Cancer ([place]) 04/2016   right breast cancer  . Cholelithiasis   . Chronic kidney disease    obstruction of R kidney, ( not a stone) - currently resolved   . Depression   . Diverticulosis   . DJD (degenerative joint disease)    hands & back   . Dyspnea    resolved since she stopped smoking   . Epigastric abdominal pain   . Esophageal stricture   . Family history of adverse reaction to anesthesia    daughter has N&V, takes long time to wake up   . Family history of colon cancer   . Family history of ovarian cancer   . Family hx of colon cancer   . 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 Ganglion cyst   . GERD (gastroesophageal reflux disease)   . Headache    low grade currently , family history of migraines   . Hemorrhoid   . History of radiation therapy 05/03 - 08/11/2016   1. 4 field Right breast was treated to 50.4 Gy in 25 fractions at 1.8 Gy per fraction. 2. The Right breast was boosted to 10 Gy in 5 fractions at 2 Gy per fraction.  . Hypothyroidism   . Plantar fasciitis, bilateral   . PONV (postoperative nausea and  vomiting)    gets anxious with the mask on her face, also remarks that the scop. patch has helped in the past       Past Surgical History:  Procedure Laterality Date  . ABDOMINAL HYSTERECTOMY    . BREAST RECONSTRUCTION WITH PLACEMENT OF TISSUE EXPANDER AND FLEX HD (ACELLULAR HYDRATED DERMIS) Bilateral 05/19/2016   Procedure: BREAST RECONSTRUCTION WITH PLACEMENT OF TISSUE EXPANDER AND ALLODERM PLACEMENT;  Surgeon: Irene Limbo, MD;  Location: [place];  Service: Plastics;  Laterality: Bilateral;  . BREAST RECONSTRUCTION WITH PLACEMENT OF TISSUE EXPANDER AND FLEX HD (ACELLULAR HYDRATED DERMIS) Left 06/28/2017   Procedure: LEFT BREAST RECONSTRUCTION WITH SILICONE IMPLANT EXCHANGE AND ACELLULARDERMIS TO LEFT CHEST;  Surgeon: Irene Limbo, MD;  Location: [facility];  Service: Plastics;  Laterality: Left;  . CESAREAN SECTION  1988  . CHOLECYSTECTOMY    . HEMORRHOID SURGERY    . LAPAROSCOPIC BILATERAL SALPINGECTOMY N/A 10/26/2012   Procedure: operative laparoscopy with lysis of adhesions;  Surgeon: Thornell Sartorius, MD;  Location: [facility];  Service: Gynecology;  Laterality: N/A;  . MASTECTOMY Bilateral   . MASTECTOMY WITH RADIOACTIVE  SEED GUIDED EXCISION AND AXILLARY SENTINEL LYMPH NODE BIOPSY Bilateral 05/19/2016   Procedure: RIGHT SKIN SPARING MASTECTOMY WITH RIGHT RADIOACTIVE SEED TARGETED DISSECTION AND RIGHT SENTINEL LYMPH NODE BIOPSY, LEFT PROPHYLACTIC SKIN SPARING MASTECTOMY;  Surgeon: Rolm Bookbinder, MD;  Location: [place];  Service: General;  Laterality: Bilateral;  . REMOVAL OF BILATERAL TISSUE EXPANDERS WITH PLACEMENT OF BILATERAL BREAST IMPLANTS Bilateral 01/29/2017   Procedure: REMOVAL OF BILATERAL TISSUE EXPANDERS WITH PLACEMENT OF BILATERAL SILICONE BREAST IMPLANTS, ALLODERM TO LEFT BREAST RECONSTRUCTION;  RIGHT LATISSUMUS FLAP;  Surgeon: Irene Limbo, MD;  Location: [facility] [place];  Service: Plastics;  Laterality: Bilateral;  Requesting RNFA   . SCAR REVISION Right 06/28/2017   Procedure: COMPLEX REVISION OF BACK SCAR;  Surgeon: Irene Limbo, MD;  Location: [facility];  Service: Plastics;  Laterality: Right;  . TONSILLECTOMY    . TUBAL LIGATION    . urologic surgery for ureteropelvic junction obstruction      There were no vitals filed for this visit.  Subjective Assessment - 02/28/18 2051    Subjective  Pt. presents for visit for trial dry needling. Primary pain from under right breast region into right axillar region, also cervical/right upper trapezius region tension.    Currently in Pain?  Yes    Pain Score  6     Pain Location  --   under breast into right lateral trunk   Pain Orientation  Right    Pain Descriptors / Indicators  Tightness;Sharp;Burning    Pain Type  Chronic pain    Pain Onset  More than a month ago    Pain Frequency  Constant                       [facility] Adult PT Treatment/Exercise - 02/28/18 0001      Exercises   Exercises  Neck;Shoulder      Shoulder Exercises: Supine   Other Supine Exercises  serratus punch x 15 reps      Shoulder Exercises: Standing   Other Standing Exercises  closed chain serratus press at wall x 15 reps      Shoulder Exercises: Stretch   Other Shoulder Stretches  supine lat stretch 3x30 sec, see neck for other stretches      Manual Therapy   Manual Therapy  Soft tissue mobilization;Myofascial release    Soft tissue mobilization  right serratus, latissimus dorsi and posterior scapular region    Myofascial Release  right lateral trunk, serratus and lateral costal region      Neck Exercises: Stretches   Upper Trapezius Stretch  Right;3 reps;30 seconds    Corner Stretch  3 reps;30 seconds   in doorway   Chest Stretch  --   supine manual pec minor stretch 3x30 sec      Trigger Point Dry Needling - 02/28/18 2055    Consent Given?  Yes    Education Handout Provided  Yes    Muscles Treated Upper Body  Upper trapezius;Infraspinatus    also latissimus dorsi, serratus anterior          PT Education - 02/28/18 2059    Education Details  dry needling, POC    Person(s) Educated  Patient    Methods  Explanation;Handout    Comprehension  Verbalized understanding          PT Long Term Goals - 02/24/18 1613      PT LONG TERM GOAL #1   Title  Pt will report her pain  and tightness in her right upper quadrant is decreased by 50% so that she can perfrom her activites of daily living easier     Baseline  02/24/18- 25% improvement    Time  4    Period  Weeks    Status  On-going      PT LONG TERM GOAL #2   Title  Pt will be independent in a home exercise program for continued stretching and scar mobilization     Time  4    Period  Weeks    Status  On-going      PT LONG TERM GOAL #3   Title  Pt will report a 25% improvement in scar tissue in inferior right breast to allow improved comfort    Baseline  02/24/18- 50% improvement    Time  4    Period  Weeks    Status  Achieved      PT LONG TERM GOAL #4   Title  Pt will report a 50% improvement in neck and upper back pain to allow improved comfort especially at work    Baseline  02/24/18- no improvement    Time  4    Period  Weeks    Status  On-going            Plan - 02/28/18 2100    Clinical Impression Statement  Continued previous focus manual therapy with STM and myofascial release, added trial dry needling today -see flowsheet for details. Trial well-tolerated but given symptom duration and etiology expect could take several visits for tx. effect thus will await further response by next session.    PT Frequency  2x / week    PT Next Visit Plan  continue dry needling, continue myofascial release to right lateral trunk and scar mobilization and instruct pt in scar mobilization, stretches for neck and upper back to decrease pain    Consulted and Agree with Plan of Care  Patient       Patient will benefit from skilled therapeutic intervention in order to  improve the following deficits and impairments:  Postural dysfunction, Decreased knowledge of precautions, Pain, Impaired UE functional use, Decreased range of motion, Increased muscle spasms, Decreased scar mobility  Visit Diagnosis: Disorder of the skin and subcutaneous tissue related to radiation, unspecified  Other muscle spasm  Cervicalgia  Abnormal posture  Muscle weakness (generalized)     Problem List Patient Active Problem List   Diagnosis Date Noted  . Family history of von Willebrand disease 06/21/2017  . Genetic testing 04/29/2017  . Family history of ovarian cancer   . Family history of colon cancer   . Acquired absence of breast and nipple 01/29/2017  . Breast cancer, right ([place]) 05/19/2016  . Malignant neoplasm of overlapping sites of right breast in female, estrogen receptor positive ([place]) 03/31/2016  . 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 Hydrosalpinx 10/25/2012  . Sciatica of right side 03/31/2012  . Routine general medical examination at a health care facility 08/27/2010  . SPONTANEOUS ECCHYMOSES 01/03/2009  . ANXIETY DISORDER 07/04/2007  . HYPOTHYROIDISM 06/23/2007  . HEMORRHOIDS 06/23/2007  . ESOPHAGEAL STRICTURE 06/23/2007  . GERD 06/23/2007  . DEGENERATIVE JOINT DISEASE 06/23/2007  . ARTHRITIS 06/23/2007  . SLEEP APNEA 06/23/2007    Beaulah Dinning, PT, DPT 02/28/18 9:11 PM  [facility] [address] [place], [place], [address] Phone: [phone]   Fax:  [phone]  Name: ARIYANNA OIEN MRN: [id number] Date of Birth: 11-16-1960

## 2018-03-01 ENCOUNTER — Encounter: Payer: Self-pay | Admitting: Physical Therapy

## 2018-03-03 ENCOUNTER — Encounter: Payer: Self-pay | Admitting: Physical Therapy

## 2018-03-04 ENCOUNTER — Encounter

## 2018-03-05 ENCOUNTER — Other Ambulatory Visit: Payer: Self-pay | Admitting: Hematology

## 2018-03-07 ENCOUNTER — Encounter: Payer: BLUE CROSS/BLUE SHIELD | Admitting: Physical Therapy

## 2018-03-11 ENCOUNTER — Encounter

## 2018-03-14 ENCOUNTER — Ambulatory Visit: Payer: BLUE CROSS/BLUE SHIELD | Admitting: Physical Therapy

## 2018-03-14 ENCOUNTER — Encounter: Payer: Self-pay | Admitting: Physical Therapy

## 2018-03-14 DIAGNOSIS — M542 Cervicalgia: Secondary | ICD-10-CM

## 2018-03-14 DIAGNOSIS — M6281 Muscle weakness (generalized): Secondary | ICD-10-CM

## 2018-03-14 DIAGNOSIS — R293 Abnormal posture: Secondary | ICD-10-CM | POA: Diagnosis not present

## 2018-03-14 DIAGNOSIS — L599 Disorder of the skin and subcutaneous tissue related to radiation, unspecified: Secondary | ICD-10-CM | POA: Diagnosis not present

## 2018-03-14 DIAGNOSIS — M62838 Other muscle spasm: Secondary | ICD-10-CM | POA: Diagnosis not present

## 2018-03-14 NOTE — Therapy (Signed)
[place], [place], [address] Phone: [phone]   Fax:  [phone]  Physical Therapy Treatment  Patient Details  Name: [name] MRN: [id number] Date of Birth: 03/19/1960 Referring Provider (PT): Dr. Donne Hazel   Encounter Date: 03/14/2018  PT End of Session - 03/14/18 1728    Visit Number  7    Number of Visits  17    Date for PT Re-Evaluation  03/24/18    PT Start Time  8127    PT Stop Time  1628   direct timed minutes limited to 32 due to time spent dry needling   PT Time Calculation (min)  42 min    Activity Tolerance  Patient tolerated treatment well    Behavior During Therapy  [facility] for tasks assessed/performed       Past Medical History:  Diagnosis Date  . Anxiety   . Anxiety disorder   . Asthma    h/o asthma as a child  . Breast cancer ([place])   . Cancer ([place]) 04/2016   right breast cancer  . Cholelithiasis   . Chronic kidney disease    obstruction of R kidney, ( not a stone) - currently resolved   . Depression   . Diverticulosis   . DJD (degenerative joint disease)    hands & back   . Dyspnea    resolved since she stopped smoking   . Epigastric abdominal pain   . Esophageal stricture   . Family history of adverse reaction to anesthesia    daughter has N&V, takes long time to wake up   . Family history of colon cancer   . Family history of ovarian cancer   . Family hx of colon cancer   . 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 Ganglion cyst   . GERD (gastroesophageal reflux disease)   . Headache    low grade currently , family history of migraines   . Hemorrhoid   . History of radiation therapy 05/03 - 08/11/2016   1. 4 field Right breast was treated to 50.4 Gy in 25 fractions at 1.8 Gy per fraction. 2. The Right breast was boosted to 10 Gy in 5 fractions at 2 Gy per fraction.  . Hypothyroidism   . Plantar fasciitis, bilateral   . PONV (postoperative nausea and  vomiting)    gets anxious with the mask on her face, also remarks that the scop. patch has helped in the past       Past Surgical History:  Procedure Laterality Date  . ABDOMINAL HYSTERECTOMY    . BREAST RECONSTRUCTION WITH PLACEMENT OF TISSUE EXPANDER AND FLEX HD (ACELLULAR HYDRATED DERMIS) Bilateral 05/19/2016   Procedure: BREAST RECONSTRUCTION WITH PLACEMENT OF TISSUE EXPANDER AND ALLODERM PLACEMENT;  Surgeon: Irene Limbo, MD;  Location: [facility];  Service: Plastics;  Laterality: Bilateral;  . BREAST RECONSTRUCTION WITH PLACEMENT OF TISSUE EXPANDER AND FLEX HD (ACELLULAR HYDRATED DERMIS) Left 06/28/2017   Procedure: LEFT BREAST RECONSTRUCTION WITH SILICONE IMPLANT EXCHANGE AND ACELLULARDERMIS TO LEFT CHEST;  Surgeon: Irene Limbo, MD;  Location: [place];  Service: Plastics;  Laterality: Left;  . CESAREAN SECTION  1988  . CHOLECYSTECTOMY    . HEMORRHOID SURGERY    . LAPAROSCOPIC BILATERAL SALPINGECTOMY N/A 10/26/2012   Procedure: operative laparoscopy with lysis of adhesions;  Surgeon: Thornell Sartorius, MD;  Location: [facility];  Service: Gynecology;  Laterality: N/A;  . MASTECTOMY Bilateral   . MASTECTOMY WITH RADIOACTIVE SEED  GUIDED EXCISION AND AXILLARY SENTINEL LYMPH NODE BIOPSY Bilateral 05/19/2016   Procedure: RIGHT SKIN SPARING MASTECTOMY WITH RIGHT RADIOACTIVE SEED TARGETED DISSECTION AND RIGHT SENTINEL LYMPH NODE BIOPSY, LEFT PROPHYLACTIC SKIN SPARING MASTECTOMY;  Surgeon: Rolm Bookbinder, MD;  Location: [facility];  Service: General;  Laterality: Bilateral;  . REMOVAL OF BILATERAL TISSUE EXPANDERS WITH PLACEMENT OF BILATERAL BREAST IMPLANTS Bilateral 01/29/2017   Procedure: REMOVAL OF BILATERAL TISSUE EXPANDERS WITH PLACEMENT OF BILATERAL SILICONE BREAST IMPLANTS, ALLODERM TO LEFT BREAST RECONSTRUCTION;  RIGHT LATISSUMUS FLAP;  Surgeon: Irene Limbo, MD;  Location: [place];  Service: Plastics;  Laterality: Bilateral;  Requesting RNFA   . SCAR REVISION Right 06/28/2017   Procedure: COMPLEX REVISION OF BACK SCAR;  Surgeon: Irene Limbo, MD;  Location: [facility];  Service: Plastics;  Laterality: Right;  . TONSILLECTOMY    . TUBAL LIGATION    . urologic surgery for ureteropelvic junction obstruction      There were no vitals filed for this visit.  Subjective Assessment - 03/14/18 1724    Subjective  Pt. reports was sore for about a day after last session but otherwise tx. was well-tolerated. She reports mild improvement after treatment but continues with pain in right lateral costal, latissumus and serratus region.    Patient Stated Goals  to have no pain    Currently in Pain?  Yes    Pain Score  4     Pain Location  --   under breast, right lateral trunk   Pain Orientation  Right    Pain Descriptors / Indicators  Tightness;Sharp;Burning    Pain Type  Chronic pain    Pain Radiating Towards  radiates to arm    Pain Onset  More than a month ago    Pain Frequency  Constant    Aggravating Factors   overdoing it, excesssive activity    Pain Relieving Factors  sleeping, muscle relaxer    Effect of Pain on Daily Activities  limits tolerance for chores, ADLs, causes sleep disturbance                       [facility] Adult PT Treatment/Exercise - 03/14/18 0001      Exercises   Exercises  Neck;Shoulder      Shoulder Exercises: Stretch   Other Shoulder Stretches  lat stretch at high table seated on stool 30 sec x 3    Other Shoulder Stretches  right posterior shoulder/scapular stretch 20 sec x 3      Manual Therapy   Soft tissue mobilization  right serratus anterior, latissimus region, posterior scapular region,     Myofascial Release  right lateral costal region, serratus      Neck Exercises: Stretches   Upper Trapezius Stretch  Right;3 reps;30 seconds   supine manual stretch   Chest Stretch  --   supine manual stretch 30 sec x 3      Trigger Point Dry Needling - 03/14/18 1717     Consent Given?  Yes    Muscles Treated Upper Body  Upper trapezius;Infraspinatus   also right latissimus dorsi, serratus anterior   Upper Trapezius Response  Twitch reponse elicited    Infraspinatus Response  Twitch response elicited           PT Education - 03/14/18 1727    Education Details  POC, reviewed stretches and self trigger point release with Theracane    Person(s) Educated  Patient    Methods  Explanation;Demonstration  Comprehension  Verbalized understanding;Returned demonstration          PT Long Term Goals - 03/14/18 1731      PT LONG TERM GOAL #1   Title  Pt will report her pain  and tightness in her right upper quadrant is decreased by 50% so that she can perfrom her activites of daily living easier     Baseline  mild improvement 20-25%    Time  4    Period  Weeks    Status  On-going      PT LONG TERM GOAL #2   Title  Pt will be independent in a home exercise program for continued stretching and scar mobilization     Baseline  reviewed today    Time  4    Period  Weeks    Status  On-going      PT LONG TERM GOAL #3   Title  Pt will report a 25% improvement in scar tissue in inferior right breast to allow improved comfort    Baseline  02/24/18- 50% improvement    Time  4    Period  Weeks    Status  Achieved      PT LONG TERM GOAL #4   Title  Pt will report a 50% improvement in neck and upper back pain to allow improved comfort especially at work    Baseline  not met-goal ongoing    Time  4    Period  Weeks    Status  On-going            Plan - 03/14/18 1729    Clinical Impression Statement  Mild improvement after last session for decreased lateral trunk and costal + shoulder region myofascial pain s/p breast CA/reconstruction surgery. Given chronicity of symptoms expect progress will be ongoing/gradual. Pt. would benefit from continued PT for further progress with therapy goals/improving functional status.    Rehab Potential  Good     Clinical Impairments Affecting Rehab Potential  7 nodes removed and radition  on the right     PT Frequency  2x / week    PT Duration  4 weeks    PT Treatment/Interventions  ADLs/Self Care Home Management;Therapeutic exercise;Therapeutic activities;Patient/family education;Manual techniques;Manual lymph drainage;Scar mobilization;Passive range of motion;Taping;Dry needling    PT Next Visit Plan  continue dry needling, continue myofascial release to right lateral trunk and scar mobilization and instruct pt in scar mobilization, stretches for neck and upper back to decrease pain    Consulted and Agree with Plan of Care  Patient       Patient will benefit from skilled therapeutic intervention in order to improve the following deficits and impairments:  Postural dysfunction, Decreased knowledge of precautions, Pain, Impaired UE functional use, Decreased range of motion, Increased muscle spasms, Decreased scar mobility  Visit Diagnosis: Disorder of the skin and subcutaneous tissue related to radiation, unspecified  Other muscle spasm  Cervicalgia  Abnormal posture  Muscle weakness (generalized)     Problem List Patient Active Problem List   Diagnosis Date Noted  . Family history of von Willebrand disease 06/21/2017  . Genetic testing 04/29/2017  . Family history of ovarian cancer   . Family history of colon cancer   . Acquired absence of breast and nipple 01/29/2017  . Breast cancer, right ([place]) 05/19/2016  . Malignant neoplasm of overlapping sites of right breast in female, estrogen receptor positive ([name]) 03/31/2016  . 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 Hydrosalpinx 10/25/2012  .  Sciatica of right side 03/31/2012  . Routine general medical examination at a health care facility 08/27/2010  . SPONTANEOUS ECCHYMOSES 01/03/2009  . ANXIETY DISORDER 07/04/2007  . HYPOTHYROIDISM 06/23/2007  . HEMORRHOIDS 06/23/2007  . ESOPHAGEAL STRICTURE 06/23/2007  . GERD 06/23/2007   . DEGENERATIVE JOINT DISEASE 06/23/2007  . ARTHRITIS 06/23/2007  . SLEEP APNEA 06/23/2007    Beaulah Dinning, PT, DPT 03/14/18 5:35 PM  [facility] [address] [place], [place], [address] Phone: [phone]   Fax:  [phone]  Name: [name] MRN: [id number] Date of Birth: 03-31-1960

## 2018-03-17 ENCOUNTER — Encounter: Payer: BLUE CROSS/BLUE SHIELD | Admitting: Physical Therapy

## 2018-03-21 ENCOUNTER — Ambulatory Visit: Payer: BLUE CROSS/BLUE SHIELD | Admitting: Physical Therapy

## 2018-03-21 ENCOUNTER — Encounter: Payer: Self-pay | Admitting: Physical Therapy

## 2018-03-21 DIAGNOSIS — L599 Disorder of the skin and subcutaneous tissue related to radiation, unspecified: Secondary | ICD-10-CM | POA: Diagnosis not present

## 2018-03-21 DIAGNOSIS — M62838 Other muscle spasm: Secondary | ICD-10-CM

## 2018-03-21 DIAGNOSIS — R293 Abnormal posture: Secondary | ICD-10-CM | POA: Diagnosis not present

## 2018-03-21 DIAGNOSIS — M6281 Muscle weakness (generalized): Secondary | ICD-10-CM | POA: Diagnosis not present

## 2018-03-21 DIAGNOSIS — M542 Cervicalgia: Secondary | ICD-10-CM | POA: Diagnosis not present

## 2018-03-21 NOTE — Therapy (Signed)
[place], [place], [address] Phone: [phone]   Fax:  [phone]  Physical Therapy Treatment  Patient Details  Name: [name] MRN: [id number] Date of Birth: 08-Apr-1960 Referring Provider (PT): Dr. Donne Hazel   Encounter Date: 03/21/2018  PT End of Session - 03/21/18 1756    Visit Number  8    Number of Visits  17    Date for PT Re-Evaluation  03/24/18    PT Start Time  3762    PT Stop Time  1623    PT Time Calculation (min)  39 min    Activity Tolerance  Patient tolerated treatment well    Behavior During Therapy  [facility] for tasks assessed/performed       Past Medical History:  Diagnosis Date  . Anxiety   . Anxiety disorder   . Asthma    h/o asthma as a child  . Breast cancer ([place])   . Cancer ([place]) 04/2016   right breast cancer  . Cholelithiasis   . Chronic kidney disease    obstruction of R kidney, ( not a stone) - currently resolved   . Depression   . Diverticulosis   . DJD (degenerative joint disease)    hands & back   . Dyspnea    resolved since she stopped smoking   . Epigastric abdominal pain   . Esophageal stricture   . Family history of adverse reaction to anesthesia    daughter has N&V, takes long time to wake up   . Family history of colon cancer   . Family history of ovarian cancer   . Family hx of colon cancer   . 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 Ganglion cyst   . GERD (gastroesophageal reflux disease)   . Headache    low grade currently , family history of migraines   . Hemorrhoid   . History of radiation therapy 05/03 - 08/11/2016   1. 4 field Right breast was treated to 50.4 Gy in 25 fractions at 1.8 Gy per fraction. 2. The Right breast was boosted to 10 Gy in 5 fractions at 2 Gy per fraction.  . Hypothyroidism   . Plantar fasciitis, bilateral   . PONV (postoperative nausea and vomiting)    gets anxious with the mask on her face, also remarks  that the scop. patch has helped in the past       Past Surgical History:  Procedure Laterality Date  . ABDOMINAL HYSTERECTOMY    . BREAST RECONSTRUCTION WITH PLACEMENT OF TISSUE EXPANDER AND FLEX HD (ACELLULAR HYDRATED DERMIS) Bilateral 05/19/2016   Procedure: BREAST RECONSTRUCTION WITH PLACEMENT OF TISSUE EXPANDER AND ALLODERM PLACEMENT;  Surgeon: Irene Limbo, MD;  Location: [facility];  Service: Plastics;  Laterality: Bilateral;  . BREAST RECONSTRUCTION WITH PLACEMENT OF TISSUE EXPANDER AND FLEX HD (ACELLULAR HYDRATED DERMIS) Left 06/28/2017   Procedure: LEFT BREAST RECONSTRUCTION WITH SILICONE IMPLANT EXCHANGE AND ACELLULARDERMIS TO LEFT CHEST;  Surgeon: Irene Limbo, MD;  Location: [facility];  Service: Plastics;  Laterality: Left;  . CESAREAN SECTION  1988  . CHOLECYSTECTOMY    . HEMORRHOID SURGERY    . LAPAROSCOPIC BILATERAL SALPINGECTOMY N/A 10/26/2012   Procedure: operative laparoscopy with lysis of adhesions;  Surgeon: Thornell Sartorius, MD;  Location: [facility];  Service: Gynecology;  Laterality: N/A;  . MASTECTOMY Bilateral   . MASTECTOMY WITH RADIOACTIVE SEED GUIDED EXCISION AND AXILLARY SENTINEL LYMPH NODE BIOPSY Bilateral 05/19/2016   Procedure:  RIGHT SKIN SPARING MASTECTOMY WITH RIGHT RADIOACTIVE SEED TARGETED DISSECTION AND RIGHT SENTINEL LYMPH NODE BIOPSY, LEFT PROPHYLACTIC SKIN SPARING MASTECTOMY;  Surgeon: Rolm Bookbinder, MD;  Location: [place];  Service: General;  Laterality: Bilateral;  . REMOVAL OF BILATERAL TISSUE EXPANDERS WITH PLACEMENT OF BILATERAL BREAST IMPLANTS Bilateral 01/29/2017   Procedure: REMOVAL OF BILATERAL TISSUE EXPANDERS WITH PLACEMENT OF BILATERAL SILICONE BREAST IMPLANTS, ALLODERM TO LEFT BREAST RECONSTRUCTION;  RIGHT LATISSUMUS FLAP;  Surgeon: Irene Limbo, MD;  Location: [place];  Service: Plastics;  Laterality: Bilateral;  Requesting RNFA  . SCAR REVISION Right 06/28/2017   Procedure: COMPLEX REVISION OF  BACK SCAR;  Surgeon: Irene Limbo, MD;  Location: [place];  Service: Plastics;  Laterality: Right;  . TONSILLECTOMY    . TUBAL LIGATION    . urologic surgery for ureteropelvic junction obstruction      There were no vitals filed for this visit.  Subjective Assessment - 03/21/18 1737    Subjective  Pt. reports that she was very sore after last treatment (with soreness) lasting for several days in regions addressed with treatment and had bruise in right upper trapezius region. Afterwards she does report improvement in costal region pain but today notes significant pain in area of scar tissue at right breast. Due to soreness she requests to defer dry needling today.    Pertinent History  right breast cancer, bilateral mastectomy and SLNB (7 or 8 and a few were positive), pt currently receiving chemotherapy and has completed radiation, she underwent lat flap reconstruction bilaterally, pt underwent a revision to back scar recently (Oct or Nov 2019) . Dad and sister have a rare blood disorder that pt is being evaluated    Patient Stated Goals  to have no pain    Currently in Pain?  Yes    Pain Score  9     Pain Location  Breast    Pain Orientation  Right    Pain Descriptors / Indicators  Tightness    Pain Type  Chronic pain    Pain Radiating Towards  inferior aspect of breast/scar tissue region    Pain Onset  More than a month ago    Pain Frequency  Constant    Aggravating Factors   overdoing it, excessive activity    Pain Relieving Factors  sleeping, muscle relaxer    Effect of Pain on Daily Activities  limits tolerance for chores, ADLs, sleep disturbance                       [facility] Adult PT Treatment/Exercise - 03/21/18 0001      Shoulder Exercises: Standing   Other Standing Exercises  push up with a plus at wall x 15, "Cressy press" closed chain serratus press x 15 reps with foam roll at wall      Shoulder Exercises: Stretch   Corner Stretch  3  reps;20 seconds   in doorway     Manual Therapy   Manual Therapy  Manual Traction    Soft tissue mobilization  right serratus, latissimus, costal region in left sidelying, manual release right lat with passive shoulder flexion and extension in supine, supine manual release right upper trapezius    Manual Traction  gentle cervical manual traction      Neck Exercises: Stretches   Upper Trapezius Stretch  Right;3 reps;30 seconds   supine manual stretch            PT Education - 03/21/18 1755  Education Details  POC, soreness and potential for bruising with dry needling, unable to needle pec region given s/p reconstruction surgery    Person(s) Educated  Patient    Methods  Explanation    Comprehension  Verbalized understanding          PT Long Term Goals - 03/21/18 1801      PT LONG TERM GOAL #1   Title  Pt will report her pain  and tightness in her right upper quadrant is decreased by 50% so that she can perfrom her activites of daily living easier     Baseline  improved today in costal region but continues with breast/scar region pain    Time  4    Period  Weeks    Status  On-going    Target Date  03/24/18      PT LONG TERM GOAL #2   Title  Pt will be independent in a home exercise program for continued stretching and scar mobilization     Baseline  reviewed today    Time  4    Period  Weeks    Status  On-going    Target Date  03/24/18      PT LONG TERM GOAL #3   Title  Pt will report a 25% improvement in scar tissue in inferior right breast to allow improved comfort    Baseline  02/24/18- 50% improvement, exacerbation noted today    Time  4    Period  Weeks    Status  On-going    Target Date  03/24/18      PT LONG TERM GOAL #4   Title  Pt will report a 50% improvement in neck and upper back pain to allow improved comfort especially at work    Baseline  not met-goal ongoing    Time  4    Period  Weeks    Status  On-going    Target Date  03/24/18             Plan - 03/21/18 1757    Clinical Impression Statement  Though with significant soreness as noted in subjective/now resolved pt. did have subsequent improvement of costal region pain after last session. Some setback with exacerbation of scar region pain-emphasized ROM and stretches to help address. Plan await status at next visit to determine further plan of care.    Clinical Presentation  Stable    Rehab Potential  Good    Clinical Impairments Affecting Rehab Potential  7 nodes removed and radition  on the right     PT Frequency  2x / week    PT Duration  4 weeks    PT Treatment/Interventions  ADLs/Self Care Home Management;Therapeutic exercise;Therapeutic activities;Patient/family education;Manual techniques;Manual lymph drainage;Scar mobilization;Passive range of motion;Taping;Dry needling    PT Next Visit Plan  possible dry needling, pre pt. preference, assess need continued PT, for tx. otherwise continue myofascial release to right lateral trunk and scar mobilization and instruct pt in scar mobilization, stretches for neck and upper back to decrease pain    Consulted and Agree with Plan of Care  Patient       Patient will benefit from skilled therapeutic intervention in order to improve the following deficits and impairments:  Postural dysfunction, Decreased knowledge of precautions, Pain, Impaired UE functional use, Decreased range of motion, Increased muscle spasms, Decreased scar mobility  Visit Diagnosis: Disorder of the skin and subcutaneous tissue related to radiation, unspecified  Other muscle spasm  Cervicalgia  Abnormal posture  Muscle weakness (generalized)     Problem List Patient Active Problem List   Diagnosis Date Noted  . Family history of von Willebrand disease 06/21/2017  . Genetic testing 04/29/2017  . Family history of ovarian cancer   . Family history of colon cancer   . Acquired absence of breast and nipple 01/29/2017  . Breast cancer,  right ([place]) 05/19/2016  . Malignant neoplasm of overlapping sites of right breast in female, estrogen receptor positive ([place]) 03/31/2016  . 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 Hydrosalpinx 10/25/2012  . Sciatica of right side 03/31/2012  . Routine general medical examination at a health care facility 08/27/2010  . SPONTANEOUS ECCHYMOSES 01/03/2009  . ANXIETY DISORDER 07/04/2007  . HYPOTHYROIDISM 06/23/2007  . HEMORRHOIDS 06/23/2007  . ESOPHAGEAL STRICTURE 06/23/2007  . GERD 06/23/2007  . DEGENERATIVE JOINT DISEASE 06/23/2007  . ARTHRITIS 06/23/2007  . SLEEP APNEA 06/23/2007    Beaulah Dinning, PT, DPT 03/21/18 6:03 PM  [facility] [address] [place], [place], [address] Phone: [phone]   Fax:  [phone]  Name: [name] MRN: [id number] Date of Birth: [birth year]

## 2018-03-22 ENCOUNTER — Other Ambulatory Visit: Payer: Self-pay | Admitting: Hematology

## 2018-03-23 ENCOUNTER — Encounter: Payer: Self-pay | Admitting: Physical Therapy

## 2018-03-23 ENCOUNTER — Ambulatory Visit: Payer: BLUE CROSS/BLUE SHIELD | Admitting: Physical Therapy

## 2018-03-23 DIAGNOSIS — M6281 Muscle weakness (generalized): Secondary | ICD-10-CM | POA: Diagnosis not present

## 2018-03-23 DIAGNOSIS — L599 Disorder of the skin and subcutaneous tissue related to radiation, unspecified: Secondary | ICD-10-CM | POA: Diagnosis not present

## 2018-03-23 DIAGNOSIS — M62838 Other muscle spasm: Secondary | ICD-10-CM

## 2018-03-23 DIAGNOSIS — R293 Abnormal posture: Secondary | ICD-10-CM | POA: Diagnosis not present

## 2018-03-23 DIAGNOSIS — M542 Cervicalgia: Secondary | ICD-10-CM | POA: Diagnosis not present

## 2018-03-23 NOTE — Therapy (Addendum)
[facility] [place], [place], [address] Phone: [phone]   Fax:  [phone]  Physical Therapy Treatment  Patient Details  Name: [name] MRN: [id number] Date of Birth: 08/03/1960 Referring Provider (PT): Dr. Donne Hazel   Encounter Date: 03/23/2018  PT End of Session - 03/23/18 1916    Visit Number  9    Number of Visits  17    Date for PT Re-Evaluation  03/24/18    PT Start Time  1500    PT Stop Time  1543   skilled tx. time limited to 28 minutes for timed charges due to dry needling (not included in charges)   PT Time Calculation (min)  43 min    Activity Tolerance  Patient tolerated treatment well    Behavior During Therapy  [facility] for tasks assessed/performed       Past Medical History:  Diagnosis Date  . Anxiety   . Anxiety disorder   . Asthma    h/o asthma as a child  . Breast cancer ([place])   . Cancer ([place]) 04/2016   right breast cancer  . Cholelithiasis   . Chronic kidney disease    obstruction of R kidney, ( not a stone) - currently resolved   . Depression   . Diverticulosis   . DJD (degenerative joint disease)    hands & back   . Dyspnea    resolved since she stopped smoking   . Epigastric abdominal pain   . Esophageal stricture   . Family history of adverse reaction to anesthesia    daughter has N&V, takes long time to wake up   . Family history of colon cancer   . Family history of ovarian cancer   . Family hx of colon cancer   . 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 Ganglion cyst   . GERD (gastroesophageal reflux disease)   . Headache    low grade currently , family history of migraines   . Hemorrhoid   . History of radiation therapy 05/03 - 08/11/2016   1. 4 field Right breast was treated to 50.4 Gy in 25 fractions at 1.8 Gy per fraction. 2. The Right breast was boosted to 10 Gy in 5 fractions at 2 Gy per fraction.  . Hypothyroidism   . Plantar fasciitis, bilateral    . PONV (postoperative nausea and vomiting)    gets anxious with the mask on her face, also remarks that the scop. patch has helped in the past       Past Surgical History:  Procedure Laterality Date  . ABDOMINAL HYSTERECTOMY    . BREAST RECONSTRUCTION WITH PLACEMENT OF TISSUE EXPANDER AND FLEX HD (ACELLULAR HYDRATED DERMIS) Bilateral 05/19/2016   Procedure: BREAST RECONSTRUCTION WITH PLACEMENT OF TISSUE EXPANDER AND ALLODERM PLACEMENT;  Surgeon: Irene Limbo, MD;  Location: [facility];  Service: Plastics;  Laterality: Bilateral;  . BREAST RECONSTRUCTION WITH PLACEMENT OF TISSUE EXPANDER AND FLEX HD (ACELLULAR HYDRATED DERMIS) Left 06/28/2017   Procedure: LEFT BREAST RECONSTRUCTION WITH SILICONE IMPLANT EXCHANGE AND ACELLULARDERMIS TO LEFT CHEST;  Surgeon: Irene Limbo, MD;  Location: [facility];  Service: Plastics;  Laterality: Left;  . CESAREAN SECTION  1988  . CHOLECYSTECTOMY    . HEMORRHOID SURGERY    . LAPAROSCOPIC BILATERAL SALPINGECTOMY N/A 10/26/2012   Procedure: operative laparoscopy with lysis of adhesions;  Surgeon: Thornell Sartorius, MD;  Location: [facility];  Service: Gynecology;  Laterality: N/A;  . MASTECTOMY Bilateral   .  MASTECTOMY WITH RADIOACTIVE SEED GUIDED EXCISION AND AXILLARY SENTINEL LYMPH NODE BIOPSY Bilateral 05/19/2016   Procedure: RIGHT SKIN SPARING MASTECTOMY WITH RIGHT RADIOACTIVE SEED TARGETED DISSECTION AND RIGHT SENTINEL LYMPH NODE BIOPSY, LEFT PROPHYLACTIC SKIN SPARING MASTECTOMY;  Surgeon: Rolm Bookbinder, MD;  Location: [facility];  Service: General;  Laterality: Bilateral;  . REMOVAL OF BILATERAL TISSUE EXPANDERS WITH PLACEMENT OF BILATERAL BREAST IMPLANTS Bilateral 01/29/2017   Procedure: REMOVAL OF BILATERAL TISSUE EXPANDERS WITH PLACEMENT OF BILATERAL SILICONE BREAST IMPLANTS, ALLODERM TO LEFT BREAST RECONSTRUCTION;  RIGHT LATISSUMUS FLAP;  Surgeon: Irene Limbo, MD;  Location: [facility];  Service: Plastics;   Laterality: Bilateral;  Requesting RNFA  . SCAR REVISION Right 06/28/2017   Procedure: COMPLEX REVISION OF BACK SCAR;  Surgeon: Irene Limbo, MD;  Location: [facility];  Service: Plastics;  Laterality: Right;  . TONSILLECTOMY    . TUBAL LIGATION    . urologic surgery for ureteropelvic junction obstruction      There were no vitals filed for this visit.  Subjective Assessment - 03/23/18 1912    Subjective  Continues with ongoing pain in scar region right breast. Cervical and costal region pain improved but still present today.     Pertinent History  right breast cancer, bilateral mastectomy and SLNB (7 or 8 and a few were positive), pt currently receiving chemotherapy and has completed radiation, she underwent lat flap reconstruction bilaterally, pt underwent a revision to back scar recently (Oct or Nov 2019) . Dad and sister have a rare blood disorder that pt is being evaluated    Patient Stated Goals  to have no pain         [facility] PT Assessment - 03/23/18 0001      AROM   Overall AROM Comments  Right shoulder AROM grossly [facility]      Strength   Overall Strength  --   Right shoulder and elbow grossly 5/5                  [facility] Adult PT Treatment/Exercise - 03/23/18 0001      Shoulder Exercises: Stretch   Other Shoulder Stretches  supine manual right lat and pec stretches 3x20 sec ea.      Manual Therapy   Soft tissue mobilization  right serratus anterior and latissimus dorsi region    Myofascial Release  right serratus, latissimus, costal region       Trigger Point Dry Needling - 03/23/18 1913    Consent Given?  Yes    Muscles Treated Upper Body  Upper trapezius;Infraspinatus   Right latissimus dorsi, serratus anterior   Upper Trapezius Response  Twitch reponse elicited    Infraspinatus Response  Twitch response elicited           PT Education - 03/23/18 1916    Education Details  POC, etiology myofascial pain/trigger points     Person(s) Educated  Patient    Methods  Explanation    Comprehension  Verbalized understanding          PT Long Term Goals - 03/23/18 1920      PT LONG TERM GOAL #1   Title  Pt will report her pain  and tightness in her right upper quadrant is decreased by 50% so that she can perfrom her activites of daily living easier     Baseline  improved for cervical and costal region pain, continues with breast, scar region pain    Time  4    Period  Weeks  PT LONG TERM GOAL #2   Title  Pt will be independent in a home exercise program for continued stretching and scar mobilization     Baseline  met    Time  4    Period  Weeks    Status  On-going      PT LONG TERM GOAL #3   Title  Pt will report a 25% improvement in scar tissue in inferior right breast to allow improved comfort    Baseline  previously met but recently with exacerbation symptoms    Time  4    Period  Weeks    Status  Partially Met      PT LONG TERM GOAL #4   Title  Pt will report a 50% improvement in neck and upper back pain to allow improved comfort especially at work    Baseline  met    Time  4    Period  Weeks    Status  Achieved            Plan - 03/23/18 1917    Clinical Impression Statement  Fair status with therapy-improvement noted with costal region and cervical pain with addition of dry needling to plan of care, scar region pain for right breast ongoing.     Rehab Potential  Fair    Clinical Impairments Affecting Rehab Potential  7 nodes removed and radition  on the right, tx, efforts to date    PT Frequency  2x / week    PT Duration  4 weeks    PT Treatment/Interventions  ADLs/Self Care Home Management;Therapeutic exercise;Therapeutic activities;Patient/family education;Manual techniques;Manual lymph drainage;Scar mobilization;Passive range of motion;Taping;Dry needling    PT Next Visit Plan  continue dry needling, pre pt. preference, assess need continued PT, for tx. otherwise continue  myofascial release to right lateral trunk and scar mobilization and instruct pt in scar mobilization, stretches for neck and upper back to decrease pain    Consulted and Agree with Plan of Care  Patient       Patient will benefit from skilled therapeutic intervention in order to improve the following deficits and impairments:  Postural dysfunction, Decreased knowledge of precautions, Pain, Impaired UE functional use, Decreased range of motion, Increased muscle spasms, Decreased scar mobility  Visit Diagnosis: Disorder of the skin and subcutaneous tissue related to radiation, unspecified  Other muscle spasm  Cervicalgia  Abnormal posture     Problem List Patient Active Problem List   Diagnosis Date Noted  . Family history of von Willebrand disease 06/21/2017  . Genetic testing 04/29/2017  . Family history of ovarian cancer   . Family history of colon cancer   . Acquired absence of breast and nipple 01/29/2017  . Breast cancer, right ([place]) 05/19/2016  . Malignant neoplasm of overlapping sites of right breast in female, estrogen receptor positive ([place]) 03/31/2016  . 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 Hydrosalpinx 10/25/2012  . Sciatica of right side 03/31/2012  . Routine general medical examination at a health care facility 08/27/2010  . SPONTANEOUS ECCHYMOSES 01/03/2009  . ANXIETY DISORDER 07/04/2007  . HYPOTHYROIDISM 06/23/2007  . HEMORRHOIDS 06/23/2007  . ESOPHAGEAL STRICTURE 06/23/2007  . GERD 06/23/2007  . DEGENERATIVE JOINT DISEASE 06/23/2007  . ARTHRITIS 06/23/2007  . SLEEP APNEA 06/23/2007    Beaulah Dinning, PT, DPT 03/23/18 7:23 PM  [facility] [address] [place], [place], [address] Phone: [phone]   Fax:  [phone]  Name: [name] MRN: [id number] Date  of Birth: December 13, 1960  PHYSICAL THERAPY DISCHARGE SUMMARY  Visits from Start of Care: 9  Current functional level related  to goals / functional outcomes: Per phone call with pt she requests to be discharged from therapy. She did have relief with the dry needling and soft tissue mobilization but reports that it is only temporary.     Remaining deficits: Pt still has tightness   Education / Equipment: HEP Plan: Patient agrees to discharge.  Patient goals were partially met. Patient is being discharged due to the patient's request.  ?????     [facility] [place], [place] 04/28/18 9:15 AM

## 2018-03-25 ENCOUNTER — Telehealth: Payer: Self-pay | Admitting: Physical Therapy

## 2018-03-25 NOTE — Telephone Encounter (Signed)
Called and left message regarding clinic dry needling policy per conversation at last therapy visit (left voicemail). Patient to call back and schedule if wishing to continue with further therapy.

## 2018-03-31 ENCOUNTER — Other Ambulatory Visit: Payer: Self-pay | Admitting: Hematology

## 2018-04-03 ENCOUNTER — Ambulatory Visit (HOSPITAL_COMMUNITY)
Admission: EM | Admit: 2018-04-03 | Discharge: 2018-04-03 | Disposition: A | Discharge status: Home / Self Care | Payer: BLUE CROSS/BLUE SHIELD | Attending: Internal Medicine | Admitting: Internal Medicine

## 2018-04-03 ENCOUNTER — Encounter (HOSPITAL_COMMUNITY): Payer: Self-pay | Admitting: Emergency Medicine

## 2018-04-03 ENCOUNTER — Other Ambulatory Visit: Payer: Self-pay

## 2018-04-03 DIAGNOSIS — G44209 Tension-type headache, unspecified, not intractable: Secondary | ICD-10-CM

## 2018-04-03 DIAGNOSIS — J069 Acute upper respiratory infection, unspecified: Secondary | ICD-10-CM

## 2018-04-03 DIAGNOSIS — B9789 Other viral agents as the cause of diseases classified elsewhere: Secondary | ICD-10-CM | POA: Diagnosis not present

## 2018-04-03 MED ORDER — CETIRIZINE HCL 10 MG PO CAPS: 10.0000 mg | ORAL_CAPSULE | Freq: Every day | ORAL | 0 refills | Status: DC

## 2018-04-03 MED ORDER — DEXAMETHASONE SODIUM PHOSPHATE 10 MG/ML IJ SOLN
INTRAMUSCULAR | Status: AC
Start: 2018-04-03 — End: ?
  Filled 2018-04-03: qty 1

## 2018-04-03 MED ORDER — KETOROLAC TROMETHAMINE 60 MG/2ML IM SOLN
60.0000 mg | Freq: Once | INTRAMUSCULAR | Status: AC
  Administered 2018-04-03: 60 mg via INTRAMUSCULAR

## 2018-04-03 MED ORDER — FLUTICASONE PROPIONATE 50 MCG/ACT NA SUSP: 1.0000 | Freq: Every day | NASAL | 0 refills | Status: DC

## 2018-04-03 MED ORDER — PREDNISONE 50 MG PO TABS: 50.0000 mg | ORAL_TABLET | Freq: Every day | ORAL | 0 refills | Status: AC

## 2018-04-03 MED ORDER — DEXAMETHASONE SODIUM PHOSPHATE 10 MG/ML IJ SOLN
10.0000 mg | Freq: Once | INTRAMUSCULAR | Status: AC
  Administered 2018-04-03: 10 mg via INTRAMUSCULAR

## 2018-04-03 MED ORDER — ALBUTEROL SULFATE HFA 108 (90 BASE) MCG/ACT IN AERS
1.0000 | INHALATION_SPRAY | Freq: Four times a day (QID) | RESPIRATORY_TRACT | 0 refills | Status: DC | PRN
Start: 2018-04-03 — End: 2018-10-14

## 2018-04-03 MED ORDER — BENZONATATE 200 MG PO CAPS: 200.0000 mg | ORAL_CAPSULE | Freq: Three times a day (TID) | ORAL | 0 refills | Status: AC | PRN

## 2018-04-03 MED ORDER — KETOROLAC TROMETHAMINE 60 MG/2ML IM SOLN
INTRAMUSCULAR | Status: AC
  Filled 2018-04-03: qty 2

## 2018-04-03 NOTE — ED Provider Notes (Signed)
[employer]    CSN: [id number] Arrival date & time: 04/03/18  1037     History   Chief Complaint Chief Complaint  Patient presents with  . Cough    HPI [name] is a 58 y.o. female history of asthma, anxiety, breast cancer, presenting today for evaluation of headache and URI symptoms.  Patient states that she has had a headache and neck pain over the past week.  More recently over the past 2 to 3 days she has developed cough, congestion, sore throat and ear discomfort.  Headache feels like a typical migraine, but is lasting longer than normal.  She does not have associated nausea or vomiting.  Has had some mild eye itching, but denies changes in vision.  She took a second June for headache with mild relief.  Headache is located behind both eyes.  She denies any injury to her neck.  Notes that she has been going to physical therapy and recently had dry needling done to her neck.  Of recently she also had sore throat and URI symptoms has caused her to have poor sleep of recently.  Notes that many of her grandchildren have been sick with various illnesses.  She has tried Alka-Seltzer for the symptoms.  Has a history of previous smoking, quit 2 to 3 years ago but gets occasional bronchitis.  She has started to notice symptoms today to her chest and has had some slight wheezing.  Denies fevers.  HPI  Past Medical History:  Diagnosis Date  . Anxiety   . Anxiety disorder   . Asthma    h/o asthma as a child  . Breast cancer ([place])   . Cancer ([place]) 04/2016   right breast cancer  . Cholelithiasis   . Chronic kidney disease    obstruction of R kidney, ( not a stone) - currently resolved   . Depression   . Diverticulosis   . DJD (degenerative joint disease)    hands & back   . Dyspnea    resolved since she stopped smoking   . Epigastric abdominal pain   . Esophageal stricture   . Family history of adverse reaction to anesthesia    daughter has N&V, takes long time to  wake up   . Family history of colon cancer   . Family history of ovarian cancer   . Family hx of colon cancer   . 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 Ganglion cyst   . GERD (gastroesophageal reflux disease)   . Headache    low grade currently , family history of migraines   . Hemorrhoid   . History of radiation therapy 05/03 - 08/11/2016   1. 4 field Right breast was treated to 50.4 Gy in 25 fractions at 1.8 Gy per fraction. 2. The Right breast was boosted to 10 Gy in 5 fractions at 2 Gy per fraction.  . Hypothyroidism   . Plantar fasciitis, bilateral   . PONV (postoperative nausea and vomiting)    gets anxious with the mask on her face, also remarks that the scop. patch has helped in the past       Patient Active Problem List   Diagnosis Date Noted  . Family history of von Willebrand disease 06/21/2017  . Genetic testing 04/29/2017  . Family history of ovarian cancer   . Family history of colon cancer   . Acquired absence of breast and nipple 01/29/2017  . Breast cancer, right ([place]) 05/19/2016  . Malignant  neoplasm of overlapping sites of right breast in female, estrogen receptor positive ([place]) 03/31/2016  . 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 Hydrosalpinx 10/25/2012  . Sciatica of right side 03/31/2012  . Routine general medical examination at a health care facility 08/27/2010  . SPONTANEOUS ECCHYMOSES 01/03/2009  . ANXIETY DISORDER 07/04/2007  . HYPOTHYROIDISM 06/23/2007  . HEMORRHOIDS 06/23/2007  . ESOPHAGEAL STRICTURE 06/23/2007  . GERD 06/23/2007  . DEGENERATIVE JOINT DISEASE 06/23/2007  . ARTHRITIS 06/23/2007  . SLEEP APNEA 06/23/2007    Past Surgical History:  Procedure Laterality Date  . ABDOMINAL HYSTERECTOMY    . BREAST RECONSTRUCTION WITH PLACEMENT OF TISSUE EXPANDER AND FLEX HD (ACELLULAR HYDRATED DERMIS) Bilateral 05/19/2016   Procedure: BREAST RECONSTRUCTION WITH PLACEMENT OF TISSUE EXPANDER AND ALLODERM PLACEMENT;  Surgeon: Irene Limbo, MD;  Location: [facility];  Service: Plastics;  Laterality: Bilateral;  . BREAST RECONSTRUCTION WITH PLACEMENT OF TISSUE EXPANDER AND FLEX HD (ACELLULAR HYDRATED DERMIS) Left 06/28/2017   Procedure: LEFT BREAST RECONSTRUCTION WITH SILICONE IMPLANT EXCHANGE AND ACELLULARDERMIS TO LEFT CHEST;  Surgeon: Irene Limbo, MD;  Location: [facility];  Service: Plastics;  Laterality: Left;  . CESAREAN SECTION  1988  . CHOLECYSTECTOMY    . HEMORRHOID SURGERY    . LAPAROSCOPIC BILATERAL SALPINGECTOMY N/A 10/26/2012   Procedure: operative laparoscopy with lysis of adhesions;  Surgeon: Thornell Sartorius, MD;  Location: [facility];  Service: Gynecology;  Laterality: N/A;  . MASTECTOMY Bilateral   . MASTECTOMY WITH RADIOACTIVE SEED GUIDED EXCISION AND AXILLARY SENTINEL LYMPH NODE BIOPSY Bilateral 05/19/2016   Procedure: RIGHT SKIN SPARING MASTECTOMY WITH RIGHT RADIOACTIVE SEED TARGETED DISSECTION AND RIGHT SENTINEL LYMPH NODE BIOPSY, LEFT PROPHYLACTIC SKIN SPARING MASTECTOMY;  Surgeon: Rolm Bookbinder, MD;  Location: [facility];  Service: General;  Laterality: Bilateral;  . REMOVAL OF BILATERAL TISSUE EXPANDERS WITH PLACEMENT OF BILATERAL BREAST IMPLANTS Bilateral 01/29/2017   Procedure: REMOVAL OF BILATERAL TISSUE EXPANDERS WITH PLACEMENT OF BILATERAL SILICONE BREAST IMPLANTS, ALLODERM TO LEFT BREAST RECONSTRUCTION;  RIGHT LATISSUMUS FLAP;  Surgeon: Irene Limbo, MD;  Location: [facility];  Service: Plastics;  Laterality: Bilateral;  Requesting RNFA  . SCAR REVISION Right 06/28/2017   Procedure: COMPLEX REVISION OF BACK SCAR;  Surgeon: Irene Limbo, MD;  Location: [facility];  Service: Plastics;  Laterality: Right;  . TONSILLECTOMY    . TUBAL LIGATION    . urologic surgery for ureteropelvic junction obstruction      OB History   No obstetric history on file.      Home Medications    Prior to Admission medications   Medication Sig Start  Date End Date Taking? Authorizing Provider  acetaminophen (TYLENOL) 500 MG tablet Take 1,000 mg by mouth every 6 (six) hours as needed.    [provider]  albuterol (PROVENTIL HFA;VENTOLIN HFA) 108 (90 Base) MCG/ACT inhaler Inhale 1-2 puffs into the lungs every 6 (six) hours as needed for wheezing or shortness of breath. 04/03/18   Wieters, Hallie C, PA-C  ALPRAZolam (XANAX) 1 MG tablet Take 1 tablet (1 mg total) by mouth 3 (three) times daily. 10/31/13   Zenaida Niece, PA-C  anastrozole (ARIMIDEX) 1 MG tablet Take 1 tablet (1 mg total) by mouth daily. 10/18/17   Truitt Merle, MD  benzonatate (TESSALON) 200 MG capsule Take 1 capsule (200 mg total) by mouth 3 (three) times daily as needed for up to 7 days for cough. 04/03/18 04/10/18  Wieters, Hallie C, PA-C  celecoxib (CELEBREX) 200 MG capsule  Take 1 capsule (200 mg total) by mouth daily. 10/31/13   Zenaida Niece, PA-C  Cetirizine HCl 10 MG CAPS Take 1 capsule (10 mg total) by mouth daily for 10 days. 04/03/18 04/13/18  Wieters, Hallie C, PA-C  DENTA 5000 PLUS 1.1 % CREA dental cream  02/12/17   [provider]  docusate sodium (COLACE) 100 MG capsule Take 100 mg by mouth daily as needed for mild constipation.    [provider]  fluticasone (FLONASE) 50 MCG/ACT nasal spray Place 1-2 sprays into both nostrils daily for 7 days. 04/03/18 04/10/18  Wieters, Hallie C, PA-C  gabapentin (NEURONTIN) 100 MG capsule TAKE 1 CAPSULE BY MOUTH AT NIGHT AND INCREASE BY 1 CAP EVERY 1 TO 2 WEEKS UNTIL 4 CAPS NIGHTLY 03/31/18   Truitt Merle, MD  HYDROcodone-acetaminophen (NORCO/VICODIN) 5-325 MG tablet Take 1-2 tablets by mouth every 4 (four) hours as needed for moderate pain. 01/30/17   Irene Limbo, MD  levothyroxine (SYNTHROID, LEVOTHROID) 50 MCG tablet Take 50 mcg by mouth daily before breakfast.    [provider]  methocarbamol (ROBAXIN) 500 MG tablet Take 1 tablet (500 mg total) by mouth every 8 (eight) hours as needed for muscle spasms.  01/30/17   Irene Limbo, MD  omeprazole (PRILOSEC) 40 MG capsule TAKE ONE CAPSULE BY MOUTH EVERY DAY 05/25/16   Irene Shipper, MD  predniSONE (DELTASONE) 50 MG tablet Take 1 tablet (50 mg total) by mouth daily for 5 days. 04/03/18 04/08/18  Wieters, Hallie C, PA-C  venlafaxine XR (EFFEXOR-XR) 150 MG 24 hr capsule TAKE 1 CAPSULE (150 MG TOTAL) BY MOUTH DAILY WITH BREAKFAST. 01/26/18   Truitt Merle, MD  venlafaxine XR (EFFEXOR-XR) 75 MG 24 hr capsule Take 1 capsule (75 mg total) by mouth daily with breakfast. 01/26/18   Truitt Merle, MD    Family History Family History  Problem Relation Age of Onset  . COPD Mother   . Ovarian cancer Mother 12       surgery, no chemo  . Kidney disease Mother   . Diabetes Father   . Heart disease Father   . Hypertension Father   . Colon cancer Father 32  . Other Father        Amyloidosis  . Cancer Father        2nd cancer later in life, type unk  . Hypertension Sister   . Depression Sister   . Arthritis Sister   . Von Willebrand disease Sister   . Breast cancer Paternal Grandmother        pt doesn't remember, but her sister says this grandmother had br cancer  . Goiter Paternal Grandmother   . Penile cancer Paternal Uncle 44  . Rheum arthritis Maternal Grandmother     Social History Social History   Tobacco Use  . Smoking status: Former Smoker    Packs/day: 0.50    Years: 15.00    Pack years: 7.50    Types: Cigarettes    Last attempt to quit: 04/08/2016    Years since quitting: 1.9  . Smokeless tobacco: Never Used  Substance Use Topics  . Alcohol use: Yes    Alcohol/week: 14.0 standard drinks    Types: 12 Cans of beer, 2 Standard drinks or equivalent per week    Comment: moderate, daily   . Drug use: No     Allergies   Adhesive [tape] and Promethazine hcl   Review of Systems Review of Systems  Constitutional: Negative for activity change, appetite change,  chills, fatigue and fever.  HENT: Positive for congestion, ear pain, rhinorrhea  and sore throat. Negative for sinus pressure and trouble swallowing.   Eyes: Negative for photophobia, pain, discharge, redness and visual disturbance.  Respiratory: Positive for cough. Negative for chest tightness and shortness of breath.   Cardiovascular: Negative for chest pain.  Gastrointestinal: Negative for abdominal pain, diarrhea, nausea and vomiting.  Genitourinary: Negative for decreased urine volume and hematuria.  Musculoskeletal: Positive for neck pain and neck stiffness. Negative for myalgias.  Skin: Negative for rash.  Neurological: Positive for headaches. Negative for dizziness, syncope, facial asymmetry, speech difficulty, weakness, light-headedness and numbness.     Physical Exam Triage Vital Signs ED Triage Vitals [04/03/18 1136]  Enc Vitals Group     BP (!) 145/89     Pulse Rate 95     Resp 18     Temp 99 F (37.2 C)     Temp Source Oral     SpO2 96 %     Weight      Height      Head Circumference      Peak Flow      Pain Score 2     Pain Loc      Pain Edu?      Excl. in [place]?    No data found.  Updated Vital Signs BP (!) 145/89 (BP Location: Left Arm)   Pulse 95   Temp 99 F (37.2 C) (Oral)   Resp 18   SpO2 96%   Visual Acuity Right Eye Distance:   Left Eye Distance:   Bilateral Distance:    Right Eye Near:   Left Eye Near:    Bilateral Near:     Physical Exam Vitals signs and nursing note reviewed.  Constitutional:      General: She is not in acute distress.    Appearance: She is well-developed.  HENT:     Head: Normocephalic and atraumatic.     Ears:     Comments: Bilateral ears without tenderness to palpation of external auricle, tragus and mastoid, EAC's without erythema or swelling, TM's with good bony landmarks and cone of light. Non erythematous.  Effusion present bilaterally    Mouth/Throat:     Comments: Oral mucosa pink and moist, no tonsillar enlargement or exudate. Posterior pharynx patent and nonerythematous, no uvula  deviation or swelling. Normal phonation. Eyes:     Extraocular Movements: Extraocular movements intact.     Conjunctiva/sclera: Conjunctivae normal.     Pupils: Pupils are equal, round, and reactive to light.  Neck:     Musculoskeletal: Neck supple.     Comments: Full active range of motion of neck, nontender to palpation of cervical spine midline as well as bilateral cervical musculature Cardiovascular:     Rate and Rhythm: Normal rate and regular rhythm.     Heart sounds: No murmur.  Pulmonary:     Effort: Pulmonary effort is normal. No respiratory distress.     Breath sounds: Normal breath sounds.     Comments: Breathing comfortably at rest, CTABL, no wheezing, rales or other adventitious sounds auscultated Abdominal:     Palpations: Abdomen is soft.     Tenderness: There is no abdominal tenderness.  Skin:    General: Skin is warm and dry.  Neurological:     General: No focal deficit present.     Mental Status: She is alert and oriented to person, place, and time. Mental status is at baseline.  Cranial Nerves: Cranial nerve deficit present.      UC Treatments / Results  Labs (all labs ordered are listed, but only abnormal results are displayed) Labs Reviewed - No data to display  EKG None  Radiology No results found.  Procedures Procedures (including critical care time)  Medications Ordered in UC Medications  ketorolac (TORADOL) injection 60 mg (has no administration in time range)  dexamethasone (DECADRON) injection 10 mg (has no administration in time range)    Initial Impression / Assessment and Plan / UC Course  I have reviewed the triage vital signs and the nursing notes.  Pertinent labs & imaging results that were available during my care of the patient were reviewed by me and considered in my medical decision making (see chart for details).     Exam unremarkable in clinic today, vital signs stable, URI symptoms x2 to 3 days, will treat headache with  Toradol and Decadron.  Given history of bronchitis will provide printed prescription for prednisone to fill if developing worsening prednisone, at this time will treat with albuterol inhaler as needed.  Recommending further symptomatic management with Zyrtec as likely drainage contributing to a sore throat, Flonase nasal spray to help with effusion and nasal congestion, Tessalon as needed for cough.  Rest, push fluids, continue to monitor breathing and temperature,Discussed strict return precautions. Patient verbalized understanding and is agreeable with plan.  Final Clinical Impressions(s) / UC Diagnoses   Final diagnoses:  Acute non intractable tension-type headache  Viral URI with cough     Discharge Instructions     We gave you an injection of Toradol and Decadron today to help with your headache as well as decrease any inflammation in your lungs  Your cold symptoms are most likely from a viral upper respiratory infection and should run their course over approximately 1 week In the meantime please begin daily cetirizine/Zyrtec or Claritin to help with congestion and drainage, may supplement with Flonase nasal spray to further help with congestion and ear discomfort Tessalon every 8 hours as needed for cough May fill prescription for prednisone if developing increased wheezing/inflammation in chest Tylenol and ibuprofen as needed for any further headache Rest, drink plenty of fluids  Please follow-up if symptoms not improving, worsening, developing increased shortness of breath, difficulty breathing, chest discomfort   ED Prescriptions    Medication Sig Dispense Auth. Provider   Cetirizine HCl 10 MG CAPS Take 1 capsule (10 mg total) by mouth daily for 10 days. 10 capsule Wieters, Hallie C, PA-C   fluticasone (FLONASE) 50 MCG/ACT nasal spray Place 1-2 sprays into both nostrils daily for 7 days. 1 g Wieters, Hallie C, PA-C   benzonatate (TESSALON) 200 MG capsule Take 1 capsule (200 mg  total) by mouth 3 (three) times daily as needed for up to 7 days for cough. 28 capsule Wieters, Hallie C, PA-C   predniSONE (DELTASONE) 50 MG tablet Take 1 tablet (50 mg total) by mouth daily for 5 days. 5 tablet Wieters, Hallie C, PA-C   albuterol (PROVENTIL HFA;VENTOLIN HFA) 108 (90 Base) MCG/ACT inhaler Inhale 1-2 puffs into the lungs every 6 (six) hours as needed for wheezing or shortness of breath. 1 Inhaler Wieters, Hallie C, PA-C     Controlled Substance Prescriptions [place] Controlled Substance Registry consulted? Not Applicable   Janith Lima, [place] 04/03/18 1213

## 2018-04-03 NOTE — ED Triage Notes (Signed)
The patient presented to the [facility] with a complaint of a cough, migraine headache and neck stiffness x 1 week.

## 2018-04-03 NOTE — Discharge Instructions (Signed)
We gave you an injection of Toradol and Decadron today to help with your headache as well as decrease any inflammation in your lungs  Your cold symptoms are most likely from a viral upper respiratory infection and should run their course over approximately 1 week In the meantime please begin daily cetirizine/Zyrtec or Claritin to help with congestion and drainage, may supplement with Flonase nasal spray to further help with congestion and ear discomfort Tessalon every 8 hours as needed for cough May fill prescription for prednisone if developing increased wheezing/inflammation in chest Tylenol and ibuprofen as needed for any further headache Rest, drink plenty of fluids  Please follow-up if symptoms not improving, worsening, developing increased shortness of breath, difficulty breathing, chest discomfort

## 2018-05-02 ENCOUNTER — Other Ambulatory Visit: Payer: Self-pay | Admitting: Hematology

## 2018-06-08 ENCOUNTER — Telehealth: Payer: Self-pay | Admitting: Hematology

## 2018-06-08 NOTE — Telephone Encounter (Signed)
Changed 4/29 appt to telephone visit per sch msg. Cancelled lab. Called and spoke with patient. Needs to keep the later time due to work [place]

## 2018-06-15 DIAGNOSIS — Z0000 Encounter for general adult medical examination without abnormal findings: Secondary | ICD-10-CM | POA: Diagnosis not present

## 2018-06-15 DIAGNOSIS — E559 Vitamin D deficiency, unspecified: Secondary | ICD-10-CM | POA: Diagnosis not present

## 2018-06-15 DIAGNOSIS — E039 Hypothyroidism, unspecified: Secondary | ICD-10-CM | POA: Diagnosis not present

## 2018-06-17 NOTE — Progress Notes (Signed)
[name]   [phone] [phone]) [phone]   Clinic Follow up Note   Patient Care Team: Katie Commons, FNP as PCP - General (Internal Medicine) [name], Katie Record, MD (Inactive) (Orthopedic Surgery) Katie Guys, MD (Ophthalmology) Katie Monarch, MD (Dermatology) Katie Merle, MD as Consulting Physician (Hematology) Katie Bookbinder, MD as Consulting Physician (General Surgery) Katie Rudd, MD as Consulting Physician (Radiation Oncology) Katie Bison Charlestine Massed, NP as Nurse Practitioner (Hematology and Oncology)   I connected with [name] on 06/20/2018 at  3:30 PM EDT by telephone visit and verified that I am speaking with the correct person using two identifiers.  I discussed the limitations, risks, security and privacy concerns of performing an evaluation and management service by telephone and the availability of in person appointments. I also discussed with the patient that there may be a patient responsible charge related to this service. The patient expressed understanding and agreed to proceed.   Patient's location:  Her home  Provider's location:  My office   CHIEF COMPLAINT: F/u on right breast cancer  SUMMARY OF ONCOLOGIC HISTORY: Oncology History   Cancer Staging Malignant neoplasm of overlapping sites of right breast in female, estrogen receptor positive ([place]) Staging form: Breast, AJCC 8th Edition - Clinical stage from 03/23/2016: Stage IIA (cT3, cN1, cM0, G2, ER: Positive, PR: Positive, HER2: Negative) - Signed by Katie Merle, MD on 03/31/2016 - Pathologic stage from 05/19/2016: Stage IA (pT2(m), pN1a, cM0, G1, ER: Positive, PR: Positive, HER2: Negative) - Signed by Katie Merle, MD on 06/18/2016       Malignant neoplasm of overlapping sites of right breast in female, estrogen receptor positive ([place])   03/17/2016 Mammogram    B/l diagnostic mammogram and righ US showed a 3.6cm irregular mass in the right breast 11:00 position,  posterior depth, there is a enlarged lymph node in the right axilla is highly suspicious for malignancy. additional 7 mm oval mass in the right breast lower outer quadrant is suspicious for malignancy.    03/23/2016 Initial Biopsy    Right breast 9:30 position biopsy showed invasive ductal carcinoma, grade 1. 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    03/23/2016 Receptors her2    Both breast and node biopsy tumor ER 100% positive, PR 70-95% positive, HER-2 negative, Ki-67 40%    03/23/2016 Initial Diagnosis    Malignant neoplasm of upper-outer quadrant of right breast in female, estrogen receptor positive ([place])    03/25/2016 Initial Biopsy    Right breast 11:00 position core needle biopsy showed invasive duct carcinoma, grade 2.     03/25/2016 Receptors her2    ER 95% positive, PR 90% positive, HER-2 negative, Ki-67 15%    03/25/2016 Miscellaneous    Mammaprint showed low risk type A with index +0.105    03/30/2016 Imaging    Bilateral breast MRI with and without contrast showed a large lobulated enhancing mass within the upper-outer and lower outer right breast with surrounding nodularity, measuring 6.1 x 4.4 x 5.6 cm. Multiple critically sick and right axillary lymph nodes are demonstrated measuring up to 1.5 cm.     04/01/2016 -  Anti-estrogen oral therapy    Exemestane 25 mg daily, plan for 7 years. Switched to Anastrozole 56m in 06/2017 due to joint pain and hot flashes.    05/19/2016 Surgery    Bilateral mastectomy and right axillary regional lymph node resection.    05/19/2016 Pathology Results    -Right axillary regional lymph node resection  revealed metastatic carcinoma in 2/7 lymph nodes. -Left simple mastectomy revealed lobular neoplasia and fibrocystic changes with adenosis and calcifications. -Right simple mastectomy revealed grade 1 invasive mixed lobular-ductal carcinoma, multiple foci, with the largest measuring 3.0 cm, lobular neoplasia, atypical  ductal hyperplasia, lymphovascular invasion, and the surgical resection margins were clear. -Skin of the right mastectomy flap was benign. -mpT2, pN1a    06/25/2016 - 08/11/2016 Radiation Therapy    Site/dose:    1. 4 field Right breast was treated to 50.4 Gy in 25 fractions at 1.8 Gy per fraction. 2. The Right breast was boosted to 10 Gy in 5 fractions at 2 Gy per fraction.    04/15/2017 Genetic Testing    The patient had genetic testing due to a personal history of breast cancer and family history of breast, ovarian, and colon cancer. The Common Hereditary Cancer Panel was ordered.  The Common Hereditary Cancer Panel offered by [facility] includes sequencing and/or deletion duplication testing of the following 47 genes: APC, ATM, AXIN2, BARD1, BMPR1A, BRCA1, BRCA2, BRIP1, CDH1, CDKN2A (p14ARF), CDKN2A (p16INK4a), CKD4, CHEK2, CTNNA1, DICER1, EPCAM (Deletion/duplication testing only), GREM1 (promoter region deletion/duplication testing only), KIT, MEN1, MLH1, MSH2, MSH3, MSH6, MUTYH, NBN, NF1, NHTL1, PALB2, PDGFRA, PMS2, POLD1, POLE, PTEN, RAD50, RAD51C, RAD51D, SDHB, SDHC, SDHD, SMAD4, SMARCA4. STK11, TP53, TSC1, TSC2, and VHL.  The following genes were evaluated for sequence changes only: SDHA and HOXB13 c.251G>A variant only.  Results: Negative, no pathogenic variants identified. The date of this test report is 04/15/2017    06/28/2017 Surgery    COMPLEX REVISION OF BACK SCAR and LEFT BREAST RECONSTRUCTION WITH SILICONE IMPLANT EXCHANGE AND ACELLULARDERMIS TO LEFT CHEST by Dr. Iran Hogan 06/28/17      CURRENT THERAPY:  Anastrozole 40m, started in 06/2017  INTERVAL HISTORY:  KTOMEKIA HELTONis here for a follow up of right breast cancer. She was able to identify herself by birth date. She notes she saw other Physicians today and took labs with her PCP as well as her DEXA scan which shows improvement.   She notes she tried Gabapentin. She tried to increase to 200-3030mbut did not tolerate the  drowsiness. She notes 10038man help her hot flashes but not fully controled. It overall improved with working from home. She notes she is taking 225m67mfexor. She notes she has recent dry mouth and vaginal dryness. She also notes pain in her hands. She notes this pain is primarily in the morning. This can be exacerbated with work.  She also notes her PCP plans to do a [place].  She also notes weight gain. She has not been exercising well due to SOB from history of smoking. She has an albuterol inhaler but ran out of medication for it. She notes she has had this SOB with cold otherwise become more prevalent since her breast surgery. She notes she also has a dry cough which she feels she has had since her cancer treatment.    REVIEW OF SYSTEMS:   Constitutional: Denies fevers, chills (+) hot flashes mildly improved (+) weight gain Eyes: Denies blurriness of vision Ears, nose, mouth, throat, and face: Denies mucositis or sore throat (+) dry mouth  Respiratory: (+) SOB upon exertion (+) Dry cough and wheezing Cardiovascular: Denies palpitation, chest discomfort or lower extremity swelling Gastrointestinal:  Denies nausea, heartburn or change in bowel habits UA: (+) Vaginal dryness  Skin: Denies abnormal skin rashes MSK: (+) pain her hands Lymphatics: Denies new lymphadenopathy or easy bruising Neurological:Denies numbness,  tingling or new weaknesses Behavioral/Psych: Mood is stable, no new changes  All other systems were reviewed with the patient and are negative.  MEDICAL HISTORY:  Past Medical History:  Diagnosis Date   Anxiety    Anxiety disorder    Asthma    h/o asthma as a child   Breast cancer ([place])    Cancer ([place]) 04/2016   right breast cancer   Cholelithiasis    Chronic kidney disease    obstruction of R kidney, ( not a stone) - currently resolved    Depression    Diverticulosis    DJD (degenerative joint disease)    hands & back    Dyspnea    resolved  since she stopped smoking    Epigastric abdominal pain    Esophageal stricture    Family history of adverse reaction to anesthesia    daughter has N&V, takes long time to wake up    Family history of colon cancer    Family history of ovarian cancer    Family hx of colon cancer    Female pelvic peritoneal adhesions 10/26/2012   Ganglion cyst    GERD (gastroesophageal reflux disease)    Headache    low grade currently , family history of migraines    Hemorrhoid    History of radiation therapy 05/03 - 08/11/2016   1. 4 field Right breast was treated to 50.4 Gy in 25 fractions at 1.8 Gy per fraction. 2. The Right breast was boosted to 10 Gy in 5 fractions at 2 Gy per fraction.   Hypothyroidism    Plantar fasciitis, bilateral    PONV (postoperative nausea and vomiting)    gets anxious with the mask on her face, also remarks that the scop. patch has helped in the past       SURGICAL HISTORY: Past Surgical History:  Procedure Laterality Date   ABDOMINAL HYSTERECTOMY     BREAST RECONSTRUCTION WITH PLACEMENT OF TISSUE EXPANDER AND FLEX HD (ACELLULAR HYDRATED DERMIS) Bilateral 05/19/2016   Procedure: BREAST RECONSTRUCTION WITH PLACEMENT OF TISSUE EXPANDER AND ALLODERM PLACEMENT;  Surgeon: Irene Limbo, MD;  Location: [facility];  Service: Plastics;  Laterality: Bilateral;   BREAST RECONSTRUCTION WITH PLACEMENT OF TISSUE EXPANDER AND FLEX HD (ACELLULAR HYDRATED DERMIS) Left 06/28/2017   Procedure: LEFT BREAST RECONSTRUCTION WITH SILICONE IMPLANT EXCHANGE AND ACELLULARDERMIS TO LEFT CHEST;  Surgeon: Irene Limbo, MD;  Location: [facility];  Service: Plastics;  Laterality: Left;   CESAREAN SECTION  1988   CHOLECYSTECTOMY     HEMORRHOID SURGERY     LAPAROSCOPIC BILATERAL SALPINGECTOMY N/A 10/26/2012   Procedure: operative laparoscopy with lysis of adhesions;  Surgeon: Thornell Sartorius, MD;  Location: [facility];  Service: Gynecology;  Laterality: N/A;     MASTECTOMY Bilateral    MASTECTOMY WITH RADIOACTIVE SEED GUIDED EXCISION AND AXILLARY SENTINEL LYMPH NODE BIOPSY Bilateral 05/19/2016   Procedure: RIGHT SKIN SPARING MASTECTOMY WITH RIGHT RADIOACTIVE SEED TARGETED DISSECTION AND RIGHT SENTINEL LYMPH NODE BIOPSY, LEFT PROPHYLACTIC SKIN SPARING MASTECTOMY;  Surgeon: Katie Bookbinder, MD;  Location: [facility];  Service: General;  Laterality: Bilateral;   REMOVAL OF BILATERAL TISSUE EXPANDERS WITH PLACEMENT OF BILATERAL BREAST IMPLANTS Bilateral 01/29/2017   Procedure: REMOVAL OF BILATERAL TISSUE EXPANDERS WITH PLACEMENT OF BILATERAL SILICONE BREAST IMPLANTS, ALLODERM TO LEFT BREAST RECONSTRUCTION;  RIGHT LATISSUMUS FLAP;  Surgeon: Irene Limbo, MD;  Location: [facility];  Service: Plastics;  Laterality: Bilateral;  Requesting RNFA   SCAR REVISION Right 06/28/2017  Procedure: COMPLEX REVISION OF BACK SCAR;  Surgeon: Irene Limbo, MD;  Location: [facility];  Service: Plastics;  Laterality: Right;   TONSILLECTOMY     TUBAL LIGATION     urologic surgery for ureteropelvic junction obstruction      I have reviewed the social history and family history with the patient and they are unchanged from previous note.  ALLERGIES:  is allergic to adhesive [tape] and promethazine hcl.  MEDICATIONS:  Current Outpatient Medications  Medication Sig Dispense Refill   acetaminophen (TYLENOL) 500 MG tablet Take 1,000 mg by mouth every 6 (six) hours as needed.     albuterol (PROVENTIL HFA;VENTOLIN HFA) 108 (90 Base) MCG/ACT inhaler Inhale 1-2 puffs into the lungs every 6 (six) hours as needed for wheezing or shortness of breath. 1 Inhaler 0   ALPRAZolam (XANAX) 1 MG tablet Take 1 tablet (1 mg total) by mouth 3 (three) times daily. 90 tablet 0   anastrozole (ARIMIDEX) 1 MG tablet TAKE 1 TABLET BY MOUTH EVERY DAY 90 tablet 1   celecoxib (CELEBREX) 200 MG capsule Take 1 capsule (200 mg total) by mouth daily. 30 capsule 2    Cetirizine HCl 10 MG CAPS Take 1 capsule (10 mg total) by mouth daily for 10 days. 10 capsule 0   DENTA 5000 PLUS 1.1 % CREA dental cream      docusate sodium (COLACE) 100 MG capsule Take 100 mg by mouth daily as needed for mild constipation.     fluticasone (FLONASE) 50 MCG/ACT nasal spray Place 1-2 sprays into both nostrils daily for 7 days. 1 g 0   gabapentin (NEURONTIN) 100 MG capsule TAKE 1 CAPSULE BY MOUTH AT NIGHT AND INCREASE BY 1 CAP EVERY 1 TO 2 WEEKS UNTIL 4 CAPS NIGHTLY 90 capsule 0   HYDROcodone-acetaminophen (NORCO/VICODIN) 5-325 MG tablet Take 1-2 tablets by mouth every 4 (four) hours as needed for moderate pain. 40 tablet 0   levothyroxine (SYNTHROID, LEVOTHROID) 50 MCG tablet Take 50 mcg by mouth daily before breakfast.     methocarbamol (ROBAXIN) 500 MG tablet Take 1 tablet (500 mg total) by mouth every 8 (eight) hours as needed for muscle spasms. 30 tablet 0   omeprazole (PRILOSEC) 40 MG capsule TAKE ONE CAPSULE BY MOUTH EVERY DAY 30 capsule 4   venlafaxine XR (EFFEXOR-XR) 150 MG 24 hr capsule TAKE 1 CAPSULE (150 MG TOTAL) BY MOUTH DAILY WITH BREAKFAST. 90 capsule 1   venlafaxine XR (EFFEXOR-XR) 75 MG 24 hr capsule Take 1 capsule (75 mg total) by mouth daily with breakfast. 90 capsule 2   No current facility-administered medications for this visit.     PHYSICAL EXAMINATION: ECOG PERFORMANCE STATUS: 1 - Symptomatic but completely ambulatory  No vitals taken today, Exam not performed today  LABORATORY DATA:  I have reviewed the data as listed CBC Latest Ref Rng & Units 02/17/2018 10/18/2017 06/21/2017  WBC 4.0 - 10.5 K/uL 5.3 5.7 5.8  Hemoglobin 12.0 - 15.0 g/dL 12.5 12.5 11.9  Hematocrit 36.0 - 46.0 % 39.9 38.7 38.9  Platelets 150 - 400 K/uL 244 242 254     CMP Latest Ref Rng & Units 02/17/2018 10/18/2017 06/21/2017  Glucose 70 - 99 mg/dL 107(H) 110(H) 99  BUN 6 - 20 mg/dL _0 Creatinine 0.44 - 1.00 mg/dL 0.89 0.84 0.89  Sodium 135 - 145 mmol/L 142  141 139  Potassium 3.5 - 5.1 mmol/L 4.2 3.8 3.8  Chloride 98 - 111 mmol/L 105 106 104  CO2  22 - 32 mmol/L _0 Calcium 8.9 - 10.3 mg/dL 9.4 9.2 9.4  Total Protein 6.5 - 8.1 g/dL 6.8 6.7 6.8  Total Bilirubin 0.3 - 1.2 mg/dL 0.5 0.3 0.3  Alkaline Phos 38 - 126 U/L 116 117 101  AST 15 - 41 U/L _1 ALT 0 - 44 U/L _2 RADIOGRAPHIC STUDIES: I have personally reviewed the radiological images as listed and agreed with the findings in the report. No results found.   ASSESSMENT & PLAN:  [name] is a 58 y.o. female with   1. Malignant new overlapping sites of right breast, mpT2 pN1a, stage IA, G1, ER positive, PR positive, HER-2 negative, mammaprint low risk luminal type A -Diagnosed in 02/2016. Treated with bilateral mastectomy, radiation, and antihormonal therapy. Exemestane was switched to Anastrozole 1 mg daily since 06/2017. Tolerating moderately well with hot flashes and moderate hand pain, weight gain and vaginal/oral dryness.  -From a breast cancer standpoint she is clinically doing well. She has a b/l mastectomy and does not need mammogram. There is no clinical concern for recurrence.  -Continue Anastrozole.  -will obtain today's labs and DEXA scan from her PCP  -f/u in 2-3 months  2. Insomnia  -She was previously on Ambien, and attempted to take Benadryl. I previously advised her to discuss with her PCP.  3. Hot flashes -Currently on Effexor 225 mg daily. -Effexor has helped her mood swings but does not adequately control the hot flashes.  -She is also on Gabapentin 165m which helps some. She cannot tolerate higher doses.  -Hot flashes has improved mildly.   4. Dry mouth and vaginal dryness  -secondary to Anastrozole  -I suggest vaginal lubricant use as needed. I advised her to avoid any estrogen containing creams.   5. Weight gain  -secondary to anastrozole  -I instructed her to have a healthy diet with less carbohydrates and to exercise  regularly to control her weight  6. H/o of smoking, asthma, dry cough and dyspnea on exertion  -she has history of smoking (1/2 ppd for several years) and asthma for which she use albuterol inhaler as needed  -She has had dry cough and dyspnear on exertion since her cancer treatment. She saw her PCP today and had EKG done -this is likely related to her history of lung disease, will tale to her PCP to see if additional work up is needed    Plan  -Continue Anastrozole -f/u and lab in 2-3 months -I will talk to her PCP NP MThedora Hinders   No problem-specific Assessment & Plan notes found for this encounter.   No orders of the defined types were placed in this encounter.  I discussed the assessment and treatment plan with the patient. The patient was provided an opportunity to ask questions and all were answered. The patient agreed with the plan and demonstrated an understanding of the instructions.  The patient was advised to call back or seek an in-person evaluation if the symptoms worsen or if the condition fails to improve as anticipated.   I provided 20 minutes of non face-to-face telephone visit time during this encounter, and > 50% was spent counseling as documented under my assessment & plan.    YTruitt Merle MD 06/20/2018   I, AJoslyn Devon am acting as scribe for YTruitt Merle MD.   I have reviewed the above documentation for accuracy and completeness, and I agree with the above.

## 2018-06-20 ENCOUNTER — Inpatient Hospital Stay: Payer: BLUE CROSS/BLUE SHIELD | Attending: Hematology | Admitting: Hematology

## 2018-06-20 ENCOUNTER — Encounter: Payer: Self-pay | Admitting: Hematology

## 2018-06-20 ENCOUNTER — Other Ambulatory Visit: Payer: BLUE CROSS/BLUE SHIELD

## 2018-06-20 ENCOUNTER — Telehealth: Payer: Self-pay | Admitting: Hematology

## 2018-06-20 DIAGNOSIS — Z421 Encounter for breast reconstruction following mastectomy: Secondary | ICD-10-CM

## 2018-06-20 DIAGNOSIS — Z79899 Other long term (current) drug therapy: Secondary | ICD-10-CM

## 2018-06-20 DIAGNOSIS — Z0000 Encounter for general adult medical examination without abnormal findings: Secondary | ICD-10-CM | POA: Diagnosis not present

## 2018-06-20 DIAGNOSIS — Z170 Estrogen receptor positive status [ER+]: Secondary | ICD-10-CM | POA: Diagnosis not present

## 2018-06-20 DIAGNOSIS — Z800 Family history of malignant neoplasm of digestive organs: Secondary | ICD-10-CM

## 2018-06-20 DIAGNOSIS — G4700 Insomnia, unspecified: Secondary | ICD-10-CM

## 2018-06-20 DIAGNOSIS — Z90710 Acquired absence of both cervix and uterus: Secondary | ICD-10-CM

## 2018-06-20 DIAGNOSIS — Z923 Personal history of irradiation: Secondary | ICD-10-CM

## 2018-06-20 DIAGNOSIS — Z9013 Acquired absence of bilateral breasts and nipples: Secondary | ICD-10-CM

## 2018-06-20 DIAGNOSIS — C50811 Malignant neoplasm of overlapping sites of right female breast: Secondary | ICD-10-CM

## 2018-06-20 DIAGNOSIS — M8589 Other specified disorders of bone density and structure, multiple sites: Secondary | ICD-10-CM | POA: Diagnosis not present

## 2018-06-20 DIAGNOSIS — Z8041 Family history of malignant neoplasm of ovary: Secondary | ICD-10-CM

## 2018-06-20 DIAGNOSIS — Z79810 Long term (current) use of selective estrogen receptor modulators (SERMs): Secondary | ICD-10-CM

## 2018-06-20 DIAGNOSIS — E039 Hypothyroidism, unspecified: Secondary | ICD-10-CM | POA: Diagnosis not present

## 2018-06-20 NOTE — Telephone Encounter (Signed)
Scheduled appt per 4/27 los. ° °A calendar will be mailed out. °

## 2018-06-21 ENCOUNTER — Other Ambulatory Visit: Payer: Self-pay | Admitting: Internal Medicine

## 2018-06-21 DIAGNOSIS — R1021 Pelvic and perineal pain right side: Secondary | ICD-10-CM

## 2018-06-21 DIAGNOSIS — R102 Pelvic and perineal pain: Secondary | ICD-10-CM

## 2018-07-13 DIAGNOSIS — Z923 Personal history of irradiation: Secondary | ICD-10-CM | POA: Diagnosis not present

## 2018-07-13 DIAGNOSIS — Z9013 Acquired absence of bilateral breasts and nipples: Secondary | ICD-10-CM | POA: Diagnosis not present

## 2018-07-13 DIAGNOSIS — Z853 Personal history of malignant neoplasm of breast: Secondary | ICD-10-CM | POA: Diagnosis not present

## 2018-07-15 DIAGNOSIS — R102 Pelvic and perineal pain: Secondary | ICD-10-CM | POA: Diagnosis not present

## 2018-08-14 ENCOUNTER — Other Ambulatory Visit: Payer: Self-pay | Admitting: Hematology

## 2018-08-14 DIAGNOSIS — C50811 Malignant neoplasm of overlapping sites of right female breast: Secondary | ICD-10-CM

## 2018-08-19 ENCOUNTER — Telehealth: Payer: Self-pay | Admitting: Hematology

## 2018-08-19 NOTE — Telephone Encounter (Signed)
YF PAL - move lab/fu from 7/6 to 8/21. Date/time confirmed with patient and per patient request.

## 2018-08-29 ENCOUNTER — Ambulatory Visit: Payer: BLUE CROSS/BLUE SHIELD | Admitting: Hematology

## 2018-08-29 ENCOUNTER — Other Ambulatory Visit: Payer: BLUE CROSS/BLUE SHIELD

## 2018-10-13 NOTE — Progress Notes (Signed)
[name]   [phone] [phone]   Clinic Follow up Note   Patient Care Team: Adria Dill Le[place] Reader, FNP as PCP - General (Internal Medicine) [place], Laurice Record, MD (Inactive) (Orthopedic Surgery) Rutherford Guys, MD (Ophthalmology) Lavonna Monarch, MD (Dermatology) Truitt Merle, MD as Consulting Physician (Hematology) Rolm Bookbinder, MD as Consulting Physician (General Surgery) Kyung Rudd, MD as Consulting Physician (Radiation Oncology) Delice Bison, Charlestine Massed, NP as Nurse Practitioner (Hematology and Oncology)  Date of Service:  10/14/2018  CHIEF COMPLAINT: F/u on right breast cancer  SUMMARY OF ONCOLOGIC HISTORY: Oncology History Overview Note  Cancer Staging Malignant neoplasm of overlapping sites of right breast in female, estrogen receptor positive ([place]) Staging form: Breast, AJCC 8th Edition - Clinical stage from 03/23/2016: Stage IIA (cT3, cN1, cM0, G2, ER: Positive, PR: Positive, HER2: Negative) - Signed by Truitt Merle, MD on 03/31/2016 - Pathologic stage from 05/19/2016: Stage IA (pT2(m), pN1a, cM0, G1, ER: Positive, PR: Positive, HER2: Negative) - Signed by Truitt Merle, MD on 06/18/2016     Malignant neoplasm of overlapping sites of right breast in female, estrogen receptor positive ([place])  03/17/2016 Mammogram   B/l diagnostic mammogram and righ US showed a 3.6cm irregular mass in the right breast 11:00 position, posterior depth, there is a enlarged lymph node in the right axilla is highly suspicious for malignancy. additional 7 mm oval mass in the right breast lower outer quadrant is suspicious for malignancy.   03/23/2016 Initial Biopsy   Right breast 9:30 position biopsy showed invasive ductal carcinoma, grade 1. 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   03/23/2016 Receptors her2   Both breast and node biopsy tumor ER 100% positive, PR 70-95% positive, HER-2 negative, Ki-67 40%   03/23/2016 Initial Diagnosis   Malignant neoplasm of upper-outer quadrant of right breast in female, estrogen receptor positive ([place])   03/25/2016 Initial Biopsy   Right breast 11:00 position core needle biopsy showed invasive duct carcinoma, grade 2.    03/25/2016 Receptors her2   ER 95% positive, PR 90% positive, HER-2 negative, Ki-67 15%   03/25/2016 Miscellaneous   Mammaprint showed low risk type A with index +0.105   03/30/2016 Imaging   Bilateral breast MRI with and without contrast showed a large lobulated enhancing mass within the upper-outer and lower outer right breast with surrounding nodularity, measuring 6.1 x 4.4 x 5.6 cm. Multiple critically sick and right axillary lymph nodes are demonstrated measuring up to 1.5 cm.    04/01/2016 -  Anti-estrogen oral therapy   Exemestane 25 mg daily, plan for 7 years. Switched to Anastrozole 68m in 06/2017 due to joint pain and hot flashes.   05/19/2016 Surgery   Bilateral mastectomy and right axillary regional lymph node resection.   05/19/2016 Pathology Results   -Right axillary regional lymph node resection revealed metastatic carcinoma in 2/7 lymph nodes. -Left simple mastectomy revealed lobular neoplasia and fibrocystic changes with adenosis and calcifications. 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 -Skin of the right mastectomy flap was benign. -mpT2, pN1a   06/25/2016 - 08/11/2016 Radiation Therapy   Site/dose:    1. 4 field Right breast was treated to 50.4 Gy in 25 fractions at 1.8 Gy per fraction. 2. The Right breast was boosted to 10 Gy in 5 fractions at 2 Gy per fraction.   04/15/2017 Genetic Testing  The patient had genetic testing due to a personal history of breast cancer and family history of breast, ovarian, and colon cancer. The Common Hereditary  Cancer Panel was ordered.  The Common Hereditary Cancer Panel offered by [facility] includes sequencing and/or deletion duplication testing of the following 47 genes: APC, ATM, AXIN2, BARD1, BMPR1A, BRCA1, BRCA2, BRIP1, CDH1, CDKN2A (p14ARF), CDKN2A (p16INK4a), CKD4, CHEK2, CTNNA1, DICER1, EPCAM (Deletion/duplication testing only), GREM1 (promoter region deletion/duplication testing only), KIT, MEN1, MLH1, MSH2, MSH3, MSH6, MUTYH, NBN, NF1, NHTL1, PALB2, PDGFRA, PMS2, POLD1, POLE, PTEN, RAD50, RAD51C, RAD51D, SDHB, SDHC, SDHD, SMAD4, SMARCA4. STK11, TP53, TSC1, TSC2, and VHL.  The following genes were evaluated for sequence changes only: SDHA and HOXB13 c.251G>A variant only.  Results: Negative, no pathogenic variants identified. The date of this test report is 04/15/2017   06/28/2017 Surgery   COMPLEX REVISION OF BACK SCAR and LEFT BREAST RECONSTRUCTION WITH SILICONE IMPLANT EXCHANGE AND ACELLULARDERMIS TO LEFT CHEST by Dr. Iran Planas 06/28/17      CURRENT THERAPY:  Anastrozole 68m, startedin 06/2017  INTERVAL HISTORY:  Katie VLCEKis here for a follow up right breast cancer. She presents to the clinic alone. She notes she is doing well. She notes her hot flashes are still unbearable. She feels worse in the summer. She is on Effexor 2254mwhich can help some days but not consistently. Her sleep is 3-4 hours due to significant night sweats.  She also takes Gabapentin 10018mightly but cannot increase because of drowsiness. She also notes having dry mouth and vaginal dryness.  She notes arthritis in her hands and back. She feels she forces herself to get out of bed due to stiffness. She notes she bought a standing desk to work from home. She notes her being less active, she feels sore at right axillary incision. She did do dry needling but had to stop because insurance was not covering it. She has normal ROM as she continues to stretch herself. The stress of going to work is not there for her anymore. She  notes her BMs are improved but still gets constipation occasionally.     REVIEW OF SYSTEMS:   Constitutional: Denies fevers, chills or abnormal weight loss (+) significant hot flashes  Eyes: Denies blurriness of vision Ears, nose, mouth, throat, and face: Denies mucositis or sore throat (+) Dry mouth  Respiratory: Denies cough, dyspnea or wheezes Cardiovascular: Denies palpitation, chest discomfort or lower extremity swelling Gastrointestinal:  Denies nausea, heartburn (+) Occasional Constipation  Skin: Denies abnormal skin rashes UA: (+) Vaginal dryness  MSK: (+) Arthritis in her hands and back with morning stiffness  Lymphatics: Denies new lymphadenopathy or easy bruising Neurological:Denies numbness, tingling or new weaknesses Behavioral/Psych: Mood is stable, no new changes  Breast: (+) Right axillary soreness  All other systems were reviewed with the patient and are negative.  MEDICAL HISTORY:  Past Medical History:  Diagnosis Date  . Anxiety   . Anxiety disorder   . Asthma    h/o asthma as a child  . [facility] [place] ([facility]   right breast cancer  . Cholelithiasis   . Chronic kidney disease    obstruction of R kidney, ( not a stone) - currently resolved   . Depression   . Diverticulosis   . DJD (degenerative joint disease)    hands & back   . Dyspnea    resolved since she stopped smoking   . Epigastric abdominal pain   . Esophageal stricture   .  Family history of adverse reaction to anesthesia    daughter has N&V, takes long time to wake up   . Family history of colon cancer   . Family history of ovarian cancer   . Family hx of colon cancer   . 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 Ganglion cyst   . GERD (gastroesophageal reflux disease)   . Headache    low grade currently , family history of migraines   . Hemorrhoid   . History of radiation therapy 05/03 - 08/11/2016   1. 4 field Right breast was treated to 50.4 Gy in 25  fractions at 1.8 Gy per fraction. 2. The Right breast was boosted to 10 Gy in 5 fractions at 2 Gy per fraction.  . Hypothyroidism   . Plantar fasciitis, bilateral   . PONV (postoperative nausea and vomiting)    gets anxious with the mask on her face, also remarks that the scop. patch has helped in the past       SURGICAL HISTORY: Past Surgical History:  Procedure Laterality Date  . ABDOMINAL HYSTERECTOMY    . BREAST RECONSTRUCTION WITH PLACEMENT OF TISSUE EXPANDER AND FLEX HD (ACELLULAR HYDRATED DERMIS) Bilateral 05/19/2016   Procedure: BREAST RECONSTRUCTION WITH PLACEMENT OF TISSUE EXPANDER AND ALLODERM PLACEMENT;  Surgeon: Irene Limbo, MD;  Location: [facility];  Service: Plastics;  Laterality: Bilateral;  . BREAST RECONSTRUCTION WITH PLACEMENT OF TISSUE EXPANDER AND FLEX HD (ACELLULAR HYDRATED DERMIS) Left 06/28/2017   Procedure: LEFT BREAST RECONSTRUCTION WITH SILICONE IMPLANT EXCHANGE AND ACELLULARDERMIS TO LEFT CHEST;  Surgeon: Irene Limbo, MD;  Location: [facility];  Service: Plastics;  Laterality: Left;  . CESAREAN SECTION  1988  . CHOLECYSTECTOMY    . HEMORRHOID SURGERY    . LAPAROSCOPIC BILATERAL SALPINGECTOMY N/A 10/26/2012   Procedure: operative laparoscopy with lysis of adhesions;  Surgeon: Thornell Sartorius, MD;  Location: [facility];  Service: Gynecology;  Laterality: N/A;  . MASTECTOMY Bilateral   . MASTECTOMY WITH RADIOACTIVE SEED GUIDED EXCISION AND AXILLARY SENTINEL LYMPH NODE BIOPSY Bilateral 05/19/2016   Procedure: RIGHT SKIN SPARING MASTECTOMY WITH RIGHT RADIOACTIVE SEED TARGETED DISSECTION AND RIGHT SENTINEL LYMPH NODE BIOPSY, LEFT PROPHYLACTIC SKIN SPARING MASTECTOMY;  Surgeon: Rolm Bookbinder, MD;  Location: [facility];  Service: General;  Laterality: Bilateral;  . REMOVAL OF BILATERAL TISSUE EXPANDERS WITH PLACEMENT OF BILATERAL BREAST IMPLANTS Bilateral 01/29/2017   Procedure: REMOVAL OF BILATERAL TISSUE EXPANDERS WITH  PLACEMENT OF BILATERAL SILICONE BREAST IMPLANTS, ALLODERM TO LEFT BREAST RECONSTRUCTION;  RIGHT LATISSUMUS FLAP;  Surgeon: Irene Limbo, MD;  Location: [facility];  Service: Plastics;  Laterality: Bilateral;  Requesting RNFA  . SCAR REVISION Right 06/28/2017   Procedure: COMPLEX REVISION OF BACK SCAR;  Surgeon: Irene Limbo, MD;  Location: [facility];  Service: Plastics;  Laterality: Right;  . TONSILLECTOMY    . TUBAL LIGATION    . urologic surgery for ureteropelvic junction obstruction      I have reviewed the social history and family history with the patient and they are unchanged from previous note.  ALLERGIES:  is allergic to adhesive [tape] and promethazine hcl.  MEDICATIONS:  Current Outpatient Medications  Medication Sig Dispense Refill  . acetaminophen (TYLENOL) 500 MG tablet Take 1,000 mg by mouth every 6 (six) hours as needed.    . ALPRAZolam (XANAX) 1 MG tablet Take 1 tablet (1 mg total) by mouth 3 (three) times daily. 90 tablet 0  . anastrozole (ARIMIDEX) 1 MG tablet TAKE 1  TABLET BY MOUTH EVERY DAY 90 tablet 1  . celecoxib (CELEBREX) 200 MG capsule Take 1 capsule (200 mg total) by mouth daily. 30 capsule 2  . gabapentin (NEURONTIN) 100 MG capsule TAKE 1 CAPSULE BY MOUTH AT NIGHT AND INCREASE BY 1 CAP EVERY 1 TO 2 WEEKS UNTIL 4 CAPS NIGHTLY 90 capsule 0  . HYDROcodone-acetaminophen (NORCO/VICODIN) 5-325 MG tablet Take 1-2 tablets by mouth every 4 (four) hours as needed for moderate pain. 40 tablet 0  . levothyroxine (SYNTHROID, LEVOTHROID) 50 MCG tablet Take 50 mcg by mouth daily before breakfast.    . methocarbamol (ROBAXIN) 500 MG tablet Take 1 tablet (500 mg total) by mouth every 8 (eight) hours as needed for muscle spasms. 30 tablet 0  . omeprazole (PRILOSEC) 40 MG capsule TAKE ONE CAPSULE BY MOUTH EVERY DAY 30 capsule 4  . venlafaxine XR (EFFEXOR-XR) 150 MG 24 hr capsule TAKE 1 CAPSULE (150 MG TOTAL) BY MOUTH DAILY WITH BREAKFAST. 90 capsule 0  .  venlafaxine XR (EFFEXOR-XR) 75 MG 24 hr capsule Take 1 capsule (75 mg total) by mouth daily with breakfast. 90 capsule 2  . Cetirizine HCl 10 MG CAPS Take 1 capsule (10 mg total) by mouth daily for 10 days. 10 capsule 0  . DENTA 5000 PLUS 1.1 % CREA dental cream     . tamoxifen (NOLVADEX) 20 MG tablet Take 1 tablet (20 mg total) by mouth daily. 30 tablet 2   No current facility-administered medications for this visit.     PHYSICAL EXAMINATION: ECOG PERFORMANCE STATUS: 1 - Symptomatic but completely ambulatory  Vitals:   10/14/18 1509  BP: (!) 143/87  Pulse: 97  Resp: 17  Temp: 98.2 F (36.8 C)  SpO2: 99%   Filed Weights   10/14/18 1509  Weight: 178 lb 8 oz (81 kg)    GENERAL:alert, no distress and comfortable SKIN: skin color, texture, turgor are normal, no rashes or significant lesions EYES: normal, Conjunctiva are pink and non-injected, sclera clear  NECK: supple, thyroid normal size, non-tender, without nodularity LYMPH:  no palpable lymphadenopathy in the cervical, axillary  LUNGS: clear to auscultation and percussion with normal breathing effort HEART: regular rate & rhythm and no murmurs and no lower extremity edema ABDOMEN:abdomen soft, non-tender and normal bowel sounds Musculoskeletal:no cyanosis of digits and no clubbing  NEURO: alert & oriented x 3 with fluent speech, no focal motor/sensory deficits BREAST: S/p b/l mastectomy: Surgical incisions healed well. (+) tenderness under right breast. (+) Breasts not symmetrical. No palpable mass, nodules or adenopathy bilaterally. Breast exam benign.   LABORATORY DATA:  I have reviewed the data as listed CBC Latest Ref Rng & Units 10/14/2018 02/17/2018 10/18/2017  WBC 4.0 - 10.5 K/uL 7.4 5.3 5.7  Hemoglobin 12.0 - 15.0 g/dL 13.3 12.5 12.5  Hematocrit 36.0 - 46.0 % 41.1 39.9 38.7  Platelets 150 - 400 K/uL 248 244 242     CMP Latest Ref Rng & Units 10/14/2018 02/17/2018 10/18/2017  Glucose 70 - 99 mg/dL 116(H) 107(H)  110(H)  BUN 6 - 20 mg/dL '15 14 17  ' Creatinine 0.44 - 1.00 mg/dL 1.18(H) 0.89 0.84  Sodium 135 - 145 mmol/L 141 142 141  Potassium 3.5 - 5.1 mmol/L 4.0 4.2 3.8  Chloride 98 - 111 mmol/L 106 105 106  CO2 22 - 32 mmol/L '22 29 26  ' Calcium 8.9 - 10.3 mg/dL 9.0 9.4 9.2  Total Protein 6.5 - 8.1 g/dL 6.9 6.8 6.7  Total Bilirubin 0.3 - 1.2 mg/dL 0.3 0.5  0.3  Alkaline Phos 38 - 126 U/L 116 116 117  AST 15 - 41 U/L '28 22 22  ' ALT 0 - 44 U/L '31 25 25      ' RADIOGRAPHIC STUDIES: I have personally reviewed the radiological images as listed and agreed with the findings in the report. No results found.   ASSESSMENT & PLAN:  [name] is a 58 y.o. female with   1. Malignant new overlapping sites of right breast, mpT2 pN1a, stage IA, G1, ER positive, PR positive, HER-2 negative, mammaprint low risk luminal type A -Diagnosed in 02/2016. Treated with bilateral mastectomy, and adjuvant radiation.  -She has a b/l mastectomy and does not need mammogram.  -She started antiestrogen therapy Exemestane on 04/01/16. Due to joint pain and hot flashes Exemestane was switched to Anastrozole in 06/2017. Toleratingmoderately wellwith hot flashes and moderate hand and back pain, weight gain and vaginal/oral dryness.  -Her side effects have not improved and it has been impact her life. I discussed switching to Tamoxifen which is slightly less effective than anastrozole but has less effect on her joints, and probably slightly safer if she wishes to use estrogen containing vaginal cream. I reviewed side effects such as increase risk of endometrial cancer (she is s/p hysterectomy, no risk), increased risk of blood clots, vaginal dryness. She is interested. I will prescribe Tamoxifen today  -From a breast cancer standpoint she is clinically doing well. Labs reviewed, CBC and CMP WNL except BG 116, Cr 1.18. Vit D Still pending. Physical exam unremarkable.  -I encouraged her to drink more water. I also encouraged her to  make sure she has regular bowel movement. She can increase fiber in diet or take stool softener.  -Virtual visit in 6 weeks   2. Insomnia -She was previously on Ambien, and attempted to take Benadryl. I previously advised her to discuss with her PCP.  3. Hot flashes -Currently on Effexor 225 mg daily. -Effexor has helped her mood swings butdoes not adequately control thehot flashes even at higher dose.  -She cannot tolerate Gabapentin over 180m nightly. Will continue 1029mHS, It helps some.  -Hot flashes stable and not completely controlled.   4. Dry mouth and vaginal dryness  -secondary to Anastrozole  -I again suggest vaginal lubricant use as needed. I advised her to avoid any estrogen containing creams.  -stable. -I discussed on Tamoxifen this can increase. Her Gyn mentioned platinum vaginal oil with her. She will discuss it further with her.   5. Weight gain  -secondary to anastrozole  -I instructed her to have a healthy diet with less carbohydrates and to exercise regularly to control her weight  6. H/o of smoking, asthma, dry cough and dyspnea on exertion  -she has history of smoking (1/2 ppd for several years) and asthma for which she use albuterol inhaler as needed  -this is likely related to her history of lung disease, I suggest she follow up with her PCP.   Plan -Stop anastrozole -I prescribed Tamoxifen today, she will start   -Virtual visit in 6 weeks, lab and office visit in 5 months    No problem-specific Assessment & Plan notes found for this encounter.   No orders of the defined types were placed in this encounter.  All questions were answered. The patient knows to call the clinic with any problems, questions or concerns. No barriers to learning was detected. I spent 20 minutes counseling the patient face to face. The total time spent in the appointment  was 25 minutes and more than 50% was on counseling and review of test results     Truitt Merle, MD  10/14/2018  I, Joslyn Devon, am acting as scribe for Truitt Merle, MD.   I have reviewed the above documentation for accuracy and completeness, and I agree with the above.

## 2018-10-14 ENCOUNTER — Inpatient Hospital Stay (HOSPITAL_BASED_OUTPATIENT_CLINIC_OR_DEPARTMENT_OTHER): Payer: BC Managed Care – PPO | Admitting: Hematology

## 2018-10-14 ENCOUNTER — Inpatient Hospital Stay: Payer: BC Managed Care – PPO | Attending: Hematology

## 2018-10-14 ENCOUNTER — Other Ambulatory Visit: Payer: Self-pay

## 2018-10-14 ENCOUNTER — Encounter: Payer: Self-pay | Admitting: Hematology

## 2018-10-14 VITALS — BP 143/87 | HR 97 | Temp 98.2°F | Resp 17 | Ht 65.0 in | Wt 178.5 lb

## 2018-10-14 DIAGNOSIS — C773 Secondary and unspecified malignant neoplasm of axilla and upper limb lymph nodes: Secondary | ICD-10-CM | POA: Diagnosis not present

## 2018-10-14 DIAGNOSIS — Z800 Family history of malignant neoplasm of digestive organs: Secondary | ICD-10-CM | POA: Insufficient documentation

## 2018-10-14 DIAGNOSIS — J45909 Unspecified asthma, uncomplicated: Secondary | ICD-10-CM | POA: Insufficient documentation

## 2018-10-14 DIAGNOSIS — Z87891 Personal history of nicotine dependence: Secondary | ICD-10-CM | POA: Insufficient documentation

## 2018-10-14 DIAGNOSIS — G4700 Insomnia, unspecified: Secondary | ICD-10-CM | POA: Insufficient documentation

## 2018-10-14 DIAGNOSIS — Z170 Estrogen receptor positive status [ER+]: Secondary | ICD-10-CM | POA: Insufficient documentation

## 2018-10-14 DIAGNOSIS — C50811 Malignant neoplasm of overlapping sites of right female breast: Secondary | ICD-10-CM

## 2018-10-14 DIAGNOSIS — Z79811 Long term (current) use of aromatase inhibitors: Secondary | ICD-10-CM | POA: Diagnosis not present

## 2018-10-14 DIAGNOSIS — N951 Menopausal and female climacteric states: Secondary | ICD-10-CM | POA: Diagnosis not present

## 2018-10-14 DIAGNOSIS — F419 Anxiety disorder, unspecified: Secondary | ICD-10-CM | POA: Diagnosis not present

## 2018-10-14 DIAGNOSIS — Z923 Personal history of irradiation: Secondary | ICD-10-CM | POA: Diagnosis not present

## 2018-10-14 DIAGNOSIS — Z9013 Acquired absence of bilateral breasts and nipples: Secondary | ICD-10-CM | POA: Insufficient documentation

## 2018-10-14 DIAGNOSIS — E039 Hypothyroidism, unspecified: Secondary | ICD-10-CM | POA: Insufficient documentation

## 2018-10-14 DIAGNOSIS — F329 Major depressive disorder, single episode, unspecified: Secondary | ICD-10-CM | POA: Insufficient documentation

## 2018-10-14 DIAGNOSIS — Z79899 Other long term (current) drug therapy: Secondary | ICD-10-CM | POA: Diagnosis not present

## 2018-10-14 DIAGNOSIS — Z8041 Family history of malignant neoplasm of ovary: Secondary | ICD-10-CM | POA: Insufficient documentation

## 2018-10-14 LAB — CBC WITH DIFFERENTIAL/PLATELET
Abs Immature Granulocytes: 0.02 10*3/uL (ref 0.00–0.07)
Basophils Absolute: 0.1 10*3/uL (ref 0.0–0.1)
Basophils Relative: 1 %
Eosinophils Absolute: 0.3 10*3/uL (ref 0.0–0.5)
Eosinophils Relative: 5 %
HCT: 41.1 % (ref 36.0–46.0)
Hemoglobin: 13.3 g/dL (ref 12.0–15.0)
Immature Granulocytes: 0 %
Lymphocytes Relative: 30 %
Lymphs Abs: 2.2 10*3/uL (ref 0.7–4.0)
MCH: 27.4 pg (ref 26.0–34.0)
MCHC: 32.4 g/dL (ref 30.0–36.0)
MCV: 84.7 fL (ref 80.0–100.0)
Monocytes Absolute: 0.7 10*3/uL (ref 0.1–1.0)
Monocytes Relative: 10 %
Neutro Abs: 4.1 10*3/uL (ref 1.7–7.7)
Neutrophils Relative %: 54 %
Platelets: 248 10*3/uL (ref 150–400)
RBC: 4.85 MIL/uL (ref 3.87–5.11)
RDW: 15.1 % (ref 11.5–15.5)
WBC: 7.4 10*3/uL (ref 4.0–10.5)
nRBC: 0 % (ref 0.0–0.2)

## 2018-10-14 LAB — COMPREHENSIVE METABOLIC PANEL
ALT: 31 U/L (ref 0–44)
AST: 28 U/L (ref 15–41)
Albumin: 4.1 g/dL (ref 3.5–5.0)
Alkaline Phosphatase: 116 U/L (ref 38–126)
Anion gap: 13 (ref 5–15)
BUN: 15 mg/dL (ref 6–20)
CO2: 22 mmol/L (ref 22–32)
Calcium: 9 mg/dL (ref 8.9–10.3)
Chloride: 106 mmol/L (ref 98–111)
Creatinine, Ser: 1.18 mg/dL — ABNORMAL HIGH (ref 0.44–1.00)
GFR calc Af Amer: 60 mL/min — ABNORMAL LOW (ref >60)
GFR calc non Af Amer: 52 mL/min — ABNORMAL LOW (ref >60)
Glucose, Bld: 116 mg/dL — ABNORMAL HIGH (ref 70–99)
Potassium: 4 mmol/L (ref 3.5–5.1)
Sodium: 141 mmol/L (ref 135–145)
Total Bilirubin: 0.3 mg/dL (ref 0.3–1.2)
Total Protein: 6.9 g/dL (ref 6.5–8.1)

## 2018-10-14 MED ORDER — TAMOXIFEN CITRATE 20 MG PO TABS: 20.0000 mg | ORAL_TABLET | Freq: Every day | ORAL | 2 refills | Status: DC

## 2018-10-15 ENCOUNTER — Encounter: Payer: Self-pay | Admitting: Hematology

## 2018-10-15 LAB — VITAMIN D 25 HYDROXY (VIT D DEFICIENCY, FRACTURES): Vit D, 25-Hydroxy: 34.6 ng/mL (ref 30.0–100.0)

## 2018-10-16 ENCOUNTER — Encounter: Payer: Self-pay | Admitting: Hematology

## 2018-10-17 ENCOUNTER — Telehealth: Payer: Self-pay | Admitting: Hematology

## 2018-10-17 NOTE — Telephone Encounter (Signed)
Scheduled appt per 8/21 los.  Spoke with patient and she is aware of the appt date and time.

## 2018-11-07 ENCOUNTER — Other Ambulatory Visit: Payer: Self-pay | Admitting: Hematology

## 2018-11-07 DIAGNOSIS — C50811 Malignant neoplasm of overlapping sites of right female breast: Secondary | ICD-10-CM

## 2018-11-07 DIAGNOSIS — Z170 Estrogen receptor positive status [ER+]: Secondary | ICD-10-CM

## 2018-11-22 NOTE — Progress Notes (Signed)
[place]   [phone] Fax:([phone]) [phone]   Clinic Follow up Note   Patient Care Team: Adria Dill Leonia Reader, FNP as PCP - General (Internal Medicine) [place], Laurice Record, MD (Inactive) (Orthopedic Surgery) Rutherford Guys, MD (Ophthalmology) Lavonna Monarch, MD (Dermatology) Truitt Merle, MD as Consulting Physician (Hematology) Rolm Bookbinder, MD as Consulting Physician (General Surgery) Kyung Rudd, MD as Consulting Physician (Radiation Oncology) Delice Bison, Charlestine Massed, NP as Nurse Practitioner (Hematology and Oncology)  Date of Service:  11/25/2018  CHIEF COMPLAINT: F/u on right breast cancer  SUMMARY OF ONCOLOGIC HISTORY: Oncology History Overview Note  Cancer Staging Malignant neoplasm of overlapping sites of right breast in female, estrogen receptor positive ([place]) Staging form: Breast, AJCC 8th Edition - Clinical stage from 03/23/2016: Stage IIA (cT3, cN1, cM0, G2, ER: Positive, PR: Positive, HER2: Negative) - Signed by Truitt Merle, MD on 03/31/2016 - Pathologic stage from 05/19/2016: Stage IA (pT2(m), pN1a, cM0, G1, ER: Positive, PR: Positive, HER2: Negative) - Signed by Truitt Merle, MD on 06/18/2016     Malignant neoplasm of overlapping sites of right breast in female, estrogen receptor positive ([place])  03/17/2016 Mammogram   B/l diagnostic mammogram and righ US showed a 3.6cm irregular mass in the right breast 11:00 position, posterior depth, there is a enlarged lymph node in the right axilla is highly suspicious for malignancy. additional 7 mm oval mass in the right breast lower outer quadrant is suspicious for malignancy.   03/23/2016 Initial Biopsy   Right breast 9:30 position biopsy showed invasive ductal carcinoma, grade 1. 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   03/23/2016 Receptors her2   Both breast and node biopsy tumor ER 100% positive, PR 70-95% positive, HER-2 negative, Ki-67 40%   03/23/2016 Initial Diagnosis   Malignant neoplasm of upper-outer quadrant of right breast in female, estrogen receptor positive ([place])   03/25/2016 Initial Biopsy   Right breast 11:00 position core needle biopsy showed invasive duct carcinoma, grade 2.    03/25/2016 Receptors her2   ER 95% positive, PR 90% positive, HER-2 negative, Ki-67 15%   03/25/2016 Miscellaneous   Mammaprint showed low risk type A with index +0.105   03/30/2016 Imaging   Bilateral breast MRI with and without contrast showed a large lobulated enhancing mass within the upper-outer and lower outer right breast with surrounding nodularity, measuring 6.1 x 4.4 x 5.6 cm. Multiple critically sick and right axillary lymph nodes are demonstrated measuring up to 1.5 cm.    04/01/2016 -  Anti-estrogen oral therapy   Exemestane 25 mg daily, plan for 7 years. Switched to Anastrozole 35m in 06/2017 due to joint pain and hot flashes. Due to persistent side effects I changed her to Tamoxifen in 09/2018.   05/19/2016 Surgery   Bilateral mastectomy and right axillary regional lymph node resection.   05/19/2016 Pathology Results   -Right axillary regional lymph node resection revealed metastatic carcinoma in 2/7 lymph nodes. -Left simple mastectomy revealed lobular neoplasia and fibrocystic changes with adenosis and calcifications. 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 -Skin of the right mastectomy flap was benign. -mpT2, pN1a   06/25/2016 - 08/11/2016 Radiation Therapy   Site/dose:    1. 4 field Right breast was treated to 50.4 Gy in 25 fractions at 1.8 Gy per fraction. 2. The Right breast was boosted to 10 Gy in 5  fractions at 2 Gy per fraction.   04/15/2017 Genetic Testing   The patient had genetic testing due to a personal history of breast cancer and family  history of breast, ovarian, and colon cancer. The Common Hereditary Cancer Panel was ordered.  The Common Hereditary Cancer Panel offered by [facility] includes sequencing and/or deletion duplication testing of the following 47 genes: APC, ATM, AXIN2, BARD1, BMPR1A, BRCA1, BRCA2, BRIP1, CDH1, CDKN2A (p14ARF), CDKN2A (p16INK4a), CKD4, CHEK2, CTNNA1, DICER1, EPCAM (Deletion/duplication testing only), GREM1 (promoter region deletion/duplication testing only), KIT, MEN1, MLH1, MSH2, MSH3, MSH6, MUTYH, NBN, NF1, NHTL1, PALB2, PDGFRA, PMS2, POLD1, POLE, PTEN, RAD50, RAD51C, RAD51D, SDHB, SDHC, SDHD, SMAD4, SMARCA4. STK11, TP53, TSC1, TSC2, and VHL.  The following genes were evaluated for sequence changes only: SDHA and HOXB13 c.251G>A variant only.  Results: Negative, no pathogenic variants identified. The date of this test report is 04/15/2017   06/28/2017 Surgery   COMPLEX REVISION OF BACK SCAR and LEFT BREAST RECONSTRUCTION WITH SILICONE IMPLANT EXCHANGE AND ACELLULARDERMIS TO LEFT CHEST by Dr. Iran Planas 06/28/17      CURRENT THERAPY:  Anastrozole 26m, startedin 06/2017. Due to persistent side effects I changed her to Tamoxifen in 09/2018.   INTERVAL HISTORY:  Katie MELKAis here for a follow up of right breast cancer. She presents to the clinic alone. She notes she has started Tamoxifen and more tolerable than anastrozole. Her hot flashes are not as significant. She notes she still takes both Effexor.  She also notes tightness in her right axilla. She feels at incision she feels it is leaking, but nothing there when she looks. She has normal ROM in right shoulder. She hopes to complete her breast reconstruction by end of year due to her deductible.    REVIEW OF SYSTEMS:   Constitutional: Denies fevers, chills or abnormal weight loss Eyes: Denies blurriness of vision Ears, nose, mouth, throat, and face: Denies mucositis or sore throat Respiratory: Denies cough, dyspnea or wheezes Cardiovascular:  Denies palpitation, chest discomfort or lower extremity swelling Gastrointestinal:  Denies nausea, heartburn or change in bowel habits Skin: Denies abnormal skin rashes MSK: (+) Tightness in right axilla.  Lymphatics: Denies new lymphadenopathy or easy bruising Neurological:Denies numbness, tingling or new weaknesses Behavioral/Psych: Mood is stable, no new changes  All other systems were reviewed with the patient and are negative.  MEDICAL HISTORY:  Past Medical History:  Diagnosis Date  . Anxiety   . Anxiety disorder   . Asthma    h/o asthma as a child  . [place]   . [place]   right breast cancer  . Cholelithiasis   . Chronic kidney disease    obstruction of R kidney, ( not a stone) - currently resolved   . Depression   . Diverticulosis   . DJD (degenerative joint disease)    hands & back   . Dyspnea    resolved since she stopped smoking   . Epigastric abdominal pain   . Esophageal stricture   . Family history of adverse reaction to anesthesia    daughter has N&V, takes long time to wake up   . Family history of colon cancer   . Family history of ovarian cancer   . Family hx of colon cancer   . 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 Ganglion cyst   . GERD (gastroesophageal reflux disease)   . Headache    low grade currently , family history of migraines   . Hemorrhoid   .  History of radiation therapy 05/03 - 08/11/2016   1. 4 field Right breast was treated to 50.4 Gy in 25 fractions at 1.8 Gy per fraction. 2. The Right breast was boosted to 10 Gy in 5 fractions at 2 Gy per fraction.  . Hypothyroidism   . Plantar fasciitis, bilateral   . PONV (postoperative nausea and vomiting)    gets anxious with the mask on her face, also remarks that the scop. patch has helped in the past       SURGICAL HISTORY: Past Surgical History:  Procedure Laterality Date  . ABDOMINAL HYSTERECTOMY    . BREAST RECONSTRUCTION WITH PLACEMENT OF TISSUE  EXPANDER AND FLEX HD (ACELLULAR HYDRATED DERMIS) Bilateral 05/19/2016   Procedure: BREAST RECONSTRUCTION WITH PLACEMENT OF TISSUE EXPANDER AND ALLODERM PLACEMENT;  Surgeon: Irene Limbo, MD;  Location: [facility];  Service: Plastics;  Laterality: Bilateral;  . BREAST RECONSTRUCTION WITH PLACEMENT OF TISSUE EXPANDER AND FLEX HD (ACELLULAR HYDRATED DERMIS) Left 06/28/2017   Procedure: LEFT BREAST RECONSTRUCTION WITH SILICONE IMPLANT EXCHANGE AND ACELLULARDERMIS TO LEFT CHEST;  Surgeon: Irene Limbo, MD;  Location: [facility];  Service: Plastics;  Laterality: Left;  . CESAREAN SECTION  1988  . CHOLECYSTECTOMY    . HEMORRHOID SURGERY    . LAPAROSCOPIC BILATERAL SALPINGECTOMY N/A 10/26/2012   Procedure: operative laparoscopy with lysis of adhesions;  Surgeon: Thornell Sartorius, MD;  Location: [facility];  Service: Gynecology;  Laterality: N/A;  . MASTECTOMY Bilateral   . MASTECTOMY WITH RADIOACTIVE SEED GUIDED EXCISION AND AXILLARY SENTINEL LYMPH NODE BIOPSY Bilateral 05/19/2016   Procedure: RIGHT SKIN SPARING MASTECTOMY WITH RIGHT RADIOACTIVE SEED TARGETED DISSECTION AND RIGHT SENTINEL LYMPH NODE BIOPSY, LEFT PROPHYLACTIC SKIN SPARING MASTECTOMY;  Surgeon: Rolm Bookbinder, MD;  Location: [facility];  Service: General;  Laterality: Bilateral;  . REMOVAL OF BILATERAL TISSUE EXPANDERS WITH PLACEMENT OF BILATERAL BREAST IMPLANTS Bilateral 01/29/2017   Procedure: REMOVAL OF BILATERAL TISSUE EXPANDERS WITH PLACEMENT OF BILATERAL SILICONE BREAST IMPLANTS, ALLODERM TO LEFT BREAST RECONSTRUCTION;  RIGHT LATISSUMUS FLAP;  Surgeon: Irene Limbo, MD;  Location: [name];  Service: Plastics;  Laterality: Bilateral;  Requesting RNFA  . SCAR REVISION Right 06/28/2017   Procedure: COMPLEX REVISION OF BACK SCAR;  Surgeon: Irene Limbo, MD;  Location: [facility];  Service: Plastics;  Laterality: Right;  . TONSILLECTOMY    . TUBAL LIGATION    . urologic surgery  for ureteropelvic junction obstruction      I have reviewed the social history and family history with the patient and they are unchanged from previous note.  ALLERGIES:  is allergic to adhesive [tape] and promethazine hcl.  MEDICATIONS:  Current Outpatient Medications  Medication Sig Dispense Refill  . acetaminophen (TYLENOL) 500 MG tablet Take 1,000 mg by mouth every 6 (six) hours as needed.    . ALPRAZolam (XANAX) 1 MG tablet Take 1 tablet (1 mg total) by mouth 3 (three) times daily. 90 tablet 0  . celecoxib (CELEBREX) 200 MG capsule Take 1 capsule (200 mg total) by mouth daily. 30 capsule 2  . gabapentin (NEURONTIN) 100 MG capsule TAKE 1 CAPSULE BY MOUTH AT NIGHT AND INCREASE BY 1 CAP EVERY 1 TO 2 WEEKS UNTIL 4 CAPS NIGHTLY (Patient taking differently: 100 mg at bedtime. Taking one a night) 90 capsule 0  . HYDROcodone-acetaminophen (NORCO/VICODIN) 5-325 MG tablet Take 1-2 tablets by mouth every 4 (four) hours as needed for moderate pain. 40 tablet 0  . levothyroxine (SYNTHROID, LEVOTHROID) 50 MCG tablet  Take 50 mcg by mouth daily before breakfast.    . methocarbamol (ROBAXIN) 500 MG tablet Take 1 tablet (500 mg total) by mouth every 8 (eight) hours as needed for muscle spasms. 30 tablet 0  . omeprazole (PRILOSEC) 40 MG capsule TAKE ONE CAPSULE BY MOUTH EVERY DAY 30 capsule 4  . tamoxifen (NOLVADEX) 20 MG tablet Take 1 tablet (20 mg total) by mouth daily. 90 tablet 3  . venlafaxine XR (EFFEXOR-XR) 150 MG 24 hr capsule TAKE 1 CAPSULE BY MOUTH EVERY DAY WITH BREAKFAST 90 capsule 0  . venlafaxine XR (EFFEXOR-XR) 75 MG 24 hr capsule Take 1 capsule (75 mg total) by mouth daily with breakfast. 90 capsule 2  . Cetirizine HCl 10 MG CAPS Take 1 capsule (10 mg total) by mouth daily for 10 days. 10 capsule 0  . DENTA 5000 PLUS 1.1 % CREA dental cream      No current facility-administered medications for this visit.     PHYSICAL EXAMINATION: ECOG PERFORMANCE STATUS: 0 - Asymptomatic  Vitals:    11/25/18 0937  BP: (!) 151/94  Pulse: 91  Resp: 18  Temp: 98 F (36.7 C)  SpO2: 99%   Filed Weights   11/25/18 0937  Weight: 177 lb (80.3 kg)    GENERAL:alert, no distress and comfortable SKIN: skin color, texture, turgor are normal, no rashes or significant lesions EYES: normal, Conjunctiva are pink and non-injected, sclera clear  NECK: supple, thyroid normal size, non-tender, without nodularity LYMPH:  no palpable lymphadenopathy in the cervical, axillary  LUNGS: clear to auscultation and percussion with normal breathing effort HEART: regular rate & rhythm and no murmurs and no lower extremity edema ABDOMEN:abdomen soft, non-tender and normal bowel sounds Musculoskeletal:no cyanosis of digits and no clubbing  NEURO: alert & oriented x 3 with fluent speech, no focal motor/sensory deficits BREAST: S/p b/l mastectomy and reconstruction with implants: Surgical incision healed well (+) Scar tissue and skin hyperpigmentation of right axilla. No palpable mass, nodules or adenopathy bilaterally. Breast exam benign.   LABORATORY DATA:  I have reviewed the data as listed CBC Latest Ref Rng & Units 10/14/2018 02/17/2018 10/18/2017  WBC 4.0 - 10.5 K/uL 7.4 5.3 5.7  Hemoglobin 12.0 - 15.0 g/dL 13.3 12.5 12.5  Hematocrit 36.0 - 46.0 % 41.1 39.9 38.7  Platelets 150 - 400 K/uL 248 244 242     CMP Latest Ref Rng & Units 10/14/2018 02/17/2018 10/18/2017  Glucose 70 - 99 mg/dL 116(H) 107(H) 110(H)  BUN 6 - 20 mg/dL '15 14 17  ' Creatinine 0.44 - 1.00 mg/dL 1.18(H) 0.89 0.84  Sodium 135 - 145 mmol/L 141 142 141  Potassium 3.5 - 5.1 mmol/L 4.0 4.2 3.8  Chloride 98 - 111 mmol/L 106 105 106  CO2 22 - 32 mmol/L '22 29 26  ' Calcium 8.9 - 10.3 mg/dL 9.0 9.4 9.2  Total Protein 6.5 - 8.1 g/dL 6.9 6.8 6.7  Total Bilirubin 0.3 - 1.2 mg/dL 0.3 0.5 0.3  Alkaline Phos 38 - 126 U/L 116 116 117  AST 15 - 41 U/L '28 22 22  ' ALT 0 - 44 U/L '31 25 25      ' RADIOGRAPHIC STUDIES: I have personally reviewed the  radiological images as listed and agreed with the findings in the report. No results found.   ASSESSMENT & PLAN:  EVANGELYNN LOCHRIDGE is a 58 y.o. female with    1. Malignant new overlapping sites of right breast, mpT2 pN1a, stage IA, G1, ER positive, PR positive, HER-2 negative,  mammaprint low risk luminal type A -Diagnosed in 02/2016. Treated with bilateral mastectomy and reconstruction with implants, and adjuvant radiation.  -She started antiestrogen therapy Exemestane on 04/01/16. Due to joint pain and hot flashes Exemestane was switched to Anastrozole in 06/2017. Toleratingmoderately wellwith hot flashesand moderate hand and back pain, weight gain and vaginal/oral dryness. Due to lack of improvement on side effects I switched her Tamoxifen in 09/2018. Side effects more tolerable now, will continue for complete 10 years.  -Per pt her 2020 DEXA with PCP was normal. Given she is on Tamoxifen will check every few years.  -I encouraged her to remain active and will hold Tamoxifen around any surgery given risk of blood clots.  -Pt requests to have her second reconstruction to symmetrical reduction surgery with Dr. Iran Planas before end of 2020. Will send message to her.  -She is clinically doing well. Her physical exam was unremarkable except mild scar tissue of right breast. There is no clinical concern for recurrence. -I discussed her scar tissue is the likely cause of her right axillary tightness and pain.  -Continue surveillance. She has a b/l mastectomy and does not need mammogram.  -Continue Tamoxifen.  -F/u in 4-5 months   2. Insomnia -She was previously on Ambien, and attempted to take Benadryl. I previously advised her to discuss with her PCP.  3. Hot flashes -Currently on Effexor 225 mg daily. -Effexor has helped her mood swings butdoes not adequately control thehot flashes even at higher dose.  -She cannot tolerate Gabapentin over 146m nightly. Will continue 1063mHS, It helps  some.  -More tolerable with Tamoxifen. She is still on both Effexor doses.    4. Weight gain  -secondary to anastrozole, stable  -I instructed her to have a healthy diet with less carbohydrates and to exercise regularly to control her weight   6. H/o of smoking,asthma, dry coughand dyspnea on exertion  -she hashistory of smoking (1/2 ppd for several years) and asthma for which she use albuterol inhaler as needed -this is likely related to her history of lung disease, I suggest she follow up with her PCP.   Plan -Copy note to Dr. ThIran Planasbout second reconstruction surgery in 2020.  -She is clinically doing well  -Continue Tamoxifen, refilled today  -Lab and f/u in 4-5 months   No problem-specific Assessment & Plan notes found for this encounter.   No orders of the defined types were placed in this encounter.  All questions were answered. The patient knows to call the clinic with any problems, questions or concerns. No barriers to learning was detected. I spent 15 minutes counseling the patient face to face. The total time spent in the appointment was 20 minutes and more than 50% was on counseling and review of test results     YaTruitt MerleMD 11/25/2018   I, AmJoslyn Devonam acting as scribe for YaTruitt MerleMD.   I have reviewed the above documentation for accuracy and completeness, and I agree with the above.

## 2018-11-25 ENCOUNTER — Other Ambulatory Visit: Payer: Self-pay

## 2018-11-25 ENCOUNTER — Inpatient Hospital Stay: Payer: BC Managed Care – PPO | Attending: Hematology | Admitting: Hematology

## 2018-11-25 ENCOUNTER — Encounter: Payer: Self-pay | Admitting: Hematology

## 2018-11-25 ENCOUNTER — Telehealth: Payer: Self-pay | Admitting: Hematology

## 2018-11-25 VITALS — BP 151/94 | HR 91 | Temp 98.0°F | Resp 18 | Ht 65.0 in | Wt 177.0 lb

## 2018-11-25 DIAGNOSIS — Z800 Family history of malignant neoplasm of digestive organs: Secondary | ICD-10-CM | POA: Insufficient documentation

## 2018-11-25 DIAGNOSIS — C50811 Malignant neoplasm of overlapping sites of right female breast: Secondary | ICD-10-CM | POA: Diagnosis not present

## 2018-11-25 DIAGNOSIS — F419 Anxiety disorder, unspecified: Secondary | ICD-10-CM | POA: Insufficient documentation

## 2018-11-25 DIAGNOSIS — G4700 Insomnia, unspecified: Secondary | ICD-10-CM | POA: Insufficient documentation

## 2018-11-25 DIAGNOSIS — C773 Secondary and unspecified malignant neoplasm of axilla and upper limb lymph nodes: Secondary | ICD-10-CM | POA: Insufficient documentation

## 2018-11-25 DIAGNOSIS — Z90710 Acquired absence of both cervix and uterus: Secondary | ICD-10-CM | POA: Diagnosis not present

## 2018-11-25 DIAGNOSIS — Z87891 Personal history of nicotine dependence: Secondary | ICD-10-CM | POA: Diagnosis not present

## 2018-11-25 DIAGNOSIS — Z79810 Long term (current) use of selective estrogen receptor modulators (SERMs): Secondary | ICD-10-CM | POA: Insufficient documentation

## 2018-11-25 DIAGNOSIS — Z170 Estrogen receptor positive status [ER+]: Secondary | ICD-10-CM | POA: Insufficient documentation

## 2018-11-25 DIAGNOSIS — Z79899 Other long term (current) drug therapy: Secondary | ICD-10-CM | POA: Insufficient documentation

## 2018-11-25 DIAGNOSIS — Z923 Personal history of irradiation: Secondary | ICD-10-CM | POA: Diagnosis not present

## 2018-11-25 DIAGNOSIS — E039 Hypothyroidism, unspecified: Secondary | ICD-10-CM | POA: Insufficient documentation

## 2018-11-25 MED ORDER — TAMOXIFEN CITRATE 20 MG PO TABS: 20.0000 mg | ORAL_TABLET | Freq: Every day | ORAL | 3 refills | Status: DC

## 2018-11-25 NOTE — Telephone Encounter (Signed)
Scheduled per 10/02 los, patient will be notified of appointments on My Chart.

## 2018-12-07 DIAGNOSIS — Z853 Personal history of malignant neoplasm of breast: Secondary | ICD-10-CM | POA: Diagnosis not present

## 2018-12-07 DIAGNOSIS — Z923 Personal history of irradiation: Secondary | ICD-10-CM | POA: Diagnosis not present

## 2018-12-07 DIAGNOSIS — Z9013 Acquired absence of bilateral breasts and nipples: Secondary | ICD-10-CM | POA: Diagnosis not present

## 2018-12-14 ENCOUNTER — Telehealth: Payer: Self-pay

## 2018-12-14 NOTE — Telephone Encounter (Signed)
Spoke with patient regarding Tamoxifen.  Per Dr. Burr Medico instructed her to stop it 3 weeks prior to her scheduled surgery on 12/1 and restart it 3 weeks after if she is recovering well.  The patient verbalized an understanding.

## 2018-12-14 NOTE — Telephone Encounter (Signed)
Please let her hold Temoxifen 3 weeks before surgery and restart 3 weeks after if she recovers well.   Truitt Merle MD

## 2018-12-14 NOTE — Telephone Encounter (Signed)
Patient calls stating she is having surgery on 12/1 revision of reconstruction, wants to know if she has to be off the Tamoxifen prior to surgery and if so for how long?

## 2018-12-19 DIAGNOSIS — Z9013 Acquired absence of bilateral breasts and nipples: Secondary | ICD-10-CM | POA: Diagnosis not present

## 2018-12-19 DIAGNOSIS — Z923 Personal history of irradiation: Secondary | ICD-10-CM | POA: Diagnosis not present

## 2018-12-19 DIAGNOSIS — Z853 Personal history of malignant neoplasm of breast: Secondary | ICD-10-CM | POA: Diagnosis not present

## 2018-12-21 ENCOUNTER — Other Ambulatory Visit: Payer: Self-pay | Admitting: Hematology

## 2018-12-21 DIAGNOSIS — C50811 Malignant neoplasm of overlapping sites of right female breast: Secondary | ICD-10-CM

## 2019-01-11 ENCOUNTER — Other Ambulatory Visit: Payer: Self-pay

## 2019-01-11 ENCOUNTER — Encounter (HOSPITAL_BASED_OUTPATIENT_CLINIC_OR_DEPARTMENT_OTHER): Payer: Self-pay

## 2019-01-12 NOTE — Progress Notes (Signed)

## 2019-01-20 ENCOUNTER — Other Ambulatory Visit (HOSPITAL_COMMUNITY): Payer: BC Managed Care – PPO | Attending: Plastic Surgery

## 2019-01-21 ENCOUNTER — Other Ambulatory Visit (HOSPITAL_COMMUNITY)
Admission: RE | Admit: 2019-01-21 | Discharge: 2019-01-21 | Disposition: A | Discharge status: Home / Self Care | Payer: BC Managed Care – PPO | Source: Ambulatory Visit | Attending: Plastic Surgery | Admitting: Plastic Surgery

## 2019-01-21 DIAGNOSIS — Z01812 Encounter for preprocedural laboratory examination: Secondary | ICD-10-CM | POA: Insufficient documentation

## 2019-01-21 DIAGNOSIS — Z20828 Contact with and (suspected) exposure to other viral communicable diseases: Secondary | ICD-10-CM | POA: Insufficient documentation

## 2019-01-22 LAB — NOVEL CORONAVIRUS, NAA (HOSP ORDER, SEND-OUT TO REF LAB; TAT 18-24 HRS): SARS-CoV-2, NAA: NOT DETECTED

## 2019-01-24 ENCOUNTER — Encounter (HOSPITAL_BASED_OUTPATIENT_CLINIC_OR_DEPARTMENT_OTHER): Payer: Self-pay

## 2019-01-24 ENCOUNTER — Encounter (HOSPITAL_BASED_OUTPATIENT_CLINIC_OR_DEPARTMENT_OTHER)
Admission: RE | Disposition: A | Discharge status: Home / Self Care | Payer: Self-pay | Source: Home / Self Care | Attending: Plastic Surgery

## 2019-01-24 ENCOUNTER — Other Ambulatory Visit: Payer: Self-pay

## 2019-01-24 ENCOUNTER — Ambulatory Visit (HOSPITAL_BASED_OUTPATIENT_CLINIC_OR_DEPARTMENT_OTHER)
Admission: RE | Admit: 2019-01-24 | Discharge: 2019-01-24 | Disposition: A | Discharge status: Home / Self Care | Payer: BC Managed Care – PPO | Attending: Plastic Surgery | Admitting: Plastic Surgery

## 2019-01-24 ENCOUNTER — Ambulatory Visit (HOSPITAL_BASED_OUTPATIENT_CLINIC_OR_DEPARTMENT_OTHER): Payer: BC Managed Care – PPO | Admitting: Anesthesiology

## 2019-01-24 DIAGNOSIS — Z87891 Personal history of nicotine dependence: Secondary | ICD-10-CM | POA: Diagnosis not present

## 2019-01-24 DIAGNOSIS — Z170 Estrogen receptor positive status [ER+]: Secondary | ICD-10-CM | POA: Diagnosis not present

## 2019-01-24 DIAGNOSIS — C773 Secondary and unspecified malignant neoplasm of axilla and upper limb lymph nodes: Secondary | ICD-10-CM | POA: Insufficient documentation

## 2019-01-24 DIAGNOSIS — K219 Gastro-esophageal reflux disease without esophagitis: Secondary | ICD-10-CM | POA: Diagnosis not present

## 2019-01-24 DIAGNOSIS — C50411 Malignant neoplasm of upper-outer quadrant of right female breast: Secondary | ICD-10-CM | POA: Diagnosis not present

## 2019-01-24 DIAGNOSIS — G4730 Sleep apnea, unspecified: Secondary | ICD-10-CM | POA: Insufficient documentation

## 2019-01-24 DIAGNOSIS — Z9013 Acquired absence of bilateral breasts and nipples: Secondary | ICD-10-CM | POA: Diagnosis not present

## 2019-01-24 DIAGNOSIS — E039 Hypothyroidism, unspecified: Secondary | ICD-10-CM | POA: Diagnosis not present

## 2019-01-24 DIAGNOSIS — Z923 Personal history of irradiation: Secondary | ICD-10-CM | POA: Insufficient documentation

## 2019-01-24 DIAGNOSIS — Z853 Personal history of malignant neoplasm of breast: Secondary | ICD-10-CM | POA: Diagnosis not present

## 2019-01-24 HISTORY — PX: BREAST IMPLANT REMOVAL: SHX5361

## 2019-01-24 HISTORY — PX: CAPSULECTOMY: SHX5381

## 2019-01-24 SURGERY — REMOVAL, IMPLANT, BREAST
Anesthesia: General | Site: Breast | Laterality: Bilateral

## 2019-01-24 MED ORDER — BUPIVACAINE HCL (PF) 0.25 % IJ SOLN
INTRAMUSCULAR | Status: DC | PRN
  Administered 2019-01-24: 30 mL

## 2019-01-24 MED ORDER — FENTANYL CITRATE (PF) 100 MCG/2ML IJ SOLN
INTRAMUSCULAR | Status: AC
  Filled 2019-01-24: qty 2

## 2019-01-24 MED ORDER — ONDANSETRON HCL 4 MG/2ML IJ SOLN
INTRAMUSCULAR | Status: AC
  Filled 2019-01-24: qty 2

## 2019-01-24 MED ORDER — GABAPENTIN 300 MG PO CAPS
ORAL_CAPSULE | ORAL | Status: AC
  Filled 2019-01-24: qty 1

## 2019-01-24 MED ORDER — MIDAZOLAM HCL 2 MG/2ML IJ SOLN
INTRAMUSCULAR | Status: AC
  Filled 2019-01-24: qty 2

## 2019-01-24 MED ORDER — OXYCODONE HCL 5 MG PO TABS
ORAL_TABLET | ORAL | Status: AC
  Filled 2019-01-24: qty 1

## 2019-01-24 MED ORDER — DIPHENHYDRAMINE HCL 50 MG/ML IJ SOLN
INTRAMUSCULAR | Status: AC
  Filled 2019-01-24: qty 1

## 2019-01-24 MED ORDER — DEXAMETHASONE SODIUM PHOSPHATE 4 MG/ML IJ SOLN
INTRAMUSCULAR | Status: DC | PRN
  Administered 2019-01-24: 10 mg via INTRAVENOUS

## 2019-01-24 MED ORDER — LACTATED RINGERS IV SOLN
INTRAVENOUS | Status: DC
  Administered 2019-01-24 (×2): via INTRAVENOUS

## 2019-01-24 MED ORDER — CEFAZOLIN SODIUM-DEXTROSE 2-4 GM/100ML-% IV SOLN
INTRAVENOUS | Status: AC
  Filled 2019-01-24: qty 100

## 2019-01-24 MED ORDER — LIDOCAINE HCL (CARDIAC) PF 100 MG/5ML IV SOSY
PREFILLED_SYRINGE | INTRAVENOUS | Status: DC | PRN
  Administered 2019-01-24: 75 mg via INTRAVENOUS

## 2019-01-24 MED ORDER — GABAPENTIN 300 MG PO CAPS
300.0000 mg | ORAL_CAPSULE | ORAL | Status: AC
  Administered 2019-01-24: 300 mg via ORAL

## 2019-01-24 MED ORDER — ONDANSETRON HCL 4 MG/2ML IJ SOLN: 4.0000 mg | Freq: Once | INTRAMUSCULAR | Status: DC | PRN

## 2019-01-24 MED ORDER — CEFAZOLIN SODIUM-DEXTROSE 2-4 GM/100ML-% IV SOLN
2.0000 g | INTRAVENOUS | Status: AC
  Administered 2019-01-24: 2 g via INTRAVENOUS

## 2019-01-24 MED ORDER — LIDOCAINE 2% (20 MG/ML) 5 ML SYRINGE
INTRAMUSCULAR | Status: AC
  Filled 2019-01-24: qty 5

## 2019-01-24 MED ORDER — PHENYLEPHRINE HCL (PRESSORS) 10 MG/ML IV SOLN
INTRAVENOUS | Status: DC | PRN
  Administered 2019-01-24: 40 ug via INTRAVENOUS

## 2019-01-24 MED ORDER — FENTANYL CITRATE (PF) 100 MCG/2ML IJ SOLN: 50.0000 ug | INTRAMUSCULAR | Status: DC | PRN

## 2019-01-24 MED ORDER — OXYCODONE HCL 5 MG PO TABS: 5.0000 mg | ORAL_TABLET | Freq: Four times a day (QID) | ORAL | 0 refills | Status: DC | PRN

## 2019-01-24 MED ORDER — SCOPOLAMINE 1 MG/3DAYS TD PT72
MEDICATED_PATCH | TRANSDERMAL | Status: AC
  Filled 2019-01-24: qty 1

## 2019-01-24 MED ORDER — FENTANYL CITRATE (PF) 100 MCG/2ML IJ SOLN
25.0000 ug | INTRAMUSCULAR | Status: DC | PRN
  Administered 2019-01-24 (×2): 50 ug via INTRAVENOUS

## 2019-01-24 MED ORDER — DEXAMETHASONE SODIUM PHOSPHATE 10 MG/ML IJ SOLN
INTRAMUSCULAR | Status: AC
  Filled 2019-01-24: qty 1

## 2019-01-24 MED ORDER — ONDANSETRON HCL 4 MG/2ML IJ SOLN
INTRAMUSCULAR | Status: DC | PRN
  Administered 2019-01-24: 4 mg via INTRAVENOUS

## 2019-01-24 MED ORDER — PROPOFOL 10 MG/ML IV BOLUS
INTRAVENOUS | Status: DC | PRN
  Administered 2019-01-24: 50 mg via INTRAVENOUS
  Administered 2019-01-24: 150 mg via INTRAVENOUS

## 2019-01-24 MED ORDER — CELECOXIB 200 MG PO CAPS
ORAL_CAPSULE | ORAL | Status: AC
  Filled 2019-01-24: qty 1

## 2019-01-24 MED ORDER — PROPOFOL 500 MG/50ML IV EMUL
INTRAVENOUS | Status: DC | PRN
  Administered 2019-01-24: 25 ug/kg/min via INTRAVENOUS

## 2019-01-24 MED ORDER — DIPHENHYDRAMINE HCL 50 MG/ML IJ SOLN
INTRAMUSCULAR | Status: DC | PRN
  Administered 2019-01-24: 12.5 mg via INTRAVENOUS

## 2019-01-24 MED ORDER — CELECOXIB 200 MG PO CAPS
200.0000 mg | ORAL_CAPSULE | ORAL | Status: AC
  Administered 2019-01-24: 200 mg via ORAL

## 2019-01-24 MED ORDER — MIDAZOLAM HCL 2 MG/2ML IJ SOLN: 0.5000 mg | INTRAMUSCULAR | Status: DC | PRN

## 2019-01-24 MED ORDER — FENTANYL CITRATE (PF) 100 MCG/2ML IJ SOLN
INTRAMUSCULAR | Status: DC | PRN
  Administered 2019-01-24: 50 ug via INTRAVENOUS
  Administered 2019-01-24: 25 ug via INTRAVENOUS
  Administered 2019-01-24: 100 ug via INTRAVENOUS
  Administered 2019-01-24: 25 ug via INTRAVENOUS

## 2019-01-24 MED ORDER — SCOPOLAMINE 1 MG/3DAYS TD PT72
1.0000 | MEDICATED_PATCH | Freq: Once | TRANSDERMAL | Status: DC
  Administered 2019-01-24: 1.5 mg via TRANSDERMAL

## 2019-01-24 MED ORDER — ACETAMINOPHEN 500 MG PO TABS
ORAL_TABLET | ORAL | Status: AC
  Filled 2019-01-24: qty 2

## 2019-01-24 MED ORDER — ACETAMINOPHEN 500 MG PO TABS
1000.0000 mg | ORAL_TABLET | ORAL | Status: AC
  Administered 2019-01-24: 1000 mg via ORAL

## 2019-01-24 MED ORDER — OXYCODONE HCL 5 MG PO TABS
5.0000 mg | ORAL_TABLET | Freq: Once | ORAL | Status: AC | PRN
  Administered 2019-01-24: 5 mg via ORAL

## 2019-01-24 MED ORDER — MIDAZOLAM HCL 5 MG/5ML IJ SOLN
INTRAMUSCULAR | Status: DC | PRN
  Administered 2019-01-24: 2 mg via INTRAVENOUS

## 2019-01-24 MED ORDER — METHOCARBAMOL 500 MG PO TABS: 500.0000 mg | ORAL_TABLET | Freq: Three times a day (TID) | ORAL | 0 refills | Status: DC | PRN

## 2019-01-24 MED ORDER — PROPOFOL 10 MG/ML IV BOLUS
INTRAVENOUS | Status: AC
  Filled 2019-01-24: qty 20

## 2019-01-24 SURGICAL SUPPLY — 71 items
ADH SKN CLS APL DERMABOND .7 (GAUZE/BANDAGES/DRESSINGS) ×2
APL PRP STRL LF DISP 70% ISPRP (MISCELLANEOUS) ×2
BINDER BREAST LRG (GAUZE/BANDAGES/DRESSINGS) IMPLANT
BINDER BREAST MEDIUM (GAUZE/BANDAGES/DRESSINGS) IMPLANT
BINDER BREAST XLRG (GAUZE/BANDAGES/DRESSINGS) ×2 IMPLANT
BINDER BREAST XXLRG (GAUZE/BANDAGES/DRESSINGS) IMPLANT
BLADE SURG 10 STRL SS (BLADE) ×6 IMPLANT
BLADE SURG 15 STRL LF DISP TIS (BLADE) ×1 IMPLANT
BLADE SURG 15 STRL SS (BLADE) ×3
CANISTER SUCT 1200ML W/VALVE (MISCELLANEOUS) ×3 IMPLANT
CHLORAPREP W/TINT 26 (MISCELLANEOUS) ×6 IMPLANT
CLOSURE WOUND 1/2 X4 (GAUZE/BANDAGES/DRESSINGS)
COVER BACK TABLE REUSABLE LG (DRAPES) ×3 IMPLANT
COVER MAYO STAND REUSABLE (DRAPES) ×3 IMPLANT
COVER WAND RF STERILE (DRAPES) IMPLANT
DECANTER SPIKE VIAL GLASS SM (MISCELLANEOUS) IMPLANT
DERMABOND ADVANCED (GAUZE/BANDAGES/DRESSINGS) ×4
DERMABOND ADVANCED .7 DNX12 (GAUZE/BANDAGES/DRESSINGS) ×2 IMPLANT
DRAIN CHANNEL 15F RND FF W/TCR (WOUND CARE) IMPLANT
DRAIN CHANNEL 19F RND (DRAIN) ×4 IMPLANT
DRAPE HALF SHEET 70X43 (DRAPES) ×3 IMPLANT
DRAPE INCISE IOBAN 66X45 STRL (DRAPES) IMPLANT
DRAPE TOP ARMCOVERS (MISCELLANEOUS) ×3 IMPLANT
DRAPE U-SHAPE 76X120 STRL (DRAPES) ×3 IMPLANT
DRAPE UTILITY XL STRL (DRAPES) ×3 IMPLANT
DRSG PAD ABDOMINAL 8X10 ST (GAUZE/BANDAGES/DRESSINGS) ×9 IMPLANT
ELECT BLADE 4.0 EZ CLEAN MEGAD (MISCELLANEOUS) ×3
ELECT BLADE 6.5 EXT (BLADE) IMPLANT
ELECT COATED BLADE 2.86 ST (ELECTRODE) ×3 IMPLANT
ELECT REM PT RETURN 9FT ADLT (ELECTROSURGICAL) ×3
ELECTRODE BLDE 4.0 EZ CLN MEGD (MISCELLANEOUS) ×1 IMPLANT
ELECTRODE REM PT RTRN 9FT ADLT (ELECTROSURGICAL) ×1 IMPLANT
EVACUATOR SILICONE 100CC (DRAIN) ×6 IMPLANT
GAUZE SPONGE 4X4 12PLY STRL (GAUZE/BANDAGES/DRESSINGS) IMPLANT
GAUZE SPONGE 4X4 12PLY STRL LF (GAUZE/BANDAGES/DRESSINGS) IMPLANT
GLOVE BIO SURGEON STRL SZ 6 (GLOVE) ×6 IMPLANT
GLOVE BIO SURGEON STRL SZ 6.5 (GLOVE) ×1 IMPLANT
GLOVE BIO SURGEON STRL SZ7 (GLOVE) ×6 IMPLANT
GLOVE BIO SURGEONS STRL SZ 6.5 (GLOVE) ×1
GLOVE BIOGEL M 6.5 STRL (GLOVE) ×3 IMPLANT
GLOVE BIOGEL PI IND STRL 7.0 (GLOVE) ×2 IMPLANT
GLOVE BIOGEL PI INDICATOR 7.0 (GLOVE) ×4
GOWN STRL REUS W/ TWL LRG LVL3 (GOWN DISPOSABLE) ×4 IMPLANT
GOWN STRL REUS W/TWL LRG LVL3 (GOWN DISPOSABLE) ×12
MARKER SKIN DUAL TIP RULER LAB (MISCELLANEOUS) IMPLANT
NEEDLE HYPO 25X1 1.5 SAFETY (NEEDLE) ×3 IMPLANT
NS IRRIG 1000ML POUR BTL (IV SOLUTION) IMPLANT
PACK BASIN DAY SURGERY FS (CUSTOM PROCEDURE TRAY) ×3 IMPLANT
PENCIL SMOKE EVACUATOR (MISCELLANEOUS) ×3 IMPLANT
PIN SAFETY STERILE (MISCELLANEOUS) ×3 IMPLANT
SLEEVE SCD COMPRESS KNEE MED (MISCELLANEOUS) ×3 IMPLANT
SPONGE LAP 18X18 RF (DISPOSABLE) ×6 IMPLANT
STAPLER VISISTAT 35W (STAPLE) ×3 IMPLANT
STRIP CLOSURE SKIN 1/2X4 (GAUZE/BANDAGES/DRESSINGS) IMPLANT
SUT ETHILON 2 0 FS 18 (SUTURE) ×3 IMPLANT
SUT MNCRL AB 4-0 PS2 18 (SUTURE) ×3 IMPLANT
SUT VIC AB 3-0 PS1 18 (SUTURE)
SUT VIC AB 3-0 PS1 18XBRD (SUTURE) IMPLANT
SUT VIC AB 3-0 SH 27 (SUTURE) ×15
SUT VIC AB 3-0 SH 27X BRD (SUTURE) IMPLANT
SUT VICRYL 4-0 PS2 18IN ABS (SUTURE) ×5 IMPLANT
SUT VLOC 180 0 24IN GS25 (SUTURE) ×6 IMPLANT
SWAB COLLECTION DEVICE MRSA (MISCELLANEOUS) IMPLANT
SYR 50ML LL SCALE MARK (SYRINGE) IMPLANT
SYR BULB IRRIGATION 50ML (SYRINGE) ×3 IMPLANT
SYR CONTROL 10ML LL (SYRINGE) IMPLANT
TOWEL GREEN STERILE FF (TOWEL DISPOSABLE) ×6 IMPLANT
TUBE CONNECTING 20'X1/4 (TUBING) ×1
TUBE CONNECTING 20X1/4 (TUBING) ×2 IMPLANT
UNDERPAD 30X36 HEAVY ABSORB (UNDERPADS AND DIAPERS) ×6 IMPLANT
YANKAUER SUCT BULB TIP NO VENT (SUCTIONS) ×3 IMPLANT

## 2019-01-24 NOTE — Discharge Instructions (Signed)
May take Tylenol again at 4 PM. Can take Oxycodone again at 8 PM.  Post Anesthesia Home Care Instructions  Activity: Get plenty of rest for the remainder of the day. A responsible individual must stay with you for 24 hours following the procedure.  For the next 24 hours, DO NOT: -Drive a car -Paediatric nurse -Drink alcoholic beverages -Take any medication unless instructed by your physician -Make any legal decisions or sign important papers.  Meals: Start with liquid foods such as gelatin or soup. Progress to regular foods as tolerated. Avoid greasy, spicy, heavy foods. If nausea and/or vomiting occur, drink only clear liquids until the nausea and/or vomiting subsides. Call your physician if vomiting continues.  Special Instructions/Symptoms: Your throat may feel dry or sore from the anesthesia or the breathing tube placed in your throat during surgery. If this causes discomfort, gargle with warm salt water. The discomfort should disappear within 24 hours.  If you had a scopolamine patch placed behind your ear for the management of post- operative nausea and/or vomiting:  1. The medication in the patch is effective for 72 hours, after which it should be removed.  Wrap patch in a tissue and discard in the trash. Wash hands thoroughly with soap and water. 2. You may remove the patch earlier than 72 hours if you experience unpleasant side effects which may include dry mouth, dizziness or visual disturbances. 3. Avoid touching the patch. Wash your hands with soap and water after contact with the patch.      About my Jackson-Pratt Bulb Drain  What is a Jackson-Pratt bulb? A Jackson-Pratt is a soft, round device used to collect drainage. It is connected to a long, thin drainage catheter, which is held in place by one or two small stiches near your surgical incision site. When the bulb is squeezed, it forms a vacuum, forcing the drainage to empty into the bulb.  Emptying the Jackson-Pratt  bulb- To empty the bulb: 1. Release the plug on the top of the bulb. 2. Pour the bulb's contents into a measuring container which your nurse will provide. 3. Record the time emptied and amount of drainage. Empty the drain(s) as often as your     doctor or nurse recommends.  Date                  Time                    Amount (Drain 1)                 Amount (Drain 2)  _____________________________________________________________________  _____________________________________________________________________  _____________________________________________________________________  _____________________________________________________________________  _____________________________________________________________________  _____________________________________________________________________  _____________________________________________________________________  _____________________________________________________________________  Squeezing the Jackson-Pratt Bulb- To squeeze the bulb: 1. Make sure the plug at the top of the bulb is open. 2. Squeeze the bulb tightly in your fist. You will hear air squeezing from the bulb. 3. Replace the plug while the bulb is squeezed. 4. Use a safety pin to attach the bulb to your clothing. This will keep the catheter from     pulling at the bulb insertion site.  When to call your doctor- Call your doctor if:  Drain site becomes red, swollen or hot.  You have a fever greater than 101 degrees F.  There is oozing at the drain site.  Drain falls out (apply a guaze bandage over the drain hole and secure it with tape).  Drainage increases daily not related to activity patterns. (You will  usually have more drainage when you are active than when you are resting.)  Drainage has a bad odor.

## 2019-01-24 NOTE — Transfer of Care (Signed)
Immediate Anesthesia Transfer of Care Note  Patient: [name]  Procedure(s) Performed: REMOVAL BREAST IMPLANTS (Bilateral Breast) CAPSULECTOMY (Bilateral Breast)  Patient Location: PACU  Anesthesia Type:General  Level of Consciousness: awake and patient cooperative  Airway & Oxygen Therapy: Patient Spontanous Breathing and Patient connected to face mask oxygen  Post-op Assessment: Report given to RN and Post -op Vital signs reviewed and stable  Post vital signs: Reviewed and stable  Last Vitals:  Vitals Value Taken Time  BP    Temp    Pulse    Resp    SpO2      Last Pain:  Vitals:   01/24/19 0941  TempSrc: Oral  PainSc: 0-No pain         Complications: No apparent anesthesia complications

## 2019-01-24 NOTE — Anesthesia Procedure Notes (Signed)
Procedure Name: LMA Insertion Date/Time: 01/24/2019 11:15 AM Performed by: Marrianne Mood, CRNA Pre-anesthesia Checklist: Patient identified, Emergency Drugs available, Suction available, Patient being monitored and Timeout performed Patient Re-evaluated:Patient Re-evaluated prior to induction Oxygen Delivery Method: Circle system utilized Preoxygenation: Pre-oxygenation with 100% oxygen Induction Type: IV induction Ventilation: Mask ventilation without difficulty LMA: LMA inserted LMA Size: 4.0 Number of attempts: 1 Airway Equipment and Method: Bite block Placement Confirmation: positive ETCO2 Tube secured with: Tape Dental Injury: Teeth and Oropharynx as per pre-operative assessment

## 2019-01-24 NOTE — H&P (Signed)
Subjective:     Patient ID: [name] is a 58 y.o. female.   2.5 years from bilateral mastectomies with immediate dual plane breast reconstruction. Underwent implant exchange with latissimus flap over right side and followed by revision to left breast reconstruction. Last seen 2 weeks ago and plan made for revision left breast reconstruction. Patient returns to discuss removal implants.  Presented with palpable right breast lump with associated pain. Diagnostic MMG and right US showed a 3.6 cm irregular mass in the right breast 11:00 position, with an enlarged lymph node in the right axilla. Additional 7 mm oval mass in the right breast LOQ present and suspicious for malignancy.  Biopsy of right breast at 9:30 o'clock position showed invasive mammary carcinoma. Biopsy of the right breast at the 11:00 o'clock position showed fibrocystic change and dense fibrosis with no malignancy identified. Right lymph node was positive for metastatic mammary carcinoma. Repeat biopsy of the right breast at the 11:00 o'clock position showed invasive mammary carcinoma. All ER/PR+, Her2-.  MRI howed large irregular masslike area of enhancement within the upper outer and lower outer right breast compatible with biopsy-proven malignancy with enlarged right axillary lymph nodes compatible with metastatic adenopathy.  Mammaprint low risk.  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 Completed adjuvant radiation 6.19.18. On anastrazole.  Prior C or D. Right mastectomy 731 left 713 g  Former smoker- quit prior to surgery.     Objective:   Physical Exam  Cardiovascular: Normal rate, regular rhythm and normal heart sounds.  Pulmonary/Chest: Effort normal and breath sounds normal.  Lymphadenopathy:    She has no axillary adenopathy.  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 S/p revision scar left chest, back.    Plan:     Patient states that she does not want more surgery related to implants as she gets older. States she would prefer removal and has discussed this with family. Offered her referral to micosurgeon prior to having this done to learn options. She understands these type of surgeries not offered in [place]. States it would be a burden on herself and family to travel for this type of surgery. Reviewed pictures of mastectomy alone and confirmed she would be ok with this. Reviewed utilization of current scars to remove redundant skin, capsulectomy to prevent seroma, risks wound healing problems especially in radiated setting. May still have asymmetries chest wall as there is presence of latissimus muscle on right. Reviewed if she elected for implants in future, would have to start process over again. She confirms she desires removal. Asking if scar tissue on right IMF will be removed, stated yes but process of surgery will produce additional scar.  Plan OP surgery. Reviewed drains. With regards to work anticipate 1-2 week leave as she is working from home.   Discussed risk COVID infectionthrough this elective surgery. Patient will receive COVID testing prior to surgery. Discussed even if patient receivesa negative test result, the tests in some cases may fail to  detect the virus or patient maycontract COVID after the test.COVID 19 infectionbefore/during/aftersurgery may result in lead to a higher chance of complication  and death.   Natrelle Inspira Smooth Round Extra Projection 560 ml implants placed bilateral [id number] [id number]   Irene Limbo, MD [facility]

## 2019-01-24 NOTE — Anesthesia Postprocedure Evaluation (Signed)
Anesthesia Post Note  Patient: [name]  Procedure(s) Performed: REMOVAL BREAST IMPLANTS (Bilateral Breast) CAPSULECTOMY (Bilateral Breast)     Patient location during evaluation: PACU Anesthesia Type: General Level of consciousness: awake and alert Pain management: pain level controlled Vital Signs Assessment: post-procedure vital signs reviewed and stable Respiratory status: spontaneous breathing, nonlabored ventilation and respiratory function stable Cardiovascular status: blood pressure returned to baseline and stable Postop Assessment: no apparent nausea or vomiting Anesthetic complications: no    Last Vitals:  Vitals:   01/24/19 1315 01/24/19 1330  BP: (!) 154/87 132/88  Pulse: 99 95  Resp: 16 13  Temp:    SpO2: 100% 94%    Last Pain:  Vitals:   01/24/19 1330  TempSrc:   PainSc: 4                  Catalina Gravel

## 2019-01-24 NOTE — Anesthesia Preprocedure Evaluation (Addendum)
Anesthesia Evaluation  Patient identified by MRN, date of birth, ID band Patient awake    Reviewed: Allergy & Precautions, NPO status , Patient's Chart, lab work & pertinent test results  History of Anesthesia Complications (+) PONV, Family history of anesthesia reaction and history of anesthetic complications  Airway Mallampati: II  TM Distance: >3 FB Neck ROM: Full    Dental  (+) Teeth Intact, Dental Advisory Given, Chipped   Pulmonary asthma , sleep apnea , former smoker,    Pulmonary exam normal breath sounds clear to auscultation       Cardiovascular negative cardio ROS Normal cardiovascular exam Rhythm:Regular Rate:Normal     Neuro/Psych  Headaches, PSYCHIATRIC DISORDERS Anxiety Depression  Neuromuscular disease    GI/Hepatic Neg liver ROS, GERD  Medicated and Controlled,  Endo/Other  Hypothyroidism   Renal/GU Renal InsufficiencyRenal disease     Musculoskeletal  (+) Arthritis ,   Abdominal   Peds  Hematology negative hematology ROS (+)   Anesthesia Other Findings Day of surgery medications reviewed with the patient.  acquired absence breasts, history therapeutic radiation, history breast cancer  Reproductive/Obstetrics                            Anesthesia Physical Anesthesia Plan  ASA: II  Anesthesia Plan: General   Post-op Pain Management:    Induction: Intravenous  PONV Risk Score and Plan: 4 or greater and Scopolamine patch - Pre-op, Midazolam, Dexamethasone, Ondansetron and Diphenhydramine  Airway Management Planned: LMA  Additional Equipment:   Intra-op Plan:   Post-operative Plan: Extubation in OR  Informed Consent: I have reviewed the patients History and Physical, chart, labs and discussed the procedure including the risks, benefits and alternatives for the proposed anesthesia with the patient or authorized representative who has indicated his/her  understanding and acceptance.     Dental advisory given  Plan Discussed with: CRNA  Anesthesia Plan Comments:       Anesthesia Quick Evaluation

## 2019-01-24 NOTE — Op Note (Signed)
Operative Note   DATE OF OPERATION: 12.1.20  LOCATION: [facility]-outpatient  SURGICAL DIVISION: Plastic Surgery  PREOPERATIVE DIAGNOSES:  1. History right breast cancer 2. Acquired absence bilateral breast 3. History therapeutic radiation 4. History breast reconstruction  POSTOPERATIVE DIAGNOSES:  same  PROCEDURE:  Removal bilateral breast implants  SURGEON: Irene Limbo MD MBA  ASSISTANT: none  ANESTHESIA:  General.   EBL: 50 ml  COMPLICATIONS: None immediate.   INDICATIONS FOR PROCEDURE:  The patient, [name], is a 58 y.o. female born on 17-Sep-1960, is here for removal bilateral breast implants. Patient has a history right breast cancer and underwent implant based reconstruction bilateral, history right latissimus flap and desires removal implants.   FINDINGS: Removed intact smooth silicone implants. Capsules noted to be thin bilateral and anterior capsulectomy completed bilateral to aid with soft tissue adherence.  DESCRIPTION OF PROCEDURE:  The patient's operative site was marked with the patient in the preoperative area. The patient was taken to the operating room. SCDs were placed and IV antibiotics were given. The patient's operative site was prepped and draped in a sterile fashion. A time out was performed and all information was confirmed to be correct.  Elliptical skin excision completed over left chest based at inframammary fold scar. Mastectomy flap excised and implant removed. Anterior capsulectomy performed. Cavity irrigated and hemostasis obtained. Local anesthetic infiltrated. 31 Fr JP placed in subpectoral position and secured to skin with 2-0 nylon. The pectoralis was then secured to chest wall with 0 V lock suture. Closure completed with 3-0 vicryl in dermis and 4-0 monocryl subcuticular skin closure.  I then directed attention to right chest. Elliptical skin excision completed over right chest based at inframammary fold scar. Mastectomy flap  excised. Latissimus muscle divided and implant removed. Anterior capsulectomy performed. Cavity irrigated and hemostasis obtained. Local anesthetic infiltrated. 36 Fr JP placed below latissimus muscle and secured to skin with 2-0 nylon. The pectoralis was then secured to chest wall with 0 V lock suture. Closure completed with 3-0 vicryl in dermis and 4-0 monocryl subcuticular skin closure.  The patient was allowed to wake from anesthesia, extubated and taken to the recovery room in satisfactory condition.   SPECIMENS: right and left mastectomy flap and capsule  DRAINS: 63 Fr JP in right and left chest  Irene Limbo, MD [facility]

## 2019-01-25 ENCOUNTER — Encounter (HOSPITAL_BASED_OUTPATIENT_CLINIC_OR_DEPARTMENT_OTHER): Payer: Self-pay | Admitting: Plastic Surgery

## 2019-01-26 LAB — SURGICAL PATHOLOGY

## 2019-02-07 ENCOUNTER — Other Ambulatory Visit: Payer: Self-pay | Admitting: Hematology

## 2019-02-07 DIAGNOSIS — Z170 Estrogen receptor positive status [ER+]: Secondary | ICD-10-CM

## 2019-02-07 DIAGNOSIS — C50811 Malignant neoplasm of overlapping sites of right female breast: Secondary | ICD-10-CM

## 2019-02-08 ENCOUNTER — Telehealth: Payer: Self-pay

## 2019-02-08 NOTE — Telephone Encounter (Signed)
Patient calls stating she had surgery on 12/1, instead of going back on the Tamoxifen she would like to restart Anastrozole.  Consulted with Dr. Burr Medico and she is okay with patient going back on Anastrozole.  Informed patient of this and also advised that if she has adverse side effects and cannot tolerate she should call our office.  She verbalized an understanding.

## 2019-03-03 ENCOUNTER — Telehealth: Payer: Self-pay | Admitting: Hematology

## 2019-03-03 NOTE — Telephone Encounter (Signed)
Scheduled per lab. Called and spoke with pt, confirmed 3/12 appt

## 2019-03-08 DIAGNOSIS — C50411 Malignant neoplasm of upper-outer quadrant of right female breast: Secondary | ICD-10-CM | POA: Diagnosis not present

## 2019-03-08 DIAGNOSIS — Z9012 Acquired absence of left breast and nipple: Secondary | ICD-10-CM | POA: Diagnosis not present

## 2019-03-15 ENCOUNTER — Telehealth: Payer: Self-pay | Admitting: Hematology

## 2019-03-15 NOTE — Telephone Encounter (Signed)
Returned patient's phone call regarding rescheduling 01/22 appointment, per patient's request appointment has moved to 02/19.

## 2019-03-17 ENCOUNTER — Inpatient Hospital Stay: Payer: BC Managed Care – PPO

## 2019-03-17 ENCOUNTER — Inpatient Hospital Stay: Payer: BC Managed Care – PPO | Admitting: Hematology

## 2019-03-23 ENCOUNTER — Other Ambulatory Visit: Payer: Self-pay | Admitting: Hematology

## 2019-04-12 NOTE — Progress Notes (Signed)
[name]   [phone] [phone]) [phone]   Clinic Follow up Note   Patient Care Team: Adria Dill Leonia Reader, FNP as PCP - General (Internal Medicine) [place], Laurice Record, MD (Inactive) (Orthopedic Surgery) Rutherford Guys, MD (Ophthalmology) Lavonna Monarch, MD (Dermatology) Truitt Merle, MD as Consulting Physician (Hematology) Rolm Bookbinder, MD as Consulting Physician (General Surgery) Kyung Rudd, MD as Consulting Physician (Radiation Oncology) Delice Bison, Charlestine Massed, NP as Nurse Practitioner (Hematology and Oncology)  Date of Service:  04/14/2019  CHIEF COMPLAINT: F/u on right breast cancer  SUMMARY OF ONCOLOGIC HISTORY: Oncology History Overview Note  Cancer Staging Malignant neoplasm of overlapping sites of right breast in female, estrogen receptor positive ([place]) Staging form: Breast, AJCC 8th Edition - Clinical stage from 03/23/2016: Stage IIA (cT3, cN1, cM0, G2, ER: Positive, PR: Positive, HER2: Negative) - Signed by Truitt Merle, MD on 03/31/2016 - Pathologic stage from 05/19/2016: Stage IA (pT2(m), pN1a, cM0, G1, ER: Positive, PR: Positive, HER2: Negative) - Signed by Truitt Merle, MD on 06/18/2016     Malignant neoplasm of overlapping sites of right breast in female, estrogen receptor positive ([place])  03/17/2016 Mammogram   B/l diagnostic mammogram and righ US showed a 3.6cm irregular mass in the right breast 11:00 position, posterior depth, there is a enlarged lymph node in the right axilla is highly suspicious for malignancy. additional 7 mm oval mass in the right breast lower outer quadrant is suspicious for malignancy.   03/23/2016 Initial Biopsy   Right breast 9:30 position biopsy showed invasive ductal carcinoma, grade 1. 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   03/23/2016 Receptors her2   Both breast and node biopsy tumor ER 100% positive, PR 70-95% positive, HER-2 negative, Ki-67 40%   03/23/2016 Initial Diagnosis   Malignant neoplasm of upper-outer quadrant of right breast in female, estrogen receptor positive ([place])   03/25/2016 Initial Biopsy   Right breast 11:00 position core needle biopsy showed invasive duct carcinoma, grade 2.    03/25/2016 Receptors her2   ER 95% positive, PR 90% positive, HER-2 negative, Ki-67 15%   03/25/2016 Miscellaneous   Mammaprint showed low risk type A with index +0.105   03/30/2016 Imaging   Bilateral breast MRI with and without contrast showed a large lobulated enhancing mass within the upper-outer and lower outer right breast with surrounding nodularity, measuring 6.1 x 4.4 x 5.6 cm. Multiple critically sick and right axillary lymph nodes are demonstrated measuring up to 1.5 cm.    04/01/2016 -  Anti-estrogen oral therapy   Exemestane 25 mg daily, plan for 7 years. Switched to Anastrozole 56m in 06/2017 due to joint pain and hot flashes. Due to persistent side effects I changed her to Tamoxifen in 09/2018. She stopped Tamoxifen and switched back to anastrozole in 01/2019 because it was more tolerable.    05/19/2016 Surgery   Bilateral mastectomy and right axillary regional lymph node resection.   05/19/2016 Pathology Results   -Right axillary regional lymph node resection revealed metastatic carcinoma in 2/7 lymph nodes. -Left simple mastectomy revealed lobular neoplasia and fibrocystic changes with adenosis and calcifications. 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 -Skin of the right mastectomy flap was benign. -mpT2, pN1a   06/25/2016 - 08/11/2016 Radiation Therapy   Site/dose:    1. 4 field Right breast was treated to 50.4 Gy in 25 fractions  at 1.8 Gy per fraction. 2. The Right breast was boosted to 10 Gy in 5 fractions at 2 Gy per fraction.   04/15/2017 Genetic  Testing   The patient had genetic testing due to a personal history of breast cancer and family history of breast, ovarian, and colon cancer. The Common Hereditary Cancer Panel was ordered.  The Common Hereditary Cancer Panel offered by [facility] includes sequencing and/or deletion duplication testing of the following 47 genes: APC, ATM, AXIN2, BARD1, BMPR1A, BRCA1, BRCA2, BRIP1, CDH1, CDKN2A (p14ARF), CDKN2A (p16INK4a), CKD4, CHEK2, CTNNA1, DICER1, EPCAM (Deletion/duplication testing only), GREM1 (promoter region deletion/duplication testing only), KIT, MEN1, MLH1, MSH2, MSH3, MSH6, MUTYH, NBN, NF1, NHTL1, PALB2, PDGFRA, PMS2, POLD1, POLE, PTEN, RAD50, RAD51C, RAD51D, SDHB, SDHC, SDHD, SMAD4, SMARCA4. STK11, TP53, TSC1, TSC2, and VHL.  The following genes were evaluated for sequence changes only: SDHA and HOXB13 c.251G>A variant only.  Results: Negative, no pathogenic variants identified. The date of this test report is 04/15/2017   06/28/2017 Surgery   COMPLEX REVISION OF BACK SCAR and LEFT BREAST RECONSTRUCTION WITH SILICONE IMPLANT EXCHANGE AND ACELLULARDERMIS TO LEFT CHEST by Dr. Iran Planas 06/28/17      CURRENT THERAPY:  Exemestane 25 mg daily, plan for 7 years. Switched to Anastrozole 19m in 06/2017 due to joint pain and hot flashes. Due to persistent side effects I changed her to Tamoxifen in 09/2018. She stopped Tamoxifen and switched back to anastrozole in 01/2019 because it was more tolerable.   INTERVAL HISTORY:  Katie STITZERis here for a follow up of right breast cancer. She presents to the clinic alone. She notes she is doing well. She notes she stopped Tamoxifen and is now on anastrozole. She feels she is tolerating joint pain better now and hot flashes have improved. She is on Effexor 1559mand 7547mShe notes she still doe snot get enough sleep at night. She would like to restart Ambien. Otherwise she wakes up every hour or cannot fall asleep.  She notes on 01/24/19 she had breast  implants removed and does not plan to have it replaced. She wears bra prosthesis. She did have seroma of right chest and may need draining in the future or removal of more tissue if patient needed it. She otherwise has recovered from surgery well with axillary pain from LN removal. She notes skin bruising from Celebrex. She has been on this for arthritis back pain. She also notes having migraines which Gabapentin can help.     REVIEW OF SYSTEMS:  Constitutional: Denies fevers, chills or abnormal weight loss (+) Improved hot flashes (+) trouble sleeping Eyes: Denies blurriness of vision Ears, nose, mouth, throat, and face: Denies mucositis or sore throat Respiratory: Denies cough, dyspnea or wheezes Cardiovascular: Denies palpitation, chest discomfort or lower extremity swelling Gastrointestinal:  Denies nausea, heartburn or change in bowel habits Skin: Denies abnormal skin rashes MSK: (+) manageable joint pain (+) arthritis in back  Lymphatics: Denies new lymphadenopathy (+) left forearm bruising improving  Neurological:Denies numbness, tingling or new weaknesses Behavioral/Psych: Mood is stable, no new changes  Breast: (+) right chest seroma  All other systems were reviewed with the patient and are negative.  MEDICAL HISTORY:  Past Medical History:  Diagnosis Date  . Anxiety   . Anxiety disorder   . Asthma    h/o asthma as a child  . Breast cancer ([facility] . Cancer ([facility]   right breast cancer  . Cholelithiasis   . Chronic kidney disease  obstruction of R kidney, ( not a stone) - currently resolved   . Depression   . Diverticulosis   . DJD (degenerative joint disease)    hands & back   . Dyspnea    resolved since she stopped smoking   . Epigastric abdominal pain   . Esophageal stricture   . Family history of adverse reaction to anesthesia    daughter has N&V, takes long time to wake up   . Family history of colon cancer   . Family history of ovarian cancer   .  Family hx of colon cancer   . 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 Ganglion cyst   . GERD (gastroesophageal reflux disease)   . Headache    low grade currently , family history of migraines   . Hemorrhoid   . History of radiation therapy 05/03 - 08/11/2016   1. 4 field Right breast was treated to 50.4 Gy in 25 fractions at 1.8 Gy per fraction. 2. The Right breast was boosted to 10 Gy in 5 fractions at 2 Gy per fraction.  . Hypothyroidism   . Plantar fasciitis, bilateral   . PONV (postoperative nausea and vomiting)    gets anxious with the mask on her face, also remarks that the scop. patch has helped in the past       SURGICAL HISTORY: Past Surgical History:  Procedure Laterality Date  . ABDOMINAL HYSTERECTOMY    . BREAST IMPLANT REMOVAL Bilateral 01/24/2019   Procedure: REMOVAL BREAST IMPLANTS;  Surgeon: Irene Limbo, MD;  Location: [facility];  Service: Plastics;  Laterality: Bilateral;  . BREAST RECONSTRUCTION WITH PLACEMENT OF TISSUE EXPANDER AND FLEX HD (ACELLULAR HYDRATED DERMIS) Bilateral 05/19/2016   Procedure: BREAST RECONSTRUCTION WITH PLACEMENT OF TISSUE EXPANDER AND ALLODERM PLACEMENT;  Surgeon: Irene Limbo, MD;  Location: [facility];  Service: Plastics;  Laterality: Bilateral;  . BREAST RECONSTRUCTION WITH PLACEMENT OF TISSUE EXPANDER AND FLEX HD (ACELLULAR HYDRATED DERMIS) Left 06/28/2017   Procedure: LEFT BREAST RECONSTRUCTION WITH SILICONE IMPLANT EXCHANGE AND ACELLULARDERMIS TO LEFT CHEST;  Surgeon: Irene Limbo, MD;  Location: [place] [facility];  Service: Plastics;  Laterality: Left;  . CAPSULECTOMY Bilateral 01/24/2019   Procedure: CAPSULECTOMY;  Surgeon: Irene Limbo, MD;  Location: [facility];  Service: Plastics;  Laterality: Bilateral;  . CESAREAN SECTION  1988  . CHOLECYSTECTOMY    . HEMORRHOID SURGERY    . LAPAROSCOPIC BILATERAL SALPINGECTOMY N/A 10/26/2012   Procedure: operative  laparoscopy with lysis of adhesions;  Surgeon: Thornell Sartorius, MD;  Location: [facility];  Service: Gynecology;  Laterality: N/A;  . MASTECTOMY Bilateral   . MASTECTOMY WITH RADIOACTIVE SEED GUIDED EXCISION AND AXILLARY SENTINEL LYMPH NODE BIOPSY Bilateral 05/19/2016   Procedure: RIGHT SKIN SPARING MASTECTOMY WITH RIGHT RADIOACTIVE SEED TARGETED DISSECTION AND RIGHT SENTINEL LYMPH NODE BIOPSY, LEFT PROPHYLACTIC SKIN SPARING MASTECTOMY;  Surgeon: Rolm Bookbinder, MD;  Location: [place];  Service: General;  Laterality: Bilateral;  . REMOVAL OF BILATERAL TISSUE EXPANDERS WITH PLACEMENT OF BILATERAL BREAST IMPLANTS Bilateral 01/29/2017   Procedure: REMOVAL OF BILATERAL TISSUE EXPANDERS WITH PLACEMENT OF BILATERAL SILICONE BREAST IMPLANTS, ALLODERM TO LEFT BREAST RECONSTRUCTION;  RIGHT LATISSUMUS FLAP;  Surgeon: Irene Limbo, MD;  Location: [place];  Service: Plastics;  Laterality: Bilateral;  Requesting RNFA  . SCAR REVISION Right 06/28/2017   Procedure: COMPLEX REVISION OF BACK SCAR;  Surgeon: Irene Limbo, MD;  Location: [place];  Service: Plastics;  Laterality: Right;  .  TONSILLECTOMY    . TUBAL LIGATION    . urologic surgery for ureteropelvic junction obstruction      I have reviewed the social history and family history with the patient and they are unchanged from previous note.  ALLERGIES:  is allergic to adhesive [tape] and promethazine hcl.  MEDICATIONS:  Current Outpatient Medications  Medication Sig Dispense Refill  . acetaminophen (TYLENOL) 500 MG tablet Take 1,000 mg by mouth every 6 (six) hours as needed.    . ALPRAZolam (XANAX) 1 MG tablet Take 1 tablet (1 mg total) by mouth 3 (three) times daily. 90 tablet 0  . anastrozole (ARIMIDEX) 1 MG tablet TAKE 1 TABLET BY MOUTH EVERY DAY 90 tablet 1  . celecoxib (CELEBREX) 200 MG capsule Take 1 capsule (200 mg total) by mouth daily. 30 capsule 2  . gabapentin (NEURONTIN) 100 MG capsule TAKE 1 CAPSULE BY  MOUTH AT NIGHT AND INCREASE BY 1 CAP EVERY 1 TO 2 WEEKS UNTIL 4 CAPS NIGHTLY (Patient taking differently: 100 mg at bedtime. Taking one a night) 90 capsule 0  . HYDROcodone-acetaminophen (NORCO/VICODIN) 5-325 MG tablet Take 1 tablet by mouth every 6 (six) hours as needed for moderate pain.    Marland Kitchen levothyroxine (SYNTHROID, LEVOTHROID) 50 MCG tablet Take 50 mcg by mouth daily before breakfast.    . omeprazole (PRILOSEC) 40 MG capsule TAKE ONE CAPSULE BY MOUTH EVERY DAY 30 capsule 4  . venlafaxine XR (EFFEXOR-XR) 150 MG 24 hr capsule TAKE 1 CAPSULE BY MOUTH EVERY DAY WITH BREAKFAST 90 capsule 0  . Cetirizine HCl 10 MG CAPS Take 1 capsule (10 mg total) by mouth daily for 10 days. 10 capsule 0  . venlafaxine XR (EFFEXOR-XR) 75 MG 24 hr capsule TAKE 1 CAPSULE (75 MG TOTAL) BY MOUTH DAILY WITH BREAKFAST. (Patient not taking: Reported on 04/14/2019) 90 capsule 2   No current facility-administered medications for this visit.    PHYSICAL EXAMINATION: ECOG PERFORMANCE STATUS: 0 - Asymptomatic  Vitals:   04/14/19 1502  BP: (!) 147/90  Pulse: 91  Resp: 20  Temp: 98.3 F (36.8 C)  SpO2: 99%   Filed Weights   04/14/19 1502  Weight: 170 lb 8 oz (77.3 kg)    GENERAL:alert, no distress and comfortable SKIN: skin color, texture, turgor are normal, no rashes or significant lesions (+) improved left forearm bruise  EYES: normal, Conjunctiva are pink and non-injected, sclera clear  NECK: supple, thyroid normal size, non-tender, without nodularity LYMPH:  no palpable lymphadenopathy in the cervical, axillary  LUNGS: clear to auscultation and percussion with normal breathing effort HEART: regular rate & rhythm and no murmurs and no lower extremity edema ABDOMEN:abdomen soft, non-tender and normal bowel sounds Musculoskeletal:no cyanosis of digits and no clubbing  NEURO: alert & oriented x 3 with fluent speech, no focal motor/sensory deficits BREAST: S/p b/l mastectomy and reconstruction surgeries: breasts  surgically absent: Surgical incision healed well with soft tissue fullness of right breast. (+) Skin hyperpigmentation of right breast from RT. No palpable mass, nodules or adenopathy bilaterally. Breast exam benign.  LABORATORY DATA:  I have reviewed the data as listed CBC Latest Ref Rng & Units 04/14/2019 10/14/2018 02/17/2018  WBC 4.0 - 10.5 K/uL 6.6 7.4 5.3  Hemoglobin 12.0 - 15.0 g/dL 14.2 13.3 12.5  Hematocrit 36.0 - 46.0 % 44.5 41.1 39.9  Platelets 150 - 400 K/uL 266 248 244     CMP Latest Ref Rng & Units 04/14/2019 10/14/2018 02/17/2018  Glucose 70 - 99 mg/dL 105(H) 116(H) 107(H)  BUN 6 - 20 mg/dL '16 15 14  ' Creatinine 0.44 - 1.00 mg/dL 1.00 1.18(H) 0.89  Sodium 135 - 145 mmol/L 140 141 142  Potassium 3.5 - 5.1 mmol/L 3.7 4.0 4.2  Chloride 98 - 111 mmol/L 102 106 105  CO2 22 - 32 mmol/L '28 22 29  ' Calcium 8.9 - 10.3 mg/dL 9.2 9.0 9.4  Total Protein 6.5 - 8.1 g/dL 7.2 6.9 6.8  Total Bilirubin 0.3 - 1.2 mg/dL 0.4 0.3 0.5  Alkaline Phos 38 - 126 U/L 108 116 116  AST 15 - 41 U/L 42(H) 28 22  ALT 0 - 44 U/L 43 31 25      RADIOGRAPHIC STUDIES: I have personally reviewed the radiological images as listed and agreed with the findings in the report. No results found.   ASSESSMENT & PLAN:  [name] is a 59 y.o. female with    1. Malignant new overlapping sites of right breast, mpT2 pN1a, stage IA, G1, ER positive, PR positive, HER-2 negative, mammaprint low risk luminal type A -Diagnosed in 02/2016. Treated with bilateral mastectomy and reconstruction with implants,and adjuvantradiation.  -Shestartedantiestrogen therapy Exemestane on2/7/18. Due to joint pain and hot flashes Exemestane was switched to Anastrozole in 06/2017. Due to lack of improvement on side effects I switched her to Tamoxifen in 09/2018. She switched back to anastrozole which was more tolerate in 01/2019. Side effects of hot flashes and joint pain more tolerable now, will continue for complete 5-7 years if  tolerable.  -She underwent removal of b/l breast implants by Dr Iran Planas on 01/24/19. She has recovered. She will f/u with Dr Iran Planas as needed.  -From a breast cancer standpoint she is clinically doing well. Lab reviewed, her CBC and CMP are within normal limits. Her physical exam was unremarkable. There is no clinical concern for recurrence. -Continue surveillance. She has a b/l mastectomy and does not need mammogram. -Continue anastrozole -F/u in 6 months with NP Lacie   2. Insomnia -She was previously on Ambien, and attempted to take Benadryl. Her Gabapentin 147m has not helped much and she is not able to tolerate higher dose.  -I advised her to discuss with her PCP and increase physical activity and exercise which can help.   3. Hot flashes -Currently on Effexor 225 mg daily. She cannot tolerate Gabapentin over 1080mnightly.Will continue,It helps some.  Hot flashes has improved.   4. Joint pain, arthritis  -Had worsened on AI, She is currently on Anastrozole which is more tolerable.  -She is on Celebrex for the pain which is mainly in her back but this causes her to bruise significantly. I recommend she speak with PCP about another medication.  -I also recommend she increase physical activity which can help.   5. H/o of smoking,asthma -she hashistory of smoking (1/2 ppd for several years) and asthma for which she use albuterol inhaler as needed -she will follow up with herPCP.  6. Osteopenia  -Her 04/2014 DEXA shows osteopenia with lowest T-score -1.8 at right hip. [facility] pt [facility].  -She is due to DEXA, may repeat with them.   Plan -She is clinically stable -continue anastrozole -request latest DEXA report from her PCP office -Lab and F/u in 6 months with NP Lacie    No problem-specific Assessment & Plan notes found for this encounter.   No orders of the defined types  were placed in this encounter.  All  questions were answered. The patient knows to call the clinic with any problems, questions or concerns. No barriers to learning was detected. The total time spent in the appointment was 25 minutes.     Truitt Merle, MD 04/14/2019   I, Joslyn Devon, am acting as scribe for Truitt Merle, MD.   I have reviewed the above documentation for accuracy and completeness, and I agree with the above.

## 2019-04-14 ENCOUNTER — Encounter: Payer: Self-pay | Admitting: Hematology

## 2019-04-14 ENCOUNTER — Inpatient Hospital Stay: Payer: BC Managed Care – PPO | Attending: Hematology

## 2019-04-14 ENCOUNTER — Inpatient Hospital Stay (HOSPITAL_BASED_OUTPATIENT_CLINIC_OR_DEPARTMENT_OTHER): Payer: BC Managed Care – PPO | Admitting: Hematology

## 2019-04-14 ENCOUNTER — Other Ambulatory Visit: Payer: Self-pay

## 2019-04-14 VITALS — BP 147/90 | HR 91 | Temp 98.3°F | Resp 20 | Ht 66.0 in | Wt 170.5 lb

## 2019-04-14 DIAGNOSIS — Z8041 Family history of malignant neoplasm of ovary: Secondary | ICD-10-CM | POA: Insufficient documentation

## 2019-04-14 DIAGNOSIS — M8580 Other specified disorders of bone density and structure, unspecified site: Secondary | ICD-10-CM | POA: Diagnosis not present

## 2019-04-14 DIAGNOSIS — G4700 Insomnia, unspecified: Secondary | ICD-10-CM | POA: Diagnosis not present

## 2019-04-14 DIAGNOSIS — Z9013 Acquired absence of bilateral breasts and nipples: Secondary | ICD-10-CM | POA: Insufficient documentation

## 2019-04-14 DIAGNOSIS — Z87891 Personal history of nicotine dependence: Secondary | ICD-10-CM | POA: Insufficient documentation

## 2019-04-14 DIAGNOSIS — Z90710 Acquired absence of both cervix and uterus: Secondary | ICD-10-CM | POA: Insufficient documentation

## 2019-04-14 DIAGNOSIS — E039 Hypothyroidism, unspecified: Secondary | ICD-10-CM | POA: Insufficient documentation

## 2019-04-14 DIAGNOSIS — C50811 Malignant neoplasm of overlapping sites of right female breast: Secondary | ICD-10-CM

## 2019-04-14 DIAGNOSIS — F329 Major depressive disorder, single episode, unspecified: Secondary | ICD-10-CM | POA: Diagnosis not present

## 2019-04-14 DIAGNOSIS — Z170 Estrogen receptor positive status [ER+]: Secondary | ICD-10-CM | POA: Insufficient documentation

## 2019-04-14 DIAGNOSIS — Z800 Family history of malignant neoplasm of digestive organs: Secondary | ICD-10-CM | POA: Insufficient documentation

## 2019-04-14 DIAGNOSIS — Z79899 Other long term (current) drug therapy: Secondary | ICD-10-CM | POA: Diagnosis not present

## 2019-04-14 DIAGNOSIS — Z923 Personal history of irradiation: Secondary | ICD-10-CM | POA: Diagnosis not present

## 2019-04-14 DIAGNOSIS — F419 Anxiety disorder, unspecified: Secondary | ICD-10-CM | POA: Insufficient documentation

## 2019-04-14 DIAGNOSIS — Z79811 Long term (current) use of aromatase inhibitors: Secondary | ICD-10-CM | POA: Diagnosis not present

## 2019-04-14 DIAGNOSIS — Z791 Long term (current) use of non-steroidal anti-inflammatories (NSAID): Secondary | ICD-10-CM | POA: Insufficient documentation

## 2019-04-14 DIAGNOSIS — C773 Secondary and unspecified malignant neoplasm of axilla and upper limb lymph nodes: Secondary | ICD-10-CM | POA: Diagnosis not present

## 2019-04-14 LAB — COMPREHENSIVE METABOLIC PANEL
ALT: 43 U/L (ref 0–44)
AST: 42 U/L — ABNORMAL HIGH (ref 15–41)
Albumin: 4.4 g/dL (ref 3.5–5.0)
Alkaline Phosphatase: 108 U/L (ref 38–126)
Anion gap: 10 (ref 5–15)
BUN: 16 mg/dL (ref 6–20)
CO2: 28 mmol/L (ref 22–32)
Calcium: 9.2 mg/dL (ref 8.9–10.3)
Chloride: 102 mmol/L (ref 98–111)
Creatinine, Ser: 1 mg/dL (ref 0.44–1.00)
GFR calc Af Amer: >60 mL/min (ref >60)
GFR calc non Af Amer: >60 mL/min (ref >60)
Glucose, Bld: 105 mg/dL — ABNORMAL HIGH (ref 70–99)
Potassium: 3.7 mmol/L (ref 3.5–5.1)
Sodium: 140 mmol/L (ref 135–145)
Total Bilirubin: 0.4 mg/dL (ref 0.3–1.2)
Total Protein: 7.2 g/dL (ref 6.5–8.1)

## 2019-04-14 LAB — CBC WITH DIFFERENTIAL/PLATELET
Abs Immature Granulocytes: 0.01 10*3/uL (ref 0.00–0.07)
Basophils Absolute: 0.1 10*3/uL (ref 0.0–0.1)
Basophils Relative: 2 %
Eosinophils Absolute: 0.2 10*3/uL (ref 0.0–0.5)
Eosinophils Relative: 3 %
HCT: 44.5 % (ref 36.0–46.0)
Hemoglobin: 14.2 g/dL (ref 12.0–15.0)
Immature Granulocytes: 0 %
Lymphocytes Relative: 36 %
Lymphs Abs: 2.3 10*3/uL (ref 0.7–4.0)
MCH: 28.5 pg (ref 26.0–34.0)
MCHC: 31.9 g/dL (ref 30.0–36.0)
MCV: 89.2 fL (ref 80.0–100.0)
Monocytes Absolute: 0.6 10*3/uL (ref 0.1–1.0)
Monocytes Relative: 9 %
Neutro Abs: 3.4 10*3/uL (ref 1.7–7.7)
Neutrophils Relative %: 50 %
Platelets: 266 10*3/uL (ref 150–400)
RBC: 4.99 MIL/uL (ref 3.87–5.11)
RDW: 13.6 % (ref 11.5–15.5)
WBC: 6.6 10*3/uL (ref 4.0–10.5)
nRBC: 0 % (ref 0.0–0.2)

## 2019-04-16 ENCOUNTER — Other Ambulatory Visit: Payer: Self-pay | Admitting: Hematology

## 2019-04-16 DIAGNOSIS — Z170 Estrogen receptor positive status [ER+]: Secondary | ICD-10-CM

## 2019-04-16 DIAGNOSIS — C50811 Malignant neoplasm of overlapping sites of right female breast: Secondary | ICD-10-CM

## 2019-04-17 ENCOUNTER — Telehealth: Payer: Self-pay | Admitting: Hematology

## 2019-04-17 NOTE — Telephone Encounter (Signed)
Medication refill

## 2019-04-17 NOTE — Telephone Encounter (Signed)
I left a message regarding schedule  

## 2019-04-20 ENCOUNTER — Other Ambulatory Visit: Payer: Self-pay

## 2019-04-20 ENCOUNTER — Other Ambulatory Visit: Payer: Self-pay | Admitting: Hematology

## 2019-04-20 DIAGNOSIS — C50811 Malignant neoplasm of overlapping sites of right female breast: Secondary | ICD-10-CM

## 2019-04-20 DIAGNOSIS — Z170 Estrogen receptor positive status [ER+]: Secondary | ICD-10-CM

## 2019-04-20 MED ORDER — GABAPENTIN 100 MG PO CAPS: 100.0000 mg | ORAL_CAPSULE | Freq: Every day | ORAL | 3 refills | Status: DC

## 2019-04-20 MED ORDER — VENLAFAXINE HCL ER 150 MG PO CP24: ORAL_CAPSULE | ORAL | 0 refills | Status: DC

## 2019-05-05 ENCOUNTER — Other Ambulatory Visit: Payer: BC Managed Care – PPO

## 2019-05-05 ENCOUNTER — Ambulatory Visit: Payer: BC Managed Care – PPO | Admitting: Physician Assistant

## 2019-05-05 ENCOUNTER — Ambulatory Visit: Payer: BC Managed Care – PPO | Admitting: Hematology

## 2019-06-02 ENCOUNTER — Encounter (HOSPITAL_BASED_OUTPATIENT_CLINIC_OR_DEPARTMENT_OTHER): Payer: Self-pay | Admitting: Plastic Surgery

## 2019-06-02 ENCOUNTER — Other Ambulatory Visit: Payer: Self-pay

## 2019-06-02 NOTE — Progress Notes (Signed)

## 2019-06-06 ENCOUNTER — Other Ambulatory Visit (HOSPITAL_COMMUNITY)
Admission: RE | Admit: 2019-06-06 | Discharge: 2019-06-06 | Disposition: A | Discharge status: Home / Self Care | Payer: BC Managed Care – PPO | Source: Ambulatory Visit | Attending: Plastic Surgery | Admitting: Plastic Surgery

## 2019-06-06 DIAGNOSIS — Z01812 Encounter for preprocedural laboratory examination: Secondary | ICD-10-CM | POA: Insufficient documentation

## 2019-06-06 DIAGNOSIS — Z20822 Contact with and (suspected) exposure to covid-19: Secondary | ICD-10-CM | POA: Insufficient documentation

## 2019-06-06 LAB — SARS CORONAVIRUS 2 (TAT 6-24 HRS): SARS Coronavirus 2: NEGATIVE

## 2019-06-09 ENCOUNTER — Ambulatory Visit (HOSPITAL_BASED_OUTPATIENT_CLINIC_OR_DEPARTMENT_OTHER): Payer: BC Managed Care – PPO | Admitting: Certified Registered"

## 2019-06-09 ENCOUNTER — Ambulatory Visit (HOSPITAL_BASED_OUTPATIENT_CLINIC_OR_DEPARTMENT_OTHER)
Admission: RE | Admit: 2019-06-09 | Discharge: 2019-06-09 | Disposition: A | Discharge status: Home / Self Care | Payer: BC Managed Care – PPO | Attending: Plastic Surgery | Admitting: Plastic Surgery

## 2019-06-09 ENCOUNTER — Other Ambulatory Visit: Payer: Self-pay

## 2019-06-09 ENCOUNTER — Encounter (HOSPITAL_BASED_OUTPATIENT_CLINIC_OR_DEPARTMENT_OTHER): Payer: Self-pay | Admitting: Plastic Surgery

## 2019-06-09 ENCOUNTER — Encounter (HOSPITAL_BASED_OUTPATIENT_CLINIC_OR_DEPARTMENT_OTHER)
Admission: RE | Disposition: A | Discharge status: Home / Self Care | Payer: Self-pay | Source: Home / Self Care | Attending: Plastic Surgery

## 2019-06-09 DIAGNOSIS — N6489 Other specified disorders of breast: Secondary | ICD-10-CM | POA: Diagnosis not present

## 2019-06-09 DIAGNOSIS — Z853 Personal history of malignant neoplasm of breast: Secondary | ICD-10-CM | POA: Diagnosis not present

## 2019-06-09 DIAGNOSIS — Y838 Other surgical procedures as the cause of abnormal reaction of the patient, or of later complication, without mention of misadventure at the time of the procedure: Secondary | ICD-10-CM | POA: Diagnosis not present

## 2019-06-09 DIAGNOSIS — L905 Scar conditions and fibrosis of skin: Secondary | ICD-10-CM | POA: Diagnosis not present

## 2019-06-09 DIAGNOSIS — E039 Hypothyroidism, unspecified: Secondary | ICD-10-CM | POA: Diagnosis not present

## 2019-06-09 DIAGNOSIS — L987 Excessive and redundant skin and subcutaneous tissue: Secondary | ICD-10-CM | POA: Insufficient documentation

## 2019-06-09 DIAGNOSIS — Z87891 Personal history of nicotine dependence: Secondary | ICD-10-CM | POA: Diagnosis not present

## 2019-06-09 DIAGNOSIS — Z923 Personal history of irradiation: Secondary | ICD-10-CM | POA: Diagnosis not present

## 2019-06-09 DIAGNOSIS — L7682 Other postprocedural complications of skin and subcutaneous tissue: Secondary | ICD-10-CM | POA: Insufficient documentation

## 2019-06-09 DIAGNOSIS — Z9013 Acquired absence of bilateral breasts and nipples: Secondary | ICD-10-CM | POA: Insufficient documentation

## 2019-06-09 DIAGNOSIS — N641 Fat necrosis of breast: Secondary | ICD-10-CM | POA: Diagnosis not present

## 2019-06-09 HISTORY — PX: LESION EXCISION WITH COMPLEX REPAIR: SHX6700

## 2019-06-09 SURGERY — LESION EXCISION WITH COMPLEX REPAIR
Anesthesia: General | Site: Chest | Laterality: Right

## 2019-06-09 MED ORDER — CELECOXIB 200 MG PO CAPS
ORAL_CAPSULE | ORAL | Status: AC
  Filled 2019-06-09: qty 1

## 2019-06-09 MED ORDER — FENTANYL CITRATE (PF) 250 MCG/5ML IJ SOLN
INTRAMUSCULAR | Status: DC | PRN
  Administered 2019-06-09 (×2): 25 ug via INTRAVENOUS
  Administered 2019-06-09: 50 ug via INTRAVENOUS

## 2019-06-09 MED ORDER — CEFAZOLIN SODIUM-DEXTROSE 2-4 GM/100ML-% IV SOLN
INTRAVENOUS | Status: AC
  Filled 2019-06-09: qty 100

## 2019-06-09 MED ORDER — ONDANSETRON HCL 4 MG/2ML IJ SOLN
INTRAMUSCULAR | Status: AC
  Filled 2019-06-09: qty 2

## 2019-06-09 MED ORDER — GABAPENTIN 300 MG PO CAPS
300.0000 mg | ORAL_CAPSULE | ORAL | Status: AC
  Administered 2019-06-09: 300 mg via ORAL

## 2019-06-09 MED ORDER — DEXAMETHASONE SODIUM PHOSPHATE 10 MG/ML IJ SOLN
INTRAMUSCULAR | Status: AC
  Filled 2019-06-09: qty 1

## 2019-06-09 MED ORDER — MIDAZOLAM HCL 2 MG/2ML IJ SOLN: 1.0000 mg | INTRAMUSCULAR | Status: DC | PRN

## 2019-06-09 MED ORDER — MIDAZOLAM HCL 2 MG/2ML IJ SOLN
INTRAMUSCULAR | Status: AC
  Filled 2019-06-09: qty 2

## 2019-06-09 MED ORDER — MIDAZOLAM HCL 5 MG/5ML IJ SOLN
INTRAMUSCULAR | Status: DC | PRN
  Administered 2019-06-09: 2 mg via INTRAVENOUS

## 2019-06-09 MED ORDER — FENTANYL CITRATE (PF) 100 MCG/2ML IJ SOLN
INTRAMUSCULAR | Status: AC
  Filled 2019-06-09: qty 2

## 2019-06-09 MED ORDER — ACETAMINOPHEN 500 MG PO TABS
ORAL_TABLET | ORAL | Status: AC
  Filled 2019-06-09: qty 2

## 2019-06-09 MED ORDER — CELECOXIB 200 MG PO CAPS
200.0000 mg | ORAL_CAPSULE | ORAL | Status: AC
  Administered 2019-06-09: 10:00:00 200 mg via ORAL

## 2019-06-09 MED ORDER — OXYCODONE HCL 5 MG PO TABS
ORAL_TABLET | ORAL | Status: AC
  Filled 2019-06-09: qty 1

## 2019-06-09 MED ORDER — ONDANSETRON HCL 4 MG/2ML IJ SOLN
INTRAMUSCULAR | Status: DC | PRN
  Administered 2019-06-09: 4 mg via INTRAVENOUS

## 2019-06-09 MED ORDER — FENTANYL CITRATE (PF) 100 MCG/2ML IJ SOLN
25.0000 ug | INTRAMUSCULAR | Status: DC | PRN
  Administered 2019-06-09 (×2): 50 ug via INTRAVENOUS

## 2019-06-09 MED ORDER — PROPOFOL 10 MG/ML IV BOLUS
INTRAVENOUS | Status: AC
  Filled 2019-06-09: qty 20

## 2019-06-09 MED ORDER — DEXAMETHASONE SODIUM PHOSPHATE 10 MG/ML IJ SOLN
INTRAMUSCULAR | Status: DC | PRN
  Administered 2019-06-09: 4 mg via INTRAVENOUS

## 2019-06-09 MED ORDER — CEFAZOLIN SODIUM-DEXTROSE 2-4 GM/100ML-% IV SOLN
2.0000 g | INTRAVENOUS | Status: AC
  Administered 2019-06-09: 2 g via INTRAVENOUS

## 2019-06-09 MED ORDER — BUPIVACAINE HCL (PF) 0.25 % IJ SOLN
INTRAMUSCULAR | Status: DC | PRN
  Administered 2019-06-09: 20 mL

## 2019-06-09 MED ORDER — METHOCARBAMOL 500 MG PO TABS: 500.0000 mg | ORAL_TABLET | Freq: Three times a day (TID) | ORAL | 0 refills | Status: DC | PRN

## 2019-06-09 MED ORDER — CHLORHEXIDINE GLUCONATE CLOTH 2 % EX PADS: 6.0000 | MEDICATED_PAD | Freq: Once | CUTANEOUS | Status: DC

## 2019-06-09 MED ORDER — OXYCODONE HCL 5 MG PO TABS: 5.0000 mg | ORAL_TABLET | Freq: Four times a day (QID) | ORAL | 0 refills | Status: AC | PRN

## 2019-06-09 MED ORDER — ACETAMINOPHEN 500 MG PO TABS
1000.0000 mg | ORAL_TABLET | ORAL | Status: AC
  Administered 2019-06-09: 10:00:00 1000 mg via ORAL

## 2019-06-09 MED ORDER — LIDOCAINE 2% (20 MG/ML) 5 ML SYRINGE
INTRAMUSCULAR | Status: DC | PRN
  Administered 2019-06-09: 100 mg via INTRAVENOUS

## 2019-06-09 MED ORDER — LACTATED RINGERS IV SOLN: INTRAVENOUS | Status: DC

## 2019-06-09 MED ORDER — SCOPOLAMINE 1 MG/3DAYS TD PT72
MEDICATED_PATCH | TRANSDERMAL | Status: AC
  Filled 2019-06-09: qty 1

## 2019-06-09 MED ORDER — FENTANYL CITRATE (PF) 100 MCG/2ML IJ SOLN: 50.0000 ug | INTRAMUSCULAR | Status: DC | PRN

## 2019-06-09 MED ORDER — OXYCODONE HCL 5 MG/5ML PO SOLN: 5.0000 mg | Freq: Once | ORAL | Status: AC | PRN

## 2019-06-09 MED ORDER — GABAPENTIN 300 MG PO CAPS
ORAL_CAPSULE | ORAL | Status: AC
  Filled 2019-06-09: qty 1

## 2019-06-09 MED ORDER — PROPOFOL 10 MG/ML IV BOLUS
INTRAVENOUS | Status: DC | PRN
  Administered 2019-06-09: 200 mg via INTRAVENOUS

## 2019-06-09 MED ORDER — OXYCODONE HCL 5 MG PO TABS
5.0000 mg | ORAL_TABLET | Freq: Once | ORAL | Status: AC | PRN
  Administered 2019-06-09: 14:00:00 5 mg via ORAL

## 2019-06-09 MED ORDER — KETOROLAC TROMETHAMINE 30 MG/ML IJ SOLN: 30.0000 mg | Freq: Once | INTRAMUSCULAR | Status: DC | PRN

## 2019-06-09 SURGICAL SUPPLY — 56 items
ADH SKN CLS APL DERMABOND .7 (GAUZE/BANDAGES/DRESSINGS)
BALL CTTN LRG ABS STRL LF (GAUZE/BANDAGES/DRESSINGS)
BLADE CLIPPER SURG (BLADE) IMPLANT
BLADE SURG 15 STRL LF DISP TIS (BLADE) ×1 IMPLANT
BLADE SURG 15 STRL SS (BLADE) ×2
COTTONBALL LRG STERILE PKG (GAUZE/BANDAGES/DRESSINGS) IMPLANT
COVER BACK TABLE 60X90IN (DRAPES) IMPLANT
COVER MAYO STAND STRL (DRAPES) ×2 IMPLANT
COVER WAND RF STERILE (DRAPES) IMPLANT
DECANTER SPIKE VIAL GLASS SM (MISCELLANEOUS) IMPLANT
DERMABOND ADVANCED (GAUZE/BANDAGES/DRESSINGS)
DERMABOND ADVANCED .7 DNX12 (GAUZE/BANDAGES/DRESSINGS) IMPLANT
DRAPE TOP SHEET (DRAPES) ×2 IMPLANT
DRAPE U-SHAPE 76X120 STRL (DRAPES) ×2 IMPLANT
DRAPE UTILITY XL STRL (DRAPES) ×2 IMPLANT
ELECT COATED BLADE 2.86 ST (ELECTRODE) ×4 IMPLANT
ELECT NDL BLADE 2-5/6 (NEEDLE) ×1 IMPLANT
ELECT NEEDLE BLADE 2-5/6 (NEEDLE) IMPLANT
ELECT REM PT RETURN 9FT ADLT (ELECTROSURGICAL) ×2
ELECT REM PT RETURN 9FT PED (ELECTROSURGICAL)
ELECTRODE REM PT RETRN 9FT PED (ELECTROSURGICAL) IMPLANT
ELECTRODE REM PT RTRN 9FT ADLT (ELECTROSURGICAL) ×1 IMPLANT
GAUZE XEROFORM 1X8 LF (GAUZE/BANDAGES/DRESSINGS) IMPLANT
GAUZE XEROFORM 5X9 LF (GAUZE/BANDAGES/DRESSINGS) IMPLANT
GLOVE BIO SURGEON STRL SZ 6 (GLOVE) ×4 IMPLANT
GOWN STRL REUS W/ TWL LRG LVL3 (GOWN DISPOSABLE) ×3 IMPLANT
GOWN STRL REUS W/TWL LRG LVL3 (GOWN DISPOSABLE) ×6
NEEDLE HYPO 30GX1 BEV (NEEDLE) IMPLANT
NEEDLE PRECISIONGLIDE 27X1.5 (NEEDLE) ×2 IMPLANT
PACK BASIN DAY SURGERY FS (CUSTOM PROCEDURE TRAY) ×2 IMPLANT
PENCIL SMOKE EVACUATOR (MISCELLANEOUS) ×2 IMPLANT
SHEET MEDIUM DRAPE 40X70 STRL (DRAPES) ×4 IMPLANT
SLEEVE SCD COMPRESS KNEE MED (MISCELLANEOUS) ×2 IMPLANT
SPONGE GAUZE 2X2 8PLY STRL LF (GAUZE/BANDAGES/DRESSINGS) IMPLANT
SPONGE LAP 18X18 RF (DISPOSABLE) ×4 IMPLANT
STRIP CLOSURE SKIN 1/2X4 (GAUZE/BANDAGES/DRESSINGS) IMPLANT
STRIP CLOSURE SKIN 1/4X4 (GAUZE/BANDAGES/DRESSINGS) IMPLANT
SUT MNCRL AB 3-0 PS2 18 (SUTURE) ×2 IMPLANT
SUT MNCRL AB 4-0 PS2 18 (SUTURE) ×4 IMPLANT
SUT PLAIN 5 0 P 3 18 (SUTURE) IMPLANT
SUT PROLENE 5 0 P 3 (SUTURE) IMPLANT
SUT PROLENE 5 0 PS 2 (SUTURE) IMPLANT
SUT PROLENE 6 0 P 1 18 (SUTURE) IMPLANT
SUT VIC AB 3-0 PS1 18 (SUTURE) ×2
SUT VIC AB 3-0 PS1 18XBRD (SUTURE) ×1 IMPLANT
SUT VIC AB 3-0 SH 27 (SUTURE)
SUT VIC AB 3-0 SH 27X BRD (SUTURE) IMPLANT
SUT VICRYL 4-0 PS2 18IN ABS (SUTURE) ×2 IMPLANT
SWABSTICK POVIDONE IODINE SNGL (MISCELLANEOUS) IMPLANT
SYR BULB 3OZ (MISCELLANEOUS) IMPLANT
SYR BULB IRRIGATION 50ML (SYRINGE) ×2 IMPLANT
SYR CONTROL 10ML LL (SYRINGE) ×2 IMPLANT
TOWEL GREEN STERILE FF (TOWEL DISPOSABLE) ×2 IMPLANT
TRAY DSU PREP LF (CUSTOM PROCEDURE TRAY) IMPLANT
TUBE CONNECTING 20X1/4 (TUBING) ×2 IMPLANT
YANKAUER SUCT BULB TIP NO VENT (SUCTIONS) ×2 IMPLANT

## 2019-06-09 NOTE — Anesthesia Postprocedure Evaluation (Signed)
Anesthesia Post Note  Patient: [name]  Procedure(s) Performed: COMPLEX REPAIR RIGHT CHEST 15CM (Right Chest)     Patient location during evaluation: PACU Anesthesia Type: General Level of consciousness: awake and alert Pain management: pain level controlled Vital Signs Assessment: post-procedure vital signs reviewed and stable Respiratory status: spontaneous breathing, nonlabored ventilation, respiratory function stable and patient connected to nasal cannula oxygen Cardiovascular status: blood pressure returned to baseline and stable Postop Assessment: no apparent nausea or vomiting Anesthetic complications: no    Last Vitals:  Vitals:   06/09/19 1300 06/09/19 1315  BP: 128/87 136/81  Pulse: 87 87  Resp: 14 16  Temp:    SpO2: 100% 100%    Last Pain:  Vitals:   06/09/19 1243  TempSrc:   PainSc: 8                  Natanya Holecek S

## 2019-06-09 NOTE — Op Note (Signed)
Operative Note   DATE OF OPERATION: 4.16.21  LOCATION: [facility]  SURGICAL DIVISION: Plastic Surgery  PREOPERATIVE DIAGNOSES:  1, Acquired absence breasts 2. History breast cancer 3. History therapeutic radation  POSTOPERATIVE DIAGNOSES:  same  PROCEDURE:  Complex repair chest 28 cm  SURGEON: Irene Limbo MD MBA  ASSISTANT: none  ANESTHESIA:  General.   EBL: < 50 ml  COMPLICATIONS: None immediate.   INDICATIONS FOR PROCEDURE:  The patient, [name], is a 59 y.o. female born on [birth year], is here for excision redundant soft tissue mastectomy flaps following bilateral mastectomies. She underwent implant based reconstruction and removed these in past.   FINDINGS: Redundant soft tissue right chest and left medial chest excised.  DESCRIPTION OF PROCEDURE:  The patient's operative site was marked with the patient in the preoperative area in standing position to mark areas desired excision. The patient was taken to the operating room. SCDs were placed and IV antibiotics were given. The patient's operative site was prepped and draped in a sterile fashion. A time out was performed and all information was confirmed to be correct. Local anesthetic infiltrated bilateral. Sharp excision left chest marked area soft tissue completed. Over right chest larger area encompassing entire hemi chest and prior scar excised. Limited elevation skin flap inferiorly completed with aid with closure. Wound irrigated. Hemostasis obtained. Closure completed throughout with interrupted and short running 3-0 vicryl and 3-0 monocryl in dermis. Skin closure completed with 4-0 monocryl subcuticular. Dermabond applied followed by dry dressing.   The patient was allowed to wake from anesthesia, extubated and taken to the recovery room in satisfactory condition.   SPECIMENS: right mastectomy flap  DRAINS: none

## 2019-06-09 NOTE — Discharge Instructions (Signed)
  TOOK TYLENOL @10 ;20 am. No tylenol products till after 4:20 pm today!   Post Anesthesia Home Care Instructions  Activity: Get plenty of rest for the remainder of the day. A responsible individual must stay with you for 24 hours following the procedure.  For the next 24 hours, DO NOT: -Drive a car -Paediatric nurse -Drink alcoholic beverages -Take any medication unless instructed by your physician -Make any legal decisions or sign important papers.  Meals: Start with liquid foods such as gelatin or soup. Progress to regular foods as tolerated. Avoid greasy, spicy, heavy foods. If nausea and/or vomiting occur, drink only clear liquids until the nausea and/or vomiting subsides. Call your physician if vomiting continues.  Special Instructions/Symptoms: Your throat may feel dry or sore from the anesthesia or the breathing tube placed in your throat during surgery. If this causes discomfort, gargle with warm salt water. The discomfort should disappear within 24 hours.  If you had a scopolamine patch placed behind your ear for the management of post- operative nausea and/or vomiting:  1. The medication in the patch is effective for 72 hours, after which it should be removed.  Wrap patch in a tissue and discard in the trash. Wash hands thoroughly with soap and water. 2. You may remove the patch earlier than 72 hours if you experience unpleasant side effects which may include dry mouth, dizziness or visual disturbances. 3. Avoid touching the patch. Wash your hands with soap and water after contact with the patch.    Call your surgeon if you experience:   1.  Fever over 101.0. 2.  Inability to urinate. 3.  Nausea and/or vomiting. 4.  Extreme swelling or bruising at the surgical site. 5.  Continued bleeding from the incision. 6.  Increased pain, redness or drainage from the incision. 7.  Problems related to your pain medication. 8.  Any problems and/or concerns

## 2019-06-09 NOTE — H&P (Addendum)
Subjective:   Patient ID: [name] is a 59 y.o. female.  4.5 months post op removal implants, excision mastectomy flap tissue. Presents today for excision redundant soft tissue over right chest.  Presented with palpable right breast lump with associated pain. Diagnostic MMG and right US showed a 3.6 cm irregular mass in the right breast 11:00 position, with an enlarged lymph node in the right axilla. Additional 7 mm oval mass in the right breast LOQ present and suspicious for malignancy.  Biopsy of right breast at 9:30 o'clock position showed invasive mammary carcinoma. Biopsy of the right breast at the 11:00 o'clock position showed fibrocystic change and dense fibrosis with no malignancy identified. Right lymph node was positive for metastatic mammary carcinoma. Repeat biopsy of the right breast at the 11:00 o'clock position showed invasive mammary carcinoma. All ER/PR+, Her2-.  MRI howed large irregular masslike area of enhancement within the upper outer and lower outer right breast compatible with biopsy-proven malignancy with enlarged right axillary lymph nodes compatible with metastatic adenopathy.  Mammaprint low risk.  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 Completed adjuvant radiation 6.19.18. On anastrazole.  Prior C or D. Right mastectomy 731 left 713 g  Former smoker- quit prior to surgery.  Objective:  Physical Exam CV: normal heart sounds PULM: clear to auscultation Chest:scars maturing no seromas, right chest with redundant tissue that overhangs scar. Left with some fullness medial extent IMF scar.   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 S/p revision scar left chest, back S/p removal bilateral breast implants, excision mastectomy flap skin  Plan:   Plan excision additional soft tissue for flatter contour right and left chest. Reviewed post op restrictions including no swimming until healed. Do not anticipate she will need any time off work. Offered in office under local vs in OR- patient prefers latter. Reviewed OP surgery, no drains.

## 2019-06-09 NOTE — Anesthesia Procedure Notes (Signed)
Procedure Name: LMA Insertion Date/Time: 06/09/2019 11:27 AM Performed by: Myna Bright, CRNA Pre-anesthesia Checklist: Patient identified, Emergency Drugs available, Suction available and Patient being monitored Patient Re-evaluated:Patient Re-evaluated prior to induction Oxygen Delivery Method: Circle system utilized Preoxygenation: Pre-oxygenation with 100% oxygen Induction Type: IV induction Ventilation: Mask ventilation without difficulty LMA: LMA inserted LMA Size: 4.0 Tube type: Oral Number of attempts: 1 Placement Confirmation: positive ETCO2 and breath sounds checked- equal and bilateral Tube secured with: Tape Dental Injury: Teeth and Oropharynx as per pre-operative assessment

## 2019-06-09 NOTE — Anesthesia Preprocedure Evaluation (Signed)
Anesthesia Evaluation  Patient identified by MRN, date of birth, ID band Patient awake    Reviewed: Allergy & Precautions, NPO status , Patient's Chart, lab work & pertinent test results  History of Anesthesia Complications (+) PONV  Airway Mallampati: II  TM Distance: >3 FB Neck ROM: Full    Dental no notable dental hx.    Pulmonary neg pulmonary ROS, former smoker,    Pulmonary exam normal breath sounds clear to auscultation       Cardiovascular negative cardio ROS Normal cardiovascular exam Rhythm:Regular Rate:Normal     Neuro/Psych negative neurological ROS  negative psych ROS   GI/Hepatic Neg liver ROS, GERD  Medicated,  Endo/Other  Hypothyroidism   Renal/GU negative Renal ROS  negative genitourinary   Musculoskeletal negative musculoskeletal ROS (+)   Abdominal   Peds negative pediatric ROS (+)  Hematology negative hematology ROS (+)   Anesthesia Other Findings   Reproductive/Obstetrics negative OB ROS                             Anesthesia Physical Anesthesia Plan  ASA: II  Anesthesia Plan: General   Post-op Pain Management:    Induction: Intravenous  PONV Risk Score and Plan: 4 or greater and Ondansetron, Dexamethasone, Midazolam, Scopolamine patch - Pre-op and Treatment may vary due to age or medical condition  Airway Management Planned: LMA  Additional Equipment:   Intra-op Plan:   Post-operative Plan: Extubation in OR  Informed Consent: I have reviewed the patients History and Physical, chart, labs and discussed the procedure including the risks, benefits and alternatives for the proposed anesthesia with the patient or authorized representative who has indicated his/her understanding and acceptance.     Dental advisory given  Plan Discussed with: CRNA and Surgeon  Anesthesia Plan Comments:         Anesthesia Quick Evaluation

## 2019-06-09 NOTE — Transfer of Care (Signed)
Immediate Anesthesia Transfer of Care Note  Patient: [name]  Procedure(s) Performed: COMPLEX REPAIR RIGHT CHEST 15CM (Right Chest)  Patient Location: PACU  Anesthesia Type:General  Level of Consciousness: awake, alert , oriented and patient cooperative  Airway & Oxygen Therapy: Patient Spontanous Breathing and Patient connected to face mask oxygen  Post-op Assessment: Report given to RN, Post -op Vital signs reviewed and stable and Patient moving all extremities  Post vital signs: Reviewed and stable  Last Vitals:  Vitals Value Taken Time  BP 136/72 06/09/19 1239  Temp    Pulse 105 06/09/19 1241  Resp 18 06/09/19 1241  SpO2 97 % 06/09/19 1241  Vitals shown include unvalidated device data.  Last Pain:  Vitals:   06/09/19 0952  TempSrc: Oral  PainSc: 0-No pain      Patients Stated Pain Goal: 3 ([phone] [phone])  Complications: No apparent anesthesia complications

## 2019-06-12 LAB — SURGICAL PATHOLOGY

## 2019-06-13 ENCOUNTER — Encounter: Payer: Self-pay | Admitting: *Deleted

## 2019-06-21 DIAGNOSIS — Z0000 Encounter for general adult medical examination without abnormal findings: Secondary | ICD-10-CM | POA: Diagnosis not present

## 2019-06-21 DIAGNOSIS — E039 Hypothyroidism, unspecified: Secondary | ICD-10-CM | POA: Diagnosis not present

## 2019-06-21 DIAGNOSIS — Z13220 Encounter for screening for lipoid disorders: Secondary | ICD-10-CM | POA: Diagnosis not present

## 2019-06-21 DIAGNOSIS — M8580 Other specified disorders of bone density and structure, unspecified site: Secondary | ICD-10-CM | POA: Diagnosis not present

## 2019-06-21 DIAGNOSIS — E559 Vitamin D deficiency, unspecified: Secondary | ICD-10-CM | POA: Diagnosis not present

## 2019-06-26 DIAGNOSIS — Z853 Personal history of malignant neoplasm of breast: Secondary | ICD-10-CM | POA: Diagnosis not present

## 2019-06-26 DIAGNOSIS — Z923 Personal history of irradiation: Secondary | ICD-10-CM | POA: Diagnosis not present

## 2019-06-26 DIAGNOSIS — Z9013 Acquired absence of bilateral breasts and nipples: Secondary | ICD-10-CM | POA: Diagnosis not present

## 2019-06-26 DIAGNOSIS — Z0000 Encounter for general adult medical examination without abnormal findings: Secondary | ICD-10-CM | POA: Diagnosis not present

## 2019-06-27 ENCOUNTER — Other Ambulatory Visit: Payer: Self-pay | Admitting: Internal Medicine

## 2019-06-27 DIAGNOSIS — E041 Nontoxic single thyroid nodule: Secondary | ICD-10-CM

## 2019-07-06 DIAGNOSIS — Z853 Personal history of malignant neoplasm of breast: Secondary | ICD-10-CM | POA: Diagnosis not present

## 2019-07-06 DIAGNOSIS — Z923 Personal history of irradiation: Secondary | ICD-10-CM | POA: Diagnosis not present

## 2019-07-06 DIAGNOSIS — Z9013 Acquired absence of bilateral breasts and nipples: Secondary | ICD-10-CM | POA: Diagnosis not present

## 2019-07-12 ENCOUNTER — Other Ambulatory Visit: Payer: BC Managed Care – PPO

## 2019-07-20 ENCOUNTER — Other Ambulatory Visit: Payer: BC Managed Care – PPO

## 2019-07-23 ENCOUNTER — Other Ambulatory Visit: Payer: Self-pay | Admitting: Hematology

## 2019-07-23 DIAGNOSIS — Z170 Estrogen receptor positive status [ER+]: Secondary | ICD-10-CM

## 2019-07-23 DIAGNOSIS — C50811 Malignant neoplasm of overlapping sites of right female breast: Secondary | ICD-10-CM

## 2019-08-23 ENCOUNTER — Ambulatory Visit
Admission: RE | Admit: 2019-08-23 | Discharge: 2019-08-23 | Disposition: A | Discharge status: Home / Self Care | Payer: BC Managed Care – PPO | Source: Ambulatory Visit | Attending: Internal Medicine | Admitting: Internal Medicine

## 2019-08-23 DIAGNOSIS — E041 Nontoxic single thyroid nodule: Secondary | ICD-10-CM | POA: Diagnosis not present

## 2019-09-01 ENCOUNTER — Other Ambulatory Visit: Payer: Self-pay | Admitting: Hematology

## 2019-09-01 DIAGNOSIS — Z170 Estrogen receptor positive status [ER+]: Secondary | ICD-10-CM

## 2019-09-01 DIAGNOSIS — C50811 Malignant neoplasm of overlapping sites of right female breast: Secondary | ICD-10-CM

## 2019-09-11 ENCOUNTER — Other Ambulatory Visit: Payer: Self-pay | Admitting: Hematology

## 2019-10-02 DIAGNOSIS — Z9013 Acquired absence of bilateral breasts and nipples: Secondary | ICD-10-CM | POA: Diagnosis not present

## 2019-10-02 DIAGNOSIS — Z853 Personal history of malignant neoplasm of breast: Secondary | ICD-10-CM | POA: Diagnosis not present

## 2019-10-02 DIAGNOSIS — Z923 Personal history of irradiation: Secondary | ICD-10-CM | POA: Diagnosis not present

## 2019-10-12 ENCOUNTER — Inpatient Hospital Stay: Payer: BC Managed Care – PPO | Admitting: Nurse Practitioner

## 2019-10-12 ENCOUNTER — Encounter: Payer: Self-pay | Admitting: Nurse Practitioner

## 2019-10-12 ENCOUNTER — Other Ambulatory Visit: Payer: Self-pay

## 2019-10-12 ENCOUNTER — Inpatient Hospital Stay: Payer: BC Managed Care – PPO | Attending: Nurse Practitioner

## 2019-10-12 VITALS — BP 131/80 | HR 89 | Temp 97.8°F | Resp 17 | Ht 66.0 in | Wt 168.1 lb

## 2019-10-12 DIAGNOSIS — Z800 Family history of malignant neoplasm of digestive organs: Secondary | ICD-10-CM | POA: Diagnosis not present

## 2019-10-12 DIAGNOSIS — Z170 Estrogen receptor positive status [ER+]: Secondary | ICD-10-CM

## 2019-10-12 DIAGNOSIS — Z90710 Acquired absence of both cervix and uterus: Secondary | ICD-10-CM | POA: Insufficient documentation

## 2019-10-12 DIAGNOSIS — F419 Anxiety disorder, unspecified: Secondary | ICD-10-CM | POA: Diagnosis not present

## 2019-10-12 DIAGNOSIS — N189 Chronic kidney disease, unspecified: Secondary | ICD-10-CM | POA: Diagnosis not present

## 2019-10-12 DIAGNOSIS — C773 Secondary and unspecified malignant neoplasm of axilla and upper limb lymph nodes: Secondary | ICD-10-CM | POA: Diagnosis not present

## 2019-10-12 DIAGNOSIS — K219 Gastro-esophageal reflux disease without esophagitis: Secondary | ICD-10-CM | POA: Insufficient documentation

## 2019-10-12 DIAGNOSIS — J45909 Unspecified asthma, uncomplicated: Secondary | ICD-10-CM | POA: Insufficient documentation

## 2019-10-12 DIAGNOSIS — Z9079 Acquired absence of other genital organ(s): Secondary | ICD-10-CM | POA: Insufficient documentation

## 2019-10-12 DIAGNOSIS — Z87891 Personal history of nicotine dependence: Secondary | ICD-10-CM | POA: Insufficient documentation

## 2019-10-12 DIAGNOSIS — Z79899 Other long term (current) drug therapy: Secondary | ICD-10-CM | POA: Diagnosis not present

## 2019-10-12 DIAGNOSIS — C50811 Malignant neoplasm of overlapping sites of right female breast: Secondary | ICD-10-CM

## 2019-10-12 DIAGNOSIS — Z90722 Acquired absence of ovaries, bilateral: Secondary | ICD-10-CM | POA: Insufficient documentation

## 2019-10-12 DIAGNOSIS — F329 Major depressive disorder, single episode, unspecified: Secondary | ICD-10-CM | POA: Insufficient documentation

## 2019-10-12 DIAGNOSIS — Z79811 Long term (current) use of aromatase inhibitors: Secondary | ICD-10-CM | POA: Diagnosis not present

## 2019-10-12 DIAGNOSIS — M85851 Other specified disorders of bone density and structure, right thigh: Secondary | ICD-10-CM | POA: Diagnosis not present

## 2019-10-12 DIAGNOSIS — Z8041 Family history of malignant neoplasm of ovary: Secondary | ICD-10-CM | POA: Diagnosis not present

## 2019-10-12 DIAGNOSIS — E039 Hypothyroidism, unspecified: Secondary | ICD-10-CM | POA: Insufficient documentation

## 2019-10-12 LAB — COMPREHENSIVE METABOLIC PANEL
ALT: 23 U/L (ref 0–44)
AST: 28 U/L (ref 15–41)
Albumin: 4.2 g/dL (ref 3.5–5.0)
Alkaline Phosphatase: 110 U/L (ref 38–126)
Anion gap: 12 (ref 5–15)
BUN: 15 mg/dL (ref 6–20)
CO2: 24 mmol/L (ref 22–32)
Calcium: 9.8 mg/dL (ref 8.9–10.3)
Chloride: 104 mmol/L (ref 98–111)
Creatinine, Ser: 0.93 mg/dL (ref 0.44–1.00)
GFR calc Af Amer: >60 mL/min (ref >60)
GFR calc non Af Amer: >60 mL/min (ref >60)
Glucose, Bld: 96 mg/dL (ref 70–99)
Potassium: 3.8 mmol/L (ref 3.5–5.1)
Sodium: 140 mmol/L (ref 135–145)
Total Bilirubin: 0.5 mg/dL (ref 0.3–1.2)
Total Protein: 7 g/dL (ref 6.5–8.1)

## 2019-10-12 LAB — CBC WITH DIFFERENTIAL/PLATELET
Abs Immature Granulocytes: 0.01 10*3/uL (ref 0.00–0.07)
Basophils Absolute: 0.1 10*3/uL (ref 0.0–0.1)
Basophils Relative: 1 %
Eosinophils Absolute: 0.2 10*3/uL (ref 0.0–0.5)
Eosinophils Relative: 3 %
HCT: 42.6 % (ref 36.0–46.0)
Hemoglobin: 13.4 g/dL (ref 12.0–15.0)
Immature Granulocytes: 0 %
Lymphocytes Relative: 37 %
Lymphs Abs: 2.6 10*3/uL (ref 0.7–4.0)
MCH: 28 pg (ref 26.0–34.0)
MCHC: 31.5 g/dL (ref 30.0–36.0)
MCV: 88.9 fL (ref 80.0–100.0)
Monocytes Absolute: 0.7 10*3/uL (ref 0.1–1.0)
Monocytes Relative: 10 %
Neutro Abs: 3.3 10*3/uL (ref 1.7–7.7)
Neutrophils Relative %: 49 %
Platelets: 262 10*3/uL (ref 150–400)
RBC: 4.79 MIL/uL (ref 3.87–5.11)
RDW: 15.9 % — ABNORMAL HIGH (ref 11.5–15.5)
WBC: 6.9 10*3/uL (ref 4.0–10.5)
nRBC: 0 % (ref 0.0–0.2)

## 2019-10-12 LAB — VITAMIN D 25 HYDROXY (VIT D DEFICIENCY, FRACTURES): Vit D, 25-Hydroxy: 32.1 ng/mL (ref 30–100)

## 2019-10-12 MED ORDER — PREGABALIN 50 MG PO CAPS: 50.0000 mg | ORAL_CAPSULE | Freq: Every day | ORAL | 0 refills | Status: DC

## 2019-10-12 NOTE — Progress Notes (Signed)
[place]   Telephone:([phone] Fax:([phone]) [phone]   Clinic Follow up Note   Patient Care Team: Adria Dill Leonia Reader, FNP as PCP - General (Internal Medicine) [place], Laurice Record, MD (Inactive) (Orthopedic Surgery) Rutherford Guys, MD (Ophthalmology) Lavonna Monarch, MD (Dermatology) Truitt Merle, MD as Consulting Physician (Hematology) Rolm Bookbinder, MD as Consulting Physician (General Surgery) Kyung Rudd, MD as Consulting Physician (Radiation Oncology) Gardenia Phlegm, NP as Nurse Practitioner (Hematology and Oncology) 10/12/2019  CHIEF COMPLAINT: F/u right breast cancer   SUMMARY OF ONCOLOGIC HISTORY: Oncology History Overview Note  Cancer Staging Malignant neoplasm of overlapping sites of right breast in female, estrogen receptor positive ([place]) Staging form: Breast, AJCC 8th Edition - Clinical stage from 03/23/2016: Stage IIA (cT3, cN1, cM0, G2, ER: Positive, PR: Positive, HER2: Negative) - Signed by Truitt Merle, MD on 03/31/2016 - Pathologic stage from 05/19/2016: Stage IA (pT2(m), pN1a, cM0, G1, ER: Positive, PR: Positive, HER2: Negative) - Signed by Truitt Merle, MD on 06/18/2016     Malignant neoplasm of overlapping sites of right breast in female, estrogen receptor positive ([place])  03/17/2016 Mammogram   B/l diagnostic mammogram and righ US showed a 3.6cm irregular mass in the right breast 11:00 position, posterior depth, there is a enlarged lymph node in the right axilla is highly suspicious for malignancy. additional 7 mm oval mass in the right breast lower outer quadrant is suspicious for malignancy.   03/23/2016 Initial Biopsy   Right breast 9:30 position biopsy showed invasive ductal carcinoma, grade 1. 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   03/23/2016 Receptors her2   Both breast and node biopsy tumor ER 100% positive, PR 70-95% positive, HER-2 negative, Ki-67 40%   03/23/2016 Initial Diagnosis   Malignant neoplasm of  upper-outer quadrant of right breast in female, estrogen receptor positive ([place])   03/25/2016 Initial Biopsy   Right breast 11:00 position core needle biopsy showed invasive duct carcinoma, grade 2.    03/25/2016 Receptors her2   ER 95% positive, PR 90% positive, HER-2 negative, Ki-67 15%   03/25/2016 Miscellaneous   Mammaprint showed low risk type A with index +0.105   03/30/2016 Imaging   Bilateral breast MRI with and without contrast showed a large lobulated enhancing mass within the upper-outer and lower outer right breast with surrounding nodularity, measuring 6.1 x 4.4 x 5.6 cm. Multiple critically sick and right axillary lymph nodes are demonstrated measuring up to 1.5 cm.    04/01/2016 -  Anti-estrogen oral therapy   Exemestane 25 mg daily, plan for 7 years. Switched to Anastrozole 55m in 06/2017 due to joint pain and hot flashes. Due to persistent side effects I changed her to Tamoxifen in 09/2018. She stopped Tamoxifen and switched back to anastrozole in 01/2019 because it was more tolerable.    05/19/2016 Surgery   Bilateral mastectomy and right axillary regional lymph node resection.   05/19/2016 Pathology Results   -Right axillary regional lymph node resection revealed metastatic carcinoma in 2/7 lymph nodes. -Left simple mastectomy revealed lobular neoplasia and fibrocystic changes with adenosis and calcifications. 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 -Skin of the right mastectomy flap was benign. -mpT2, pN1a   06/25/2016 - 08/11/2016 Radiation Therapy   Site/dose:    1. 4 field Right breast was treated to 50.4 Gy in 25 fractions at 1.8 Gy per  fraction. 2. The Right breast was boosted to 10 Gy in 5 fractions at 2 Gy per fraction.   04/15/2017 Genetic Testing   The  patient had genetic testing due to a personal history of breast cancer and family history of breast, ovarian, and colon cancer. The Common Hereditary Cancer Panel was ordered.  The Common Hereditary Cancer Panel offered by [facility] includes sequencing and/or deletion duplication testing of the following 47 genes: APC, ATM, AXIN2, BARD1, BMPR1A, BRCA1, BRCA2, BRIP1, CDH1, CDKN2A (p14ARF), CDKN2A (p16INK4a), CKD4, CHEK2, CTNNA1, DICER1, EPCAM (Deletion/duplication testing only), GREM1 (promoter region deletion/duplication testing only), KIT, MEN1, MLH1, MSH2, MSH3, MSH6, MUTYH, NBN, NF1, NHTL1, PALB2, PDGFRA, PMS2, POLD1, POLE, PTEN, RAD50, RAD51C, RAD51D, SDHB, SDHC, SDHD, SMAD4, SMARCA4. STK11, TP53, TSC1, TSC2, and VHL.  The following genes were evaluated for sequence changes only: SDHA and HOXB13 c.251G>A variant only.  Results: Negative, no pathogenic variants identified. The date of this test report is 04/15/2017   06/28/2017 Surgery   COMPLEX REVISION OF BACK SCAR and LEFT BREAST RECONSTRUCTION WITH SILICONE IMPLANT EXCHANGE AND ACELLULARDERMIS TO LEFT CHEST by Dr. Iran Planas 06/28/17     CURRENT THERAPY:  Exemestane 25 mg daily, plan for 7 years. Switched to Anastrozole 69m in 06/2017 due to joint pain and hot flashes. Due to persistent side effects I changed her to Tamoxifen in 09/2018. She stopped Tamoxifen and switched back to anastrozole in 01/2019 because it was more tolerable.   INTERVAL HISTORY: Ms. MPharissreturns for f/u as scheduled. She was last seen 04/14/2019.  She has bad hot flashes, gabapentin and Effexor are not helping.  She has tried acupuncture for other things in the past and knows it does not work.  She has chronic back pain and stiffness in her hands.  For several months she has right arm weakness and tingling in the fingertips mostly in the morning but also during the day most days.  She has to pick her arm up sometimes in the morning.  The tingling became more recent.  She denies  injury, strain.  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  She has migraines at baseline, no worse.  Denies dizziness, imbalance, nausea/vomiting.  She has chronic sharp pains in her right chest wall.   MEDICAL HISTORY:  Past Medical History:  Diagnosis Date  . Anxiety   . Anxiety disorder   . Asthma    h/o asthma as a child  . Breast cancer ([place]   . [place]   right breast cancer  . Cholelithiasis   . Chronic kidney disease    obstruction of R kidney, ( not a stone) - currently resolved   . Depression   . Diverticulosis   . DJD (degenerative joint disease)    hands & back   . Dyspnea    resolved since she stopped smoking   . Epigastric abdominal pain   . Esophageal stricture   . Family history of adverse reaction to anesthesia    daughter has N&V, takes long time to wake up   . Family history of colon cancer   . Family history of ovarian cancer   . Family hx of colon cancer   . 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 Ganglion cyst   . GERD (gastroesophageal reflux disease)   . Headache    low grade currently , family history of migraines   . Hemorrhoid   . History of radiation therapy 05/03 - 08/11/2016   1. 4 field Right breast  was treated to 50.4 Gy in 25 fractions at 1.8 Gy per fraction. 2. The Right breast was boosted to 10 Gy in 5 fractions at 2 Gy per fraction.  . Hypothyroidism   . Plantar fasciitis, bilateral   . PONV (postoperative nausea and vomiting)    gets anxious with the mask on her face, also remarks that the scop. patch has helped in the past       SURGICAL HISTORY: Past Surgical History:  Procedure Laterality Date  . ABDOMINAL HYSTERECTOMY    . BREAST IMPLANT REMOVAL Bilateral 01/24/2019   Procedure: REMOVAL BREAST IMPLANTS;  Surgeon: Irene Limbo, MD;  Location: [facility];  Service: Plastics;  Laterality: Bilateral;  . BREAST RECONSTRUCTION WITH PLACEMENT OF TISSUE EXPANDER AND FLEX HD (ACELLULAR  HYDRATED DERMIS) Bilateral 05/19/2016   Procedure: BREAST RECONSTRUCTION WITH PLACEMENT OF TISSUE EXPANDER AND ALLODERM PLACEMENT;  Surgeon: Irene Limbo, MD;  Location: [facility];  Service: Plastics;  Laterality: Bilateral;  . BREAST RECONSTRUCTION WITH PLACEMENT OF TISSUE EXPANDER AND FLEX HD (ACELLULAR HYDRATED DERMIS) Left 06/28/2017   Procedure: LEFT BREAST RECONSTRUCTION WITH SILICONE IMPLANT EXCHANGE AND ACELLULARDERMIS TO LEFT CHEST;  Surgeon: Irene Limbo, MD;  Location: [facility];  Service: Plastics;  Laterality: Left;  . CAPSULECTOMY Bilateral 01/24/2019   Procedure: CAPSULECTOMY;  Surgeon: Irene Limbo, MD;  Location: [facility];  Service: Plastics;  Laterality: Bilateral;  . CESAREAN SECTION  1988  . CHOLECYSTECTOMY    . HEMORRHOID SURGERY    . LAPAROSCOPIC BILATERAL SALPINGECTOMY N/A 10/26/2012   Procedure: operative laparoscopy with lysis of adhesions;  Surgeon: Thornell Sartorius, MD;  Location: [facility];  Service: Gynecology;  Laterality: N/A;  . LESION EXCISION WITH COMPLEX REPAIR Right 06/09/2019   Procedure: COMPLEX REPAIR RIGHT CHEST 15CM;  Surgeon: Irene Limbo, MD;  Location: [facility];  Service: Plastics;  Laterality: Right;  . MASTECTOMY Bilateral   . MASTECTOMY WITH RADIOACTIVE SEED GUIDED EXCISION AND AXILLARY SENTINEL LYMPH NODE BIOPSY Bilateral 05/19/2016   Procedure: RIGHT SKIN SPARING MASTECTOMY WITH RIGHT RADIOACTIVE SEED TARGETED DISSECTION AND RIGHT SENTINEL LYMPH NODE BIOPSY, LEFT PROPHYLACTIC SKIN SPARING MASTECTOMY;  Surgeon: Rolm Bookbinder, MD;  Location: [facility];  Service: General;  Laterality: Bilateral;  . REMOVAL OF BILATERAL TISSUE EXPANDERS WITH PLACEMENT OF BILATERAL BREAST IMPLANTS Bilateral 01/29/2017   Procedure: REMOVAL OF BILATERAL TISSUE EXPANDERS WITH PLACEMENT OF BILATERAL SILICONE BREAST IMPLANTS, ALLODERM TO LEFT BREAST RECONSTRUCTION;  RIGHT LATISSUMUS FLAP;   Surgeon: Irene Limbo, MD;  Location: [facility];  Service: Plastics;  Laterality: Bilateral;  Requesting RNFA  . SCAR REVISION Right 06/28/2017   Procedure: COMPLEX REVISION OF BACK SCAR;  Surgeon: Irene Limbo, MD;  Location: [facility];  Service: Plastics;  Laterality: Right;  . TONSILLECTOMY    . TUBAL LIGATION    . urologic surgery for ureteropelvic junction obstruction      I have reviewed the social history and family history with the patient and they are unchanged from previous note.  ALLERGIES:  is allergic to adhesive [tape], promethazine hcl, and sulfa antibiotics.  MEDICATIONS:  Current Outpatient Medications  Medication Sig Dispense Refill  . acetaminophen (TYLENOL) 500 MG tablet Take 1,000 mg by mouth every 6 (six) hours as needed.    . ALPRAZolam (XANAX) 1 MG tablet Take 1 tablet (1 mg total) by mouth 3 (three) times daily. 90 tablet 0  . anastrozole (ARIMIDEX) 1 MG tablet TAKE 1 TABLET BY MOUTH EVERY DAY 90  tablet 1  . celecoxib (CELEBREX) 200 MG capsule Take 1 capsule (200 mg total) by mouth daily. 30 capsule 2  . Cetirizine HCl 10 MG CAPS Take 1 capsule (10 mg total) by mouth daily for 10 days. 10 capsule 0  . levothyroxine (SYNTHROID, LEVOTHROID) 50 MCG tablet Take 50 mcg by mouth daily before breakfast.    . methocarbamol (ROBAXIN) 500 MG tablet Take 1 tablet (500 mg total) by mouth every 8 (eight) hours as needed for muscle spasms. 20 tablet 0  . omeprazole (PRILOSEC) 40 MG capsule TAKE ONE CAPSULE BY MOUTH EVERY DAY 30 capsule 4  . oxyCODONE (ROXICODONE) 5 MG immediate release tablet Take 1 tablet (5 mg total) by mouth every 6 (six) hours as needed. 20 tablet 0  . pregabalin (LYRICA) 50 MG capsule Take 1 capsule (50 mg total) by mouth at bedtime. 90 capsule 0  . venlafaxine XR (EFFEXOR-XR) 150 MG 24 hr capsule TAKE 1 CAPSULE BY MOUTH EVERY DAY WITH BREAKFAST 90 capsule 0  . venlafaxine XR (EFFEXOR-XR) 75 MG 24 hr capsule TAKE 1 CAPSULE (75 MG TOTAL)  BY MOUTH DAILY WITH BREAKFAST. 90 capsule 2   No current facility-administered medications for this visit.    PHYSICAL EXAMINATION:  Vitals:   10/12/19 1523  BP: 131/80  Pulse: 89  Resp: 17  Temp: 97.8 F (36.6 C)  SpO2: 99%   Filed Weights   10/12/19 1523  Weight: 168 lb 1.6 oz (76.2 kg)    GENERAL:alert, no distress and comfortable SKIN: No rash to exposed skin EYES: sclera clear NECK: Without mass LYMPH:  no palpable cervical or supraclavicular lymphadenopathy LUNGS:  normal breathing effort HEART: no lower extremity edema Musculoskeletal: No focal spinal tenderness NEURO: alert & oriented x 3 with fluent speech, normal equal strength and sensation in the upper arms bilaterally Breast exam: S/p bilateral mastectomy.  No nodularity or mass along the chest wall, incisions, or either axilla that I could appreciate   LABORATORY DATA:  I have reviewed the data as listed CBC Latest Ref Rng & Units 10/12/2019 04/14/2019 10/14/2018  WBC 4.0 - 10.5 K/uL 6.9 6.6 7.4  Hemoglobin 12.0 - 15.0 g/dL 13.4 14.2 13.3  Hematocrit 36 - 46 % 42.6 44.5 41.1  Platelets 150 - 400 K/uL 262 266 248     CMP Latest Ref Rng & Units 10/12/2019 04/14/2019 10/14/2018  Glucose 70 - 99 mg/dL 96 105(H) 116(H)  BUN 6 - 20 mg/dL '15 16 15  ' Creatinine 0.44 - 1.00 mg/dL 0.93 1.00 1.18(H)  Sodium 135 - 145 mmol/L 140 140 141  Potassium 3.5 - 5.1 mmol/L 3.8 3.7 4.0  Chloride 98 - 111 mmol/L 104 102 106  CO2 22 - 32 mmol/L '24 28 22  ' Calcium 8.9 - 10.3 mg/dL 9.8 9.2 9.0  Total Protein 6.5 - 8.1 g/dL 7.0 7.2 6.9  Total Bilirubin 0.3 - 1.2 mg/dL 0.5 0.4 0.3  Alkaline Phos 38 - 126 U/L 110 108 116  AST 15 - 41 U/L 28 42(H) 28  ALT 0 - 44 U/L 23 43 31      RADIOGRAPHIC STUDIES: I have personally reviewed the radiological images as listed and agreed with the findings in the report. No results found.   ASSESSMENT & PLAN: KEIRAN SIAS is a 59 y.o. female with    1. Malignant new overlapping sites  of right breast, mpT2 pN1a, stage IA, G1, ER positive, PR positive, HER-2 negative, mammaprint low risk luminal type A -Diagnosed in 02/2016. Treated  with bilateral mastectomy and reconstruction with implants which have ultimately been removed,and adjuvantradiation.  -Shestartedantiestrogen therapy Exemestane on2/7/18. Due to joint pain and hot flashes Exemestane was switched to Anastrozole in 06/2017. Due to lack of improvement on side effects she was switched back to Tamoxifen in 09/2018. She switched back to anastrozole which was more tolerate in 01/2019. Side effects of hot flashes and joint pain more tolerable now, but still moderate.  Plan to continue for a total of 5-7 years if tolerable.  -No role for mammogram after mastectomy, continue surveillance and anastrozole  2.  Right arm weakness and tingling in fingertips -Onset few months ago, worse in the morning -imaging reviewed showing cervical disc bulges in the C spine in 2009 -neuro exam nonfocal  3. Hot flashes -Currently on Effexor 225 mg daily. She cannot tolerate Gabapentin over 165m also not very helpful -We will discontinue gabapentin and try Lyrica  4. Joint pain, arthritis  -Had worsened on AI, She is currently on Anastrozole which is more tolerable.  -On Celebrex and oxycodone per PCP  5. Osteopenia  -Her 04/2014 DEXA shows osteopenia with lowest T-score -1.8 at right hip. -repeat every 2 years per PCP [facility] for [facility]  Disposition: Ms. MPabstis clinically doing well.  Breast exam is benign, labs unremarkable.  No clinical concern for breast cancer recurrence.  She continues anastrozole which she tolerates fairly well, hot flashes are moderate to severe but willing to tolerate.  Due to lack of improvement with gabapentin, I recommend to discontinue and try Lyrica, continue Effexor.  She reports few months history of right arm weakness and tingling, no injury or precipitating event.  Neuro exam is normal.   She has known arthritis and mild disc bulging and C2-3, C5-6 and C6-7 on imaging from 2009.  Her symptoms may be related to arthritis versus worsening of the disc bulge.  Her arthritis is managed by PCP MThedora Hinders I recommend for her to follow-up there for possible repeat cervical MRI.  If that work-up is unrevealing, and weakness progresses, and/or she develops worsening headaches, dizziness, imbalance, or N/V I will obtain brain MRI W/WO contrast to rule out metastasis.  The CBC and CMP from today were reviewed.  She will return for lab and follow-up in 6 months, or sooner if needed.  I will send my note to her PCP.  All questions were answered. The patient knows to call the clinic with any problems, questions or concerns. No barriers to learning was detected. Total encounter time was 30 minutes.      LAlla Feeling NP 10/12/19

## 2019-10-13 ENCOUNTER — Telehealth: Payer: Self-pay | Admitting: Nurse Practitioner

## 2019-10-13 NOTE — Telephone Encounter (Signed)
Scheduled per 8/19 los. Pt is aware of appt time and date. 

## 2019-11-24 ENCOUNTER — Other Ambulatory Visit: Payer: Self-pay | Admitting: Hematology

## 2019-11-24 DIAGNOSIS — C50811 Malignant neoplasm of overlapping sites of right female breast: Secondary | ICD-10-CM

## 2020-01-21 ENCOUNTER — Other Ambulatory Visit: Payer: Self-pay | Admitting: Hematology

## 2020-01-21 DIAGNOSIS — C50811 Malignant neoplasm of overlapping sites of right female breast: Secondary | ICD-10-CM

## 2020-04-10 DIAGNOSIS — U099 Post covid-19 condition, unspecified: Secondary | ICD-10-CM | POA: Diagnosis not present

## 2020-04-10 DIAGNOSIS — U071 COVID-19: Secondary | ICD-10-CM | POA: Diagnosis not present

## 2020-04-12 ENCOUNTER — Telehealth: Payer: Self-pay | Admitting: Nurse Practitioner

## 2020-04-12 ENCOUNTER — Inpatient Hospital Stay: Payer: BC Managed Care – PPO | Admitting: Hematology

## 2020-04-12 ENCOUNTER — Inpatient Hospital Stay: Payer: BC Managed Care – PPO

## 2020-04-12 NOTE — Telephone Encounter (Signed)
Changed follow-up appointment to virtual per provider's request. Patient is aware of changes.

## 2020-04-14 NOTE — Progress Notes (Signed)
Duplicate

## 2020-04-15 ENCOUNTER — Telehealth: Payer: Self-pay | Admitting: Nurse Practitioner

## 2020-04-15 ENCOUNTER — Encounter: Payer: Self-pay | Admitting: Nurse Practitioner

## 2020-04-15 ENCOUNTER — Inpatient Hospital Stay: Payer: BC Managed Care – PPO | Admitting: Nurse Practitioner

## 2020-04-15 ENCOUNTER — Other Ambulatory Visit: Payer: Self-pay

## 2020-04-15 ENCOUNTER — Inpatient Hospital Stay: Payer: BC Managed Care – PPO | Attending: Nurse Practitioner

## 2020-04-15 VITALS — BP 140/62 | HR 76 | Temp 97.8°F | Resp 18 | Wt 164.0 lb

## 2020-04-15 DIAGNOSIS — C50811 Malignant neoplasm of overlapping sites of right female breast: Secondary | ICD-10-CM | POA: Diagnosis not present

## 2020-04-15 DIAGNOSIS — F32A Depression, unspecified: Secondary | ICD-10-CM | POA: Insufficient documentation

## 2020-04-15 DIAGNOSIS — N189 Chronic kidney disease, unspecified: Secondary | ICD-10-CM | POA: Diagnosis not present

## 2020-04-15 DIAGNOSIS — Z1501 Genetic susceptibility to malignant neoplasm of breast: Secondary | ICD-10-CM | POA: Diagnosis not present

## 2020-04-15 DIAGNOSIS — C773 Secondary and unspecified malignant neoplasm of axilla and upper limb lymph nodes: Secondary | ICD-10-CM | POA: Diagnosis not present

## 2020-04-15 DIAGNOSIS — Z9079 Acquired absence of other genital organ(s): Secondary | ICD-10-CM | POA: Insufficient documentation

## 2020-04-15 DIAGNOSIS — K219 Gastro-esophageal reflux disease without esophagitis: Secondary | ICD-10-CM | POA: Diagnosis not present

## 2020-04-15 DIAGNOSIS — T50905A Adverse effect of unspecified drugs, medicaments and biological substances, initial encounter: Secondary | ICD-10-CM

## 2020-04-15 DIAGNOSIS — Z170 Estrogen receptor positive status [ER+]: Secondary | ICD-10-CM

## 2020-04-15 DIAGNOSIS — C50411 Malignant neoplasm of upper-outer quadrant of right female breast: Secondary | ICD-10-CM | POA: Insufficient documentation

## 2020-04-15 DIAGNOSIS — Z90710 Acquired absence of both cervix and uterus: Secondary | ICD-10-CM | POA: Diagnosis not present

## 2020-04-15 DIAGNOSIS — R232 Flushing: Secondary | ICD-10-CM

## 2020-04-15 DIAGNOSIS — Z87891 Personal history of nicotine dependence: Secondary | ICD-10-CM | POA: Insufficient documentation

## 2020-04-15 DIAGNOSIS — Z79811 Long term (current) use of aromatase inhibitors: Secondary | ICD-10-CM | POA: Insufficient documentation

## 2020-04-15 DIAGNOSIS — F419 Anxiety disorder, unspecified: Secondary | ICD-10-CM | POA: Diagnosis not present

## 2020-04-15 DIAGNOSIS — M8580 Other specified disorders of bone density and structure, unspecified site: Secondary | ICD-10-CM | POA: Diagnosis not present

## 2020-04-15 DIAGNOSIS — E039 Hypothyroidism, unspecified: Secondary | ICD-10-CM | POA: Insufficient documentation

## 2020-04-15 DIAGNOSIS — Z79899 Other long term (current) drug therapy: Secondary | ICD-10-CM | POA: Diagnosis not present

## 2020-04-15 DIAGNOSIS — J45909 Unspecified asthma, uncomplicated: Secondary | ICD-10-CM | POA: Insufficient documentation

## 2020-04-15 DIAGNOSIS — Z9013 Acquired absence of bilateral breasts and nipples: Secondary | ICD-10-CM | POA: Insufficient documentation

## 2020-04-15 LAB — COMPREHENSIVE METABOLIC PANEL
ALT: 25 U/L (ref 0–44)
AST: 25 U/L (ref 15–41)
Albumin: 4.1 g/dL (ref 3.5–5.0)
Alkaline Phosphatase: 104 U/L (ref 38–126)
Anion gap: 11 (ref 5–15)
BUN: 15 mg/dL (ref 6–20)
CO2: 25 mmol/L (ref 22–32)
Calcium: 8.8 mg/dL — ABNORMAL LOW (ref 8.9–10.3)
Chloride: 106 mmol/L (ref 98–111)
Creatinine, Ser: 1.04 mg/dL — ABNORMAL HIGH (ref 0.44–1.00)
GFR, Estimated: >60 mL/min (ref >60)
Glucose, Bld: 107 mg/dL — ABNORMAL HIGH (ref 70–99)
Potassium: 4.2 mmol/L (ref 3.5–5.1)
Sodium: 142 mmol/L (ref 135–145)
Total Bilirubin: 0.4 mg/dL (ref 0.3–1.2)
Total Protein: 7 g/dL (ref 6.5–8.1)

## 2020-04-15 LAB — CBC WITH DIFFERENTIAL/PLATELET
Abs Immature Granulocytes: 0.01 10*3/uL (ref 0.00–0.07)
Basophils Absolute: 0.1 10*3/uL (ref 0.0–0.1)
Basophils Relative: 2 %
Eosinophils Absolute: 0.2 10*3/uL (ref 0.0–0.5)
Eosinophils Relative: 4 %
HCT: 41.4 % (ref 36.0–46.0)
Hemoglobin: 13.4 g/dL (ref 12.0–15.0)
Immature Granulocytes: 0 %
Lymphocytes Relative: 39 %
Lymphs Abs: 1.9 10*3/uL (ref 0.7–4.0)
MCH: 28.1 pg (ref 26.0–34.0)
MCHC: 32.4 g/dL (ref 30.0–36.0)
MCV: 86.8 fL (ref 80.0–100.0)
Monocytes Absolute: 0.4 10*3/uL (ref 0.1–1.0)
Monocytes Relative: 8 %
Neutro Abs: 2.2 10*3/uL (ref 1.7–7.7)
Neutrophils Relative %: 47 %
Platelets: 250 10*3/uL (ref 150–400)
RBC: 4.77 MIL/uL (ref 3.87–5.11)
RDW: 15.9 % — ABNORMAL HIGH (ref 11.5–15.5)
WBC: 4.7 10*3/uL (ref 4.0–10.5)
nRBC: 0 % (ref 0.0–0.2)

## 2020-04-15 MED ORDER — CLONIDINE HCL 0.1 MG PO TABS: 0.1000 mg | ORAL_TABLET | Freq: Every day | ORAL | 3 refills | Status: DC

## 2020-04-15 MED ORDER — ANASTROZOLE 1 MG PO TABS: 1.0000 mg | ORAL_TABLET | Freq: Every day | ORAL | 3 refills | Status: DC

## 2020-04-15 NOTE — Telephone Encounter (Addendum)
Scheduled follow-up appointment and faxed referral to [facility] per 2/21 los. Patient is aware.

## 2020-04-15 NOTE — Progress Notes (Addendum)
[place]   Telephone:([phone]) [phone] Fax:(336) [phone]   Clinic Follow up Note   Patient Care Team: Katie Commons, FNP as PCP - General (Internal Medicine) [name], Katie Record, MD (Inactive) (Orthopedic Surgery) Katie Guys, MD (Ophthalmology) Katie Monarch, MD (Dermatology) Katie Merle, MD as Consulting Physician (Hematology) Katie Bookbinder, MD as Consulting Physician (General Surgery) Katie Rudd, MD as Consulting Physician (Radiation Oncology) Katie Phlegm, NP as Nurse Practitioner (Hematology and Oncology) 04/15/2020   I connected with [name] on 04/15/20 at  9:15 AM EST by video enabled telemedicine visit and verified that I am speaking with the correct person using two identifiers.   I discussed the limitations, risks, security and privacy concerns of performing an evaluation and management service by telemedicine and the availability of in-person appointments. I also discussed with the patient that there may be a patient responsible charge related to this service. The patient expressed understanding and agreed to proceed.   Other persons participating in the visit and their role in the encounter: None  Patient's location: [name] exam room Provider's location: home office   CHIEF COMPLAINT: Follow up right breast cancer   SUMMARY OF ONCOLOGIC HISTORY: Oncology History Overview Note  Cancer Staging Malignant neoplasm of overlapping sites of right breast in female, estrogen receptor positive ([place]) Staging form: Breast, AJCC 8th Edition - Clinical stage from 03/23/2016: Stage IIA (cT3, cN1, cM0, G2, ER: Positive, PR: Positive, HER2: Negative) - Signed by Katie Merle, MD on 03/31/2016 - Pathologic stage from 05/19/2016: Stage IA (pT2(m), pN1a, cM0, G1, ER: Positive, PR: Positive, HER2: Negative) - Signed by Katie Merle, MD on 06/18/2016     Malignant neoplasm of overlapping sites of right breast in female, estrogen receptor positive ([place])   03/17/2016 Mammogram   B/l diagnostic mammogram and righ US showed a 3.6cm irregular mass in the right breast 11:00 position, posterior depth, there is a enlarged lymph node in the right axilla is highly suspicious for malignancy. additional 7 mm oval mass in the right breast lower outer quadrant is suspicious for malignancy.   03/23/2016 Initial Biopsy   Right breast 9:30 position biopsy showed invasive ductal carcinoma, grade 1. 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   03/23/2016 Receptors her2   Both breast and node biopsy tumor ER 100% positive, PR 70-95% positive, HER-2 negative, Ki-67 40%   03/23/2016 Initial Diagnosis   Malignant neoplasm of upper-outer quadrant of right breast in female, estrogen receptor positive ([place])   03/25/2016 Initial Biopsy   Right breast 11:00 position core needle biopsy showed invasive duct carcinoma, grade 2.    03/25/2016 Receptors her2   ER 95% positive, PR 90% positive, HER-2 negative, Ki-67 15%   03/25/2016 Miscellaneous   Mammaprint showed low risk type A with index +0.105   03/30/2016 Imaging   Bilateral breast MRI with and without contrast showed a large lobulated enhancing mass within the upper-outer and lower outer right breast with surrounding nodularity, measuring 6.1 x 4.4 x 5.6 cm. Multiple critically sick and right axillary lymph nodes are demonstrated measuring up to 1.5 cm.    04/01/2016 -  Anti-estrogen oral therapy   Exemestane 25 mg daily, plan for 7 years. Switched to Anastrozole 24m in 06/2017 due to joint pain and hot flashes. Due to persistent side effects I changed her to Tamoxifen in 09/2018. She stopped Tamoxifen and switched back to anastrozole in 01/2019 because it was more tolerable.    05/19/2016 Surgery   Bilateral mastectomy  and right axillary regional lymph node resection.   05/19/2016 Pathology Results   -Right axillary regional lymph node resection revealed metastatic carcinoma in 2/7 lymph  nodes. -Left simple mastectomy revealed lobular neoplasia and fibrocystic changes with adenosis and calcifications. 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 -Skin of the right mastectomy flap was benign. -mpT2, pN1a   06/25/2016 - 08/11/2016 Radiation Therapy   Site/dose:    1. 4 field Right breast was treated to 50.4 Gy in 25 fractions at 1.8 Gy per fraction. 2. The Right breast was boosted to 10 Gy in 5 fractions at 2 Gy per fraction.   04/15/2017 Genetic Testing   The patient had genetic testing due to a personal history of breast cancer and family history of breast, ovarian, and colon cancer. The Common Hereditary Cancer Panel was ordered.  The Common Hereditary Cancer Panel offered by [facility] includes sequencing and/or deletion duplication testing of the following 47 genes: APC, ATM, AXIN2, BARD1, BMPR1A, BRCA1, BRCA2, BRIP1, CDH1, CDKN2A (p14ARF), CDKN2A (p16INK4a), CKD4, CHEK2, CTNNA1, DICER1, EPCAM (Deletion/duplication testing only), GREM1 (promoter region deletion/duplication testing only), KIT, MEN1, MLH1, MSH2, MSH3, MSH6, MUTYH, NBN, NF1, NHTL1, PALB2, PDGFRA, PMS2, POLD1, POLE, PTEN, RAD50, RAD51C, RAD51D, SDHB, SDHC, SDHD, SMAD4, SMARCA4. STK11, TP53, TSC1, TSC2, and VHL.  The following genes were evaluated for sequence changes only: SDHA and HOXB13 c.251G>A variant only.  Results: Negative, no pathogenic variants identified. The date of this test report is 04/15/2017   06/28/2017 Surgery   COMPLEX REVISION OF BACK SCAR and LEFT BREAST RECONSTRUCTION WITH SILICONE IMPLANT EXCHANGE AND ACELLULARDERMIS TO LEFT CHEST by Dr. Iran Hogan 06/28/17     CURRENT THERAPY:  Exemestane 25 mg daily, plan for 7 years. Switched to Anastrozole 64m in 06/2017 due to joint pain and hot flashes. Due  to persistent side effects I changed her to Tamoxifen in 09/2018. She stopped Tamoxifen and switched back to anastrozole in 01/2019 because it was more tolerable.  INTERVAL HISTORY: Ms. MBoyanpresents virtually for 6 month f/up as scheduled. She was last seen in person 10/12/19. She continues anastrozole. Effexor at max dose is helping hot flashes and to feel "mellow." She has tried acupuncture, gabapentin and lyrica for hot flashes, not helpful and lyrica keeps her awake. She wants to try something else. BP's normal to little high. Arthritis is mostly stable, except in bad weather. Denies new bone pain. She recently had COVID infection, with mild residual congestion, cough, and mild dyspnea. [facility]. She has new concern at right chest wall, could be scar tissue, area is "not smooth." Denies other concerns such as abdominal pain/bloating, change in bowel habits, bleeding, or other issues. She is losing weight intentionally through cool sculpting.    MEDICAL HISTORY:  Past Medical History:  Diagnosis Date  . Anxiety   . Anxiety disorder   . Asthma    h/o asthma as a child  . Breast cancer ([place]   . Cancer ([place] 04/2016   right breast cancer  . Cholelithiasis   . Chronic kidney disease    obstruction of R kidney, ( not a stone) - currently resolved   . Depression   . Diverticulosis   . DJD (degenerative joint disease)    hands & back   . Dyspnea    resolved since she stopped smoking   .  Epigastric abdominal pain   . Esophageal stricture   . Family history of adverse reaction to anesthesia    daughter has N&V, takes long time to wake up   . Family history of colon cancer   . Family history of ovarian cancer   . Family hx of colon cancer   . 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 Ganglion cyst   . GERD (gastroesophageal reflux disease)   . Headache    low grade currently , family history of migraines   . Hemorrhoid   . History of radiation  therapy 05/03 - 08/11/2016   1. 4 field Right breast was treated to 50.4 Gy in 25 fractions at 1.8 Gy per fraction. 2. The Right breast was boosted to 10 Gy in 5 fractions at 2 Gy per fraction.  . Hypothyroidism   . Plantar fasciitis, bilateral   . PONV (postoperative nausea and vomiting)    gets anxious with the mask on her face, also remarks that the scop. patch has helped in the past       SURGICAL HISTORY: Past Surgical History:  Procedure Laterality Date  . ABDOMINAL HYSTERECTOMY    . BREAST IMPLANT REMOVAL Bilateral 01/24/2019   Procedure: REMOVAL BREAST IMPLANTS;  Surgeon: Irene Limbo, MD;  Location: [facility];  Service: Plastics;  Laterality: Bilateral;  . BREAST RECONSTRUCTION WITH PLACEMENT OF TISSUE EXPANDER AND FLEX HD (ACELLULAR HYDRATED DERMIS) Bilateral 05/19/2016   Procedure: BREAST RECONSTRUCTION WITH PLACEMENT OF TISSUE EXPANDER AND ALLODERM PLACEMENT;  Surgeon: Irene Limbo, MD;  Location: [facility];  Service: Plastics;  Laterality: Bilateral;  . BREAST RECONSTRUCTION WITH PLACEMENT OF TISSUE EXPANDER AND FLEX HD (ACELLULAR HYDRATED DERMIS) Left 06/28/2017   Procedure: LEFT BREAST RECONSTRUCTION WITH SILICONE IMPLANT EXCHANGE AND ACELLULARDERMIS TO LEFT CHEST;  Surgeon: Irene Limbo, MD;  Location: [facility];  Service: Plastics;  Laterality: Left;  . CAPSULECTOMY Bilateral 01/24/2019   Procedure: CAPSULECTOMY;  Surgeon: Irene Limbo, MD;  Location: [facility];  Service: Plastics;  Laterality: Bilateral;  . CESAREAN SECTION  1988  . CHOLECYSTECTOMY    . HEMORRHOID SURGERY    . LAPAROSCOPIC BILATERAL SALPINGECTOMY N/A 10/26/2012   Procedure: operative laparoscopy with lysis of adhesions;  Surgeon: Thornell Sartorius, MD;  Location: [facility];  Service: Gynecology;  Laterality: N/A;  . LESION EXCISION WITH COMPLEX REPAIR Right 06/09/2019   Procedure: COMPLEX REPAIR RIGHT CHEST 15CM;  Surgeon: Irene Limbo,  MD;  Location: [facility];  Service: Plastics;  Laterality: Right;  . MASTECTOMY Bilateral   . MASTECTOMY WITH RADIOACTIVE SEED GUIDED EXCISION AND AXILLARY SENTINEL LYMPH NODE BIOPSY Bilateral 05/19/2016   Procedure: RIGHT SKIN SPARING MASTECTOMY WITH RIGHT RADIOACTIVE SEED TARGETED DISSECTION AND RIGHT SENTINEL LYMPH NODE BIOPSY, LEFT PROPHYLACTIC SKIN SPARING MASTECTOMY;  Surgeon: Katie Bookbinder, MD;  Location: [facility];  Service: General;  Laterality: Bilateral;  . REMOVAL OF BILATERAL TISSUE EXPANDERS WITH PLACEMENT OF BILATERAL BREAST IMPLANTS Bilateral 01/29/2017   Procedure: REMOVAL OF BILATERAL TISSUE EXPANDERS WITH PLACEMENT OF BILATERAL SILICONE BREAST IMPLANTS, ALLODERM TO LEFT BREAST RECONSTRUCTION;  RIGHT LATISSUMUS FLAP;  Surgeon: Irene Limbo, MD;  Location: [facility];  Service: Plastics;  Laterality: Bilateral;  Requesting RNFA  . SCAR REVISION Right 06/28/2017   Procedure: COMPLEX REVISION OF BACK SCAR;  Surgeon: Irene Limbo, MD;  Location: [facility];  Service: Plastics;  Laterality: Right;  . TONSILLECTOMY    . TUBAL LIGATION    . urologic surgery  for ureteropelvic junction obstruction      I have reviewed the social history and family history with the patient and they are unchanged from previous note.  ALLERGIES:  is allergic to adhesive [tape], promethazine hcl, and sulfa antibiotics.  MEDICATIONS:  Current Outpatient Medications  Medication Sig Dispense Refill  . cloNIDine (CATAPRES) 0.1 MG tablet Take 1 tablet (0.1 mg total) by mouth daily. 30 tablet 3  . acetaminophen (TYLENOL) 500 MG tablet Take 1,000 mg by mouth every 6 (six) hours as needed.    . ALPRAZolam (XANAX) 1 MG tablet Take 1 tablet (1 mg total) by mouth 3 (three) times daily. 90 tablet 0  . anastrozole (ARIMIDEX) 1 MG tablet Take 1 tablet (1 mg total) by mouth daily. 90 tablet 3  . celecoxib (CELEBREX) 200 MG capsule Take 1 capsule (200 mg total) by  mouth daily. 30 capsule 2  . Cetirizine HCl 10 MG CAPS Take 1 capsule (10 mg total) by mouth daily for 10 days. 10 capsule 0  . levothyroxine (SYNTHROID, LEVOTHROID) 50 MCG tablet Take 50 mcg by mouth daily before breakfast.    . methocarbamol (ROBAXIN) 500 MG tablet Take 1 tablet (500 mg total) by mouth every 8 (eight) hours as needed for muscle spasms. 20 tablet 0  . omeprazole (PRILOSEC) 40 MG capsule TAKE ONE CAPSULE BY MOUTH EVERY DAY 30 capsule 4  . oxyCODONE (ROXICODONE) 5 MG immediate release tablet Take 1 tablet (5 mg total) by mouth every 6 (six) hours as needed. 20 tablet 0  . venlafaxine XR (EFFEXOR-XR) 150 MG 24 hr capsule TAKE 1 CAPSULE BY MOUTH EVERY DAY WITH BREAKFAST 90 capsule 0  . venlafaxine XR (EFFEXOR-XR) 75 MG 24 hr capsule TAKE 1 CAPSULE (75 MG TOTAL) BY MOUTH DAILY WITH BREAKFAST. 90 capsule 2   No current facility-administered medications for this visit.    PHYSICAL EXAMINATION: ECOG PERFORMANCE STATUS: 1 - Symptomatic but completely ambulatory   Vitals with BMI 10/12/2019 06/09/2019 06/09/2019  Height '5\' 6"'  - -  Weight 168 lbs 2 oz - -  BMI 18.84 - -  Systolic 166 063 -  Diastolic 80 84 -  Pulse 89 84 89   GENERAL:alert, no distress and comfortable EYES: sclera clear LYMPH:  no palpable axillary lymphadenopathy LUNGS:  normal breathing effort NEURO: alert & oriented x 3 with fluent speech, no focal motor/sensory deficits Breast exam performed by Dr. Burr Medico: s/p bilateral mastectomy. Incisions completely healed. Small 2-3 mm nodule at the right lateral mastectomy scar and scar tissue at right axillary incision. No nodularity or mass at the left chest wall or axilla   LABORATORY DATA:  I have reviewed the data as listed CBC Latest Ref Rng & Units 04/15/2020 10/12/2019 04/14/2019  WBC 4.0 - 10.5 K/uL 4.7 6.9 6.6  Hemoglobin 12.0 - 15.0 g/dL 13.4 13.4 14.2  Hematocrit 36.0 - 46.0 % 41.4 42.6 44.5  Platelets 150 - 400 K/uL 250 262 266     CMP Latest Ref Rng &  Units 04/15/2020 10/12/2019 04/14/2019  Glucose 70 - 99 mg/dL 107(H) 96 105(H)  BUN 6 - 20 mg/dL '15 15 16  ' Creatinine 0.44 - 1.00 mg/dL 1.04(H) 0.93 1.00  Sodium 135 - 145 mmol/L 142 140 140  Potassium 3.5 - 5.1 mmol/L 4.2 3.8 3.7  Chloride 98 - 111 mmol/L 106 104 102  CO2 22 - 32 mmol/L '25 24 28  ' Calcium 8.9 - 10.3 mg/dL 8.8(L) 9.8 9.2  Total Protein 6.5 - 8.1 g/dL 7.0 7.0 7.2  Total Bilirubin 0.3 - 1.2 mg/dL 0.4 0.5 0.4  Alkaline Phos 38 - 126 U/L 104 110 108  AST 15 - 41 U/L 25 28 42(H)  ALT 0 - 44 U/L 25 23 43      RADIOGRAPHIC STUDIES: I have personally reviewed the radiological images as listed and agreed with the findings in the report. No results found.   ASSESSMENT & PLAN: Katie Gaulin Mooreis a 60 y.o.femalewith    1. Malignant neoplasm overlapping sites of right breast, mpT2 pN1a, stage IA, G1, ER positive, PR positive, HER-2 negative, mammaprint low risk luminal type A -Diagnosed in 02/2016. Treated with bilateral mastectomy and reconstruction with implants which have ultimately been removed,and adjuvantradiation.  -Shestartedantiestrogen therapy Exemestane on2/7/18. Due to joint pain and hot flashes Exemestane was switched to Anastrozole in 06/2017. Due to lack of improvement on side effects she was switched back to Tamoxifen in 09/2018. She switched back to anastrozole which was more tolerate in 01/2019. Side effectsof hot flashes and joint painmore tolerable now, but still moderate.  Plan to continue for a total of 5-7 years if tolerable. -No role for mammogram after mastectomy, continue surveillance and anastrozole  2.  Right arm weakness and tingling in fingertips, 09/2019 -Onset few months ago, worse in the morning -imaging reviewed showing cervical disc bulges in the C spine in 2009 -neuro exam non-focal 09/2019 -resolved/not discussed today  3. Hot flashes -Currently on Effexor 225 mg daily.  -she tried acupuncture and medications; can not tolerate  gabapentin over 182m also not very helpful. Lyrica kept her awake and was not helpful -will try low dose clonidine and monitor BP  4.Joint pain, arthritis -Had worsened on AI, She is currently on Anastrozole which is more tolerable.  -On Celebrex and oxycodone per PCP -stable, manageable   5. Osteopenia  -Her 04/2014 DEXA showsosteopenia with lowest T-score -1.8 at right hip. -repeat every 2 years per PCP [facility] for [facility]  Disposition:  Ms. MCeciliois clinically doing well. She continues anastrzole, tolerating mostly well with stable arthritis and hot flashes which are partially managed on SSRI. She has tried acupuncture, gabapentin and lyrica without efficacy. The plan is to try low dose clonidine for HF, she will monitor BP and notify uKoreaif <100/60 and/or symptomatic. Continue AI for total 5-7 years if tolerable. Labs unremarkable.   She was seen with Dr. FBurr Medicowho performed breast exam and completed part of an employer insurance form. Breast exam shows small sub-cm nodule on right lateral mastectomy and right axillary incisions, likely scar tissue. The concern for recurrence is low, but will proceed with right chest wall/ax [place]ato r/o recurrence. I will call her with UKorearesults.   She will continue AI and surveillance, no role for mammogram s/p bilat mastectomy. Next visit in 6 months virtual, no lab.   Continue routine labs, annual wellness, DEXA this year, and other age approp cancer screenings [facility]    Orders Placed This Encounter  Procedures  . UKoreaBREAST COMPLETE UNI RIGHT INC AXILLA    Evaluate small nodule right chest wall and axilla    Standing Status:   Future    Standing Expiration Date:   04/15/2021    Scheduling Instructions:     [facility]    Order Specific Question:   Reason for Exam (SYMPTOM  OR DIAGNOSIS REQUIRED)    Answer:   s/p bilat mastectomy R breast cancer 2018, new small nodule    Order Specific Question:  Preferred imaging location?     Answer:   External   All questions were answered. The patient knows to call the clinic with any problems, questions or concerns. No barriers to learning were detected.     Alla Feeling, NP 04/15/20    Addendum  I have seen the patient, examined her. I agree with the assessment and and plan and have edited the notes.   Patient noticed some nodularity along her right mastectomy site, exam showed a few 2-3 mm nodules above her right mastectomy incision scar, likely benign, but will obtain US of right chest wall and axilla to rule out local recurrence. We discussed hot flush management, agree with adding clonidine in addition to Effexor. She will continue Anastrozole and breast cancer surveillance.   [name]  04/15/2020

## 2020-04-24 DIAGNOSIS — N6489 Other specified disorders of breast: Secondary | ICD-10-CM | POA: Diagnosis not present

## 2020-05-20 DIAGNOSIS — N6310 Unspecified lump in the right breast, unspecified quadrant: Secondary | ICD-10-CM | POA: Diagnosis not present

## 2020-05-20 DIAGNOSIS — N6031 Fibrosclerosis of right breast: Secondary | ICD-10-CM | POA: Diagnosis not present

## 2020-05-29 ENCOUNTER — Encounter: Payer: Self-pay | Admitting: Nurse Practitioner

## 2020-05-31 ENCOUNTER — Other Ambulatory Visit: Payer: Self-pay | Admitting: Hematology

## 2020-05-31 DIAGNOSIS — Z170 Estrogen receptor positive status [ER+]: Secondary | ICD-10-CM

## 2020-05-31 DIAGNOSIS — C50811 Malignant neoplasm of overlapping sites of right female breast: Secondary | ICD-10-CM

## 2020-06-24 DIAGNOSIS — M810 Age-related osteoporosis without current pathological fracture: Secondary | ICD-10-CM | POA: Diagnosis not present

## 2020-06-24 DIAGNOSIS — M8580 Other specified disorders of bone density and structure, unspecified site: Secondary | ICD-10-CM | POA: Diagnosis not present

## 2020-06-24 DIAGNOSIS — E039 Hypothyroidism, unspecified: Secondary | ICD-10-CM | POA: Diagnosis not present

## 2020-07-01 DIAGNOSIS — M810 Age-related osteoporosis without current pathological fracture: Secondary | ICD-10-CM | POA: Diagnosis not present

## 2020-07-01 DIAGNOSIS — Z01419 Encounter for gynecological examination (general) (routine) without abnormal findings: Secondary | ICD-10-CM | POA: Diagnosis not present

## 2020-07-01 DIAGNOSIS — Z0000 Encounter for general adult medical examination without abnormal findings: Secondary | ICD-10-CM | POA: Diagnosis not present

## 2020-07-01 DIAGNOSIS — Z1212 Encounter for screening for malignant neoplasm of rectum: Secondary | ICD-10-CM | POA: Diagnosis not present

## 2020-07-10 ENCOUNTER — Telehealth: Payer: Self-pay

## 2020-07-10 ENCOUNTER — Other Ambulatory Visit: Payer: Self-pay | Admitting: Hematology

## 2020-07-10 DIAGNOSIS — M816 Localized osteoporosis [Lequesne]: Secondary | ICD-10-CM

## 2020-07-10 DIAGNOSIS — M810 Age-related osteoporosis without current pathological fracture: Secondary | ICD-10-CM | POA: Insufficient documentation

## 2020-07-10 NOTE — Telephone Encounter (Signed)
I spoke with Ms Llera regarding Dr Ernestina Penna recommendations for her osteoporosis from bone scan done on 06/24/2020.  Her PCP recommended fosamax.  Dr Burr Medico recommends zometa every 6 months for 2 years due to her history of breast cancer.  I reviewed this with Ms Ficco.  She denies any dental issues and has regular check [place] and cleanings.  She agrees to take zometa instead of fosamax.

## 2020-07-11 ENCOUNTER — Telehealth: Payer: Self-pay | Admitting: Hematology

## 2020-07-11 ENCOUNTER — Other Ambulatory Visit: Payer: Self-pay | Admitting: Nurse Practitioner

## 2020-07-11 DIAGNOSIS — T50905A Adverse effect of unspecified drugs, medicaments and biological substances, initial encounter: Secondary | ICD-10-CM

## 2020-07-11 DIAGNOSIS — R232 Flushing: Secondary | ICD-10-CM

## 2020-07-11 NOTE — Telephone Encounter (Signed)
Scheduled appts per 5/19 sch msg. Pt aware.  

## 2020-08-09 ENCOUNTER — Telehealth: Payer: Self-pay | Admitting: Hematology

## 2020-08-09 NOTE — Telephone Encounter (Signed)
Rescheduled upcoming appointment due to provider's PAL. Patient is aware of changes. ?

## 2020-08-14 NOTE — Progress Notes (Signed)
[facility]   [phone] [phone]   Clinic Follow up Note   Patient Care Team: Prevost, Mary Ellen, FNP as PCP - General (Internal Medicine) Aplington, James P, MD (Inactive) (Orthopedic Surgery) Shapiro, Mark, MD (Ophthalmology) Tafeen, Stuart, MD (Dermatology) Feng, Yan, MD as Consulting Physician (Hematology) Wakefield, Matthew, MD as Consulting Physician (General Surgery) Moody, John, MD as Consulting Physician (Radiation Oncology) Causey, Lindsey Cornetto, NP as Nurse Practitioner (Hematology and Oncology)  Date of Service:  08/20/2020  CHIEF COMPLAINT: F/u on right breast cancer  SUMMARY OF ONCOLOGIC HISTORY: Oncology History Overview Note  Cancer Staging Malignant neoplasm of overlapping sites of right breast in female, estrogen receptor positive (HCC) Staging form: Breast, AJCC 8th Edition - Clinical stage from 03/23/2016: Stage IIA (cT3, cN1, cM0, G2, ER: Positive, PR: Positive, HER2: Negative) - Signed by Yan Feng, MD on 03/31/2016 - Pathologic stage from 05/19/2016: Stage IA (pT2(m), pN1a, cM0, G1, ER: Positive, PR: Positive, HER2: Negative) - Signed by Yan Feng, MD on 06/18/2016     Malignant neoplasm of overlapping sites of right breast in female, estrogen receptor positive (HCC)  03/17/2016 Mammogram   B/l diagnostic mammogram and righ US showed a 3.6cm irregular mass in the right breast 11:00 position, posterior depth, there is a enlarged lymph node in the right axilla is highly suspicious for malignancy. additional 7 mm oval mass in the right breast lower outer quadrant is suspicious for malignancy.    03/23/2016 Initial Biopsy   Right breast 9:30 position biopsy showed invasive ductal carcinoma, grade 1. 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    03/23/2016 Receptors her2   Both breast and node biopsy tumor ER 100% positive, PR 70-95% positive, HER-2 negative, Ki-67 40%    03/23/2016 Initial Diagnosis    Malignant neoplasm of upper-outer quadrant of right breast in female, estrogen receptor positive (HCC)    03/25/2016 Initial Biopsy   Right breast 11:00 position core needle biopsy showed invasive duct carcinoma, grade 2.     03/25/2016 Receptors her2   ER 95% positive, PR 90% positive, HER-2 negative, Ki-67 15%    03/25/2016 Miscellaneous   Mammaprint showed low risk type A with index +0.105    03/30/2016 Imaging   Bilateral breast MRI with and without contrast showed a large lobulated enhancing mass within the upper-outer and lower outer right breast with surrounding nodularity, measuring 6.1 x 4.4 x 5.6 cm. Multiple critically sick and right axillary lymph nodes are demonstrated measuring up to 1.5 cm.     04/01/2016 -  Anti-estrogen oral therapy   Exemestane 25 mg daily, plan for 7 years. Switched to Anastrozole 1mg in 06/2017 due to joint pain and hot flashes. Due to persistent side effects I changed her to Tamoxifen in 09/2018. She stopped Tamoxifen and switched back to anastrozole in 01/2019 because it was more tolerable.    05/19/2016 Surgery   Bilateral mastectomy and right axillary regional lymph node resection.    05/19/2016 Pathology Results   -Right axillary regional lymph node resection revealed metastatic carcinoma in 2/7 lymph nodes. -Left simple mastectomy revealed lobular neoplasia and fibrocystic changes with adenosis and calcifications. 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 -Skin of the right mastectomy flap was benign. -mpT2, pN1a    06/25/2016 - 08/11/2016 Radiation Therapy   Site/dose:    1. 4   field Right breast was treated to 50.4 Gy in 25 fractions at 1.8 Gy per fraction. 2. The Right breast was boosted to 10 Gy in 5 fractions at 2 Gy per fraction.     04/15/2017 Genetic Testing   The patient had genetic testing due to a personal history of breast cancer and family history of breast, ovarian, and colon cancer. The Common Hereditary Cancer Panel was ordered.  The Common Hereditary Cancer Panel offered by [facility] includes sequencing and/or deletion duplication testing of the following 47 genes: APC, ATM, AXIN2, BARD1, BMPR1A, BRCA1, BRCA2, BRIP1, CDH1, CDKN2A (p14ARF), CDKN2A (p16INK4a), CKD4, CHEK2, CTNNA1, DICER1, EPCAM (Deletion/duplication testing only), GREM1 (promoter region deletion/duplication testing only), KIT, MEN1, MLH1, MSH2, MSH3, MSH6, MUTYH, NBN, NF1, NHTL1, PALB2, PDGFRA, PMS2, POLD1, POLE, PTEN, RAD50, RAD51C, RAD51D, SDHB, SDHC, SDHD, SMAD4, SMARCA4. STK11, TP53, TSC1, TSC2, and VHL.  The following genes were evaluated for sequence changes only: SDHA and HOXB13 c.251G>A variant only.  Results: Negative, no pathogenic variants identified. The date of this test report is 04/15/2017    06/28/2017 Surgery   COMPLEX REVISION OF BACK SCAR and LEFT BREAST RECONSTRUCTION WITH SILICONE IMPLANT EXCHANGE AND ACELLULARDERMIS TO LEFT CHEST by Dr. Thimmappa 06/28/17       CURRENT THERAPY:  Exemestane 25 mg daily, plan for 7 years. Switched to Anastrozole 1mg in 06/2017 due to joint pain and hot flashes. Due to persistent side effects I changed her to Tamoxifen in 09/2018. She stopped Tamoxifen and switched back to anastrozole in 01/2019 because it was more tolerable.   INTERVAL HISTORY:  [name] is here for a follow up of right breast cancer. She was last seen by me 03/2019 and 09/2019. She presents to the clinic with her husband.  She is back to office for work now, and is concerned about risk of COVID infection, and feels more stressed because of that, she worked from home for past two years.  She is otherwise doing well, still has moderate hot flushes, manageable.  She has intermittent pain and tightness at right axillary area, no  limited ROM of right shoulder.   All other systems were reviewed with the patient and are negative.  MEDICAL HISTORY:  Past Medical History:  Diagnosis Date   Anxiety    Anxiety disorder    Asthma    h/o asthma as a child   Breast cancer (HCC)    Cancer (HCC) 04/2016   right breast cancer   Cholelithiasis    Chronic kidney disease    obstruction of R kidney, ( not a stone) - currently resolved    Depression    Diverticulosis    DJD (degenerative joint disease)    hands & back    Dyspnea    resolved since she stopped smoking    Epigastric abdominal pain    Esophageal stricture    Family history of adverse reaction to anesthesia    daughter has N&V, takes long time to wake up    Family history of colon cancer    Family history of ovarian cancer    Family hx of colon cancer    Female pelvic peritoneal adhesions 10/26/2012   Ganglion cyst    GERD (gastroesophageal reflux disease)    Headache    low grade currently , family history of migraines    Hemorrhoid    History of radiation therapy 05/03 - 08/11/2016   1. 4 field Right breast was treated to 50.4 Gy in 25 fractions at 1.8 Gy   per fraction. 2. The Right breast was boosted to 10 Gy in 5 fractions at 2 Gy per fraction.   Hypothyroidism    Plantar fasciitis, bilateral    PONV (postoperative nausea and vomiting)    gets anxious with the mask on her face, also remarks that the scop. patch has helped in the past       SURGICAL HISTORY: Past Surgical History:  Procedure Laterality Date   ABDOMINAL HYSTERECTOMY     BREAST IMPLANT REMOVAL Bilateral 01/24/2019   Procedure: REMOVAL BREAST IMPLANTS;  Surgeon: Irene Limbo, MD;  Location: [facility];  Service: Plastics;  Laterality: Bilateral;   BREAST RECONSTRUCTION WITH PLACEMENT OF TISSUE EXPANDER AND FLEX HD (ACELLULAR HYDRATED DERMIS) Bilateral 05/19/2016   Procedure: BREAST RECONSTRUCTION WITH PLACEMENT OF TISSUE EXPANDER AND ALLODERM PLACEMENT;  Surgeon:  Irene Limbo, MD;  Location: [place];  Service: Plastics;  Laterality: Bilateral;   BREAST RECONSTRUCTION WITH PLACEMENT OF TISSUE EXPANDER AND FLEX HD (ACELLULAR HYDRATED DERMIS) Left 06/28/2017   Procedure: LEFT BREAST RECONSTRUCTION WITH SILICONE IMPLANT EXCHANGE AND ACELLULARDERMIS TO LEFT CHEST;  Surgeon: Irene Limbo, MD;  Location: [facility];  Service: Plastics;  Laterality: Left;   CAPSULECTOMY Bilateral 01/24/2019   Procedure: CAPSULECTOMY;  Surgeon: Irene Limbo, MD;  Location: [facility];  Service: Plastics;  Laterality: Bilateral;   CESAREAN SECTION  1988   CHOLECYSTECTOMY     HEMORRHOID SURGERY     LAPAROSCOPIC BILATERAL SALPINGECTOMY N/A 10/26/2012   Procedure: operative laparoscopy with lysis of adhesions;  Surgeon: Thornell Sartorius, MD;  Location: [facility];  Service: Gynecology;  Laterality: N/A;   LESION EXCISION WITH COMPLEX REPAIR Right 06/09/2019   Procedure: COMPLEX REPAIR RIGHT CHEST 15CM;  Surgeon: Irene Limbo, MD;  Location: [place];  Service: Plastics;  Laterality: Right;   MASTECTOMY Bilateral    MASTECTOMY WITH RADIOACTIVE SEED GUIDED EXCISION AND AXILLARY SENTINEL LYMPH NODE BIOPSY Bilateral 05/19/2016   Procedure: RIGHT SKIN SPARING MASTECTOMY WITH RIGHT RADIOACTIVE SEED TARGETED DISSECTION AND RIGHT SENTINEL LYMPH NODE BIOPSY, LEFT PROPHYLACTIC SKIN SPARING MASTECTOMY;  Surgeon: Rolm Bookbinder, MD;  Location: [facility];  Service: General;  Laterality: Bilateral;   REMOVAL OF BILATERAL TISSUE EXPANDERS WITH PLACEMENT OF BILATERAL BREAST IMPLANTS Bilateral 01/29/2017   Procedure: REMOVAL OF BILATERAL TISSUE EXPANDERS WITH PLACEMENT OF BILATERAL SILICONE BREAST IMPLANTS, ALLODERM TO LEFT BREAST RECONSTRUCTION;  RIGHT LATISSUMUS FLAP;  Surgeon: Irene Limbo, MD;  Location: [facility];  Service: Plastics;  Laterality: Bilateral;  Requesting RNFA   SCAR REVISION Right 06/28/2017    Procedure: COMPLEX REVISION OF BACK SCAR;  Surgeon: Irene Limbo, MD;  Location: [place];  Service: Plastics;  Laterality: Right;   TONSILLECTOMY     TUBAL LIGATION     urologic surgery for ureteropelvic junction obstruction      I have reviewed the social history and family history with the patient and they are unchanged from previous note.  ALLERGIES:  is allergic to adhesive [tape], promethazine hcl, and sulfa antibiotics.  MEDICATIONS:  Current Outpatient Medications  Medication Sig Dispense Refill   acetaminophen (TYLENOL) 500 MG tablet Take 1,000 mg by mouth every 6 (six) hours as needed.     ALPRAZolam (XANAX) 1 MG tablet Take 1 tablet (1 mg total) by mouth 3 (three) times daily. 90 tablet 0   anastrozole (ARIMIDEX) 1 MG tablet Take 1 tablet (1 mg total) by mouth daily. 90 tablet 3   celecoxib (CELEBREX) 200 MG capsule  Take 1 capsule (200 mg total) by mouth daily. 30 capsule 2   Cetirizine HCl 10 MG CAPS Take 1 capsule (10 mg total) by mouth daily for 10 days. 10 capsule 0   cloNIDine (CATAPRES) 0.1 MG tablet TAKE 1 TABLET BY MOUTH EVERY DAY 90 tablet 1   levothyroxine (SYNTHROID, LEVOTHROID) 50 MCG tablet Take 50 mcg by mouth daily before breakfast.     methocarbamol (ROBAXIN) 500 MG tablet Take 1 tablet (500 mg total) by mouth every 8 (eight) hours as needed for muscle spasms. 20 tablet 0   omeprazole (PRILOSEC) 40 MG capsule TAKE ONE CAPSULE BY MOUTH EVERY DAY 30 capsule 4   venlafaxine XR (EFFEXOR-XR) 150 MG 24 hr capsule TAKE 1 CAPSULE BY MOUTH EVERY DAY WITH BREAKFAST 90 capsule 1   venlafaxine XR (EFFEXOR-XR) 75 MG 24 hr capsule TAKE 1 CAPSULE (75 MG TOTAL) BY MOUTH DAILY WITH BREAKFAST. 90 capsule 2   No current facility-administered medications for this visit.    PHYSICAL EXAMINATION: ECOG PERFORMANCE STATUS: 0 - Asymptomatic  Vitals:   08/20/20 1221  BP: (!) 142/90  Pulse: 92  Resp: 18  Temp: 97.8 F (36.6 C)  SpO2: 96%   Filed Weights    08/20/20 1221  Weight: 166 lb 12.8 oz (75.7 kg)    GENERAL:alert, no distress and comfortable SKIN: skin color, texture, turgor are normal, no rashes or significant lesions EYES: normal, Conjunctiva are pink and non-injected, sclera clear NECK: supple, thyroid normal size, non-tender, without nodularity LYMPH:  no palpable lymphadenopathy in the cervical, axillary  LUNGS: clear to auscultation and percussion with normal breathing effort HEART: regular rate & rhythm and no murmurs and no lower extremity edema ABDOMEN:abdomen soft, non-tender and normal bowel sounds Musculoskeletal:no cyanosis of digits and no clubbing  NEURO: alert & oriented x 3 with fluent speech, no focal motor/sensory deficits Breasts: Status post bilateral mastectomy, incisions healed very well, no palpable nodule on the incision lines, or on chest wall.  No axillary adenopathy.   LABORATORY DATA:  I have reviewed the data as listed CBC Latest Ref Rng & Units 08/20/2020 04/15/2020 10/12/2019  WBC 4.0 - 10.5 K/uL 5.4 4.7 6.9  Hemoglobin 12.0 - 15.0 g/dL 13.5 13.4 13.4  Hematocrit 36.0 - 46.0 % 40.5 41.4 42.6  Platelets 150 - 400 K/uL 260 250 262     CMP Latest Ref Rng & Units 08/20/2020 04/15/2020 10/12/2019  Glucose 70 - 99 mg/dL 100(H) 107(H) 96  BUN 6 - 20 mg/dL _0 Creatinine 0.44 - 1.00 mg/dL 1.03(H) 1.04(H) 0.93  Sodium 135 - 145 mmol/L 141 142 140  Potassium 3.5 - 5.1 mmol/L 4.2 4.2 3.8  Chloride 98 - 111 mmol/L 108 106 104  CO2 22 - 32 mmol/L _1 Calcium 8.9 - 10.3 mg/dL 9.0 8.8(L) 9.8  Total Protein 6.5 - 8.1 g/dL 6.8 7.0 7.0  Total Bilirubin 0.3 - 1.2 mg/dL 0.4 0.4 0.5  Alkaline Phos 38 - 126 U/L 98 104 110  AST 15 - 41 U/L _2 ALT 0 - 44 U/L _3 RADIOGRAPHIC STUDIES: I have personally reviewed the radiological images as listed and agreed with the findings in the report. No results found.   ASSESSMENT & PLAN:  CAYMAN BROGDEN is a 60 y.o. female with   1.  Malignant new overlapping sites of right breast, mpT2 pN1a, stage IA, G1,  ER positive, PR positive, HER-2 negative, mammaprint low  risk luminal type A -Diagnosed in 02/2016. Treated with bilateral mastectomy and reconstruction with implants, and adjuvant radiation. -She started antiestrogen therapy Exemestane on 04/01/16. Due to joint pain and hot flashes Exemestane was switched to Anastrozole in 06/2017. Due to lack of improvement on side effects I switched her to Tamoxifen in 09/2018. She switched back to anastrozole which was more tolerate in 01/2019. Side effects of hot flashes and joint pain more tolerable now, will continue for complete 7 years if tolerable.  -She underwent removal of b/l breast implants by Dr Iran Planas on 01/24/19. She will f/u with Dr Iran Planas as needed. -She is clinically doing well. Lab reviewed, her CBC and CMP are within normal limits. Her physical exam was unremarkable. There is no clinical concern for recurrence -Continue surveillance. She has a b/l mastectomy and does not need mammogram.  -Continue anastrozole -F/u in 6 months    2. Insomnia  -She was previously on Ambien, and attempted to take Benadryl. Her Gabapentin 132m has not helped much and she is not able to tolerate higher dose. -I advised her to discuss with her PCP and increase physical activity and exercise which can help.   3. Hot flashes -Currently on Effexor 225 mg daily. She cannot tolerate Gabapentin over 1065mnightly. Will continue, It helps some.  Hot flashes has improved.     4. Joint pain, arthritis  -Had worsened on AI, She is currently on Anastrozole which is more tolerable. -She is on Celebrex for the pain which is mainly in her back but this causes her to bruise significantly. I recommend she speak with PCP about another medication. -I refilled her Robaxin per her request. -Get refill of hydrocodone at her primary care physician's office   5. H/o of smoking, asthma -she has history of  smoking (1/2 ppd for several years) and asthma for which she use albuterol inhaler as needed  -she will follow up with her PCP.   6. Osteopenia -Her 04/2014 DEXA shows osteopenia with lowest T-score -1.8 at right hip. [facility] (T-score -1.8). Will repeat in 2023. -I recommend Zometa infusion every 6 months for 2 years.  Benefit and side effects discussed with, there is a risk of jaw osteonecrosis.  Is no active dental issue now.  Plan to start today.   Plan  -Lab reviewed, adequate for treatment, plan to start Zometa infusion today, and continue every 6 months -Continue anastrozole -Reviewed Robaxin -lab, Follow-up, and Zometa infusion in 6 months   No problem-specific Assessment & Plan notes found for this encounter.   No orders of the defined types were placed in this encounter.  All questions were answered. The patient knows to call the clinic with any problems, questions or concerns. No barriers to learning was detected. The total time spent in the appointment was 30 minutes.     YaTruitt MerleMD 08/20/2020   I, AmJoslyn Devonam acting as scribe for YaTruitt MerleMD.   I have reviewed the above documentation for accuracy and completeness, and I agree with the above.

## 2020-08-16 ENCOUNTER — Other Ambulatory Visit: Payer: BC Managed Care – PPO

## 2020-08-16 ENCOUNTER — Ambulatory Visit: Payer: BC Managed Care – PPO | Admitting: Hematology

## 2020-08-16 ENCOUNTER — Ambulatory Visit: Payer: BC Managed Care – PPO

## 2020-08-20 ENCOUNTER — Other Ambulatory Visit: Payer: Self-pay

## 2020-08-20 ENCOUNTER — Inpatient Hospital Stay: Payer: BC Managed Care – PPO | Attending: Hematology

## 2020-08-20 ENCOUNTER — Ambulatory Visit: Payer: BC Managed Care – PPO

## 2020-08-20 ENCOUNTER — Inpatient Hospital Stay: Payer: BC Managed Care – PPO | Admitting: Hematology

## 2020-08-20 VITALS — BP 142/90 | HR 92 | Temp 97.8°F | Resp 18 | Ht 66.0 in | Wt 166.8 lb

## 2020-08-20 DIAGNOSIS — Z9079 Acquired absence of other genital organ(s): Secondary | ICD-10-CM | POA: Diagnosis not present

## 2020-08-20 DIAGNOSIS — Z87891 Personal history of nicotine dependence: Secondary | ICD-10-CM | POA: Diagnosis not present

## 2020-08-20 DIAGNOSIS — Z9013 Acquired absence of bilateral breasts and nipples: Secondary | ICD-10-CM | POA: Diagnosis not present

## 2020-08-20 DIAGNOSIS — M8580 Other specified disorders of bone density and structure, unspecified site: Secondary | ICD-10-CM | POA: Insufficient documentation

## 2020-08-20 DIAGNOSIS — J45909 Unspecified asthma, uncomplicated: Secondary | ICD-10-CM | POA: Insufficient documentation

## 2020-08-20 DIAGNOSIS — C50811 Malignant neoplasm of overlapping sites of right female breast: Secondary | ICD-10-CM | POA: Diagnosis not present

## 2020-08-20 DIAGNOSIS — C773 Secondary and unspecified malignant neoplasm of axilla and upper limb lymph nodes: Secondary | ICD-10-CM | POA: Insufficient documentation

## 2020-08-20 DIAGNOSIS — N189 Chronic kidney disease, unspecified: Secondary | ICD-10-CM | POA: Diagnosis not present

## 2020-08-20 DIAGNOSIS — Z79899 Other long term (current) drug therapy: Secondary | ICD-10-CM | POA: Diagnosis not present

## 2020-08-20 DIAGNOSIS — Z170 Estrogen receptor positive status [ER+]: Secondary | ICD-10-CM | POA: Insufficient documentation

## 2020-08-20 DIAGNOSIS — Z923 Personal history of irradiation: Secondary | ICD-10-CM | POA: Insufficient documentation

## 2020-08-20 DIAGNOSIS — E039 Hypothyroidism, unspecified: Secondary | ICD-10-CM | POA: Diagnosis not present

## 2020-08-20 DIAGNOSIS — Z79811 Long term (current) use of aromatase inhibitors: Secondary | ICD-10-CM | POA: Diagnosis not present

## 2020-08-20 DIAGNOSIS — M816 Localized osteoporosis [Lequesne]: Secondary | ICD-10-CM

## 2020-08-20 DIAGNOSIS — Z90710 Acquired absence of both cervix and uterus: Secondary | ICD-10-CM | POA: Insufficient documentation

## 2020-08-20 LAB — CBC WITH DIFFERENTIAL/PLATELET
Abs Immature Granulocytes: 0.01 10*3/uL (ref 0.00–0.07)
Basophils Absolute: 0.1 10*3/uL (ref 0.0–0.1)
Basophils Relative: 2 %
Eosinophils Absolute: 0.2 10*3/uL (ref 0.0–0.5)
Eosinophils Relative: 4 %
HCT: 40.5 % (ref 36.0–46.0)
Hemoglobin: 13.5 g/dL (ref 12.0–15.0)
Immature Granulocytes: 0 %
Lymphocytes Relative: 34 %
Lymphs Abs: 1.8 10*3/uL (ref 0.7–4.0)
MCH: 28.7 pg (ref 26.0–34.0)
MCHC: 33.3 g/dL (ref 30.0–36.0)
MCV: 86 fL (ref 80.0–100.0)
Monocytes Absolute: 0.6 10*3/uL (ref 0.1–1.0)
Monocytes Relative: 11 %
Neutro Abs: 2.6 10*3/uL (ref 1.7–7.7)
Neutrophils Relative %: 49 %
Platelets: 260 10*3/uL (ref 150–400)
RBC: 4.71 MIL/uL (ref 3.87–5.11)
RDW: 14.6 % (ref 11.5–15.5)
WBC: 5.4 10*3/uL (ref 4.0–10.5)
nRBC: 0 % (ref 0.0–0.2)

## 2020-08-20 LAB — COMPREHENSIVE METABOLIC PANEL
ALT: 24 U/L (ref 0–44)
AST: 25 U/L (ref 15–41)
Albumin: 3.7 g/dL (ref 3.5–5.0)
Alkaline Phosphatase: 98 U/L (ref 38–126)
Anion gap: 10 (ref 5–15)
BUN: 18 mg/dL (ref 6–20)
CO2: 23 mmol/L (ref 22–32)
Calcium: 9 mg/dL (ref 8.9–10.3)
Chloride: 108 mmol/L (ref 98–111)
Creatinine, Ser: 1.03 mg/dL — ABNORMAL HIGH (ref 0.44–1.00)
GFR, Estimated: >60 mL/min (ref >60)
Glucose, Bld: 100 mg/dL — ABNORMAL HIGH (ref 70–99)
Potassium: 4.2 mmol/L (ref 3.5–5.1)
Sodium: 141 mmol/L (ref 135–145)
Total Bilirubin: 0.4 mg/dL (ref 0.3–1.2)
Total Protein: 6.8 g/dL (ref 6.5–8.1)

## 2020-08-20 MED ORDER — ZOLEDRONIC ACID 4 MG/100ML IV SOLN
4.0000 mg | Freq: Once | INTRAVENOUS | Status: AC
  Administered 2020-08-20: 4 mg via INTRAVENOUS

## 2020-08-20 MED ORDER — ZOLEDRONIC ACID 4 MG/100ML IV SOLN
INTRAVENOUS | Status: AC
  Filled 2020-08-20: qty 100

## 2020-08-20 MED ORDER — SODIUM CHLORIDE 0.9 % IV SOLN
Freq: Once | INTRAVENOUS | Status: AC
  Filled 2020-08-20: qty 250

## 2020-08-20 MED ORDER — METHOCARBAMOL 500 MG PO TABS: 500.0000 mg | ORAL_TABLET | Freq: Three times a day (TID) | ORAL | 0 refills | Status: DC | PRN

## 2020-08-20 NOTE — Patient Instructions (Signed)

## 2020-09-25 ENCOUNTER — Telehealth: Payer: Self-pay

## 2020-09-25 NOTE — Telephone Encounter (Signed)
This nurse received a message from this patient stating that she is having difficulty with moving her hands. The pain is increased severe that she was unable to work today.  Job consists of a lot of typing.  She would like to know what can be done and if she should see an orthopedic doctor for the arthritis in her hands without causing an issue with her cancer treatments. This nurse informed patient that this information will be passed on to MD.

## 2020-09-25 NOTE — Telephone Encounter (Signed)
This nurse returned call to patient and advised per Dr. Burr Medico she can go to see an orthopedic doctor for the pain in her hands.  Patient asked for a referral to see Dr. Roseanne Kaufman.  This nurse acknowledged.  No further questions or concerns at this time.

## 2020-09-26 ENCOUNTER — Other Ambulatory Visit: Payer: Self-pay

## 2020-09-26 DIAGNOSIS — M79642 Pain in left hand: Secondary | ICD-10-CM

## 2020-09-26 DIAGNOSIS — M79641 Pain in right hand: Secondary | ICD-10-CM

## 2020-09-29 ENCOUNTER — Other Ambulatory Visit: Payer: Self-pay | Admitting: Hematology

## 2020-09-29 DIAGNOSIS — C50811 Malignant neoplasm of overlapping sites of right female breast: Secondary | ICD-10-CM

## 2020-10-11 ENCOUNTER — Encounter: Payer: Self-pay | Admitting: Hematology

## 2020-10-14 ENCOUNTER — Telehealth: Payer: BC Managed Care – PPO | Admitting: Nurse Practitioner

## 2020-10-24 DIAGNOSIS — G5603 Carpal tunnel syndrome, bilateral upper limbs: Secondary | ICD-10-CM | POA: Diagnosis not present

## 2020-10-27 ENCOUNTER — Other Ambulatory Visit: Payer: Self-pay | Admitting: Nurse Practitioner

## 2020-10-27 ENCOUNTER — Other Ambulatory Visit: Payer: Self-pay | Admitting: Hematology

## 2020-10-27 DIAGNOSIS — Z170 Estrogen receptor positive status [ER+]: Secondary | ICD-10-CM

## 2020-10-27 DIAGNOSIS — R232 Flushing: Secondary | ICD-10-CM

## 2020-10-27 DIAGNOSIS — C50811 Malignant neoplasm of overlapping sites of right female breast: Secondary | ICD-10-CM

## 2020-10-29 ENCOUNTER — Encounter: Payer: Self-pay | Admitting: Hematology

## 2020-10-29 ENCOUNTER — Other Ambulatory Visit: Payer: Self-pay | Admitting: Hematology

## 2020-10-29 DIAGNOSIS — C50811 Malignant neoplasm of overlapping sites of right female breast: Secondary | ICD-10-CM

## 2020-10-29 NOTE — Telephone Encounter (Signed)
Never prescribed

## 2020-11-19 ENCOUNTER — Encounter: Payer: Self-pay | Admitting: Internal Medicine

## 2021-01-27 ENCOUNTER — Telehealth: Payer: Self-pay | Admitting: Hematology

## 2021-01-27 ENCOUNTER — Telehealth: Payer: Self-pay

## 2021-01-27 NOTE — Telephone Encounter (Signed)
Rescheduled upcoming appointment due to over-scheduled provider. Patient is aware of changes.

## 2021-01-27 NOTE — Telephone Encounter (Signed)
Pt had appts rescheduled for 02/13/2021 since he's having surgery on her hand around 02/19/2021.

## 2021-02-05 DIAGNOSIS — G5603 Carpal tunnel syndrome, bilateral upper limbs: Secondary | ICD-10-CM | POA: Diagnosis not present

## 2021-02-13 ENCOUNTER — Other Ambulatory Visit: Payer: Self-pay

## 2021-02-13 ENCOUNTER — Inpatient Hospital Stay: Payer: BC Managed Care – PPO

## 2021-02-13 ENCOUNTER — Inpatient Hospital Stay: Payer: BC Managed Care – PPO | Admitting: Hematology

## 2021-02-13 ENCOUNTER — Inpatient Hospital Stay: Payer: BC Managed Care – PPO | Attending: Hematology

## 2021-02-13 ENCOUNTER — Encounter: Payer: Self-pay | Admitting: Hematology

## 2021-02-13 VITALS — BP 128/71 | HR 82 | Temp 98.6°F | Resp 18 | Ht 66.0 in | Wt 172.6 lb

## 2021-02-13 DIAGNOSIS — Z87891 Personal history of nicotine dependence: Secondary | ICD-10-CM | POA: Diagnosis not present

## 2021-02-13 DIAGNOSIS — M8580 Other specified disorders of bone density and structure, unspecified site: Secondary | ICD-10-CM | POA: Diagnosis not present

## 2021-02-13 DIAGNOSIS — Z170 Estrogen receptor positive status [ER+]: Secondary | ICD-10-CM

## 2021-02-13 DIAGNOSIS — C50811 Malignant neoplasm of overlapping sites of right female breast: Secondary | ICD-10-CM

## 2021-02-13 DIAGNOSIS — Z9013 Acquired absence of bilateral breasts and nipples: Secondary | ICD-10-CM | POA: Insufficient documentation

## 2021-02-13 DIAGNOSIS — Z79899 Other long term (current) drug therapy: Secondary | ICD-10-CM | POA: Diagnosis not present

## 2021-02-13 DIAGNOSIS — Z79811 Long term (current) use of aromatase inhibitors: Secondary | ICD-10-CM | POA: Insufficient documentation

## 2021-02-13 DIAGNOSIS — M816 Localized osteoporosis [Lequesne]: Secondary | ICD-10-CM

## 2021-02-13 DIAGNOSIS — J45909 Unspecified asthma, uncomplicated: Secondary | ICD-10-CM | POA: Insufficient documentation

## 2021-02-13 DIAGNOSIS — C773 Secondary and unspecified malignant neoplasm of axilla and upper limb lymph nodes: Secondary | ICD-10-CM | POA: Diagnosis not present

## 2021-02-13 DIAGNOSIS — Z923 Personal history of irradiation: Secondary | ICD-10-CM | POA: Diagnosis not present

## 2021-02-13 LAB — CBC WITH DIFFERENTIAL/PLATELET
Abs Immature Granulocytes: 0 10*3/uL (ref 0.00–0.07)
Basophils Absolute: 0.1 10*3/uL (ref 0.0–0.1)
Basophils Relative: 1 %
Eosinophils Absolute: 0.2 10*3/uL (ref 0.0–0.5)
Eosinophils Relative: 3 %
HCT: 40.1 % (ref 36.0–46.0)
Hemoglobin: 13.1 g/dL (ref 12.0–15.0)
Immature Granulocytes: 0 %
Lymphocytes Relative: 41 %
Lymphs Abs: 2.2 10*3/uL (ref 0.7–4.0)
MCH: 29.1 pg (ref 26.0–34.0)
MCHC: 32.7 g/dL (ref 30.0–36.0)
MCV: 89.1 fL (ref 80.0–100.0)
Monocytes Absolute: 0.5 10*3/uL (ref 0.1–1.0)
Monocytes Relative: 9 %
Neutro Abs: 2.4 10*3/uL (ref 1.7–7.7)
Neutrophils Relative %: 46 %
Platelets: 239 10*3/uL (ref 150–400)
RBC: 4.5 MIL/uL (ref 3.87–5.11)
RDW: 14.5 % (ref 11.5–15.5)
WBC: 5.2 10*3/uL (ref 4.0–10.5)
nRBC: 0 % (ref 0.0–0.2)

## 2021-02-13 LAB — COMPREHENSIVE METABOLIC PANEL
ALT: 24 U/L (ref 0–44)
AST: 25 U/L (ref 15–41)
Albumin: 4.2 g/dL (ref 3.5–5.0)
Alkaline Phosphatase: 79 U/L (ref 38–126)
Anion gap: 10 (ref 5–15)
BUN: 15 mg/dL (ref 6–20)
CO2: 23 mmol/L (ref 22–32)
Calcium: 8.9 mg/dL (ref 8.9–10.3)
Chloride: 106 mmol/L (ref 98–111)
Creatinine, Ser: 0.87 mg/dL (ref 0.44–1.00)
GFR, Estimated: >60 mL/min (ref >60)
Glucose, Bld: 106 mg/dL — ABNORMAL HIGH (ref 70–99)
Potassium: 3.9 mmol/L (ref 3.5–5.1)
Sodium: 139 mmol/L (ref 135–145)
Total Bilirubin: 0.4 mg/dL (ref 0.3–1.2)
Total Protein: 6.5 g/dL (ref 6.5–8.1)

## 2021-02-13 LAB — VITAMIN D 25 HYDROXY (VIT D DEFICIENCY, FRACTURES): Vit D, 25-Hydroxy: 57.89 ng/mL (ref 30–100)

## 2021-02-13 MED ORDER — ZOLEDRONIC ACID 4 MG/100ML IV SOLN
4.0000 mg | Freq: Once | INTRAVENOUS | Status: AC
  Administered 2021-02-13: 14:00:00 4 mg via INTRAVENOUS
  Filled 2021-02-13: qty 100

## 2021-02-13 MED ORDER — SODIUM CHLORIDE 0.9 % IV SOLN: Freq: Once | INTRAVENOUS | Status: AC

## 2021-02-13 NOTE — Progress Notes (Signed)
[place]   [phone] [phone]   Clinic Follow up Note   Patient Care Team: Holland Commons, FNP as PCP - General (Internal Medicine) [place], Laurice Record, MD (Inactive) (Orthopedic Surgery) Rutherford Guys, MD (Ophthalmology) Lavonna Monarch, MD (Dermatology) Truitt Merle, MD as Consulting Physician (Hematology) Rolm Bookbinder, MD as Consulting Physician (General Surgery) Kyung Rudd, MD as Consulting Physician (Radiation Oncology) Gardenia Phlegm, NP as Nurse Practitioner (Hematology and Oncology)  Date of Service:  02/13/2021  CHIEF COMPLAINT: f/u of right breast cancer  CURRENT THERAPY:  Exemestane 25 mg daily, started 03/2016. Switched to Anastrozole 63m in 06/2017 due to joint pain and hot flashes. Due to persistent side effects I changed her to Tamoxifen in 09/2018. She stopped Tamoxifen and switched back to anastrozole in 01/2019 because it was more tolerable.   ASSESSMENT & PLAN:  Katie AVISis a 60y.o. female with   1. 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 Treated with bilateral mastectomy and reconstruction with implants, and adjuvant radiation. -She underwent removal of b/l breast implants by Dr TIran Planason 01/24/19. She will f/u with Dr TIran Planasas needed. -She started antiestrogen therapy Exemestane on 04/01/16, plan for 7 years. Due to joint pain and hot flashes Exemestane was switched to Anastrozole in 06/2017. Due to lack of improvement on side effects I switched her to Tamoxifen in 09/2018. She switched back to anastrozole which was more tolerate in 01/2019.  -given her continued difficulty with joint pain, as well as her low risk mammaprint, I discussed discontinuing antiestrogen therapy. I recommend she take it through 03/2021 to complete 5 years. She is agreeable and looking forward  to coming off the medicine. I'm hopeful her arthritis will improve. -She is clinically doing well. Lab reviewed, her CBC and CMP are within normal limits. Her physical exam was unremarkable. There is no clinical concern for recurrence -Continue surveillance. She has a b/l mastectomy and does not need mammogram.  -Continue anastrozole through 03/2021 -F/u in 6 months    2. Hot flashes -Currently on Effexor 225 mg daily. She cannot tolerate Gabapentin over 103mnightly. Will continue, It helps some.    3. Joint pain, arthritis, R carpal tunnel -Had worsened on AI, She is currently on Anastrozole which is more tolerable. -she has had worsening right hand pain and was found to have carpal tunnel. She is followed by orthopedics and is considering surgery.   4. H/o of smoking, asthma -she has history of smoking (1/2 ppd for several years) and asthma for which she use albuterol inhaler as needed  -she will follow up with her PCP.   5. Osteopenia -Her 04/2014 DEXA shows osteopenia with lowest T-score -1.8 at right hip. [facility] Will repeat in 2023. -she began Zometa infusions on 08/20/20. She reports bone pain for several days after, otherwise tolerated well. -she reports she is taking vit D, and I recommended adding calcium.    Plan  -proceed with Zometa infusion today, and every 6 months for 2 more doses  -Continue anastrozole through 03/2021 -I will fax her lab results to her PCP, per her request. -lab, Follow-up, and Zometa infusion in 6 months   No problem-specific Assessment & Plan notes found for this encounter.   SUMMARY OF ONCOLOGIC HISTORY: Oncology History Overview Note  Cancer Staging Malignant neoplasm of overlapping sites of right breast in female, estrogen receptor positive ([place]) Staging form: Breast, AJCC 8th Edition - Clinical stage from 03/23/2016: Stage IIA (cT3, cN1, cM0, G2, ER: Positive,  PR: Positive, HER2: Negative) - Signed by Truitt Merle, MD on 03/31/2016 - Pathologic stage from 05/19/2016: Stage IA (pT2(m), pN1a, cM0, G1, ER: Positive, PR: Positive, HER2: Negative) - Signed by Truitt Merle, MD on 06/18/2016     Malignant neoplasm of overlapping sites of right breast in female, estrogen receptor positive ([place])  03/17/2016 Mammogram   B/l diagnostic mammogram and righ US showed a 3.6cm irregular mass in the right breast 11:00 position, posterior depth, there is a enlarged lymph node in the right axilla is highly suspicious for malignancy. additional 7 mm oval mass in the right breast lower outer quadrant is suspicious for malignancy.   03/23/2016 Initial Biopsy   Right breast 9:30 position biopsy showed invasive ductal carcinoma, grade 1. 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   03/23/2016 Receptors her2   Both breast and node biopsy tumor ER 100% positive, PR 70-95% positive, HER-2 negative, Ki-67 40%   03/23/2016 Initial Diagnosis   Malignant neoplasm of upper-outer quadrant of right breast in female, estrogen receptor positive ([place])   03/25/2016 Initial Biopsy   Right breast 11:00 position core needle biopsy showed invasive duct carcinoma, grade 2.    03/25/2016 Receptors her2   ER 95% positive, PR 90% positive, HER-2 negative, Ki-67 15%   03/25/2016 Miscellaneous   Mammaprint showed low risk type A with index +0.105   03/30/2016 Imaging   Bilateral breast MRI with and without contrast showed a large lobulated enhancing mass within the upper-outer and lower outer right breast with surrounding nodularity, measuring 6.1 x 4.4 x 5.6 cm. Multiple critically sick and right axillary lymph nodes are demonstrated measuring up to 1.5 cm.    04/01/2016 -  Anti-estrogen oral therapy   Exemestane 25 mg daily, plan for 7 years. Switched to Anastrozole 74m in 06/2017 due to joint pain and hot flashes. Due to persistent side effects I changed her to Tamoxifen in 09/2018.  She stopped Tamoxifen and switched back to anastrozole in 01/2019 because it was more tolerable.    05/19/2016 Surgery   Bilateral mastectomy and right axillary regional lymph node resection.   05/19/2016 Pathology Results   -Right axillary regional lymph node resection revealed metastatic carcinoma in 2/7 lymph nodes. -Left simple mastectomy revealed lobular neoplasia and fibrocystic changes with adenosis and calcifications. 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 -Skin of the right mastectomy flap was benign. -mpT2, pN1a   06/25/2016 - 08/11/2016 Radiation Therapy   Site/dose:    1. 4 field Right breast was treated to 50.4 Gy in 25 fractions at 1.8 Gy per fraction. 2. The Right breast was boosted to 10 Gy in 5 fractions at 2 Gy per fraction.   04/15/2017 Genetic Testing   The patient had genetic testing due to a personal history of breast cancer and family history of breast, ovarian, and colon cancer. The Common Hereditary Cancer Panel was ordered.  The Common Hereditary Cancer Panel offered by [facility] includes sequencing and/or deletion duplication testing of the following 47 genes: APC, ATM, AXIN2, BARD1, BMPR1A, BRCA1, BRCA2, BRIP1, CDH1, CDKN2A (p14ARF), CDKN2A (p16INK4a), CKD4, CHEK2, CTNNA1, DICER1, EPCAM (Deletion/duplication testing only), GREM1 (promoter  region deletion/duplication testing only), KIT, MEN1, MLH1, MSH2, MSH3, MSH6, MUTYH, NBN, NF1, NHTL1, PALB2, PDGFRA, PMS2, POLD1, POLE, PTEN, RAD50, RAD51C, RAD51D, SDHB, SDHC, SDHD, SMAD4, SMARCA4. STK11, TP53, TSC1, TSC2, and VHL.  The following genes were evaluated for sequence changes only: SDHA and HOXB13 c.251G>A variant only.  Results: Negative, no pathogenic variants identified. The date of this test report is 04/15/2017    06/28/2017 Surgery   COMPLEX REVISION OF BACK SCAR and LEFT BREAST RECONSTRUCTION WITH SILICONE IMPLANT EXCHANGE AND ACELLULARDERMIS TO LEFT CHEST by Dr. Iran Planas 06/28/17      INTERVAL HISTORY:  [name] is here for a follow up of breast cancer. She was last seen by me on 08/20/20. She presents to the clinic alone. She reports she is having worsening arthritis in her hands. She adds she is being followed by orthopedics for this. She notes she was given steroid injections with no relief. She also reports dry mouth from the anastrozole.   All other systems were reviewed with the patient and are negative.  MEDICAL HISTORY:  Past Medical History:  Diagnosis Date   Anxiety    Anxiety disorder    Asthma    h/o asthma as a child   Breast cancer ([place])    Cancer ([place]) 04/2016   right breast cancer   Cholelithiasis    Chronic kidney disease    obstruction of R kidney, ( not a stone) - currently resolved    Depression    Diverticulosis    DJD (degenerative joint disease)    hands & back    Dyspnea    resolved since she stopped smoking    Epigastric abdominal pain    Esophageal stricture    Family history of adverse reaction to anesthesia    daughter has N&V, takes long time to wake up    Family history of colon cancer    Family history of ovarian cancer    Family hx of colon cancer    Female pelvic peritoneal adhesions 10/26/2012   Ganglion cyst    GERD (gastroesophageal reflux disease)    Headache    low grade currently , family history of migraines    Hemorrhoid    History of radiation therapy 05/03 - 08/11/2016   1. 4 field Right breast was treated to 50.4 Gy in 25 fractions at 1.8 Gy per fraction. 2. The Right breast was boosted to 10 Gy in 5 fractions at 2 Gy per fraction.   Hypothyroidism    Plantar fasciitis, bilateral    PONV (postoperative nausea and vomiting)    gets anxious with the mask on her face, also remarks that the scop. patch has helped in the past        SURGICAL HISTORY: Past Surgical History:  Procedure Laterality Date   ABDOMINAL HYSTERECTOMY     BREAST IMPLANT REMOVAL Bilateral 01/24/2019   Procedure: REMOVAL BREAST IMPLANTS;  Surgeon: Irene Limbo, MD;  Location: [facility];  Service: Plastics;  Laterality: Bilateral;   BREAST RECONSTRUCTION WITH PLACEMENT OF TISSUE EXPANDER AND FLEX HD (ACELLULAR HYDRATED DERMIS) Bilateral 05/19/2016   Procedure: BREAST RECONSTRUCTION WITH PLACEMENT OF TISSUE EXPANDER AND ALLODERM PLACEMENT;  Surgeon: Irene Limbo, MD;  Location: [facility];  Service: Plastics;  Laterality: Bilateral;   BREAST RECONSTRUCTION WITH PLACEMENT OF TISSUE EXPANDER AND FLEX HD (ACELLULAR HYDRATED DERMIS) Left 06/28/2017   Procedure: LEFT BREAST RECONSTRUCTION WITH SILICONE IMPLANT EXCHANGE AND ACELLULARDERMIS TO LEFT CHEST;  Surgeon: Irene Limbo, MD;  Location:  [facility];  Service: Plastics;  Laterality: Left;   CAPSULECTOMY Bilateral 01/24/2019   Procedure: CAPSULECTOMY;  Surgeon: Irene Limbo, MD;  Location: [facility];  Service: Plastics;  Laterality: Bilateral;   CESAREAN SECTION  1988   CHOLECYSTECTOMY     HEMORRHOID SURGERY     LAPAROSCOPIC BILATERAL SALPINGECTOMY N/A 10/26/2012   Procedure: operative laparoscopy with lysis of adhesions;  Surgeon: Thornell Sartorius, MD;  Location: [facility];  Service: Gynecology;  Laterality: N/A;   LESION EXCISION WITH COMPLEX REPAIR Right 06/09/2019   Procedure: COMPLEX REPAIR RIGHT CHEST 15CM;  Surgeon: Irene Limbo, MD;  Location: [place];  Service: Plastics;  Laterality: Right;   MASTECTOMY Bilateral    MASTECTOMY WITH RADIOACTIVE SEED GUIDED EXCISION AND AXILLARY SENTINEL LYMPH NODE BIOPSY Bilateral 05/19/2016   Procedure: RIGHT SKIN SPARING MASTECTOMY WITH RIGHT RADIOACTIVE SEED TARGETED DISSECTION AND RIGHT SENTINEL LYMPH NODE BIOPSY, LEFT PROPHYLACTIC SKIN SPARING MASTECTOMY;  Surgeon:  Rolm Bookbinder, MD;  Location: [place];  Service: General;  Laterality: Bilateral;   REMOVAL OF BILATERAL TISSUE EXPANDERS WITH PLACEMENT OF BILATERAL BREAST IMPLANTS Bilateral 01/29/2017   Procedure: REMOVAL OF BILATERAL TISSUE EXPANDERS WITH PLACEMENT OF BILATERAL SILICONE BREAST IMPLANTS, ALLODERM TO LEFT BREAST RECONSTRUCTION;  RIGHT LATISSUMUS FLAP;  Surgeon: Irene Limbo, MD;  Location: [place];  Service: Plastics;  Laterality: Bilateral;  Requesting RNFA   SCAR REVISION Right 06/28/2017   Procedure: COMPLEX REVISION OF BACK SCAR;  Surgeon: Irene Limbo, MD;  Location: [place];  Service: Plastics;  Laterality: Right;   TONSILLECTOMY     TUBAL LIGATION     urologic surgery for ureteropelvic junction obstruction      I have reviewed the social history and family history with the patient and they are unchanged from previous note.  ALLERGIES:  is allergic to adhesive [tape], promethazine hcl, and sulfa antibiotics.  MEDICATIONS:  Current Outpatient Medications  Medication Sig Dispense Refill   acetaminophen (TYLENOL) 500 MG tablet Take 1,000 mg by mouth every 6 (six) hours as needed.     ALPRAZolam (XANAX) 1 MG tablet Take 1 tablet (1 mg total) by mouth 3 (three) times daily. 90 tablet 0   anastrozole (ARIMIDEX) 1 MG tablet Take 1 tablet (1 mg total) by mouth daily. 90 tablet 3   celecoxib (CELEBREX) 200 MG capsule Take 1 capsule (200 mg total) by mouth daily. 30 capsule 2   cloNIDine (CATAPRES) 0.1 MG tablet TAKE 1 TABLET BY MOUTH EVERY DAY 90 tablet 1   levothyroxine (SYNTHROID, LEVOTHROID) 50 MCG tablet Take 50 mcg by mouth daily before breakfast.     methocarbamol (ROBAXIN) 500 MG tablet Take 1 tablet (500 mg total) by mouth every 8 (eight) hours as needed for muscle spasms. 20 tablet 0   omeprazole (PRILOSEC) 40 MG capsule TAKE ONE CAPSULE BY MOUTH EVERY DAY 30 capsule 4   venlafaxine XR (EFFEXOR-XR) 150 MG 24 hr capsule TAKE 1 CAPSULE BY  MOUTH EVERY DAY WITH BREAKFAST 90 capsule 1   venlafaxine XR (EFFEXOR-XR) 75 MG 24 hr capsule TAKE 1 CAPSULE (75 MG TOTAL) BY MOUTH DAILY WITH BREAKFAST. 90 capsule 2   No current facility-administered medications for this visit.    PHYSICAL EXAMINATION: ECOG PERFORMANCE STATUS: 1 - Symptomatic but completely ambulatory  Vitals:   02/13/21 1237  BP: 128/71  Pulse: 82  Resp: 18  Temp: 98.6 F (37 C)  SpO2: 100%   Wt Readings from Last 3 Encounters:  02/13/21 172  lb 9.6 oz (78.3 kg)  08/20/20 166 lb 12.8 oz (75.7 kg)  04/15/20 164 lb (74.4 kg)     GENERAL:alert, no distress and comfortable SKIN: skin color, texture, turgor are normal, no rashes or significant lesions EYES: normal, Conjunctiva are pink and non-injected, sclera clear  NECK: supple, thyroid normal size, non-tender, without nodularity LYMPH:  no palpable lymphadenopathy in the cervical, axillary  LUNGS: clear to auscultation and percussion with normal breathing effort HEART: regular rate & rhythm and no murmurs and no lower extremity edema ABDOMEN:abdomen soft, non-tender and normal bowel sounds Musculoskeletal:no cyanosis of digits and no clubbing  NEURO: alert & oriented x 3 with fluent speech, no focal motor/sensory deficits BREAST: Status post bilateral mastectomy, no palpable mass, nodules or adenopathy bilaterally. Breast exam benign.   LABORATORY DATA:  I have reviewed the data as listed CBC Latest Ref Rng & Units 02/13/2021 08/20/2020 04/15/2020  WBC 4.0 - 10.5 K/uL 5.2 5.4 4.7  Hemoglobin 12.0 - 15.0 g/dL 13.1 13.5 13.4  Hematocrit 36.0 - 46.0 % 40.1 40.5 41.4  Platelets 150 - 400 K/uL 239 260 250     CMP Latest Ref Rng & Units 02/13/2021 08/20/2020 04/15/2020  Glucose 70 - 99 mg/dL 106(H) 100(H) 107(H)  BUN 6 - 20 mg/dL '15 18 15  ' Creatinine 0.44 - 1.00 mg/dL 0.87 1.03(H) 1.04(H)  Sodium 135 - 145 mmol/L 139 141 142  Potassium 3.5 - 5.1 mmol/L 3.9 4.2 4.2  Chloride 98 - 111 mmol/L 106 108 106  CO2  22 - 32 mmol/L '23 23 25  ' Calcium 8.9 - 10.3 mg/dL 8.9 9.0 8.8(L)  Total Protein 6.5 - 8.1 g/dL 6.5 6.8 7.0  Total Bilirubin 0.3 - 1.2 mg/dL 0.4 0.4 0.4  Alkaline Phos 38 - 126 U/L 79 98 104  AST 15 - 41 U/L '25 25 25  ' ALT 0 - 44 U/L '24 24 25      ' RADIOGRAPHIC STUDIES: I have personally reviewed the radiological images as listed and agreed with the findings in the report. No results found.    No orders of the defined types were placed in this encounter.  All questions were answered. The patient knows to call the clinic with any problems, questions or concerns. No barriers to learning was detected. The total time spent in the appointment was 30 minutes.     Truitt Merle, MD 02/13/2021   I, Wilburn Mylar, am acting as scribe for Truitt Merle, MD.   I have reviewed the above documentation for accuracy and completeness, and I agree with the above.

## 2021-02-13 NOTE — Patient Instructions (Signed)

## 2021-02-19 ENCOUNTER — Inpatient Hospital Stay: Payer: BC Managed Care – PPO | Admitting: Hematology

## 2021-02-19 ENCOUNTER — Inpatient Hospital Stay: Payer: BC Managed Care – PPO

## 2021-02-20 DIAGNOSIS — Z853 Personal history of malignant neoplasm of breast: Secondary | ICD-10-CM | POA: Diagnosis not present

## 2021-02-20 DIAGNOSIS — G5603 Carpal tunnel syndrome, bilateral upper limbs: Secondary | ICD-10-CM | POA: Diagnosis not present

## 2021-02-20 DIAGNOSIS — Z9221 Personal history of antineoplastic chemotherapy: Secondary | ICD-10-CM | POA: Diagnosis not present

## 2021-02-25 ENCOUNTER — Telehealth: Payer: Self-pay | Admitting: Dermatology

## 2021-02-28 ENCOUNTER — Telehealth: Payer: Self-pay | Admitting: Hematology

## 2021-02-28 NOTE — Telephone Encounter (Signed)
Sch per 12/22 los, left msg

## 2021-03-14 ENCOUNTER — Other Ambulatory Visit: Payer: Self-pay | Admitting: Hematology

## 2021-03-14 DIAGNOSIS — Z170 Estrogen receptor positive status [ER+]: Secondary | ICD-10-CM

## 2021-03-27 DIAGNOSIS — G5603 Carpal tunnel syndrome, bilateral upper limbs: Secondary | ICD-10-CM | POA: Diagnosis not present

## 2021-03-27 DIAGNOSIS — E079 Disorder of thyroid, unspecified: Secondary | ICD-10-CM | POA: Diagnosis not present

## 2021-03-31 ENCOUNTER — Encounter: Payer: Self-pay | Admitting: Hematology

## 2021-03-31 DIAGNOSIS — J3489 Other specified disorders of nose and nasal sinuses: Secondary | ICD-10-CM | POA: Diagnosis not present

## 2021-03-31 DIAGNOSIS — F5102 Adjustment insomnia: Secondary | ICD-10-CM | POA: Diagnosis not present

## 2021-03-31 DIAGNOSIS — F418 Other specified anxiety disorders: Secondary | ICD-10-CM | POA: Diagnosis not present

## 2021-04-17 DIAGNOSIS — F5101 Primary insomnia: Secondary | ICD-10-CM | POA: Diagnosis not present

## 2021-04-17 DIAGNOSIS — M8580 Other specified disorders of bone density and structure, unspecified site: Secondary | ICD-10-CM | POA: Diagnosis not present

## 2021-04-17 DIAGNOSIS — F419 Anxiety disorder, unspecified: Secondary | ICD-10-CM | POA: Diagnosis not present

## 2021-04-28 DIAGNOSIS — J45909 Unspecified asthma, uncomplicated: Secondary | ICD-10-CM | POA: Diagnosis not present

## 2021-05-03 ENCOUNTER — Other Ambulatory Visit: Payer: Self-pay | Admitting: Nurse Practitioner

## 2021-05-03 DIAGNOSIS — R232 Flushing: Secondary | ICD-10-CM

## 2021-05-06 ENCOUNTER — Other Ambulatory Visit: Payer: Self-pay | Admitting: Hematology

## 2021-05-06 DIAGNOSIS — C50811 Malignant neoplasm of overlapping sites of right female breast: Secondary | ICD-10-CM

## 2021-05-23 ENCOUNTER — Ambulatory Visit: Payer: Self-pay | Admitting: Neurology

## 2021-06-25 NOTE — Telephone Encounter (Signed)
x

## 2021-06-26 DIAGNOSIS — Z0000 Encounter for general adult medical examination without abnormal findings: Secondary | ICD-10-CM | POA: Diagnosis not present

## 2021-06-26 DIAGNOSIS — R7989 Other specified abnormal findings of blood chemistry: Secondary | ICD-10-CM | POA: Diagnosis not present

## 2021-06-26 DIAGNOSIS — M8589 Other specified disorders of bone density and structure, multiple sites: Secondary | ICD-10-CM | POA: Diagnosis not present

## 2021-07-03 DIAGNOSIS — S50862A Insect bite (nonvenomous) of left forearm, initial encounter: Secondary | ICD-10-CM | POA: Diagnosis not present

## 2021-07-03 DIAGNOSIS — Z0000 Encounter for general adult medical examination without abnormal findings: Secondary | ICD-10-CM | POA: Diagnosis not present

## 2021-07-04 ENCOUNTER — Other Ambulatory Visit: Payer: Self-pay | Admitting: Registered Nurse

## 2021-07-04 DIAGNOSIS — N949 Unspecified condition associated with female genital organs and menstrual cycle: Secondary | ICD-10-CM

## 2021-07-04 DIAGNOSIS — C50919 Malignant neoplasm of unspecified site of unspecified female breast: Secondary | ICD-10-CM

## 2021-07-04 DIAGNOSIS — Z8249 Family history of ischemic heart disease and other diseases of the circulatory system: Secondary | ICD-10-CM

## 2021-08-01 ENCOUNTER — Other Ambulatory Visit: Payer: Self-pay | Admitting: Nurse Practitioner

## 2021-08-01 DIAGNOSIS — Z170 Estrogen receptor positive status [ER+]: Secondary | ICD-10-CM

## 2021-08-04 NOTE — Telephone Encounter (Signed)
Per Dr. Ernestina Penna last office note, pt is to stop taking the Anastrozole in February 2023

## 2021-08-11 ENCOUNTER — Ambulatory Visit
Admission: RE | Admit: 2021-08-11 | Discharge: 2021-08-11 | Disposition: A | Discharge status: Home / Self Care | Payer: BC Managed Care – PPO | Source: Ambulatory Visit | Attending: Registered Nurse | Admitting: Registered Nurse

## 2021-08-11 ENCOUNTER — Encounter: Payer: Self-pay | Admitting: Hematology

## 2021-08-11 ENCOUNTER — Ambulatory Visit
Admission: RE | Admit: 2021-08-11 | Discharge: 2021-08-11 | Disposition: A | Discharge status: Home / Self Care | Payer: No Typology Code available for payment source | Source: Ambulatory Visit | Attending: Registered Nurse | Admitting: Registered Nurse

## 2021-08-11 DIAGNOSIS — E7800 Pure hypercholesterolemia, unspecified: Secondary | ICD-10-CM | POA: Diagnosis not present

## 2021-08-11 DIAGNOSIS — Z90710 Acquired absence of both cervix and uterus: Secondary | ICD-10-CM | POA: Diagnosis not present

## 2021-08-11 DIAGNOSIS — N949 Unspecified condition associated with female genital organs and menstrual cycle: Secondary | ICD-10-CM

## 2021-08-11 DIAGNOSIS — R1909 Other intra-abdominal and pelvic swelling, mass and lump: Secondary | ICD-10-CM | POA: Diagnosis not present

## 2021-08-11 DIAGNOSIS — C50919 Malignant neoplasm of unspecified site of unspecified female breast: Secondary | ICD-10-CM

## 2021-08-11 DIAGNOSIS — Z8249 Family history of ischemic heart disease and other diseases of the circulatory system: Secondary | ICD-10-CM

## 2021-08-11 DIAGNOSIS — Z853 Personal history of malignant neoplasm of breast: Secondary | ICD-10-CM | POA: Diagnosis not present

## 2021-08-12 ENCOUNTER — Telehealth: Payer: Self-pay | Admitting: Hematology

## 2021-08-12 NOTE — Telephone Encounter (Signed)
Rescheduled upcoming appointment per provider's request. Patient is aware of changes. 

## 2021-08-14 ENCOUNTER — Ambulatory Visit: Payer: BC Managed Care – PPO

## 2021-08-14 ENCOUNTER — Ambulatory Visit: Payer: BC Managed Care – PPO | Admitting: Hematology

## 2021-08-14 ENCOUNTER — Other Ambulatory Visit: Payer: BC Managed Care – PPO

## 2021-08-15 ENCOUNTER — Other Ambulatory Visit: Payer: BC Managed Care – PPO

## 2021-08-19 ENCOUNTER — Inpatient Hospital Stay: Payer: BC Managed Care – PPO | Admitting: Hematology

## 2021-08-19 ENCOUNTER — Inpatient Hospital Stay: Payer: BC Managed Care – PPO

## 2021-08-19 ENCOUNTER — Telehealth: Payer: Self-pay | Admitting: Hematology

## 2021-08-19 ENCOUNTER — Telehealth: Payer: Self-pay

## 2021-08-19 DIAGNOSIS — C50811 Malignant neoplasm of overlapping sites of right female breast: Secondary | ICD-10-CM

## 2021-08-19 NOTE — Telephone Encounter (Signed)
Spoke w/via telephone regarding her appts today.  Pt did not show for her appts.  Pt stated she was called last week by someone at the [facility] stating that Dr. Mosetta Putt would be out of the office and needs to reschedule her appts.  Pt stated she said "OK" and asked to be scheduled on 09/18/2021 d/t she will have to take time off work.  Unfortunately, pt was scheduled on 08/19/2021.  Pt stated she was getting notification reminders from MyChart regarding her appts but thought it was a mistake.  Notified scheduling to please correct appts from 08/19/2021 to 09/18/2021.

## 2021-08-19 NOTE — Telephone Encounter (Signed)
Rescheduled today's appointment per patient's request. Patient is aware of changes. 

## 2021-08-28 DIAGNOSIS — R918 Other nonspecific abnormal finding of lung field: Secondary | ICD-10-CM | POA: Diagnosis not present

## 2021-08-28 DIAGNOSIS — G44209 Tension-type headache, unspecified, not intractable: Secondary | ICD-10-CM | POA: Diagnosis not present

## 2021-09-18 ENCOUNTER — Other Ambulatory Visit: Payer: Self-pay

## 2021-09-18 ENCOUNTER — Inpatient Hospital Stay: Payer: BC Managed Care – PPO | Attending: Hematology

## 2021-09-18 ENCOUNTER — Inpatient Hospital Stay: Payer: BC Managed Care – PPO

## 2021-09-18 ENCOUNTER — Inpatient Hospital Stay: Payer: BC Managed Care – PPO | Admitting: Hematology

## 2021-09-18 ENCOUNTER — Encounter: Payer: Self-pay | Admitting: Hematology

## 2021-09-18 VITALS — BP 136/89 | HR 87 | Temp 98.4°F | Resp 19 | Ht 66.0 in | Wt 170.8 lb

## 2021-09-18 DIAGNOSIS — C773 Secondary and unspecified malignant neoplasm of axilla and upper limb lymph nodes: Secondary | ICD-10-CM | POA: Diagnosis not present

## 2021-09-18 DIAGNOSIS — Z170 Estrogen receptor positive status [ER+]: Secondary | ICD-10-CM | POA: Diagnosis not present

## 2021-09-18 DIAGNOSIS — C50811 Malignant neoplasm of overlapping sites of right female breast: Secondary | ICD-10-CM

## 2021-09-18 DIAGNOSIS — M8580 Other specified disorders of bone density and structure, unspecified site: Secondary | ICD-10-CM | POA: Insufficient documentation

## 2021-09-18 DIAGNOSIS — Z79899 Other long term (current) drug therapy: Secondary | ICD-10-CM | POA: Diagnosis not present

## 2021-09-18 DIAGNOSIS — Z9013 Acquired absence of bilateral breasts and nipples: Secondary | ICD-10-CM | POA: Diagnosis not present

## 2021-09-18 DIAGNOSIS — Z79811 Long term (current) use of aromatase inhibitors: Secondary | ICD-10-CM | POA: Diagnosis not present

## 2021-09-18 DIAGNOSIS — Z923 Personal history of irradiation: Secondary | ICD-10-CM | POA: Diagnosis not present

## 2021-09-18 DIAGNOSIS — M816 Localized osteoporosis [Lequesne]: Secondary | ICD-10-CM

## 2021-09-18 DIAGNOSIS — Z87891 Personal history of nicotine dependence: Secondary | ICD-10-CM | POA: Insufficient documentation

## 2021-09-18 LAB — CBC WITH DIFFERENTIAL/PLATELET
Abs Immature Granulocytes: 0.01 10*3/uL (ref 0.00–0.07)
Basophils Absolute: 0.1 10*3/uL (ref 0.0–0.1)
Basophils Relative: 2 %
Eosinophils Absolute: 0.2 10*3/uL (ref 0.0–0.5)
Eosinophils Relative: 4 %
HCT: 40.9 % (ref 36.0–46.0)
Hemoglobin: 13.7 g/dL (ref 12.0–15.0)
Immature Granulocytes: 0 %
Lymphocytes Relative: 39 %
Lymphs Abs: 1.7 10*3/uL (ref 0.7–4.0)
MCH: 29.5 pg (ref 26.0–34.0)
MCHC: 33.5 g/dL (ref 30.0–36.0)
MCV: 88 fL (ref 80.0–100.0)
Monocytes Absolute: 0.4 10*3/uL (ref 0.1–1.0)
Monocytes Relative: 9 %
Neutro Abs: 2.1 10*3/uL (ref 1.7–7.7)
Neutrophils Relative %: 46 %
Platelets: 263 10*3/uL (ref 150–400)
RBC: 4.65 MIL/uL (ref 3.87–5.11)
RDW: 14.1 % (ref 11.5–15.5)
WBC: 4.5 10*3/uL (ref 4.0–10.5)
nRBC: 0 % (ref 0.0–0.2)

## 2021-09-18 LAB — COMPREHENSIVE METABOLIC PANEL
ALT: 26 U/L (ref 0–44)
AST: 25 U/L (ref 15–41)
Albumin: 4.2 g/dL (ref 3.5–5.0)
Alkaline Phosphatase: 81 U/L (ref 38–126)
Anion gap: 8 (ref 5–15)
BUN: 14 mg/dL (ref 6–20)
CO2: 26 mmol/L (ref 22–32)
Calcium: 8.9 mg/dL (ref 8.9–10.3)
Chloride: 104 mmol/L (ref 98–111)
Creatinine, Ser: 0.88 mg/dL (ref 0.44–1.00)
GFR, Estimated: >60 mL/min (ref >60)
Glucose, Bld: 125 mg/dL — ABNORMAL HIGH (ref 70–99)
Potassium: 4 mmol/L (ref 3.5–5.1)
Sodium: 138 mmol/L (ref 135–145)
Total Bilirubin: 0.4 mg/dL (ref 0.3–1.2)
Total Protein: 6.7 g/dL (ref 6.5–8.1)

## 2021-09-18 MED ORDER — ZOLEDRONIC ACID 4 MG/100ML IV SOLN
4.0000 mg | Freq: Once | INTRAVENOUS | Status: AC
  Administered 2021-09-18: 4 mg via INTRAVENOUS
  Filled 2021-09-18: qty 100

## 2021-09-18 MED ORDER — SODIUM CHLORIDE 0.9 % IV SOLN: Freq: Once | INTRAVENOUS | Status: AC

## 2021-09-18 NOTE — Patient Instructions (Signed)

## 2021-09-18 NOTE — Progress Notes (Signed)
[place]   [phone] [phone]   Clinic Follow up Note   Patient Care Team: Holland Commons, FNP as PCP - General (Internal Medicine) [place], Laurice Record, MD (Inactive) (Orthopedic Surgery) Rutherford Guys, MD (Ophthalmology) Lavonna Monarch, MD (Dermatology) Truitt Merle, MD as Consulting Physician (Hematology) Rolm Bookbinder, MD as Consulting Physician (General Surgery) Kyung Rudd, MD as Consulting Physician (Radiation Oncology) Gardenia Phlegm, NP as Nurse Practitioner (Hematology and Oncology)  Date of Service:  09/18/2021  CHIEF COMPLAINT: f/u of right breast cancer  CURRENT THERAPY:  Surveillance -Zometa, q56month, started 08/20/20  ASSESSMENT & PLAN:  Katie GALENTINEis a 61y.o. post-hysterectomy female with   1. 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 S/p bilateral mastectomy with reconstruction and adjuvant radiation. (Implants ultimately removed 01/24/19 by Dr. TIran Planas) -She started antiestrogen therapy Exemestane on 04/01/16, planned for 7 years. She experienced joint pain and hot flashes on exemestane, anastrozole, and tamoxifen. She opted to continue anastrozole. Given her continued difficulty with joint pain, as well as her low risk mammaprint, she stopped after she completed 5 years therapy in 03/2021. -She is clinically doing well aside from continued hot flashes. She did not mention any joint pains today. Lab reviewed, her CBC and CMP are within normal limits. Her physical exam was unremarkable. There is no clinical concern for recurrence -Continue surveillance. She has a b/l mastectomy and does not need mammogram.  -F/u in 6 months    2. Hot flashes -Currently on Effexor 225 mg daily.  -only somewhat improved off anastrozole   3. H/o of smoking, asthma -she has history of  smoking (1/2 ppd for several years, quit 03/2016) and asthma, has albuterol inhaler to use as needed. -she will follow up with her PCP.   4. Osteopenia -Her 11/21/19 DEXA from PCP showed stable osteopenia (lowest T-score -1.9 in b/l hip). Repeat DEXA 06/26/21 showed some worsening to borderline osteoporosis (T-score -2.4 in left hip). -she began Zometa infusions on 08/20/20. She reports bone pain for several days after, otherwise tolerated well. No dental issues lately  -she reports she is taking vit D     Plan  -proceed with Zometa infusion today -lab, Follow-up, and final Zometa infusion in 6 months   No problem-specific Assessment & Plan notes found for this encounter.   SUMMARY OF ONCOLOGIC HISTORY: Oncology History Overview Note  Cancer Staging Malignant neoplasm of overlapping sites of right breast in female, estrogen receptor positive ([facility] Staging form: Breast, AJCC 8th Edition - Clinical stage from 03/23/2016: Stage IIA (cT3, cN1, cM0, G2, ER: Positive, PR: Positive, HER2: Negative) - Signed by YTruitt Merle MD on 03/31/2016 - Pathologic stage from 05/19/2016: Stage IA (pT2(m), pN1a, cM0, G1, ER: Positive, PR: Positive, HER2: Negative) - Signed by YTruitt Merle MD on 06/18/2016     Malignant neoplasm of overlapping sites of right breast in female, estrogen receptor positive ([place]  03/17/2016 Mammogram   B/l diagnostic mammogram and righ UKoreashowed a 3.6cm irregular mass in the right breast 11:00 position, posterior depth, there is a enlarged lymph node in the right axilla is highly suspicious for malignancy. additional 7 mm oval mass in the right breast lower outer quadrant is suspicious for malignancy.   03/23/2016 Initial Biopsy   Right breast 9:30 position biopsy showed invasive ductal carcinoma, grade 1. Right axillary lymph node biopsy showed metastatic  ductal carcinoma.   03/23/2016 Receptors her2   Both breast and node biopsy tumor ER 100% positive, PR 70-95% positive, HER-2 negative,  Ki-67 40%   03/23/2016 Initial Diagnosis   Malignant neoplasm of upper-outer quadrant of right breast in female, estrogen receptor positive ([place])   03/25/2016 Initial Biopsy   Right breast 11:00 position core needle biopsy showed invasive duct carcinoma, grade 2.    03/25/2016 Receptors her2   ER 95% positive, PR 90% positive, HER-2 negative, Ki-67 15%   03/25/2016 Miscellaneous   Mammaprint showed low risk type A with index +0.105   03/30/2016 Imaging   Bilateral breast MRI with and without contrast showed a large lobulated enhancing mass within the upper-outer and lower outer right breast with surrounding nodularity, measuring 6.1 x 4.4 x 5.6 cm. Multiple critically sick and right axillary lymph nodes are demonstrated measuring up to 1.5 cm.    04/01/2016 -  Anti-estrogen oral therapy   Exemestane 25 mg daily, plan for 7 years. Switched to Anastrozole 48m in 06/2017 due to joint pain and hot flashes. Due to persistent side effects I changed her to Tamoxifen in 09/2018. She stopped Tamoxifen and switched back to anastrozole in 01/2019 because it was more tolerable.    05/19/2016 Surgery   Bilateral mastectomy and right axillary regional lymph node resection.   05/19/2016 Pathology Results   -Right axillary regional lymph node resection revealed metastatic carcinoma in 2/7 lymph nodes. -Left simple mastectomy revealed lobular neoplasia and fibrocystic changes with adenosis and calcifications. 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 -Skin of the right mastectomy flap was benign. -mpT2, pN1a   06/25/2016 - 08/11/2016 Radiation Therapy   Site/dose:    1. 4 field Right breast was treated to 50.4 Gy in 25 fractions at 1.8 Gy per fraction. 2. The Right breast was boosted to 10 Gy in 5  fractions at 2 Gy per fraction.   04/15/2017 Genetic Testing   The patient had genetic testing due to a personal history of breast cancer and family history of breast, ovarian, and colon cancer. The Common Hereditary Cancer Panel was ordered.  The Common Hereditary Cancer Panel offered by [facility] includes sequencing and/or deletion duplication testing of the following 47 genes: APC, ATM, AXIN2, BARD1, BMPR1A, BRCA1, BRCA2, BRIP1, CDH1, CDKN2A (p14ARF), CDKN2A (p16INK4a), CKD4, CHEK2, CTNNA1, DICER1, EPCAM (Deletion/duplication testing only), GREM1 (promoter region deletion/duplication testing only), KIT, MEN1, MLH1, MSH2, MSH3, MSH6, MUTYH, NBN, NF1, NHTL1, PALB2, PDGFRA, PMS2, POLD1, POLE, PTEN, RAD50, RAD51C, RAD51D, SDHB, SDHC, SDHD, SMAD4, SMARCA4. STK11, TP53, TSC1, TSC2, and VHL.  The following genes were evaluated for sequence changes only: SDHA and HOXB13 c.251G>A variant only.  Results: Negative, no pathogenic variants identified. The date of this test report is 04/15/2017   06/28/2017 Surgery   COMPLEX REVISION OF BACK SCAR and LEFT BREAST RECONSTRUCTION WITH SILICONE IMPLANT EXCHANGE AND ACELLULARDERMIS TO LEFT CHEST by Dr. TIran Planas5/6/19      INTERVAL HISTORY:  KOLLIE DELANOis here for a follow up of breast cancer. She was last seen by me on 02/13/21. She presents to the clinic alone. She reports her hot flashes are somewhat better off the anastrozole. She denies any change in appetite or energy level. She reports some pain to her right chest wall.   All other systems were reviewed with the patient and are negative.  MEDICAL HISTORY:  Past Medical History:  Diagnosis Date   Anxiety    Anxiety disorder    Asthma    h/o asthma as a child   Breast cancer ([place])    Cancer ([place]) 04/2016   right breast cancer   Cholelithiasis    Chronic kidney disease    obstruction of R kidney, ( not a stone) - currently resolved    Depression    Diverticulosis    DJD (degenerative joint  disease)    hands & back    Dyspnea    resolved since she stopped smoking    Epigastric abdominal pain    Esophageal stricture    Family history of adverse reaction to anesthesia    daughter has N&V, takes long time to wake up    Family history of colon cancer    Family history of ovarian cancer    Family hx of colon cancer    Female pelvic peritoneal adhesions 10/26/2012   Ganglion cyst    GERD (gastroesophageal reflux disease)    Headache    low grade currently , family history of migraines    Hemorrhoid    History of radiation therapy 05/03 - 08/11/2016   1. 4 field Right breast was treated to 50.4 Gy in 25 fractions at 1.8 Gy per fraction. 2. The Right breast was boosted to 10 Gy in 5 fractions at 2 Gy per fraction.   Hypothyroidism    Plantar fasciitis, bilateral    PONV (postoperative nausea and vomiting)    gets anxious with the mask on her face, also remarks that the scop. patch has helped in the past       SURGICAL HISTORY: Past Surgical History:  Procedure Laterality Date   ABDOMINAL HYSTERECTOMY     BREAST IMPLANT REMOVAL Bilateral 01/24/2019   Procedure: REMOVAL BREAST IMPLANTS;  Surgeon: Irene Limbo, MD;  Location: [facility];  Service: Plastics;  Laterality: Bilateral;   BREAST RECONSTRUCTION WITH PLACEMENT OF TISSUE EXPANDER AND FLEX HD (ACELLULAR HYDRATED DERMIS) Bilateral 05/19/2016   Procedure: BREAST RECONSTRUCTION WITH PLACEMENT OF TISSUE EXPANDER AND ALLODERM PLACEMENT;  Surgeon: Irene Limbo, MD;  Location: [facility];  Service: Plastics;  Laterality: Bilateral;   BREAST RECONSTRUCTION WITH PLACEMENT OF TISSUE EXPANDER AND FLEX HD (ACELLULAR HYDRATED DERMIS) Left 06/28/2017   Procedure: LEFT BREAST RECONSTRUCTION WITH SILICONE IMPLANT EXCHANGE AND ACELLULARDERMIS TO LEFT CHEST;  Surgeon: Irene Limbo, MD;  Location: [facility];  Service: Plastics;  Laterality: Left;   CAPSULECTOMY Bilateral 01/24/2019    Procedure: CAPSULECTOMY;  Surgeon: Irene Limbo, MD;  Location: [facility];  Service: Plastics;  Laterality: Bilateral;   CESAREAN SECTION  1988   CHOLECYSTECTOMY     HEMORRHOID SURGERY     LAPAROSCOPIC BILATERAL SALPINGECTOMY N/A 10/26/2012   Procedure: operative laparoscopy with lysis of adhesions;  Surgeon: Thornell Sartorius, MD;  Location: [facility];  Service: Gynecology;  Laterality: N/A;   LESION EXCISION WITH COMPLEX REPAIR Right 06/09/2019   Procedure: COMPLEX REPAIR RIGHT CHEST 15CM;  Surgeon: Irene Limbo, MD;  Location: [facility];  Service: Plastics;  Laterality: Right;   MASTECTOMY Bilateral    MASTECTOMY WITH RADIOACTIVE SEED GUIDED EXCISION AND AXILLARY SENTINEL LYMPH NODE BIOPSY Bilateral 05/19/2016   Procedure: RIGHT SKIN SPARING MASTECTOMY WITH RIGHT RADIOACTIVE SEED TARGETED DISSECTION AND RIGHT SENTINEL LYMPH NODE BIOPSY, LEFT PROPHYLACTIC SKIN SPARING MASTECTOMY;  Surgeon: Rolm Bookbinder, MD;  Location: [facility];  Service: General;  Laterality:  Bilateral;   REMOVAL OF BILATERAL TISSUE EXPANDERS WITH PLACEMENT OF BILATERAL BREAST IMPLANTS Bilateral 01/29/2017   Procedure: REMOVAL OF BILATERAL TISSUE EXPANDERS WITH PLACEMENT OF BILATERAL SILICONE BREAST IMPLANTS, ALLODERM TO LEFT BREAST RECONSTRUCTION;  RIGHT LATISSUMUS FLAP;  Surgeon: Irene Limbo, MD;  Location: [facility];  Service: Plastics;  Laterality: Bilateral;  Requesting RNFA   SCAR REVISION Right 06/28/2017   Procedure: COMPLEX REVISION OF BACK SCAR;  Surgeon: Irene Limbo, MD;  Location: [facility];  Service: Plastics;  Laterality: Right;   TONSILLECTOMY     TUBAL LIGATION     urologic surgery for ureteropelvic junction obstruction      I have reviewed the social history and family history with the patient and they are unchanged from previous note.  ALLERGIES:  is allergic to adhesive [tape], promethazine hcl, and sulfa antibiotics.  MEDICATIONS:   Current Outpatient Medications  Medication Sig Dispense Refill   acetaminophen (TYLENOL) 500 MG tablet Take 1,000 mg by mouth every 6 (six) hours as needed.     ALPRAZolam (XANAX) 1 MG tablet Take 1 tablet (1 mg total) by mouth 3 (three) times daily. 90 tablet 0   celecoxib (CELEBREX) 200 MG capsule Take 1 capsule (200 mg total) by mouth daily. 30 capsule 2   cloNIDine (CATAPRES) 0.1 MG tablet TAKE 1 TABLET BY MOUTH EVERY DAY 90 tablet 1   levothyroxine (SYNTHROID, LEVOTHROID) 50 MCG tablet Take 50 mcg by mouth daily before breakfast.     methocarbamol (ROBAXIN) 500 MG tablet Take 1 tablet (500 mg total) by mouth every 8 (eight) hours as needed for muscle spasms. 20 tablet 0   omeprazole (PRILOSEC) 40 MG capsule TAKE ONE CAPSULE BY MOUTH EVERY DAY 30 capsule 4   venlafaxine XR (EFFEXOR-XR) 150 MG 24 hr capsule TAKE 1 CAPSULE BY MOUTH EVERY DAY WITH BREAKFAST 90 capsule 1   venlafaxine XR (EFFEXOR-XR) 75 MG 24 hr capsule TAKE 1 CAPSULE BY MOUTH DAILY WITH BREAKFAST. 90 capsule 2   No current facility-administered medications for this visit.    PHYSICAL EXAMINATION: ECOG PERFORMANCE STATUS: 0 - Asymptomatic  Vitals:   09/18/21 1139  BP: 136/89  Pulse: 87  Resp: 19  Temp: 98.4 F (36.9 C)  SpO2: 96%   Wt Readings from Last 3 Encounters:  09/18/21 170 lb 12.8 oz (77.5 kg)  02/13/21 172 lb 9.6 oz (78.3 kg)  08/20/20 166 lb 12.8 oz (75.7 kg)     GENERAL:alert, no distress and comfortable SKIN: skin color, texture, turgor are normal, no rashes or significant lesions EYES: normal, Conjunctiva are pink and non-injected, sclera clear  NECK: supple, thyroid normal size, non-tender, without nodularity LYMPH:  no palpable lymphadenopathy in the cervical, axillary LUNGS: clear to auscultation and percussion with normal breathing effort HEART: regular rate & rhythm and no murmurs and no lower extremity edema ABDOMEN:abdomen soft, non-tender and normal bowel sounds Musculoskeletal:no  cyanosis of digits and no clubbing  NEURO: alert & oriented x 3 with fluent speech, no focal motor/sensory deficits BREAST: No palpable mass, nodules or adenopathy bilaterally. Breast exam benign.   LABORATORY DATA:  I have reviewed the data as listed    Latest Ref Rng & Units 09/18/2021   10:57 AM 02/13/2021   12:03 PM 08/20/2020   12:14 PM  CBC  WBC 4.0 - 10.5 K/uL 4.5  5.2  5.4   Hemoglobin 12.0 - 15.0 g/dL 13.7  13.1  13.5   Hematocrit 36.0 - 46.0 % 40.9  40.1  40.5  Platelets 150 - 400 K/uL 263  239  260         Latest Ref Rng & Units 09/18/2021   10:57 AM 02/13/2021   12:03 PM 08/20/2020   12:14 PM  CMP  Glucose 70 - 99 mg/dL 125  106  100   BUN 6 - 20 mg/dL '14  15  18   ' Creatinine 0.44 - 1.00 mg/dL 0.88  0.87  1.03   Sodium 135 - 145 mmol/L 138  139  141   Potassium 3.5 - 5.1 mmol/L 4.0  3.9  4.2   Chloride 98 - 111 mmol/L 104  106  108   CO2 22 - 32 mmol/L '26  23  23   ' Calcium 8.9 - 10.3 mg/dL 8.9  8.9  9.0   Total Protein 6.5 - 8.1 g/dL 6.7  6.5  6.8   Total Bilirubin 0.3 - 1.2 mg/dL 0.4  0.4  0.4   Alkaline Phos 38 - 126 U/L 81  79  98   AST 15 - 41 U/L '25  25  25   ' ALT 0 - 44 U/L '26  24  24       ' RADIOGRAPHIC STUDIES: I have personally reviewed the radiological images as listed and agreed with the findings in the report. No results found.    No orders of the defined types were placed in this encounter.  All questions were answered. The patient knows to call the clinic with any problems, questions or concerns. No barriers to learning was detected. The total time spent in the appointment was 30 minutes.     Truitt Merle, MD 09/18/2021   I, Wilburn Mylar, am acting as scribe for Truitt Merle, MD.   I have reviewed the above documentation for accuracy and completeness, and I agree with the above.

## 2021-09-23 ENCOUNTER — Telehealth: Payer: Self-pay | Admitting: Hematology

## 2021-09-23 NOTE — Telephone Encounter (Signed)
Left message with follow-up appointment per 7/27 los.

## 2021-10-01 ENCOUNTER — Other Ambulatory Visit: Payer: Self-pay | Admitting: Nurse Practitioner

## 2021-10-01 DIAGNOSIS — R232 Flushing: Secondary | ICD-10-CM

## 2021-11-11 ENCOUNTER — Telehealth: Payer: Self-pay | Admitting: Internal Medicine

## 2021-11-11 NOTE — Telephone Encounter (Signed)
Please advise regarding EGD and if she can be a direct with the colon.

## 2021-11-11 NOTE — Telephone Encounter (Signed)
Inbound call from patient stating that she had a referral for a colonoscopy. Patient stated that she is wanting to schedule to have both colonoscopy and endoscopy done because that's how she had it before with Dr. Henrene Pastor. Patient did state that she does have issues when drinking that she tends to choke a lot. Patient is seeking advice if she can be scheduled for both procedures. Please advise.   Thank you!

## 2021-11-13 NOTE — Telephone Encounter (Signed)
Please see note below from Dr. Henrene Pastor, ok to add EGD.

## 2021-11-13 NOTE — Telephone Encounter (Signed)
Reviewed. OK to add on EGD with possible dilation for "dysphagia"

## 2021-11-17 NOTE — Telephone Encounter (Signed)
LVM for patient to call back to schedule both EGD and colon.

## 2021-12-22 ENCOUNTER — Ambulatory Visit: Payer: BC Managed Care – PPO | Admitting: Physical Therapy

## 2021-12-30 ENCOUNTER — Ambulatory Visit: Payer: BC Managed Care – PPO

## 2022-01-01 DIAGNOSIS — G5603 Carpal tunnel syndrome, bilateral upper limbs: Secondary | ICD-10-CM | POA: Diagnosis not present

## 2022-01-01 DIAGNOSIS — E079 Disorder of thyroid, unspecified: Secondary | ICD-10-CM | POA: Diagnosis not present

## 2022-01-27 ENCOUNTER — Telehealth: Payer: Self-pay

## 2022-01-27 NOTE — Patient Outreach (Signed)
  Care Coordination   01/27/2022 Name: [name] MRN: [id number] DOB: [birth year]   Care Coordination Outreach Attempts:  An unsuccessful telephone outreach was attempted today to offer the patient information about available care coordination services as a benefit of their health plan.   Follow Up Plan:  Additional outreach attempts will be made to offer the patient care coordination information and services.   Encounter Outcome:  No Answer   Care Coordination Interventions:  No, not indicated    Jone Baseman, RN, MSN [facility] Care Management Coordinator Direct Line [phone]

## 2022-02-13 ENCOUNTER — Telehealth: Payer: Self-pay

## 2022-02-13 NOTE — Patient Outreach (Signed)
  Care Coordination   Initial Visit Note   02/13/2022 Name: [name] MRN: [id number] DOB: Jan 20, 1961  [name] is a 61 y.o. year old female who sees [name], [name], [place] for primary care. I spoke with  [name] by phone today.  What matters to the patients health and wellness today?  none    Goals Addressed             This Visit's Progress    COMPLETED: Care Coordination Activities-No follow up required       Care Coordination Interventions: Advised patient to schedule annual exam and flu vaccine.  Flu vaccine completes per patient.   SDOH screening complete  Discussed [name] services and support.  Patient decline needs              SDOH assessments and interventions completed:  Yes  SDOH Interventions Today    Flowsheet Row Most Recent Value  SDOH Interventions   Housing Interventions Intervention Not Indicated  Transportation Interventions Intervention Not Indicated        Care Coordination Interventions:  Yes, provided   Follow up plan: No further intervention required.   Encounter Outcome:  Pt. Visit Completed   Katie Baseman, RN, MSN [place] [facility] Care Management Coordinator Direct Line [phone]

## 2022-02-13 NOTE — Patient Instructions (Signed)
Visit Information  Thank you for taking time to visit with me today. Please don't hesitate to contact me if I can be of assistance to you.   Following are the goals we discussed today:   Goals Addressed             This Visit's Progress    COMPLETED: Care Coordination Activities-No follow up required       Care Coordination Interventions: Advised patient to schedule annual exam and flu vaccine.  Flu vaccine completes per patient.   SDOH screening complete  Discussed [facility] services and support.  Patient decline needs                 If you are experiencing a Mental Health or [place] or need someone to talk to, please call the Suicide and Crisis Lifeline: 988   Patient verbalizes understanding of instructions and care plan provided today and agrees to view in [place]. Active MyChart status and patient understanding of how to access instructions and care plan via MyChart confirmed with patient.     No further follow up required:    Jone Baseman, RN, MSN [place] [facility] Care Management Coordinator Direct Line [phone]

## 2022-03-17 DIAGNOSIS — G5601 Carpal tunnel syndrome, right upper limb: Secondary | ICD-10-CM | POA: Diagnosis not present

## 2022-03-19 ENCOUNTER — Telehealth: Payer: Self-pay | Admitting: Hematology

## 2022-03-19 NOTE — Telephone Encounter (Signed)
Called patient back after speaking with MD about rescheduling appointments on 1/29 to 3/25. MD approved the change. Left voicemail with new appointment information for patient.

## 2022-03-20 ENCOUNTER — Other Ambulatory Visit: Payer: Self-pay | Admitting: Nurse Practitioner

## 2022-03-20 DIAGNOSIS — T50905A Adverse effect of unspecified drugs, medicaments and biological substances, initial encounter: Secondary | ICD-10-CM

## 2022-03-23 ENCOUNTER — Inpatient Hospital Stay: Payer: BC Managed Care – PPO | Admitting: Hematology

## 2022-03-23 ENCOUNTER — Inpatient Hospital Stay: Payer: BC Managed Care – PPO

## 2022-03-31 DIAGNOSIS — M79641 Pain in right hand: Secondary | ICD-10-CM | POA: Diagnosis not present

## 2022-04-14 ENCOUNTER — Telehealth: Payer: Self-pay

## 2022-04-14 ENCOUNTER — Other Ambulatory Visit: Payer: Self-pay

## 2022-04-14 DIAGNOSIS — Z170 Estrogen receptor positive status [ER+]: Secondary | ICD-10-CM

## 2022-04-14 NOTE — Telephone Encounter (Signed)
Pt called asking if Dr. Burr Medico would like for the pt to continue getting Zometa infusion/s.  Pt stated she recalls that when she was last seen (July 2023) that Dr. Burr Medico wanted to see the pt back in 6 months for labs and Zometa infusion.  Pt stated she was supposed to come in in January 2024 but had surgery on her hands; therefore, pt stated she had to cancel and reschedule her appts to March 2024.  Pt stated if Dr. Burr Medico could change the billing code she's submitting to pt's insurance d/t pt is being billed by what's not being covered.  Asked pt if she had contacted her insurance regarding the billing.  Pt stated "NO" she was just assuming that was the problem.  Asked pt if she receive an EOB from her insurance that shows the billing breakdown.  Pt stated "NO".  Instructed pt to contact her insurance regarding the coverage and out-of-pocket deductible.  Pt verbalized understanding and had no further questions or concerns.

## 2022-04-16 ENCOUNTER — Other Ambulatory Visit: Payer: Self-pay

## 2022-04-27 DIAGNOSIS — G5602 Carpal tunnel syndrome, left upper limb: Secondary | ICD-10-CM | POA: Diagnosis not present

## 2022-05-11 DIAGNOSIS — M25532 Pain in left wrist: Secondary | ICD-10-CM | POA: Diagnosis not present

## 2022-05-18 ENCOUNTER — Encounter: Payer: Self-pay | Admitting: Adult Health

## 2022-05-18 ENCOUNTER — Other Ambulatory Visit: Payer: Self-pay

## 2022-05-18 ENCOUNTER — Inpatient Hospital Stay: Payer: BC Managed Care – PPO | Attending: Hematology

## 2022-05-18 ENCOUNTER — Inpatient Hospital Stay (HOSPITAL_BASED_OUTPATIENT_CLINIC_OR_DEPARTMENT_OTHER): Payer: BC Managed Care – PPO | Admitting: Adult Health

## 2022-05-18 ENCOUNTER — Inpatient Hospital Stay: Payer: BC Managed Care – PPO

## 2022-05-18 VITALS — BP 151/67 | HR 85 | Temp 97.7°F | Resp 18 | Ht 66.0 in | Wt 175.6 lb

## 2022-05-18 VITALS — BP 145/82 | HR 86 | Resp 16

## 2022-05-18 DIAGNOSIS — Z170 Estrogen receptor positive status [ER+]: Secondary | ICD-10-CM | POA: Insufficient documentation

## 2022-05-18 DIAGNOSIS — Z9079 Acquired absence of other genital organ(s): Secondary | ICD-10-CM | POA: Insufficient documentation

## 2022-05-18 DIAGNOSIS — M816 Localized osteoporosis [Lequesne]: Secondary | ICD-10-CM

## 2022-05-18 DIAGNOSIS — Z79811 Long term (current) use of aromatase inhibitors: Secondary | ICD-10-CM | POA: Insufficient documentation

## 2022-05-18 DIAGNOSIS — C773 Secondary and unspecified malignant neoplasm of axilla and upper limb lymph nodes: Secondary | ICD-10-CM | POA: Insufficient documentation

## 2022-05-18 DIAGNOSIS — M8580 Other specified disorders of bone density and structure, unspecified site: Secondary | ICD-10-CM | POA: Diagnosis not present

## 2022-05-18 DIAGNOSIS — Z87891 Personal history of nicotine dependence: Secondary | ICD-10-CM | POA: Diagnosis not present

## 2022-05-18 DIAGNOSIS — Z90710 Acquired absence of both cervix and uterus: Secondary | ICD-10-CM | POA: Insufficient documentation

## 2022-05-18 DIAGNOSIS — Z9013 Acquired absence of bilateral breasts and nipples: Secondary | ICD-10-CM | POA: Insufficient documentation

## 2022-05-18 DIAGNOSIS — C50811 Malignant neoplasm of overlapping sites of right female breast: Secondary | ICD-10-CM

## 2022-05-18 LAB — CMP (CANCER CENTER ONLY)
ALT: 28 U/L (ref 0–44)
AST: 29 U/L (ref 15–41)
Albumin: 4.7 g/dL (ref 3.5–5.0)
Alkaline Phosphatase: 73 U/L (ref 38–126)
Anion gap: 9 (ref 5–15)
BUN: 9 mg/dL (ref 6–20)
CO2: 25 mmol/L (ref 22–32)
Calcium: 9.6 mg/dL (ref 8.9–10.3)
Chloride: 106 mmol/L (ref 98–111)
Creatinine: 0.92 mg/dL (ref 0.44–1.00)
GFR, Estimated: >60 mL/min (ref >60)
Glucose, Bld: 112 mg/dL — ABNORMAL HIGH (ref 70–99)
Potassium: 4.1 mmol/L (ref 3.5–5.1)
Sodium: 140 mmol/L (ref 135–145)
Total Bilirubin: 0.4 mg/dL (ref 0.3–1.2)
Total Protein: 6.9 g/dL (ref 6.5–8.1)

## 2022-05-18 LAB — CBC WITH DIFFERENTIAL (CANCER CENTER ONLY)
Abs Immature Granulocytes: 0.01 10*3/uL (ref 0.00–0.07)
Basophils Absolute: 0.1 10*3/uL (ref 0.0–0.1)
Basophils Relative: 1 %
Eosinophils Absolute: 0.2 10*3/uL (ref 0.0–0.5)
Eosinophils Relative: 3 %
HCT: 41.8 % (ref 36.0–46.0)
Hemoglobin: 14 g/dL (ref 12.0–15.0)
Immature Granulocytes: 0 %
Lymphocytes Relative: 38 %
Lymphs Abs: 2.2 10*3/uL (ref 0.7–4.0)
MCH: 29.9 pg (ref 26.0–34.0)
MCHC: 33.5 g/dL (ref 30.0–36.0)
MCV: 89.1 fL (ref 80.0–100.0)
Monocytes Absolute: 0.6 10*3/uL (ref 0.1–1.0)
Monocytes Relative: 11 %
Neutro Abs: 2.7 10*3/uL (ref 1.7–7.7)
Neutrophils Relative %: 47 %
Platelet Count: 267 10*3/uL (ref 150–400)
RBC: 4.69 MIL/uL (ref 3.87–5.11)
RDW: 17.5 % — ABNORMAL HIGH (ref 11.5–15.5)
WBC Count: 5.7 10*3/uL (ref 4.0–10.5)
nRBC: 0 % (ref 0.0–0.2)

## 2022-05-18 MED ORDER — ZOLEDRONIC ACID 4 MG/100ML IV SOLN
4.0000 mg | Freq: Once | INTRAVENOUS | Status: AC
  Administered 2022-05-18: 4 mg via INTRAVENOUS
  Filled 2022-05-18: qty 100

## 2022-05-18 MED ORDER — SODIUM CHLORIDE 0.9 % IV SOLN: Freq: Once | INTRAVENOUS | Status: AC

## 2022-05-18 NOTE — Progress Notes (Signed)
[facility] Follow up:    [name], [address] [place] [address]   DIAGNOSIS:  Cancer Staging  Malignant neoplasm of overlapping sites of right breast in female, estrogen receptor positive ([place]) Staging form: Breast, AJCC 8th Edition - Clinical stage from 03/23/2016: Stage IIA (cT3, cN1, cM0, G2, ER+, PR+, HER2-) - Signed by Truitt Merle, MD on 03/31/2016 Nuclear grade: G2 Histologic grading system: 3 grade system Laterality: Right - Pathologic stage from 05/19/2016: Stage IA (pT2(m), pN1a, cM0, G1, ER+, PR+, HER2-) - Signed by Truitt Merle, MD on 06/18/2016 Neoadjuvant therapy: No Nuclear grade: G1 Multigene prognostic tests performed: MammaPrint Histologic grading system: 3 grade system Laterality: Right Multiple tumors: Yes   SUMMARY OF ONCOLOGIC HISTORY: Oncology History Overview Note  Cancer Staging Malignant neoplasm of overlapping sites of right breast in female, estrogen receptor positive ([place]) Staging form: Breast, AJCC 8th Edition - Clinical stage from 03/23/2016: Stage IIA (cT3, cN1, cM0, G2, ER: Positive, PR: Positive, HER2: Negative) - Signed by Truitt Merle, MD on 03/31/2016 - Pathologic stage from 05/19/2016: Stage IA (pT2(m), pN1a, cM0, G1, ER: Positive, PR: Positive, HER2: Negative) - Signed by Truitt Merle, MD on 06/18/2016     Malignant neoplasm of overlapping sites of right breast in female, estrogen receptor positive ([place])  03/17/2016 Mammogram   B/l diagnostic mammogram and righ US showed a 3.6cm irregular mass in the right breast 11:00 position, posterior depth, there is a enlarged lymph node in the right axilla is highly suspicious for malignancy. additional 7 mm oval mass in the right breast lower outer quadrant is suspicious for malignancy.   03/23/2016 Initial Biopsy   Right breast 9:30 position biopsy showed invasive ductal carcinoma, grade 1. 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    03/23/2016 Receptors her2   Both breast and node biopsy tumor ER 100% positive, PR 70-95% positive, HER-2 negative, Ki-67 40%   03/23/2016 Initial Diagnosis   Malignant neoplasm of upper-outer quadrant of right breast in female, estrogen receptor positive ([place])   03/25/2016 Initial Biopsy   Right breast 11:00 position core needle biopsy showed invasive duct carcinoma, grade 2.    03/25/2016 Receptors her2   ER 95% positive, PR 90% positive, HER-2 negative, Ki-67 15%   03/25/2016 Miscellaneous   Mammaprint showed low risk type A with index +0.105   03/30/2016 Imaging   Bilateral breast MRI with and without contrast showed a large lobulated enhancing mass within the upper-outer and lower outer right breast with surrounding nodularity, measuring 6.1 x 4.4 x 5.6 cm. Multiple critically sick and right axillary lymph nodes are demonstrated measuring up to 1.5 cm.    04/01/2016 -  Anti-estrogen oral therapy   Exemestane 25 mg daily, plan for 7 years. Switched to Anastrozole 1mg  in 06/2017 due to joint pain and hot flashes. Due to persistent side effects I changed her to Tamoxifen in 09/2018. She stopped Tamoxifen and switched back to anastrozole in 01/2019 because it was more tolerable.    05/19/2016 Surgery   Bilateral mastectomy and right axillary regional lymph node resection.   05/19/2016 Pathology Results   -Right axillary regional lymph node resection revealed metastatic carcinoma in 2/7 lymph nodes. -Left simple mastectomy revealed lobular neoplasia and fibrocystic changes with adenosis and calcifications. 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 -Skin  of the right mastectomy flap was benign. -mpT2, pN1a   06/25/2016 - 08/11/2016 Radiation Therapy   Site/dose:    1. 4 field Right breast  was treated to 50.4 Gy in 25 fractions at 1.8 Gy per fraction. 2. The Right breast was boosted to 10 Gy in 5 fractions at 2 Gy per fraction.   04/15/2017 Genetic Testing   The patient had genetic testing due to a personal history of breast cancer and family history of breast, ovarian, and colon cancer. The Common Hereditary Cancer Panel was ordered.  The Common Hereditary Cancer Panel offered by [facility] includes sequencing and/or deletion duplication testing of the following 47 genes: APC, ATM, AXIN2, BARD1, BMPR1A, BRCA1, BRCA2, BRIP1, CDH1, CDKN2A (p14ARF), CDKN2A (p16INK4a), CKD4, CHEK2, CTNNA1, DICER1, EPCAM (Deletion/duplication testing only), GREM1 (promoter region deletion/duplication testing only), KIT, MEN1, MLH1, MSH2, MSH3, MSH6, MUTYH, NBN, NF1, NHTL1, PALB2, PDGFRA, PMS2, POLD1, POLE, PTEN, RAD50, RAD51C, RAD51D, SDHB, SDHC, SDHD, SMAD4, SMARCA4. STK11, TP53, TSC1, TSC2, and VHL.  The following genes were evaluated for sequence changes only: SDHA and HOXB13 c.251G>A variant only.  Results: Negative, no pathogenic variants identified. The date of this test report is 04/15/2017   06/28/2017 Surgery   COMPLEX REVISION OF BACK SCAR and LEFT BREAST RECONSTRUCTION WITH SILICONE IMPLANT EXCHANGE AND ACELLULARDERMIS TO LEFT CHEST by Dr. Iran Planas 06/28/17     CURRENT THERAPY: Zometa  INTERVAL HISTORY: [name] 62 y.o. female returns for f/u prior to receiving her final Zometa.  She received this every 6 months and is due for her final dose today.  She has completed antiestrogen therapy with aromatase inhibitors and is status post bilateral mastectomies.  She has no concerns at her mastectomy sites other than the desire to possibly get reconstruction at [facility] in the future.  She is doing well and wants to know what follow-up will look like since her general practitioner does her breast exams annually.   Patient Active Problem List   Diagnosis Date Noted   Osteoporosis 07/10/2020    Family history of von Willebrand disease 06/21/2017   Genetic testing 04/29/2017   Family history of ovarian cancer    Family history of colon cancer    Acquired absence of breast and nipple 01/29/2017   Breast cancer, right ([place]) 05/19/2016   Malignant neoplasm of overlapping sites of right breast in female, estrogen receptor positive ([place]) 03/31/2016   Female pelvic peritoneal adhesions 10/26/2012   Hydrosalpinx 10/25/2012   Sciatica of right side 03/31/2012   Routine general medical examination at a health care facility 08/27/2010   SPONTANEOUS ECCHYMOSES 01/03/2009   ANXIETY DISORDER 07/04/2007   HYPOTHYROIDISM 06/23/2007   HEMORRHOIDS 06/23/2007   ESOPHAGEAL STRICTURE 06/23/2007   GERD 06/23/2007   DEGENERATIVE JOINT DISEASE 06/23/2007   ARTHRITIS 06/23/2007   SLEEP APNEA 06/23/2007    is allergic to adhesive [tape], promethazine hcl, and sulfa antibiotics.  MEDICAL HISTORY: Past Medical History:  Diagnosis Date   Anxiety    Anxiety disorder    Asthma    h/o asthma as a child   Breast cancer ([place])    Cancer ([place]) 04/2016   right breast cancer   Cholelithiasis    Chronic kidney disease    obstruction of R kidney, ( not a stone) - currently resolved    Depression    Diverticulosis    DJD (degenerative joint disease)    hands & back    Dyspnea    resolved since she stopped smoking    Epigastric abdominal  pain    Esophageal stricture    Family history of adverse reaction to anesthesia    daughter has N&V, takes long time to wake up    Family history of colon cancer    Family history of ovarian cancer    Family hx of colon cancer    Female pelvic peritoneal adhesions 10/26/2012   Ganglion cyst    GERD (gastroesophageal reflux disease)    Headache    low grade currently , family history of migraines    Hemorrhoid    History of radiation therapy 05/03 - 08/11/2016   1. 4 field Right breast was treated to 50.4 Gy in 25 fractions at 1.8 Gy per fraction. 2. The Right  breast was boosted to 10 Gy in 5 fractions at 2 Gy per fraction.   Hypothyroidism    Plantar fasciitis, bilateral    PONV (postoperative nausea and vomiting)    gets anxious with the mask on her face, also remarks that the scop. patch has helped in the past       SURGICAL HISTORY: Past Surgical History:  Procedure Laterality Date   ABDOMINAL HYSTERECTOMY     BREAST IMPLANT REMOVAL Bilateral 01/24/2019   Procedure: REMOVAL BREAST IMPLANTS;  Surgeon: Irene Limbo, MD;  Location: [facility];  Service: Plastics;  Laterality: Bilateral;   BREAST RECONSTRUCTION WITH PLACEMENT OF TISSUE EXPANDER AND FLEX HD (ACELLULAR HYDRATED DERMIS) Bilateral 05/19/2016   Procedure: BREAST RECONSTRUCTION WITH PLACEMENT OF TISSUE EXPANDER AND ALLODERM PLACEMENT;  Surgeon: Irene Limbo, MD;  Location: [facility];  Service: Plastics;  Laterality: Bilateral;   BREAST RECONSTRUCTION WITH PLACEMENT OF TISSUE EXPANDER AND FLEX HD (ACELLULAR HYDRATED DERMIS) Left 06/28/2017   Procedure: LEFT BREAST RECONSTRUCTION WITH SILICONE IMPLANT EXCHANGE AND ACELLULARDERMIS TO LEFT CHEST;  Surgeon: Irene Limbo, MD;  Location: [facility];  Service: Plastics;  Laterality: Left;   CAPSULECTOMY Bilateral 01/24/2019   Procedure: CAPSULECTOMY;  Surgeon: Irene Limbo, MD;  Location: [facility];  Service: Plastics;  Laterality: Bilateral;   CESAREAN SECTION  1988   CHOLECYSTECTOMY     HEMORRHOID SURGERY     LAPAROSCOPIC BILATERAL SALPINGECTOMY N/A 10/26/2012   Procedure: operative laparoscopy with lysis of adhesions;  Surgeon: Thornell Sartorius, MD;  Location: [facility];  Service: Gynecology;  Laterality: N/A;   LESION EXCISION WITH COMPLEX REPAIR Right 06/09/2019   Procedure: COMPLEX REPAIR RIGHT CHEST 15CM;  Surgeon: Irene Limbo, MD;  Location: [facility];  Service: Plastics;  Laterality: Right;   MASTECTOMY Bilateral    MASTECTOMY WITH RADIOACTIVE SEED  GUIDED EXCISION AND AXILLARY SENTINEL LYMPH NODE BIOPSY Bilateral 05/19/2016   Procedure: RIGHT SKIN SPARING MASTECTOMY WITH RIGHT RADIOACTIVE SEED TARGETED DISSECTION AND RIGHT SENTINEL LYMPH NODE BIOPSY, LEFT PROPHYLACTIC SKIN SPARING MASTECTOMY;  Surgeon: Rolm Bookbinder, MD;  Location: [facility];  Service: General;  Laterality: Bilateral;   REMOVAL OF BILATERAL TISSUE EXPANDERS WITH PLACEMENT OF BILATERAL BREAST IMPLANTS Bilateral 01/29/2017   Procedure: REMOVAL OF BILATERAL TISSUE EXPANDERS WITH PLACEMENT OF BILATERAL SILICONE BREAST IMPLANTS, ALLODERM TO LEFT BREAST RECONSTRUCTION;  RIGHT LATISSUMUS FLAP;  Surgeon: Irene Limbo, MD;  Location: [facility];  Service: Plastics;  Laterality: Bilateral;  Requesting RNFA   SCAR REVISION Right 06/28/2017   Procedure: COMPLEX REVISION OF BACK SCAR;  Surgeon: Irene Limbo, MD;  Location: [facility];  Service: Plastics;  Laterality: Right;   TONSILLECTOMY     TUBAL LIGATION     urologic surgery for ureteropelvic  junction obstruction      SOCIAL HISTORY: Social History   Socioeconomic History   Marital status: Married    Spouse name: Not on file   Number of children: 2   Years of education: 14   Highest education level: Not on file  Occupational History   Occupation: Passenger transport manager, Passenger transport manager    Employer: [employer]  Tobacco Use   Smoking status: Former    Packs/day: 0.50    Years: 15.00    Additional pack years: 0.00    Total pack years: 7.50    Types: Cigarettes    Quit date: 04/08/2016    Years since quitting: 6.1   Smokeless tobacco: Never  Substance and Sexual Activity   Alcohol use: Yes    Alcohol/week: 14.0 standard drinks of alcohol    Types: 12 Cans of beer, 2 Standard drinks or equivalent per week    Comment: moderate, daily    Drug use: No   Sexual activity: Yes    Partners: Male    Birth control/protection: Surgical  Other Topics Concern   Not on file  Social History Narrative    HSG, some community college. .Married -'81.   2 daughters  '83, '88 daughter  with bipolar dz. Has had behavior issues. 1 granddaughter '05 living with her. Occupation:Bank worker. Marriage in good. No history of abuse.                  Social Determinants of Health   Financial Resource Strain: Not on file  Food Insecurity: Not on file  Transportation Needs: No Transportation Needs (02/13/2022)   PRAPARE - Hydrologist (Medical): No    Lack of Transportation (Non-Medical): No  Physical Activity: Not on file  Stress: Not on file  Social Connections: Not on file  Intimate Partner Violence: Not on file    FAMILY HISTORY: Family History  Problem Relation Age of Onset   COPD Mother    Ovarian cancer Mother 55       surgery, no chemo   Kidney disease Mother    Diabetes Father    Heart disease Father    Hypertension Father    Colon cancer Father 74   Other Father        Amyloidosis   Cancer Father        2nd cancer later in life, type unk   Hypertension Sister    Depression Sister    Arthritis Sister    Von Willebrand disease Sister    Breast cancer Paternal Grandmother        pt doesn't remember, but her sister says this grandmother had br cancer   Goiter Paternal Grandmother    Penile cancer Paternal Uncle 73   Rheum arthritis Maternal Grandmother     Review of Systems  Constitutional:  Negative for appetite change, chills, fatigue, fever and unexpected weight change.  HENT:   Negative for hearing loss, lump/mass and trouble swallowing.   Eyes:  Negative for eye problems and icterus.  Respiratory:  Negative for chest tightness, cough and shortness of breath.   Cardiovascular:  Negative for chest pain, leg swelling and palpitations.  Gastrointestinal:  Negative for abdominal distention, abdominal pain, constipation, diarrhea, nausea and vomiting.  Endocrine: Negative for hot flashes.  Genitourinary:  Negative for difficulty urinating.    Musculoskeletal:  Negative for arthralgias.  Skin:  Negative for itching and rash.  Neurological:  Negative for dizziness, extremity weakness, headaches and numbness.  Hematological:  Negative for adenopathy. Does not bruise/bleed easily.  Psychiatric/Behavioral:  Negative for depression. The patient is not nervous/anxious.       PHYSICAL EXAMINATION  ECOG PERFORMANCE STATUS: 0 - Asymptomatic  Vitals:   05/18/22 1418  BP: (!) 151/67  Pulse: 85  Resp: 18  Temp: 97.7 F (36.5 C)  SpO2: 100%    Physical Exam Constitutional:      General: She is not in acute distress.    Appearance: Normal appearance. She is not toxic-appearing.  HENT:     Head: Normocephalic and atraumatic.  Eyes:     General: No scleral icterus. Cardiovascular:     Rate and Rhythm: Normal rate and regular rhythm.     Pulses: Normal pulses.     Heart sounds: Normal heart sounds.  Pulmonary:     Effort: Pulmonary effort is normal.     Breath sounds: Normal breath sounds.  Chest:     Comments: Status post bilateral mastectomies.  No sign of local recurrence. Abdominal:     General: Abdomen is flat. Bowel sounds are normal. There is no distension.     Palpations: Abdomen is soft.     Tenderness: There is no abdominal tenderness.  Musculoskeletal:        General: No swelling.     Cervical back: Neck supple.  Lymphadenopathy:     Cervical: No cervical adenopathy.  Skin:    General: Skin is warm and dry.     Findings: No rash.  Neurological:     General: No focal deficit present.     Mental Status: She is alert.  Psychiatric:        Mood and Affect: Mood normal.        Behavior: Behavior normal.     LABORATORY DATA:  CBC    Component Value Date/Time   WBC 5.7 05/18/2022 1345   WBC 4.5 09/18/2021 1057   RBC 4.69 05/18/2022 1345   HGB 14.0 05/18/2022 1345   HGB 11.2 (L) 02/19/2017 1304   HCT 41.8 05/18/2022 1345   HCT 34.9 02/19/2017 1304   PLT 267 05/18/2022 1345   PLT 302 02/19/2017  1304   MCV 89.1 05/18/2022 1345   MCV 84.2 02/19/2017 1304   MCH 29.9 05/18/2022 1345   MCHC 33.5 05/18/2022 1345   RDW 17.5 (H) 05/18/2022 1345   RDW 17.0 (H) 02/19/2017 1304   LYMPHSABS 2.2 05/18/2022 1345   LYMPHSABS 1.4 02/19/2017 1304   MONOABS 0.6 05/18/2022 1345   MONOABS 0.5 02/19/2017 1304   EOSABS 0.2 05/18/2022 1345   EOSABS 0.3 02/19/2017 1304   BASOSABS 0.1 05/18/2022 1345   BASOSABS 0.1 02/19/2017 1304    CMP     Component Value Date/Time   NA 140 05/18/2022 1345   NA 141 02/19/2017 1304   K 4.1 05/18/2022 1345   K 4.0 02/19/2017 1304   CL 106 05/18/2022 1345   CO2 25 05/18/2022 1345   CO2 26 02/19/2017 1304   GLUCOSE 112 (H) 05/18/2022 1345   GLUCOSE 99 02/19/2017 1304   BUN 9 05/18/2022 1345   BUN 16.4 02/19/2017 1304   CREATININE 0.92 05/18/2022 1345   CREATININE 0.9 02/19/2017 1304   CALCIUM 9.6 05/18/2022 1345   CALCIUM 8.8 02/19/2017 1304   PROT 6.9 05/18/2022 1345   PROT 6.2 (L) 02/19/2017 1304   ALBUMIN 4.7 05/18/2022 1345   ALBUMIN 3.6 02/19/2017 1304   AST 29 05/18/2022 1345   AST 22 02/19/2017 1304   ALT 28  05/18/2022 1345   ALT 23 02/19/2017 1304   ALKPHOS 73 05/18/2022 1345   ALKPHOS 88 02/19/2017 1304   BILITOT 0.4 05/18/2022 1345   BILITOT 0.39 02/19/2017 1304   GFRNONAA >60 05/18/2022 1345   GFRNONAA 72 11/06/2015 0831   GFRAA >60 10/12/2019 1445   GFRAA 83 11/06/2015 0831      ASSESSMENT and THERAPY PLAN:   Malignant neoplasm of overlapping sites of right breast in female, estrogen receptor positive ([place]) [name] is a 62 year old woman with history of right-sided stage Ia ER/PR positive breast cancer diagnosed in 2018 status post bilateral mastectomy, adjuvant radiation, and antiestrogen therapy with aromatase inhibitors that she completed in 2023.  [name] has no clinical or radiographic signs of breast cancer recurrence.  She continues on Zometa due to osteopenia and is due for her final dose today.  Her most recent bone  density showed improvement in her osteopenia.  I recommended she continue to undergo bone density testing every 2 years.  [name] and I discussed next steps with her monitoring.  I offered her a return visit with [place] in 1 year however since her PCP does a good breast exam she would prefer to follow-up with [place] as needed.    She knows to call for any questions or concerns and we were always happy to get her back in in the future.  All questions were answered. The patient knows to call the clinic with any problems, questions or concerns. We can certainly see the patient much sooner if necessary.  Total encounter time:20 minutes*in face-to-face visit time, chart review, lab review, care coordination, order entry, and documentation of the encounter time.    Wilber Bihari, NP 05/18/22 3:17 PM Medical Oncology and Hematology [facility] [place], [place] [address] Tel. [phone]    Fax. [phone]  *Total Encounter Time as defined by the Centers for Medicare and Medicaid Services includes, in addition to the face-to-face time of a patient visit (documented in the note above) non-face-to-face time: obtaining and reviewing outside history, ordering and reviewing medications, tests or procedures, care coordination (communications with other health care professionals or caregivers) and documentation in the medical record.

## 2022-05-18 NOTE — Assessment & Plan Note (Signed)
[name] is a 61 year old woman with history of right-sided stage Ia ER/PR positive breast cancer diagnosed in 2018 status post bilateral mastectomy, adjuvant radiation, and antiestrogen therapy with aromatase inhibitors that she completed in 2023.  [name] has no clinical or radiographic signs of breast cancer recurrence.  She continues on Zometa due to osteopenia and is due for her final dose today.  Her most recent bone density showed improvement in her osteopenia.  I recommended she continue to undergo bone density testing every 2 years.  [name] and I discussed next steps with her monitoring.  I offered her a return visit with [place] in 1 year however since her PCP does a good breast exam she would prefer to follow-up with [place] as needed.    She knows to call for any questions or concerns and we were always happy to get her back in in the future.

## 2022-05-18 NOTE — Patient Instructions (Signed)

## 2022-05-18 NOTE — Patient Instructions (Signed)
Bone Health Bones protect organs, store calcium, anchor muscles, and support the whole body. Keeping your bones strong is important, especially as you get older. You can take actions to help keep your bones strong and healthy. Why is keeping my bones healthy important?  Keeping your bones healthy is important because your body constantly replaces bone cells. Cells get old, and new cells take their place. As we age, we lose bone cells because the body may not be able to make enough new cells to replace the old cells. The amount of bone cells and bone tissue you have is referred to as bone mass. The higher your bone mass, the stronger your bones. The aging process leads to an overall loss of bone mass in the body, which can increase the likelihood of: Broken bones. A condition in which the bones become weak and brittle (osteoporosis). A large decline in bone mass occurs in older adults. In women, it occurs about the time of menopause. What actions can I take to keep my bones healthy? Good health habits are important for maintaining healthy bones. This includes eating nutritious foods and exercising regularly. To have healthy bones, you need to get enough of the right minerals and vitamins. Most nutrition experts recommend getting these nutrients from the foods that you eat. In some cases, taking supplements may also be recommended. Doing certain types of exercise is also important for bone health. What are the nutritional recommendations for healthy bones?  Eating a well-balanced diet with plenty of calcium and vitamin D will help to protect your bones. Nutritional recommendations vary from person to person. Ask your health care provider what is healthy for you. Here are some general guidelines. Get enough calcium Calcium is the most important (essential) mineral for bone health. Most people can get enough calcium from their diet, but supplements may be recommended for people who are at risk for  osteoporosis. Good sources of calcium include: Dairy products, such as low-fat or nonfat milk, cheese, and yogurt. Dark green leafy vegetables, such as bok choy and broccoli. Foods that have calcium added to them (are fortified). Foods that may be fortified with calcium include orange juice, cereal, bread, soy beverages, and tofu products. Nuts, such as almonds. Follow these recommended amounts for daily calcium intake: Infants, 0-6 months: 200 mg. Infants, 6-12 months: 260 mg. Children, age 1-3: 700 mg. Children, age 4-8: 1,000 mg. Children, age 9-13: 1,300 mg. Teens, age 14-18: 1,300 mg. Adults, age 19-50: 1,000 mg. Adults, age 51-70: Men: 1,000 mg. Women: 1,200 mg. Adults, age 71 or older: 1,200 mg. Pregnant and breastfeeding females: Teens: 1,300 mg. Adults: 1,000 mg. Get enough vitamin D Vitamin D is the most essential vitamin for bone health. It helps the body absorb calcium. Sunlight stimulates the skin to make vitamin D, so be sure to get enough sunlight. If you live in a cold climate or you do not get outside often, your health care provider may recommend that you take vitamin D supplements. Good sources of vitamin D in your diet include: Egg yolks. Saltwater fish. Milk and cereal fortified with vitamin D. Follow these recommended amounts for daily vitamin D intake: Infants, 0-12 months: 400 international units (IU). Children and teens, age 1-18: 600 international units. Adults, age 59 or younger: 600 international units. Adults, age 60 or older: 600-1,000 international units. Get other important nutrients Other nutrients that are important for bone health include: Phosphorus. This mineral is found in meat, poultry, dairy foods, nuts, and legumes. The   recommended daily intake for adult men and adult women is 700 mg. Magnesium. This mineral is found in seeds, nuts, dark green vegetables, and legumes. The recommended daily intake for adult men is 400-420 mg. For adult women,  it is 310-320 mg. Vitamin K. This vitamin is found in green leafy vegetables. The recommended daily intake is 120 mcg for adult men and 90 mcg for adult women. What type of physical activity is best for building and maintaining healthy bones? Weight-bearing and strength-building activities are important for building and maintaining healthy bones. Weight-bearing activities cause muscles and bones to work against gravity. Strength-building activities increase the strength of the muscles that support bones. Weight-bearing and muscle-building activities include: Walking and hiking. Jogging and running. Dancing. Gym exercises. Lifting weights. Tennis and racquetball. Climbing stairs. Aerobics. Adults should get at least 30 minutes of moderate physical activity on most days. Children should get at least 60 minutes of moderate physical activity on most days. Ask your health care provider what type of exercise is best for you. How can I find out if my bone mass is low? Bone mass can be measured with an X-ray test called a bone mineral density (BMD) test. This test is recommended for all women who are age 65 or older. It may also be recommended for: Men who are age 70 or older. People who are at risk for osteoporosis because of: Having a long-term disease that weakens bones, such as kidney disease or rheumatoid arthritis. Having menopause earlier than normal. Taking medicine that weakens bones, such as steroids, thyroid hormones, or hormone treatment for breast cancer or prostate cancer. Smoking. Drinking three or more alcoholic drinks a day. Being underweight. Sedentary lifestyle. If you find that you have a low bone mass, you may be able to prevent osteoporosis or further bone loss by changing your diet and lifestyle. Where can I find more information? Bone Health & Osteoporosis Foundation: www.nof.org/patients National Institutes of Health: www.bones.nih.gov International Osteoporosis  Foundation: www.iofbonehealth.org Summary The aging process leads to an overall loss of bone mass in the body, which can increase the likelihood of broken bones and osteoporosis. Eating a well-balanced diet with plenty of calcium and vitamin D will help to protect your bones. Weight-bearing and strength-building activities are also important for building and maintaining strong bones. Bone mass can be measured with an X-ray test called a bone mineral density (BMD) test. This information is not intended to replace advice given to you by your health care provider. Make sure you discuss any questions you have with your health care provider. Document Revised: 07/24/2020 Document Reviewed: 07/24/2020 Elsevier Patient Education  2023 Elsevier Inc.  

## 2022-07-29 DIAGNOSIS — E039 Hypothyroidism, unspecified: Secondary | ICD-10-CM | POA: Diagnosis not present

## 2022-07-29 DIAGNOSIS — M810 Age-related osteoporosis without current pathological fracture: Secondary | ICD-10-CM | POA: Diagnosis not present

## 2022-07-29 DIAGNOSIS — Z0000 Encounter for general adult medical examination without abnormal findings: Secondary | ICD-10-CM | POA: Diagnosis not present

## 2022-07-29 DIAGNOSIS — F418 Other specified anxiety disorders: Secondary | ICD-10-CM | POA: Diagnosis not present

## 2022-09-10 ENCOUNTER — Other Ambulatory Visit: Payer: Self-pay | Admitting: Hematology

## 2022-09-10 ENCOUNTER — Other Ambulatory Visit: Payer: Self-pay

## 2022-09-10 DIAGNOSIS — T50905A Adverse effect of unspecified drugs, medicaments and biological substances, initial encounter: Secondary | ICD-10-CM

## 2022-09-28 DIAGNOSIS — Z853 Personal history of malignant neoplasm of breast: Secondary | ICD-10-CM | POA: Diagnosis not present

## 2022-09-28 DIAGNOSIS — E079 Disorder of thyroid, unspecified: Secondary | ICD-10-CM | POA: Diagnosis not present

## 2022-09-28 DIAGNOSIS — G5603 Carpal tunnel syndrome, bilateral upper limbs: Secondary | ICD-10-CM | POA: Diagnosis not present

## 2022-09-28 DIAGNOSIS — Z4789 Encounter for other orthopedic aftercare: Secondary | ICD-10-CM | POA: Diagnosis not present

## 2023-02-11 ENCOUNTER — Encounter: Payer: Self-pay | Admitting: Physician Assistant

## 2023-02-18 ENCOUNTER — Ambulatory Visit: Payer: BC Managed Care – PPO | Admitting: Physician Assistant

## 2023-02-18 ENCOUNTER — Encounter: Payer: Self-pay | Admitting: Physician Assistant

## 2023-02-18 VITALS — BP 126/82 | HR 94 | Ht 65.0 in | Wt 142.0 lb

## 2023-02-18 DIAGNOSIS — R1310 Dysphagia, unspecified: Secondary | ICD-10-CM | POA: Diagnosis not present

## 2023-02-18 DIAGNOSIS — Z8719 Personal history of other diseases of the digestive system: Secondary | ICD-10-CM

## 2023-02-18 DIAGNOSIS — K2100 Gastro-esophageal reflux disease with esophagitis, without bleeding: Secondary | ICD-10-CM

## 2023-02-18 DIAGNOSIS — Z800 Family history of malignant neoplasm of digestive organs: Secondary | ICD-10-CM | POA: Diagnosis not present

## 2023-02-18 DIAGNOSIS — K219 Gastro-esophageal reflux disease without esophagitis: Secondary | ICD-10-CM | POA: Diagnosis not present

## 2023-02-18 MED ORDER — METOCLOPRAMIDE HCL 10 MG PO TABS: ORAL_TABLET | ORAL | 0 refills | Status: DC

## 2023-02-18 MED ORDER — OMEPRAZOLE 40 MG PO CPDR: 40.0000 mg | DELAYED_RELEASE_CAPSULE | Freq: Two times a day (BID) | ORAL | 2 refills | Status: DC

## 2023-02-18 MED ORDER — NA SULFATE-K SULFATE-MG SULF 17.5-3.13-1.6 GM/177ML PO SOLN: 1.0000 | Freq: Once | ORAL | 0 refills | Status: AC

## 2023-02-18 NOTE — Progress Notes (Signed)
Chief Complaint: Family history of colon cancer and dysphagia  HPI:    [name] is a 62 y/o female with a past medical history as listed below including breast cancer, depression, CKD and multiple others, known to Dr. Marina Goodell, who was referred to me by Fatima Sanger, FNP for a complaint of family history of colon cancer and dysphagia.      2009 EGD and colonoscopy.  Colonoscopy with diverticulosis.  Follow-up recommended in 5 years given family history of colon cancer.  EGD with distal esophageal stricture which was dilated with a 16.5 mm balloon and small hiatal hernia.    08/23/2015 office visit with Dr. Marina Goodell, at that time discussed reflux complicated by peptic stricture and a family history of colon cancer for which she had already undergone colonoscopy.  At that time continuing with GERD despite medical therapy and dysphagia.  At that time using Ranitidine 150 twice daily.  She was scheduled for an EGD and colonoscopy.    09/27/2015 EGD with benign-appearing esophageal stenosis dilated with a 54 [place].  Continued on Meprazole 40 mg daily.    09/27/2015 colonoscopy with a 4 mm polyp in the ascending colon, diverticulosis in the sigmoid colon internal hemorrhoids.  Repeat recommended in 5 years.  Polyps were benign.  Repeat recommended in 5 years.    Today, the patient tells me that she seemed to be doing okay swallowing until about 2 months ago.  Now everything seems to give her trouble unless it is very soft, even water seems like it can get hung up, due to this feels like she is lost around 20 pounds over the past few months.  She knows she needs to be dilated again.  Currently on Omeprazole 40 mg daily but does not feel like it is working well, has occasionally taken 2.    Also describes she is due for colonoscopy given a family history of colon cancer.    Denies fever, chills, nausea, vomiting or abdominal pain.  Past Medical History:  Diagnosis Date   Anxiety    Anxiety disorder     Asthma    h/o asthma as a child   Breast cancer (HCC)    Cancer (HCC) 04/2016   right breast cancer   Cholelithiasis    Chronic kidney disease    obstruction of R kidney, ( not a stone) - currently resolved    Depression    Diverticulosis    DJD (degenerative joint disease)    hands & back    Dyspnea    resolved since she stopped smoking    Epigastric abdominal pain    Esophageal stricture    Family history of adverse reaction to anesthesia    daughter has N&V, takes long time to wake up    Family history of colon cancer    Family history of ovarian cancer    Family hx of colon cancer    Female pelvic peritoneal adhesions 10/26/2012   Ganglion cyst    GERD (gastroesophageal reflux disease)    Headache    low grade currently , family history of migraines    Hemorrhoid    History of radiation therapy 05/03 - 08/11/2016   1. 4 field Right breast was treated to 50.4 Gy in 25 fractions at 1.8 Gy per fraction. 2. The Right breast was boosted to 10 Gy in 5 fractions at 2 Gy per fraction.   Hypothyroidism    Plantar fasciitis, bilateral    PONV (postoperative nausea and  vomiting)    gets anxious with the mask on her face, also remarks that the scop. patch has helped in the past       Past Surgical History:  Procedure Laterality Date   ABDOMINAL HYSTERECTOMY     BREAST IMPLANT REMOVAL Bilateral 01/24/2019   Procedure: REMOVAL BREAST IMPLANTS;  Surgeon: Glenna Fellows, MD;  Location: [facility];  Service: Plastics;  Laterality: Bilateral;   BREAST RECONSTRUCTION WITH PLACEMENT OF TISSUE EXPANDER AND FLEX HD (ACELLULAR HYDRATED DERMIS) Bilateral 05/19/2016   Procedure: BREAST RECONSTRUCTION WITH PLACEMENT OF TISSUE EXPANDER AND ALLODERM PLACEMENT;  Surgeon: Glenna Fellows, MD;  Location: [facility];  Service: Plastics;  Laterality: Bilateral;   BREAST RECONSTRUCTION WITH PLACEMENT OF TISSUE EXPANDER AND FLEX HD (ACELLULAR HYDRATED DERMIS) Left 06/28/2017    Procedure: LEFT BREAST RECONSTRUCTION WITH SILICONE IMPLANT EXCHANGE AND ACELLULARDERMIS TO LEFT CHEST;  Surgeon: Glenna Fellows, MD;  Location: [facility];  Service: Plastics;  Laterality: Left;   CAPSULECTOMY Bilateral 01/24/2019   Procedure: CAPSULECTOMY;  Surgeon: Glenna Fellows, MD;  Location: [facility];  Service: Plastics;  Laterality: Bilateral;   CESAREAN SECTION  1988   CHOLECYSTECTOMY     HEMORRHOID SURGERY     LAPAROSCOPIC BILATERAL SALPINGECTOMY N/A 10/26/2012   Procedure: operative laparoscopy with lysis of adhesions;  Surgeon: Sherron Monday, MD;  Location: [facility];  Service: Gynecology;  Laterality: N/A;   LESION EXCISION WITH COMPLEX REPAIR Right 06/09/2019   Procedure: COMPLEX REPAIR RIGHT CHEST 15CM;  Surgeon: Glenna Fellows, MD;  Location: [facility];  Service: Plastics;  Laterality: Right;   MASTECTOMY Bilateral    MASTECTOMY WITH RADIOACTIVE SEED GUIDED EXCISION AND AXILLARY SENTINEL LYMPH NODE BIOPSY Bilateral 05/19/2016   Procedure: RIGHT SKIN SPARING MASTECTOMY WITH RIGHT RADIOACTIVE SEED TARGETED DISSECTION AND RIGHT SENTINEL LYMPH NODE BIOPSY, LEFT PROPHYLACTIC SKIN SPARING MASTECTOMY;  Surgeon: Emelia Loron, MD;  Location: [facility];  Service: General;  Laterality: Bilateral;   REMOVAL OF BILATERAL TISSUE EXPANDERS WITH PLACEMENT OF BILATERAL BREAST IMPLANTS Bilateral 01/29/2017   Procedure: REMOVAL OF BILATERAL TISSUE EXPANDERS WITH PLACEMENT OF BILATERAL SILICONE BREAST IMPLANTS, ALLODERM TO LEFT BREAST RECONSTRUCTION;  RIGHT LATISSUMUS FLAP;  Surgeon: Glenna Fellows, MD;  Location: [facility];  Service: Plastics;  Laterality: Bilateral;  Requesting RNFA   SCAR REVISION Right 06/28/2017   Procedure: COMPLEX REVISION OF BACK SCAR;  Surgeon: Glenna Fellows, MD;  Location: [facility];  Service: Plastics;  Laterality: Right;   TONSILLECTOMY     TUBAL LIGATION     urologic surgery for  ureteropelvic junction obstruction      Current Outpatient Medications  Medication Sig Dispense Refill   acetaminophen (TYLENOL) 500 MG tablet Take 1,000 mg by mouth every 6 (six) hours as needed.     ALPRAZolam (XANAX) 1 MG tablet Take 1 tablet (1 mg total) by mouth 3 (three) times daily. 90 tablet 0   celecoxib (CELEBREX) 200 MG capsule Take 1 capsule (200 mg total) by mouth daily. (Patient taking differently: Take 200 mg by mouth as needed.) 30 capsule 2   cloNIDine (CATAPRES) 0.1 MG tablet TAKE 1 TABLET BY MOUTH EVERY DAY 90 tablet 1   levothyroxine (SYNTHROID, LEVOTHROID) 50 MCG tablet Take 50 mcg by mouth daily before breakfast.     venlafaxine XR (EFFEXOR-XR) 150 MG 24 hr capsule TAKE 1 CAPSULE BY MOUTH EVERY DAY WITH BREAKFAST 90 capsule 1   methocarbamol (ROBAXIN) 500 MG tablet Take 1 tablet (500 mg  total) by mouth every 8 (eight) hours as needed for muscle spasms. (Patient not taking: Reported on 02/18/2023) 20 tablet 0   omeprazole (PRILOSEC) 40 MG capsule TAKE ONE CAPSULE BY MOUTH EVERY DAY (Patient not taking: Reported on 02/18/2023) 30 capsule 4   venlafaxine XR (EFFEXOR-XR) 75 MG 24 hr capsule TAKE 1 CAPSULE BY MOUTH DAILY WITH BREAKFAST. (Patient not taking: Reported on 02/18/2023) 90 capsule 2   No current facility-administered medications for this visit.    Allergies as of 02/18/2023 - Review Complete 02/18/2023  Allergen Reaction Noted   Adhesive [tape] Dermatitis 10/25/2012   Promethazine hcl Itching and Nausea And Vomiting 06/14/2007   Sulfa antibiotics Rash 07/06/2019    Family History  Problem Relation Age of Onset   COPD Mother    Ovarian cancer Mother 64       surgery, no chemo   Kidney disease Mother    Diabetes Father    Heart disease Father    Hypertension Father    Colon cancer Father 69   Other Father        Amyloidosis   Cancer Father        2nd cancer later in life, type unk   Hypertension Sister    Depression Sister    Arthritis Sister    Von  Willebrand disease Sister    Rheum arthritis Maternal Grandmother    Breast cancer Paternal Grandmother        pt doesn't remember, but her sister says this grandmother had br cancer   Goiter Paternal Grandmother    Penile cancer Paternal Uncle 64   Esophageal cancer Neg Hx    Stomach cancer Neg Hx     Social History   Socioeconomic History   Marital status: Married    Spouse name: Not on file   Number of children: 2   Years of education: 14   Highest education level: Not on file  Occupational History   Occupation: Product/process development scientist, Scientist, water quality    Employer: [employer]  Tobacco Use   Smoking status: Former    Current packs/day: 0.00    Average packs/day: 0.5 packs/day for 15.0 years (7.5 ttl pk-yrs)    Types: Cigarettes    Start date: 04/08/2001    Quit date: 04/08/2016    Years since quitting: 6.8   Smokeless tobacco: Never  Substance and Sexual Activity   Alcohol use: Yes    Alcohol/week: 14.0 standard drinks of alcohol    Types: 12 Cans of beer, 2 Standard drinks or equivalent per week    Comment: moderate, daily    Drug use: No   Sexual activity: Yes    Partners: Male    Birth control/protection: Surgical  Other Topics Concern   Not on file  Social History Narrative   HSG, some community college. .Married -'81.   2 daughters  '83, '88 daughter  with bipolar dz. Has had behavior issues. 1 granddaughter '05 living with her. Occupation:Bank worker. Marriage in good. No history of abuse.                  Social Drivers of Corporate investment banker Strain: Not on file  Food Insecurity: Not on file  Transportation Needs: No Transportation Needs (02/13/2022)   PRAPARE - Administrator, Civil Service (Medical): No    Lack of Transportation (Non-Medical): No  Physical Activity: Not on file  Stress: Not on file  Social Connections: Unknown (07/08/2021)   [facility] from [facility], [facility]   Social Network    Social Network: Not on file   Intimate Partner Violence: Unknown (05/29/2021)   Received from [facility], [facility]   HITS    Physically Hurt: Not on file    Insult or Talk Down To: Not on file    Threaten Physical Harm: Not on file    Scream or Curse: Not on file    Review of Systems:    Constitutional: No fever or chills Skin: No rash  Cardiovascular: No chest pain Respiratory: No SOB Gastrointestinal: See HPI and otherwise negative Genitourinary: No dysuria  Neurological: No headache, dizziness or syncope Musculoskeletal: No new muscle or joint pain Hematologic: No bleeding  Psychiatric: No history of depression or anxiety   Physical Exam:  Vital signs: BP 126/82   Pulse 94   Ht 5\' 5"  (1.651 m)   Wt 142 lb (64.4 kg)   SpO2 96%   BMI 23.63 kg/m   Constitutional:   Pleasant Caucasian female appears to be in NAD, Well developed, Well nourished, alert and cooperative Head:  Normocephalic and atraumatic. Eyes:   PEERL, EOMI. No icterus. Conjunctiva pink. Ears:  Normal auditory acuity. Neck:  Supple Throat: Oral cavity and pharynx without inflammation, swelling or lesion.  Respiratory: Respirations even and unlabored. Lungs clear to auscultation bilaterally.   No wheezes, crackles, or rhonchi.  Cardiovascular: Normal S1, S2. No MRG. Regular rate and rhythm. No peripheral edema, cyanosis or pallor.  Gastrointestinal:  Soft, nondistended, nontender. No rebound or guarding. Normal bowel sounds. No appreciable masses or hepatomegaly. Rectal:  Not performed.  Msk:  Symmetrical without gross deformities. Without edema, no deformity or joint abnormality.  Neurologic:  Alert and  oriented x4;  grossly normal neurologically.  Skin:   Dry and intact without significant lesions or rashes. Psychiatric: ,Demonstrates good judgement and reason without abnormal affect or behaviors.  RELEVANT LABS AND IMAGING: CBC    Component Value Date/Time   WBC 5.7 05/18/2022 1345   WBC 4.5 09/18/2021 1057   RBC 4.69  05/18/2022 1345   HGB 14.0 05/18/2022 1345   HGB 11.2 (L) 02/19/2017 1304   HCT 41.8 05/18/2022 1345   HCT 34.9 02/19/2017 1304   PLT 267 05/18/2022 1345   PLT 302 02/19/2017 1304   MCV 89.1 05/18/2022 1345   MCV 84.2 02/19/2017 1304   MCH 29.9 05/18/2022 1345   MCHC 33.5 05/18/2022 1345   RDW 17.5 (H) 05/18/2022 1345   RDW 17.0 (H) 02/19/2017 1304   LYMPHSABS 2.2 05/18/2022 1345   LYMPHSABS 1.4 02/19/2017 1304   MONOABS 0.6 05/18/2022 1345   MONOABS 0.5 02/19/2017 1304   EOSABS 0.2 05/18/2022 1345   EOSABS 0.3 02/19/2017 1304   BASOSABS 0.1 05/18/2022 1345   BASOSABS 0.1 02/19/2017 1304    CMP     Component Value Date/Time   NA 140 05/18/2022 1345   NA 141 02/19/2017 1304   K 4.1 05/18/2022 1345   K 4.0 02/19/2017 1304   CL 106 05/18/2022 1345   CO2 25 05/18/2022 1345   CO2 26 02/19/2017 1304   GLUCOSE 112 (H) 05/18/2022 1345   GLUCOSE 99 02/19/2017 1304   BUN 9 05/18/2022 1345   BUN 16.4 02/19/2017 1304   CREATININE 0.92 05/18/2022 1345   CREATININE 0.9 02/19/2017 1304   CALCIUM 9.6 05/18/2022 1345   CALCIUM 8.8 02/19/2017 1304   PROT 6.9 05/18/2022 1345   PROT 6.2 (L) 02/19/2017 1304   ALBUMIN 4.7 05/18/2022 1345  ALBUMIN 3.6 02/19/2017 1304   AST 29 05/18/2022 1345   AST 22 02/19/2017 1304   ALT 28 05/18/2022 1345   ALT 23 02/19/2017 1304   ALKPHOS 73 05/18/2022 1345   ALKPHOS 88 02/19/2017 1304   BILITOT 0.4 05/18/2022 1345   BILITOT 0.39 02/19/2017 1304   GFRNONAA >60 05/18/2022 1345   GFRNONAA 72 11/06/2015 0831   GFRAA >60 10/12/2019 1445   GFRAA 83 11/06/2015 0831    Assessment: 1.  Dysphagia with history of esophageal stricture: History of esophageal stricture last dilated in 2017, trouble increasing over the past couple of months with reflux symptoms uncontrolled on Omeprazole 40 mg daily 2.  Family history of colon cancer: Last colonoscopy in 2017 with repeat recommended in 5 years, patient is overdue 3.  GERD: Uncontrolled on Omeprazole 40  mg daily  Plan: 1.  Scheduled patient for a diagnostic EGD with dilation in the [facility] with Dr. Marina Goodell.  Also scheduled her for a surveillance colonoscopy given family history of colon cancer.  Did provide the patient a detailed list of risks for the procedures and she agrees to proceed. Patient is appropriate for endoscopic procedure(s) in the ambulatory ([facility]) setting.  2.  Prescribed Reglan 10 mg x 2, 2030 minutes before each half-With no refills 3.  Increased Omeprazole to 40 mg twice daily, 30-60 minutes before breakfast and dinner.  #180 with 3 refills 4.  Patient to follow in clinic per recommendations after time of procedure.  Hyacinth Meeker, PA-C [facility] 02/18/2023, 9:58 AM  Cc: Fatima Sanger, FNP

## 2023-02-18 NOTE — Patient Instructions (Signed)
We have sent the following medications to your pharmacy for you to pick up at your convenience: Omeprazole 40 mg twice daily 30-60 minutes before breakfast and dinner.  Reglan 10 mg take 20-30 minutes before drinking colonoscopy prep.   You have been scheduled for an endoscopy and colonoscopy. Please follow the written instructions given to you at your visit today.  Please pick up your prep supplies at the pharmacy within the next 1-3 days.  If you use inhalers (even only as needed), please bring them with you on the day of your procedure.  DO NOT TAKE 7 DAYS PRIOR TO TEST- Trulicity (dulaglutide) Ozempic, Wegovy (semaglutide) Mounjaro (tirzepatide) Bydureon Bcise (exanatide extended release)  DO NOT TAKE 1 DAY PRIOR TO YOUR TEST Rybelsus (semaglutide) Adlyxin (lixisenatide) Victoza (liraglutide) Byetta (exanatide) ________________________________________________________________  _______________________________________________________  If your blood pressure at your visit was 140/90 or greater, please contact your primary care physician to follow up on this.  _______________________________________________________  If you are age 14 or older, your body mass index should be between 23-30. Your Body mass index is 23.63 kg/m. If this is out of the aforementioned range listed, please consider follow up with your Primary Care Provider.  If you are age 42 or younger, your body mass index should be between 19-25. Your Body mass index is 23.63 kg/m. If this is out of the aformentioned range listed, please consider follow up with your Primary Care Provider.   ________________________________________________________  The [facility] providers would like to encourage you to use [facility] to communicate with providers for non-urgent requests or questions.  Due to long hold times on the telephone, sending your provider a message by [facility] may be a faster and more efficient way to get a  response.  Please allow 48 business hours for a response.  Please remember that this is for non-urgent requests.  _______________________________________________________

## 2023-02-18 NOTE — Progress Notes (Signed)
Noted  

## 2023-03-10 ENCOUNTER — Other Ambulatory Visit: Payer: Self-pay | Admitting: Hematology

## 2023-03-10 DIAGNOSIS — T50905A Adverse effect of unspecified drugs, medicaments and biological substances, initial encounter: Secondary | ICD-10-CM

## 2023-03-29 ENCOUNTER — Encounter: Payer: Self-pay | Admitting: Internal Medicine

## 2023-04-01 ENCOUNTER — Telehealth: Payer: Self-pay | Admitting: Physician Assistant

## 2023-04-01 NOTE — Telephone Encounter (Signed)
 Patient called and stated that she is unable to eat or drink anything due to her getting food or liquid stuck in her throat. Patient also stated she at once weighing 178 is is now down to 140. Patient is wondering if this is going to effect her and her prep for her procedure. Patient is scheduled for a procedure on 04/06/2023. Patient as well stated that she is dehydrated and is also unsure as to weather the anesthesia would work for her due to her having one arm from being a cancer survivor. Patient is requesting a call back. Please advise.

## 2023-04-01 NOTE — Telephone Encounter (Signed)
 Returned call to patient. I informed patient that if she was having an issue with swallowing maybe she could do the EGD first then colonsocopy. Pt wishes to proceed with both procedures as scheduled. Pt requested medication for nausea to help her with prep. I informed pt that Reglan  was sent in at the time of her office visit, she is to take 1 tablet 30 minutes prior to each half of prep. Pt also wanted it to be noted that she is a very hard IV stick and they can only use her left arm due to hx of breast cancer. Patient also mentioned that she gets nauseated after sedation. I told pt that she can notify endoscopy team the day of her procedure and they will take care of her. Pt verbalized understanding and had no concerns at the end of the call.   Appt note updated with note that patient is a hard stick

## 2023-04-06 ENCOUNTER — Encounter: Payer: Self-pay | Admitting: Internal Medicine

## 2023-04-06 ENCOUNTER — Ambulatory Visit: Payer: BC Managed Care – PPO | Admitting: Internal Medicine

## 2023-04-06 VITALS — BP 121/71 | HR 80 | Temp 97.5°F | Resp 16 | Ht 65.0 in | Wt 142.0 lb

## 2023-04-06 DIAGNOSIS — Z800 Family history of malignant neoplasm of digestive organs: Secondary | ICD-10-CM | POA: Diagnosis not present

## 2023-04-06 DIAGNOSIS — K219 Gastro-esophageal reflux disease without esophagitis: Secondary | ICD-10-CM | POA: Diagnosis not present

## 2023-04-06 DIAGNOSIS — Z1211 Encounter for screening for malignant neoplasm of colon: Secondary | ICD-10-CM | POA: Diagnosis not present

## 2023-04-06 DIAGNOSIS — K449 Diaphragmatic hernia without obstruction or gangrene: Secondary | ICD-10-CM | POA: Diagnosis not present

## 2023-04-06 DIAGNOSIS — D122 Benign neoplasm of ascending colon: Secondary | ICD-10-CM | POA: Diagnosis not present

## 2023-04-06 DIAGNOSIS — K648 Other hemorrhoids: Secondary | ICD-10-CM | POA: Diagnosis not present

## 2023-04-06 DIAGNOSIS — D128 Benign neoplasm of rectum: Secondary | ICD-10-CM | POA: Diagnosis not present

## 2023-04-06 DIAGNOSIS — K5730 Diverticulosis of large intestine without perforation or abscess without bleeding: Secondary | ICD-10-CM

## 2023-04-06 DIAGNOSIS — K222 Esophageal obstruction: Secondary | ICD-10-CM

## 2023-04-06 DIAGNOSIS — K6389 Other specified diseases of intestine: Secondary | ICD-10-CM

## 2023-04-06 DIAGNOSIS — K3189 Other diseases of stomach and duodenum: Secondary | ICD-10-CM | POA: Diagnosis not present

## 2023-04-06 DIAGNOSIS — R1310 Dysphagia, unspecified: Secondary | ICD-10-CM

## 2023-04-06 DIAGNOSIS — K2100 Gastro-esophageal reflux disease with esophagitis, without bleeding: Secondary | ICD-10-CM

## 2023-04-06 DIAGNOSIS — Z8719 Personal history of other diseases of the digestive system: Secondary | ICD-10-CM

## 2023-04-06 MED ORDER — SODIUM CHLORIDE 0.9 % IV SOLN
500.0000 mL | Freq: Once | INTRAVENOUS | Status: DC
Start: 2023-04-06 — End: 2023-04-06

## 2023-04-06 NOTE — Progress Notes (Signed)
Called to room to assist during endoscopic procedure.  Patient ID and intended procedure confirmed with present staff. Received instructions for my participation in the procedure from the performing physician.

## 2023-04-06 NOTE — Progress Notes (Signed)
Expand All Collapse All    Chief Complaint: Family history of colon cancer and dysphagia   HPI:    [name] is a 63 y/o female with a past medical history as listed below including breast cancer, depression, CKD and multiple others, known to Dr. Marina Goodell, who was referred to me by Fatima Sanger, FNP for a complaint of family history of colon cancer and dysphagia.      2009 EGD and colonoscopy.  Colonoscopy with diverticulosis.  Follow-up recommended in 5 years given family history of colon cancer.  EGD with distal esophageal stricture which was dilated with a 16.5 mm balloon and small hiatal hernia.    08/23/2015 office visit with Dr. Marina Goodell, at that time discussed reflux complicated by peptic stricture and a family history of colon cancer for which she had already undergone colonoscopy.  At that time continuing with GERD despite medical therapy and dysphagia.  At that time using Ranitidine 150 twice daily.  She was scheduled for an EGD and colonoscopy.    09/27/2015 EGD with benign-appearing esophageal stenosis dilated with a 54 [place].  Continued on Meprazole 40 mg daily.    09/27/2015 colonoscopy with a 4 mm polyp in the ascending colon, diverticulosis in the sigmoid colon internal hemorrhoids.  Repeat recommended in 5 years.  Polyps were benign.  Repeat recommended in 5 years.    Today, the patient tells me that she seemed to be doing okay swallowing until about 2 months ago.  Now everything seems to give her trouble unless it is very soft, even water seems like it can get hung up, due to this feels like she is lost around 20 pounds over the past few months.  She knows she needs to be dilated again.  Currently on Omeprazole 40 mg daily but does not feel like it is working well, has occasionally taken 2.    Also describes she is due for colonoscopy given a family history of colon cancer.    Denies fever, chills, nausea, vomiting or abdominal pain.       Past Medical History:  Diagnosis Date    Anxiety     Anxiety disorder     Asthma      h/o asthma as a child   Breast cancer (HCC)     Cancer (HCC) 04/2016    right breast cancer   Cholelithiasis     Chronic kidney disease      obstruction of R kidney, ( not a stone) - currently resolved    Depression     Diverticulosis     DJD (degenerative joint disease)      hands & back    Dyspnea      resolved since she stopped smoking    Epigastric abdominal pain     Esophageal stricture     Family history of adverse reaction to anesthesia      daughter has N&V, takes long time to wake up    Family history of colon cancer     Family history of ovarian cancer     Family hx of colon cancer     Female pelvic peritoneal adhesions 10/26/2012   Ganglion cyst     GERD (gastroesophageal reflux disease)     Headache      low grade currently , family history of migraines    Hemorrhoid     History of radiation therapy 05/03 - 08/11/2016    1. 4 field Right breast was treated to 50.4 Gy in  25 fractions at 1.8 Gy per fraction. 2. The Right breast was boosted to 10 Gy in 5 fractions at 2 Gy per fraction.   Hypothyroidism     Plantar fasciitis, bilateral     PONV (postoperative nausea and vomiting)      gets anxious with the mask on her face, also remarks that the scop. patch has helped in the past               Past Surgical History:  Procedure Laterality Date   ABDOMINAL HYSTERECTOMY       BREAST IMPLANT REMOVAL Bilateral 01/24/2019    Procedure: REMOVAL BREAST IMPLANTS;  Surgeon: Glenna Fellows, MD;  Location: [facility];  Service: Plastics;  Laterality: Bilateral;   BREAST RECONSTRUCTION WITH PLACEMENT OF TISSUE EXPANDER AND FLEX HD (ACELLULAR HYDRATED DERMIS) Bilateral 05/19/2016    Procedure: BREAST RECONSTRUCTION WITH PLACEMENT OF TISSUE EXPANDER AND ALLODERM PLACEMENT;  Surgeon: Glenna Fellows, MD;  Location: [facility];  Service: Plastics;  Laterality: Bilateral;   BREAST RECONSTRUCTION WITH  PLACEMENT OF TISSUE EXPANDER AND FLEX HD (ACELLULAR HYDRATED DERMIS) Left 06/28/2017    Procedure: LEFT BREAST RECONSTRUCTION WITH SILICONE IMPLANT EXCHANGE AND ACELLULARDERMIS TO LEFT CHEST;  Surgeon: Glenna Fellows, MD;  Location: [facility];  Service: Plastics;  Laterality: Left;   CAPSULECTOMY Bilateral 01/24/2019    Procedure: CAPSULECTOMY;  Surgeon: Glenna Fellows, MD;  Location: [facility];  Service: Plastics;  Laterality: Bilateral;   CESAREAN SECTION   1988   CHOLECYSTECTOMY       HEMORRHOID SURGERY       LAPAROSCOPIC BILATERAL SALPINGECTOMY N/A 10/26/2012    Procedure: operative laparoscopy with lysis of adhesions;  Surgeon: Sherron Monday, MD;  Location: [facility];  Service: Gynecology;  Laterality: N/A;   LESION EXCISION WITH COMPLEX REPAIR Right 06/09/2019    Procedure: COMPLEX REPAIR RIGHT CHEST 15CM;  Surgeon: Glenna Fellows, MD;  Location: [facility];  Service: Plastics;  Laterality: Right;   MASTECTOMY Bilateral     MASTECTOMY WITH RADIOACTIVE SEED GUIDED EXCISION AND AXILLARY SENTINEL LYMPH NODE BIOPSY Bilateral 05/19/2016    Procedure: RIGHT SKIN SPARING MASTECTOMY WITH RIGHT RADIOACTIVE SEED TARGETED DISSECTION AND RIGHT SENTINEL LYMPH NODE BIOPSY, LEFT PROPHYLACTIC SKIN SPARING MASTECTOMY;  Surgeon: Emelia Loron, MD;  Location: [facility];  Service: General;  Laterality: Bilateral;   REMOVAL OF BILATERAL TISSUE EXPANDERS WITH PLACEMENT OF BILATERAL BREAST IMPLANTS Bilateral 01/29/2017    Procedure: REMOVAL OF BILATERAL TISSUE EXPANDERS WITH PLACEMENT OF BILATERAL SILICONE BREAST IMPLANTS, ALLODERM TO LEFT BREAST RECONSTRUCTION;  RIGHT LATISSUMUS FLAP;  Surgeon: Glenna Fellows, MD;  Location: [facility];  Service: Plastics;  Laterality: Bilateral;  Requesting RNFA   SCAR REVISION Right 06/28/2017    Procedure: COMPLEX REVISION OF BACK SCAR;  Surgeon: Glenna Fellows, MD;  Location: [facility];  Service:  Plastics;  Laterality: Right;   TONSILLECTOMY       TUBAL LIGATION       urologic surgery for ureteropelvic junction obstruction                    Current Outpatient Medications  Medication Sig Dispense Refill   acetaminophen (TYLENOL) 500 MG tablet Take 1,000 mg by mouth every 6 (six) hours as needed.       ALPRAZolam (XANAX) 1 MG tablet Take 1 tablet (1 mg total) by mouth 3 (three) times daily. 90 tablet 0   celecoxib (CELEBREX) 200 MG capsule Take 1  capsule (200 mg total) by mouth daily. (Patient taking differently: Take 200 mg by mouth as needed.) 30 capsule 2   cloNIDine (CATAPRES) 0.1 MG tablet TAKE 1 TABLET BY MOUTH EVERY DAY 90 tablet 1   levothyroxine (SYNTHROID, LEVOTHROID) 50 MCG tablet Take 50 mcg by mouth daily before breakfast.       venlafaxine XR (EFFEXOR-XR) 150 MG 24 hr capsule TAKE 1 CAPSULE BY MOUTH EVERY DAY WITH BREAKFAST 90 capsule 1   methocarbamol (ROBAXIN) 500 MG tablet Take 1 tablet (500 mg total) by mouth every 8 (eight) hours as needed for muscle spasms. (Patient not taking: Reported on 02/18/2023) 20 tablet 0   omeprazole (PRILOSEC) 40 MG capsule TAKE ONE CAPSULE BY MOUTH EVERY DAY (Patient not taking: Reported on 02/18/2023) 30 capsule 4   venlafaxine XR (EFFEXOR-XR) 75 MG 24 hr capsule TAKE 1 CAPSULE BY MOUTH DAILY WITH BREAKFAST. (Patient not taking: Reported on 02/18/2023) 90 capsule 2      No current facility-administered medications for this visit.             Allergies as of 02/18/2023 - Review Complete 02/18/2023  Allergen Reaction Noted   Adhesive [tape] Dermatitis 10/25/2012   Promethazine hcl Itching and Nausea And Vomiting 06/14/2007   Sulfa antibiotics Rash 07/06/2019           Family History  Problem Relation Age of Onset   COPD Mother     Ovarian cancer Mother 45        surgery, no chemo   Kidney disease Mother     Diabetes Father     Heart disease Father     Hypertension Father     Colon cancer Father 90   Other Father           Amyloidosis   Cancer Father          2nd cancer later in life, type unk   Hypertension Sister     Depression Sister     Arthritis Sister     Von Willebrand disease Sister     Rheum arthritis Maternal Grandmother     Breast cancer Paternal Grandmother          pt doesn't remember, but her sister says this grandmother had br cancer   Goiter Paternal Grandmother     Penile cancer Paternal Uncle 72   Esophageal cancer Neg Hx     Stomach cancer Neg Hx            Social History         Socioeconomic History   Marital status: Married      Spouse name: Not on file   Number of children: 2   Years of education: 14   Highest education level: Not on file  Occupational History   Occupation: Product/process development scientist, Scientist, water quality      Employer: [employer]  Tobacco Use   Smoking status: Former      Current packs/day: 0.00      Average packs/day: 0.5 packs/day for 15.0 years (7.5 ttl pk-yrs)      Types: Cigarettes      Start date: 04/08/2001      Quit date: 04/08/2016      Years since quitting: 6.8   Smokeless tobacco: Never  Substance and Sexual Activity   Alcohol use: Yes      Alcohol/week: 14.0 standard drinks of alcohol      Types: 12 Cans of beer, 2 Standard drinks or equivalent per week  Comment: moderate, daily    Drug use: No   Sexual activity: Yes      Partners: Male      Birth control/protection: Surgical  Other Topics Concern   Not on file  Social History Narrative    HSG, some community college. .Married -'81.   2 daughters  '83, '88 daughter  with bipolar dz. Has had behavior issues. 1 granddaughter '05 living with her. Occupation:Bank worker. Marriage in good. No history of abuse.                             Social Drivers of Acupuncturist Strain: Not on file  Food Insecurity: Not on file  Transportation Needs: No Transportation Needs (02/13/2022)    PRAPARE - Therapist, art (Medical): No     Lack of  Transportation (Non-Medical): No  Physical Activity: Not on file  Stress: Not on file  Social Connections: Unknown (07/08/2021)    Received from [facility], [facility]    Social Network     Social Network: Not on file  Intimate Partner Violence: Unknown (05/29/2021)    Received from [facility], [facility]    HITS     Physically Hurt: Not on file     Insult or Talk Down To: Not on file     Threaten Physical Harm: Not on file     Scream or Curse: Not on file      Review of Systems:    Constitutional: No fever or chills Skin: No rash  Cardiovascular: No chest pain Respiratory: No SOB Gastrointestinal: See HPI and otherwise negative Genitourinary: No dysuria  Neurological: No headache, dizziness or syncope Musculoskeletal: No new muscle or joint pain Hematologic: No bleeding  Psychiatric: No history of depression or anxiety    Physical Exam:  Vital signs: BP 126/82   Pulse 94   Ht 5\' 5"  (1.651 m)   Wt 142 lb (64.4 kg)   SpO2 96%   BMI 23.63 kg/m    Constitutional:   Pleasant Caucasian female appears to be in NAD, Well developed, Well nourished, alert and cooperative Head:  Normocephalic and atraumatic. Eyes:   PEERL, EOMI. No icterus. Conjunctiva pink. Ears:  Normal auditory acuity. Neck:  Supple Throat: Oral cavity and pharynx without inflammation, swelling or lesion.  Respiratory: Respirations even and unlabored. Lungs clear to auscultation bilaterally.   No wheezes, crackles, or rhonchi.  Cardiovascular: Normal S1, S2. No MRG. Regular rate and rhythm. No peripheral edema, cyanosis or pallor.  Gastrointestinal:  Soft, nondistended, nontender. No rebound or guarding. Normal bowel sounds. No appreciable masses or hepatomegaly. Rectal:  Not performed.  Msk:  Symmetrical without gross deformities. Without edema, no deformity or joint abnormality.  Neurologic:  Alert and  oriented x4;  grossly normal neurologically.  Skin:   Dry and intact without significant  lesions or rashes. Psychiatric: ,Demonstrates good judgement and reason without abnormal affect or behaviors.   RELEVANT LABS AND IMAGING: CBC Labs (Brief)          Component Value Date/Time    WBC 5.7 05/18/2022 1345    WBC 4.5 09/18/2021 1057    RBC 4.69 05/18/2022 1345    HGB 14.0 05/18/2022 1345    HGB 11.2 (L) 02/19/2017 1304    HCT 41.8 05/18/2022 1345    HCT 34.9 02/19/2017 1304    PLT 267 05/18/2022 1345  PLT 302 02/19/2017 1304    MCV 89.1 05/18/2022 1345    MCV 84.2 02/19/2017 1304    MCH 29.9 05/18/2022 1345    MCHC 33.5 05/18/2022 1345    RDW 17.5 (H) 05/18/2022 1345    RDW 17.0 (H) 02/19/2017 1304    LYMPHSABS 2.2 05/18/2022 1345    LYMPHSABS 1.4 02/19/2017 1304    MONOABS 0.6 05/18/2022 1345    MONOABS 0.5 02/19/2017 1304    EOSABS 0.2 05/18/2022 1345    EOSABS 0.3 02/19/2017 1304    BASOSABS 0.1 05/18/2022 1345    BASOSABS 0.1 02/19/2017 1304        CMP     Labs (Brief)          Component Value Date/Time    NA 140 05/18/2022 1345    NA 141 02/19/2017 1304    K 4.1 05/18/2022 1345    K 4.0 02/19/2017 1304    CL 106 05/18/2022 1345    CO2 25 05/18/2022 1345    CO2 26 02/19/2017 1304    GLUCOSE 112 (H) 05/18/2022 1345    GLUCOSE 99 02/19/2017 1304    BUN 9 05/18/2022 1345    BUN 16.4 02/19/2017 1304    CREATININE 0.92 05/18/2022 1345    CREATININE 0.9 02/19/2017 1304    CALCIUM 9.6 05/18/2022 1345    CALCIUM 8.8 02/19/2017 1304    PROT 6.9 05/18/2022 1345    PROT 6.2 (L) 02/19/2017 1304    ALBUMIN 4.7 05/18/2022 1345    ALBUMIN 3.6 02/19/2017 1304    AST 29 05/18/2022 1345    AST 22 02/19/2017 1304    ALT 28 05/18/2022 1345    ALT 23 02/19/2017 1304    ALKPHOS 73 05/18/2022 1345    ALKPHOS 88 02/19/2017 1304    BILITOT 0.4 05/18/2022 1345    BILITOT 0.39 02/19/2017 1304    GFRNONAA >60 05/18/2022 1345    GFRNONAA 72 11/06/2015 0831    GFRAA >60 10/12/2019 1445    GFRAA 83 11/06/2015 0831        Assessment: 1.  Dysphagia with  history of esophageal stricture: History of esophageal stricture last dilated in 2017, trouble increasing over the past couple of months with reflux symptoms uncontrolled on Omeprazole 40 mg daily 2.  Family history of colon cancer: Last colonoscopy in 2017 with repeat recommended in 5 years, patient is overdue 3.  GERD: Uncontrolled on Omeprazole 40 mg daily   Plan: 1.  Scheduled patient for a diagnostic EGD with dilation in the [facility] with Dr. Marina Goodell.  Also scheduled her for a surveillance colonoscopy given family history of colon cancer.  Did provide the patient a detailed list of risks for the procedures and she agrees to proceed. Patient is appropriate for endoscopic procedure(s) in the ambulatory ([facility]) setting.  2.  Prescribed Reglan 10 mg x 2, 2030 minutes before each half-With no refills 3.  Increased Omeprazole to 40 mg twice daily, 30-60 minutes before breakfast and dinner.  #180 with 3 refills 4.  Patient to follow in clinic per recommendations after time of procedure.   Hyacinth Meeker, PA-C [facility] 02/18/2023, 9:58 AM   Cc: Fatima Sanger, FNP   Recent office visit with complete H&P as above.  No interval change.  Now for colonoscopy and upper endoscopy with esophageal dilation

## 2023-04-06 NOTE — Op Note (Signed)
[facility] Patient Name: [name] Procedure Date: 04/06/2023 8:52 AM MRN: [id number] Endoscopist: Wilhemina Bonito. Marina Goodell , MD, [id number] Age: 63 Referring MD:  Date of Birth: [birth year] Gender: Female Account #: [id number] Procedure:                Upper GI endoscopy with balloon dilation of the                            esophagus?"20 mm; biopsies Indications:              Dysphagia, Weight loss Medicines:                Monitored Anesthesia Care Procedure:                Pre-Anesthesia Assessment:                           - Prior to the procedure, a History and Physical                            was performed, and patient medications and                            allergies were reviewed. The patient's tolerance of                            previous anesthesia was also reviewed. The risks                            and benefits of the procedure and the sedation                            options and risks were discussed with the patient.                            All questions were answered, and informed consent                            was obtained. Prior Anticoagulants: The patient has                            taken no anticoagulant or antiplatelet agents.                            After reviewing the risks and benefits, the patient                            was deemed in satisfactory condition to undergo the                            procedure.                           After obtaining informed consent, the endoscope was  passed under direct vision. Throughout the                            procedure, the patient's blood pressure, pulse, and                            oxygen saturations were monitored continuously. The                            GIF [id number] #[id number] was introduced through the                            mouth, and advanced to the second part of duodenum.                            The upper GI endoscopy was accomplished  without                            difficulty. The patient tolerated the procedure                            well. Scope In: Scope Out: Findings:                 The esophagus was slightly dilated and mildly                            tortuous. There was a 15 mm ringlike stricture at                            the gastroesophageal junction. A TTS dilator was                            passed through the scope. Dilation with an 18-19-20                            mm balloon dilator was performed to 20 mm. There                            was minimal mucosal ring disruption.                           The stomach revealed thickened folds in the                            proximal half. Biopsies were taken with a cold                            forceps for histology. The distal stomach revealed                            mild erythema but was otherwise normal  The examined duodenum was normal.                           The cardia and gastric fundus were normal on                            retroflexion, save hiatal hernia. Complications:            No immediate complications. Estimated Blood Loss:     Estimated blood loss: none. Impression:               1. Esophageal stricture status post dilation                           2. Thickened gastric folds status post biopsies                           3. GERD                           4. Significant weight loss. Recommendation:           - Patient has a contact number available for                            emergencies. The signs and symptoms of potential                            delayed complications were discussed with the                            patient. Return to normal activities tomorrow.                            Written discharge instructions were provided to the                            patient.                           - Post dilation diet.                           - Continue present medications.  Continue                            pantoprazole 40 mg twice daily                           - Await pathology results.                           - PLEASE SCHEDULE contrast-enhanced CT scan of the                            chest, abdomen, and pelvis "30 pound weight loss,  evaluate".                           - Office follow-up with Dr. Marina Goodell in 4 to 6 weeks Wilhemina Bonito. Marina Goodell, MD 04/06/2023 10:06:12 AM This report has been signed electronically.

## 2023-04-06 NOTE — Progress Notes (Signed)
A/O x 3, gd SR's, VSS, report to RN

## 2023-04-06 NOTE — Patient Instructions (Signed)
  Educational handout provided to patient related to Hemorrhoids, Polyps, and Diverticulosis, STRICTURES, POST DILATION DIET  POST DILATION DIET  Continue present medications  Awaiting pathology results   YOU HAD AN ENDOSCOPIC PROCEDURE TODAY AT THE [facility]:   Refer to the procedure report that was given to you for any specific questions about what was found during the examination.  If the procedure report does not answer your questions, please call your gastroenterologist to clarify.  If you requested that your care partner not be given the details of your procedure findings, then the procedure report has been included in a sealed envelope for you to review at your convenience later.  YOU SHOULD EXPECT: Some feelings of bloating in the abdomen. Passage of more gas than usual.  Walking can help get rid of the air that was put into your GI tract during the procedure and reduce the bloating. If you had a lower endoscopy (such as a colonoscopy or flexible sigmoidoscopy) you may notice spotting of blood in your stool or on the toilet paper. If you underwent a bowel prep for your procedure, you may not have a normal bowel movement for a few days.  Please Note:  You might notice some irritation and congestion in your nose or some drainage.  This is from the oxygen used during your procedure.  There is no need for concern and it should clear up in a day or so.  SYMPTOMS TO REPORT IMMEDIATELY:  Following lower endoscopy (colonoscopy or flexible sigmoidoscopy):  Excessive amounts of blood in the stool  Significant tenderness or worsening of abdominal pains  Swelling of the abdomen that is new, acute  Fever of 100F or higher  Following upper endoscopy (EGD)  Vomiting of blood or coffee ground material  New chest pain or pain under the shoulder blades  Painful or persistently difficult swallowing  New shortness of breath  Fever of 100F or higher  Black, tarry-looking  stools  For urgent or emergent issues, a gastroenterologist can be reached at any hour by calling [phone]. Do not use MyChart messaging for urgent concerns.    DIET:  We do recommend a small meal at first, but then you may proceed to your regular diet.  Drink plenty of fluids but you should avoid alcoholic beverages for 24 hours.  ACTIVITY:  You should plan to take it easy for the rest of today and you should NOT DRIVE or use heavy machinery until tomorrow (because of the sedation medicines used during the test).    FOLLOW UP: Our staff will call the number listed on your records the next business day following your procedure.  We will call around 7:15- 8:00 am to check on you and address any questions or concerns that you may have regarding the information given to you following your procedure. If we do not reach you, we will leave a message.     If any biopsies were taken you will be contacted by phone or by letter within the next 1-3 weeks.  Please call us at [phone] if you have not heard about the biopsies in 3 weeks.    SIGNATURES/CONFIDENTIALITY: You and/or your care partner have signed paperwork which will be entered into your electronic medical record.  These signatures attest to the fact that that the information above on your After Visit Summary has been reviewed and is understood.  Full responsibility of the confidentiality of this discharge information lies with you and/or your care-partner.

## 2023-04-06 NOTE — Op Note (Signed)
[facility] Patient Name: [name] Procedure Date: 04/06/2023 9:02 AM MRN: [id number] Endoscopist: Wilhemina Bonito. Marina Goodell , MD, [id number] Age: 63 Referring MD:  Date of Birth: 1960/11/30 Gender: Female Account #: [id number] Procedure:                Colonoscopy with cold snare polypectomy x 2; biopsy                            polypectomy x 1 Indications:              Screening in patient at increased risk: Colorectal                            cancer in father before age 30. Previous                            examinations 2009 and 2017 were negative for                            neoplasia Medicines:                Monitored Anesthesia Care Procedure:                Pre-Anesthesia Assessment:                           - Prior to the procedure, a History and Physical                            was performed, and patient medications and                            allergies were reviewed. The patient's tolerance of                            previous anesthesia was also reviewed. The risks                            and benefits of the procedure and the sedation                            options and risks were discussed with the patient.                            All questions were answered, and informed consent                            was obtained. Prior Anticoagulants: The patient has                            taken no anticoagulant or antiplatelet agents. ASA                            Grade Assessment: II - A patient with mild systemic  disease. After reviewing the risks and benefits,                            the patient was deemed in satisfactory condition to                            undergo the procedure.                           After obtaining informed consent, the colonoscope                            was passed under direct vision. Throughout the                            procedure, the patient's blood pressure, pulse, and                             oxygen saturations were monitored continuously. The                            CF [id number] #[id number] was introduced through the anus                            and advanced to the the cecum, identified by                            appendiceal orifice and ileocecal valve. The                            ileocecal valve, appendiceal orifice, and rectum                            were photographed. The quality of the bowel                            preparation was adequate to identify polyps. The                            colonoscopy was performed without difficulty. The                            patient tolerated the procedure well. The bowel                            preparation used was SUPREP via split dose                            instruction. Did not complete the entire prep.Marland Kitchen Scope In: 9:24:07 AM Scope Out: 9:43:35 AM Scope Withdrawal Time: 0 hours 15 minutes 5 seconds  Total Procedure Duration: 0 hours 19 minutes 28 seconds  Findings:                 Two polyps were found in the ascending colon. The  polyps were 2 to 3 mm in size. These polyps were                            removed with a cold snare. Resection and retrieval                            were complete.                           A less than 1 mm polyp was found in the rectum. The                            polyp was removed with a jumbo cold forceps.                            Resection and retrieval were complete.                           Diverticula were found in the sigmoid colon. Mild                            melanosis coli.                           Internal hemorrhoids were found during retroflexion.                           The exam was otherwise without abnormality on                            direct and retroflexion views. Complications:            No immediate complications. Estimated blood loss:                            None. Estimated Blood Loss:      Estimated blood loss: none. Impression:               - Two 2 to 3 mm polyps in the ascending colon,                            removed with a cold snare. Resected and retrieved.                           - One less than 1 mm polyp in the rectum, removed                            with a jumbo cold forceps. Resected and retrieved.                           - Diverticulosis in the sigmoid colon. Mild                            melanosis coli.                           -  Internal hemorrhoids.                           - The examination was otherwise normal on direct                            and retroflexion views. Recommendation:           - Repeat colonoscopy in 5 years for surveillance.                           - Patient has a contact number available for                            emergencies. The signs and symptoms of potential                            delayed complications were discussed with the                            patient. Return to normal activities tomorrow.                            Written discharge instructions were provided to the                            patient.                           - Resume previous diet.                           - Continue present medications.                           - Await pathology results. Wilhemina Bonito. Marina Goodell, MD 04/06/2023 9:47:34 AM This report has been signed electronically.

## 2023-04-07 ENCOUNTER — Telehealth: Payer: Self-pay | Admitting: *Deleted

## 2023-04-07 NOTE — Telephone Encounter (Signed)
Post procedure follow up call placed, no answer and no VM.

## 2023-04-08 ENCOUNTER — Other Ambulatory Visit: Payer: Self-pay

## 2023-04-08 ENCOUNTER — Encounter: Payer: Self-pay | Admitting: Internal Medicine

## 2023-04-08 DIAGNOSIS — R634 Abnormal weight loss: Secondary | ICD-10-CM

## 2023-04-08 LAB — SURGICAL PATHOLOGY

## 2023-04-17 ENCOUNTER — Ambulatory Visit (HOSPITAL_COMMUNITY)
Admission: RE | Admit: 2023-04-17 | Discharge: 2023-04-17 | Disposition: A | Discharge status: Home / Self Care | Payer: BC Managed Care – PPO | Source: Ambulatory Visit | Attending: Internal Medicine | Admitting: Internal Medicine

## 2023-04-17 DIAGNOSIS — R634 Abnormal weight loss: Secondary | ICD-10-CM | POA: Diagnosis not present

## 2023-04-17 DIAGNOSIS — C50919 Malignant neoplasm of unspecified site of unspecified female breast: Secondary | ICD-10-CM | POA: Diagnosis not present

## 2023-04-17 DIAGNOSIS — Z90710 Acquired absence of both cervix and uterus: Secondary | ICD-10-CM | POA: Diagnosis not present

## 2023-04-17 DIAGNOSIS — Z9049 Acquired absence of other specified parts of digestive tract: Secondary | ICD-10-CM | POA: Diagnosis not present

## 2023-04-17 MED ORDER — IOHEXOL 300 MG/ML  SOLN
100.0000 mL | Freq: Once | INTRAMUSCULAR | Status: AC | PRN
  Administered 2023-04-17: 100 mL via INTRAVENOUS

## 2023-04-19 ENCOUNTER — Other Ambulatory Visit: Payer: Self-pay | Admitting: *Deleted

## 2023-04-19 ENCOUNTER — Encounter: Payer: Self-pay | Admitting: Internal Medicine

## 2023-04-19 MED ORDER — ONDANSETRON HCL 4 MG PO TABS: 4.0000 mg | ORAL_TABLET | ORAL | 2 refills | Status: DC | PRN

## 2023-04-23 ENCOUNTER — Telehealth: Payer: Self-pay | Admitting: Nurse Practitioner

## 2023-04-23 ENCOUNTER — Telehealth: Payer: Self-pay

## 2023-04-23 NOTE — Telephone Encounter (Signed)
 Received telephone call from patient regarding CT Scan ordered x  Dr. Marina Goodell.  Patient requesting for Dr. Mosetta Putt to look over ct scan and give her thoughts.  Let patient know that her message would be forwarded to Dr. Mosetta Putt and someone would follow-up with her. Patient verbalized understanding.

## 2023-04-23 NOTE — Telephone Encounter (Signed)
 Patient is aware of scheduled appointment times/dates for follow up and labs

## 2023-05-06 ENCOUNTER — Other Ambulatory Visit: Payer: Self-pay | Admitting: Nurse Practitioner

## 2023-05-06 DIAGNOSIS — C50811 Malignant neoplasm of overlapping sites of right female breast: Secondary | ICD-10-CM

## 2023-05-06 NOTE — Progress Notes (Signed)
 Patient Care Team: Fatima Sanger, FNP as PCP - General (Internal Medicine) Aplington, Illene Labrador, MD (Inactive) (Orthopedic Surgery) Jethro Bolus, MD (Ophthalmology) Janalyn Harder, MD (Inactive) (Dermatology) Malachy Mood, MD as Consulting Physician (Hematology) Emelia Loron, MD as Consulting Physician (General Surgery) Dorothy Puffer, MD as Consulting Physician (Radiation Oncology) Loa Socks, NP as Nurse Practitioner (Hematology and Oncology)  Clinic Day:  05/09/2023  Referring physician: Fatima Sanger, FNP  ASSESSMENT & PLAN:   Assessment & Plan: Breast cancer, right [facility]) History of right breast cancer.  Bilateral total mastectomy.  Due to recent symptoms of nausea, decreased appetite, GERD, and unexplained weight loss, patient saw GI provider.  CT scan CAP showing new bony lesions of thoracic and sacral spine.  Bony lesion of right humerus head also.  Nuclear medicine whole-body bone scan ordered today.  Follow-up with Dr. Mosetta Putt after results of bone scan available.    The patient understands the plans discussed today and is in agreement with them.  She knows to contact our office if she develops concerns prior to her next appointment.  I provided 30 minutes of face-to-face time during this encounter and > 50% was spent counseling as documented under my assessment and plan.    Carlean Jews, NP  [facility] - A DEPT OF [facility] [address] [place] [address] Dept: [phone] Dept Fax: [phone]   Orders Placed This Encounter  Procedures   NM Bone Scan Whole Body    Urgent please    Standing Status:   Future    Expected Date:   05/14/2023    Expiration Date:   05/06/2024    If indicated for the ordered procedure, I authorize the administration of a radiopharmaceutical per Radiology protocol:   Yes    Preferred imaging location?:   [facility]      CHIEF  COMPLAINT:  CC: Right breast cancer, estrogen receptor positive  Current Treatment: Surveillance  INTERVAL HISTORY:  Kyndell is here today for repeat clinical assessment.  Last visit was with Mardella Layman, NP in March 2024.  She had a CT CAP with contrast on 04/17/2023.  This showed nodular foci of sclerosis in the right humeral head, T4 vertebral body, T8 inferior endplate, T9 vertebral body, and S1 vertebral body.  Due to history of breast cancer, this is concerning for osseous metastasis.  Will order nuclear medicine whole-body bone scan for further evaluation.  She denies chest pain, chest pressure, or shortness of breath. She denies headaches or visual disturbances. She denies abdominal pain, nausea, vomiting, or changes in bowel or bladder habits.  She denies fevers or chills. She denies pain. Her appetite is poor. Her weight has decreased 20 pounds over last 2 months .  I have reviewed the past medical history, past surgical history, social history and family history with the patient and they are unchanged from previous note.  ALLERGIES:  is allergic to adhesive [tape], promethazine hcl, and sulfa antibiotics.  MEDICATIONS:  Current Outpatient Medications  Medication Sig Dispense Refill   acetaminophen (TYLENOL) 500 MG tablet Take 1,000 mg by mouth every 6 (six) hours as needed.     ALPRAZolam (XANAX) 1 MG tablet Take 1 tablet by mouth 3 (three) times daily.     celecoxib (CELEBREX) 200 MG capsule Take 1 capsule (200 mg total) by mouth daily. (Patient taking differently: Take 200 mg by mouth as needed.) 30 capsule 2  cloNIDine (CATAPRES) 0.1 MG tablet TAKE 1 TABLET BY MOUTH EVERY DAY 90 tablet 1   gabapentin (NEURONTIN) 100 MG capsule Take 1 capsule every day by oral route at dinner.     levothyroxine (SYNTHROID, LEVOTHROID) 50 MCG tablet Take 50 mcg by mouth daily before breakfast.     omeprazole (PRILOSEC) 40 MG capsule Take 1 capsule (40 mg total) by mouth 2 (two) times daily. 180 capsule  2   venlafaxine XR (EFFEXOR-XR) 150 MG 24 hr capsule TAKE 1 CAPSULE BY MOUTH EVERY DAY WITH BREAKFAST 90 capsule 1   venlafaxine XR (EFFEXOR-XR) 75 MG 24 hr capsule TAKE 1 CAPSULE BY MOUTH DAILY WITH BREAKFAST. 90 capsule 2   zolpidem (AMBIEN) 5 MG tablet TAKE 1 TABLET BY MOUTH EVERYDAY AT BEDTIME     ondansetron (ZOFRAN) 4 MG tablet Take 1 tablet (4 mg total) by mouth every 4 (four) hours as needed for nausea or vomiting. 30 tablet 2   No current facility-administered medications for this visit.    HISTORY OF PRESENT ILLNESS:   Oncology History Overview Note  Cancer Staging Malignant neoplasm of overlapping sites of right breast in female, estrogen receptor positive (HCC) Staging form: Breast, AJCC 8th Edition - Clinical stage from 03/23/2016: Stage IIA (cT3, cN1, cM0, G2, ER: Positive, PR: Positive, HER2: Negative) - Signed by Malachy Mood, MD on 03/31/2016 - Pathologic stage from 05/19/2016: Stage IA (pT2(m), pN1a, cM0, G1, ER: Positive, PR: Positive, HER2: Negative) - Signed by Malachy Mood, MD on 06/18/2016     Malignant neoplasm of overlapping sites of right breast in female, estrogen receptor positive (HCC)  03/17/2016 Mammogram   B/l diagnostic mammogram and righ US showed a 3.6cm irregular mass in the right breast 11:00 position, posterior depth, there is a enlarged lymph node in the right axilla is highly suspicious for malignancy. additional 7 mm oval mass in the right breast lower outer quadrant is suspicious for malignancy.   03/23/2016 Initial Biopsy   Right breast 9:30 position biopsy showed invasive ductal carcinoma, grade 1. 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   03/23/2016 Receptors her2   Both breast and node biopsy tumor ER 100% positive, PR 70-95% positive, HER-2 negative, Ki-67 40%   03/23/2016 Initial Diagnosis   Malignant neoplasm of upper-outer quadrant of right breast in female, estrogen receptor positive (HCC)   03/25/2016 Initial Biopsy    Right breast 11:00 position core needle biopsy showed invasive duct carcinoma, grade 2.    03/25/2016 Receptors her2   ER 95% positive, PR 90% positive, HER-2 negative, Ki-67 15%   03/25/2016 Miscellaneous   Mammaprint showed low risk type A with index +0.105   03/30/2016 Imaging   Bilateral breast MRI with and without contrast showed a large lobulated enhancing mass within the upper-outer and lower outer right breast with surrounding nodularity, measuring 6.1 x 4.4 x 5.6 cm. Multiple critically sick and right axillary lymph nodes are demonstrated measuring up to 1.5 cm.    04/01/2016 -  Anti-estrogen oral therapy   Exemestane 25 mg daily, plan for 7 years. Switched to Anastrozole 1mg  in 06/2017 due to joint pain and hot flashes. Due to persistent side effects I changed her to Tamoxifen in 09/2018. She stopped Tamoxifen and switched back to anastrozole in 01/2019 because it was more tolerable.    05/19/2016 Surgery   Bilateral mastectomy and right axillary regional lymph node resection.   05/19/2016 Pathology Results   -Right axillary regional lymph node resection revealed metastatic carcinoma in  2/7 lymph nodes. -Left simple mastectomy revealed lobular neoplasia and fibrocystic changes with adenosis and calcifications. 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 -Skin of the right mastectomy flap was benign. -mpT2, pN1a   06/25/2016 - 08/11/2016 Radiation Therapy   Site/dose:    1. 4 field Right breast was treated to 50.4 Gy in 25 fractions at 1.8 Gy per fraction. 2. The Right breast was boosted to 10 Gy in 5 fractions at 2 Gy per fraction.   04/15/2017 Genetic Testing   The patient had genetic testing due to a personal history of breast cancer and family history of breast, ovarian, and colon  cancer. The Common Hereditary Cancer Panel was ordered.  The Common Hereditary Cancer Panel offered by [facility] includes sequencing and/or deletion duplication testing of the following 47 genes: APC, ATM, AXIN2, BARD1, BMPR1A, BRCA1, BRCA2, BRIP1, CDH1, CDKN2A (p14ARF), CDKN2A (p16INK4a), CKD4, CHEK2, CTNNA1, DICER1, EPCAM (Deletion/duplication testing only), GREM1 (promoter region deletion/duplication testing only), KIT, MEN1, MLH1, MSH2, MSH3, MSH6, MUTYH, NBN, NF1, NHTL1, PALB2, PDGFRA, PMS2, POLD1, POLE, PTEN, RAD50, RAD51C, RAD51D, SDHB, SDHC, SDHD, SMAD4, SMARCA4. STK11, TP53, TSC1, TSC2, and VHL.  The following genes were evaluated for sequence changes only: SDHA and HOXB13 c.251G>A variant only.  Results: Negative, no pathogenic variants identified. The date of this test report is 04/15/2017   06/28/2017 Surgery   COMPLEX REVISION OF BACK SCAR and LEFT BREAST RECONSTRUCTION WITH SILICONE IMPLANT EXCHANGE AND ACELLULARDERMIS TO LEFT CHEST by Dr. Leta Baptist 06/28/17       REVIEW OF SYSTEMS:   Constitutional: Denies fevers, chills or abnormal weight loss Eyes: Denies blurriness of vision Ears, nose, mouth, throat, and face: Denies mucositis or sore throat Respiratory: Denies cough, dyspnea or wheezes Cardiovascular: Denies palpitation, chest discomfort or lower extremity swelling Gastrointestinal:  Denies nausea, heartburn or change in bowel habits Skin: Denies abnormal skin rashes Lymphatics: Denies new lymphadenopathy or easy bruising Neurological:Denies numbness, tingling or new weaknesses Behavioral/Psych: Mood is stable, no new changes  All other systems were reviewed with the patient and are negative.   VITALS:   Today's Vitals   05/07/23 1356  BP: 138/70  Pulse: 98  Resp: 20  Temp: 97.8 F (36.6 C)  TempSrc: Temporal  SpO2: 99%  Weight: 136 lb 4.8 oz (61.8 kg)  Height: 5\' 5"  (1.651 m)   Body mass index is 22.68 kg/m.   Wt Readings from Last 3 Encounters:  05/07/23 136  lb 4.8 oz (61.8 kg)  04/06/23 142 lb (64.4 kg)  02/18/23 142 lb (64.4 kg)    Body mass index is 22.68 kg/m.  Performance status (ECOG): 1 - Symptomatic but completely ambulatory  PHYSICAL EXAM:   GENERAL:alert, no distress and comfortable SKIN: skin color, texture, turgor are normal, no rashes or significant lesions EYES: normal, Conjunctiva are pink and non-injected, sclera clear OROPHARYNX:no exudate, no erythema and lips, buccal mucosa, and tongue normal  NECK: supple, thyroid normal size, non-tender, without nodularity LYMPH:  no palpable lymphadenopathy in the cervical, axillary or inguinal LUNGS: clear to auscultation and percussion with normal breathing effort HEART: regular rate & rhythm and no murmurs and no lower extremity edema ABDOMEN:abdomen soft, non-tender and normal bowel sounds Musculoskeletal:no cyanosis of digits and no clubbing  NEURO: alert & oriented x 3 with fluent speech, no focal motor/sensory deficits CHEST WALL: Bilateral mastectomy.  Surgical scars on right  well-healed with palpable scar tissue adjacent to well-healed incision.  No lumps or masses appreciated along the chest wall.  No axillary lymphadenopathy noted on the right.  Left mastectomy scar well-healed.  No palpable masses over lumps along chest wall.  No palpable lymphadenopathy on the left.  LABORATORY DATA:  I have reviewed the data as listed    Component Value Date/Time   NA 140 05/07/2023 1335   NA 141 02/19/2017 1304   K 3.3 (L) 05/07/2023 1335   K 4.0 02/19/2017 1304   CL 100 05/07/2023 1335   CO2 31 05/07/2023 1335   CO2 26 02/19/2017 1304   GLUCOSE 126 (H) 05/07/2023 1335   GLUCOSE 99 02/19/2017 1304   BUN 7 (L) 05/07/2023 1335   BUN 16.4 02/19/2017 1304   CREATININE 0.89 05/07/2023 1335   CREATININE 0.9 02/19/2017 1304   CALCIUM 9.0 05/07/2023 1335   CALCIUM 8.8 02/19/2017 1304   PROT 6.9 05/07/2023 1335   PROT 6.2 (L) 02/19/2017 1304   ALBUMIN 4.4 05/07/2023 1335   ALBUMIN  3.6 02/19/2017 1304   AST 20 05/07/2023 1335   AST 22 02/19/2017 1304   ALT 16 05/07/2023 1335   ALT 23 02/19/2017 1304   ALKPHOS 90 05/07/2023 1335   ALKPHOS 88 02/19/2017 1304   BILITOT 0.4 05/07/2023 1335   BILITOT 0.39 02/19/2017 1304   GFRNONAA >60 05/07/2023 1335   GFRNONAA 72 11/06/2015 0831   GFRAA >60 10/12/2019 1445   GFRAA 83 11/06/2015 0831      Lab Results  Component Value Date   WBC 4.6 05/07/2023   NEUTROABS 2.1 05/07/2023   HGB 14.6 05/07/2023   HCT 45.9 05/07/2023   MCV 83.0 05/07/2023   PLT 281 05/07/2023     RADIOGRAPHIC STUDIES: CT CHEST ABDOMEN PELVIS W CONTRAST Result Date: 04/17/2023 CLINICAL DATA:  Unintentional weight loss, * Tracking Code: BO *. EXAM: CT CHEST, ABDOMEN, AND PELVIS WITH CONTRAST TECHNIQUE: Multidetector CT imaging of the chest, abdomen and pelvis was performed following the standard protocol during bolus administration of intravenous contrast. RADIATION DOSE REDUCTION: This exam was performed according to the departmental dose-optimization program which includes automated exposure control, adjustment of the mA and/or kV according to patient size and/or use of iterative reconstruction technique. CONTRAST:  OMNIPAQUE IOHEXOL 300 MG/ML  SOLN COMPARISON:  CT September 10, 2021 and ultrasound September 22, 2019 FINDINGS: CT CHEST FINDINGS Cardiovascular: Normal caliber thoracic aorta. No central pulmonary embolus on this nondedicated study. Normal size heart. No significant pericardial effusion/thickening. Mediastinum/Nodes: No suspicious thyroid nodule. No pathologically enlarged mediastinal, hilar or axillary lymph nodes. Bilateral axillary surgical clips. Esophagus is grossly unremarkable. Lungs/Pleura: Cluster of tiny nodules in the right upper lobe for instance measuring 2 mm on image 67/7 and 1 mm on image 64/7. Right apical pleuroparenchymal scarring. Minimal left apical pleuroparenchymal scarring. No pleural effusion. No pneumothorax.  Musculoskeletal: Nonspecific nodular focus of sclerosis in the right humeral head on image 16/7. Slight lucency in the body of the right scapula may reflect postradiation change. Nodular foci of sclerosis in the T4 vertebral body, T8 inferior endplate and throughout the and T9 vertebral body. Probable prior bilateral mastectomies. CT ABDOMEN PELVIS FINDINGS Hepatobiliary: No suspicious hepatic lesion. Gallbladder surgically absent. Prominence of the biliary tree is within normal limits for reservoir effect post cholecystectomy. Pancreas: No pancreatic ductal dilation or evidence of acute inflammation. Spleen: No splenomegaly. Adrenals/Urinary Tract: Bilateral adrenal glands appear normal. Bilateral extrarenal pelvis. Kidneys demonstrate symmetric enhancement. Urinary bladder is unremarkable for  degree of distension. Stomach/Bowel: No radiopaque enteric contrast material was administered. Questionable wall thickening of the nondistended stomach. No pathologic dilation of small or large bowel. Gas fluid levels in the ascending colon. Vascular/Lymphatic: Normal caliber abdominal aorta. Smooth IVC contours. Portal, splenic and superior mesenteric veins are patent. No pathologically enlarged abdominal or pelvic lymph nodes. Reproductive: Status post hysterectomy. No adnexal masses. Other: No significant abdominopelvic free fluid. Musculoskeletal: Sclerosis in the S1 vertebral body. IMPRESSION: 1. Nodular foci of sclerosis in the right humeral head, T4 vertebral body, T8 inferior endplate, T9 vertebral body and S1 vertebral body, in the setting prior breast cancer concerning for osseous metastasis. Consider further evaluation by nuclear medicine bone scan. 2. Cluster of tiny nodules in the right upper lobe measuring up to 2 mm, nonspecific favored to reflect an infectious or inflammatory process but warranting attention on short-term interval follow-up dedicated chest CT. 3. Intra and extrahepatic biliary ductal dilation  in the setting of prior cholecystectomy is favored reservoir effect post cholecystectomy, suggest correlation with serum LFTs. 4. Questionable wall thickening of the nondistended stomach, which may reflect gastritis. 5. Gas fluid levels in the ascending colon, which can be seen in the setting of diarrheal illness. Electronically Signed   By: Maudry Mayhew M.D.   On: 04/17/2023 17:14

## 2023-05-07 ENCOUNTER — Inpatient Hospital Stay: Payer: BC Managed Care – PPO | Admitting: Nurse Practitioner

## 2023-05-07 ENCOUNTER — Inpatient Hospital Stay: Payer: BC Managed Care – PPO | Attending: Nurse Practitioner

## 2023-05-07 VITALS — BP 138/70 | HR 98 | Temp 97.8°F | Resp 20 | Ht 65.0 in | Wt 136.3 lb

## 2023-05-07 DIAGNOSIS — C50011 Malignant neoplasm of nipple and areola, right female breast: Secondary | ICD-10-CM

## 2023-05-07 DIAGNOSIS — M899 Disorder of bone, unspecified: Secondary | ICD-10-CM | POA: Diagnosis not present

## 2023-05-07 DIAGNOSIS — Z170 Estrogen receptor positive status [ER+]: Secondary | ICD-10-CM | POA: Insufficient documentation

## 2023-05-07 DIAGNOSIS — C50811 Malignant neoplasm of overlapping sites of right female breast: Secondary | ICD-10-CM | POA: Diagnosis not present

## 2023-05-07 DIAGNOSIS — Z79811 Long term (current) use of aromatase inhibitors: Secondary | ICD-10-CM | POA: Insufficient documentation

## 2023-05-07 DIAGNOSIS — Z1721 Progesterone receptor positive status: Secondary | ICD-10-CM | POA: Insufficient documentation

## 2023-05-07 DIAGNOSIS — Z1732 Human epidermal growth factor receptor 2 negative status: Secondary | ICD-10-CM | POA: Diagnosis not present

## 2023-05-07 DIAGNOSIS — C773 Secondary and unspecified malignant neoplasm of axilla and upper limb lymph nodes: Secondary | ICD-10-CM | POA: Insufficient documentation

## 2023-05-07 DIAGNOSIS — Z79899 Other long term (current) drug therapy: Secondary | ICD-10-CM | POA: Insufficient documentation

## 2023-05-07 DIAGNOSIS — Z923 Personal history of irradiation: Secondary | ICD-10-CM | POA: Diagnosis not present

## 2023-05-07 DIAGNOSIS — Z9013 Acquired absence of bilateral breasts and nipples: Secondary | ICD-10-CM | POA: Insufficient documentation

## 2023-05-07 LAB — CMP (CANCER CENTER ONLY)
ALT: 16 U/L (ref 0–44)
AST: 20 U/L (ref 15–41)
Albumin: 4.4 g/dL (ref 3.5–5.0)
Alkaline Phosphatase: 90 U/L (ref 38–126)
Anion gap: 9 (ref 5–15)
BUN: 7 mg/dL — ABNORMAL LOW (ref 8–23)
CO2: 31 mmol/L (ref 22–32)
Calcium: 9 mg/dL (ref 8.9–10.3)
Chloride: 100 mmol/L (ref 98–111)
Creatinine: 0.89 mg/dL (ref 0.44–1.00)
GFR, Estimated: >60 mL/min (ref >60)
Glucose, Bld: 126 mg/dL — ABNORMAL HIGH (ref 70–99)
Potassium: 3.3 mmol/L — ABNORMAL LOW (ref 3.5–5.1)
Sodium: 140 mmol/L (ref 135–145)
Total Bilirubin: 0.4 mg/dL (ref 0.0–1.2)
Total Protein: 6.9 g/dL (ref 6.5–8.1)

## 2023-05-07 LAB — CBC WITH DIFFERENTIAL (CANCER CENTER ONLY)
Abs Immature Granulocytes: 0.01 10*3/uL (ref 0.00–0.07)
Basophils Absolute: 0.1 10*3/uL (ref 0.0–0.1)
Basophils Relative: 2 %
Eosinophils Absolute: 0.1 10*3/uL (ref 0.0–0.5)
Eosinophils Relative: 2 %
HCT: 45.9 % (ref 36.0–46.0)
Hemoglobin: 14.6 g/dL (ref 12.0–15.0)
Immature Granulocytes: 0 %
Lymphocytes Relative: 40 %
Lymphs Abs: 1.8 10*3/uL (ref 0.7–4.0)
MCH: 26.4 pg (ref 26.0–34.0)
MCHC: 31.8 g/dL (ref 30.0–36.0)
MCV: 83 fL (ref 80.0–100.0)
Monocytes Absolute: 0.5 10*3/uL (ref 0.1–1.0)
Monocytes Relative: 10 %
Neutro Abs: 2.1 10*3/uL (ref 1.7–7.7)
Neutrophils Relative %: 46 %
Platelet Count: 281 10*3/uL (ref 150–400)
RBC: 5.53 MIL/uL — ABNORMAL HIGH (ref 3.87–5.11)
RDW: 16.9 % — ABNORMAL HIGH (ref 11.5–15.5)
WBC Count: 4.6 10*3/uL (ref 4.0–10.5)
nRBC: 0 % (ref 0.0–0.2)

## 2023-05-07 MED ORDER — ONDANSETRON HCL 4 MG PO TABS: 4.0000 mg | ORAL_TABLET | ORAL | 2 refills | Status: DC | PRN

## 2023-05-08 LAB — CANCER ANTIGEN 27.29: CA 27.29: 1125.8 U/mL — ABNORMAL HIGH (ref 0.0–38.6)

## 2023-05-09 ENCOUNTER — Encounter: Payer: Self-pay | Admitting: Nurse Practitioner

## 2023-05-09 ENCOUNTER — Encounter: Payer: Self-pay | Admitting: Hematology

## 2023-05-09 ENCOUNTER — Telehealth: Payer: Self-pay | Admitting: Nurse Practitioner

## 2023-05-09 NOTE — Assessment & Plan Note (Signed)
 History of right breast cancer.  Bilateral total mastectomy.  Due to recent symptoms of nausea, decreased appetite, GERD, and unexplained weight loss, patient saw GI provider.  CT scan CAP showing new bony lesions of thoracic and sacral spine.  Bony lesion of right humerus head also.  Nuclear medicine whole-body bone scan ordered today.  Follow-up with Dr. Mosetta Putt after results of bone scan available.

## 2023-05-09 NOTE — Telephone Encounter (Signed)
 Called and spoke to  patient over the phone on 05/09/2023 to advise  her of Ca 27.29 results, which are elevated. Patient scheduled for NM bone scan on Friday, 05/14/2023. Will have her scheduled with Dr. Mosetta Putt first few days of the following week to discuss results and possible treatment plan.

## 2023-05-10 ENCOUNTER — Telehealth: Payer: Self-pay | Admitting: Hematology

## 2023-05-10 NOTE — Telephone Encounter (Signed)
 Per scheduling orders on 05/07/2023 patient is aware of scheduled appointment times/dates; patient has callback number for scheduling line if needing to reschedule or cancel appointments

## 2023-05-14 ENCOUNTER — Encounter (HOSPITAL_COMMUNITY)
Admission: RE | Admit: 2023-05-14 | Discharge: 2023-05-14 | Disposition: A | Discharge status: Home / Self Care | Source: Ambulatory Visit | Attending: Nurse Practitioner | Admitting: Nurse Practitioner

## 2023-05-14 DIAGNOSIS — Z170 Estrogen receptor positive status [ER+]: Secondary | ICD-10-CM | POA: Insufficient documentation

## 2023-05-14 DIAGNOSIS — Z9013 Acquired absence of bilateral breasts and nipples: Secondary | ICD-10-CM | POA: Diagnosis not present

## 2023-05-14 DIAGNOSIS — M899 Disorder of bone, unspecified: Secondary | ICD-10-CM | POA: Diagnosis not present

## 2023-05-14 DIAGNOSIS — C50811 Malignant neoplasm of overlapping sites of right female breast: Secondary | ICD-10-CM | POA: Diagnosis not present

## 2023-05-14 DIAGNOSIS — C50919 Malignant neoplasm of unspecified site of unspecified female breast: Secondary | ICD-10-CM | POA: Diagnosis not present

## 2023-05-14 DIAGNOSIS — C799 Secondary malignant neoplasm of unspecified site: Secondary | ICD-10-CM | POA: Diagnosis not present

## 2023-05-14 MED ORDER — TECHNETIUM TC 99M MEDRONATE IV KIT
20.0000 | PACK | Freq: Once | INTRAVENOUS | Status: AC | PRN
  Administered 2023-05-14: 19.7 via INTRAVENOUS

## 2023-05-19 ENCOUNTER — Inpatient Hospital Stay: Admitting: Hematology

## 2023-05-19 ENCOUNTER — Encounter: Payer: Self-pay | Admitting: Hematology

## 2023-05-19 DIAGNOSIS — C773 Secondary and unspecified malignant neoplasm of axilla and upper limb lymph nodes: Secondary | ICD-10-CM | POA: Diagnosis not present

## 2023-05-19 DIAGNOSIS — Z170 Estrogen receptor positive status [ER+]: Secondary | ICD-10-CM

## 2023-05-19 DIAGNOSIS — Z79811 Long term (current) use of aromatase inhibitors: Secondary | ICD-10-CM | POA: Diagnosis not present

## 2023-05-19 DIAGNOSIS — C50811 Malignant neoplasm of overlapping sites of right female breast: Secondary | ICD-10-CM | POA: Diagnosis not present

## 2023-05-19 DIAGNOSIS — Z1732 Human epidermal growth factor receptor 2 negative status: Secondary | ICD-10-CM | POA: Diagnosis not present

## 2023-05-19 DIAGNOSIS — Z1721 Progesterone receptor positive status: Secondary | ICD-10-CM | POA: Diagnosis not present

## 2023-05-19 DIAGNOSIS — Z79899 Other long term (current) drug therapy: Secondary | ICD-10-CM | POA: Diagnosis not present

## 2023-05-19 DIAGNOSIS — Z923 Personal history of irradiation: Secondary | ICD-10-CM | POA: Diagnosis not present

## 2023-05-19 DIAGNOSIS — Z9013 Acquired absence of bilateral breasts and nipples: Secondary | ICD-10-CM | POA: Diagnosis not present

## 2023-05-19 NOTE — Assessment & Plan Note (Signed)
 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 S/p bilateral mastectomy with reconstruction and adjuvant radiation. (Implants ultimately removed 01/24/19 by Dr. Leta Baptist.) -She started antiestrogen therapy Exemestane on 04/01/16, planned for 7 years. She experienced joint pain and hot flashes on exemestane, anastrozole, and tamoxifen. She opted to continue anastrozole. Given her continued difficulty with joint pain, as well as her low risk mammaprint, she stopped after she completed 5 years therapy in 03/2021.

## 2023-05-19 NOTE — Progress Notes (Signed)
 [facility]   [phone] [phone]   Clinic Follow up Note   Patient Care Team: Fatima Sanger, FNP as PCP - General (Internal Medicine) Aplington, Illene Labrador, MD (Inactive) (Orthopedic Surgery) Jethro Bolus, MD (Ophthalmology) Janalyn Harder, MD (Inactive) (Dermatology) Malachy Mood, MD as Consulting Physician (Hematology) Emelia Loron, MD as Consulting Physician (General Surgery) Dorothy Puffer, MD as Consulting Physician (Radiation Oncology) Loa Socks, NP as Nurse Practitioner (Hematology and Oncology) 05/19/2023  I connected with [name] on 05/19/23 at  2:20 PM EDT by telephone and verified that I am speaking with the correct person using two identifiers.   I discussed the limitations, risks, security and privacy concerns of performing an evaluation and management service by telephone and the availability of in person appointments. I also discussed with the patient that there may be a patient responsible charge related to this service. The patient expressed understanding and agreed to proceed.   Patient's location:  Home  Provider's location:  Office    CHIEF COMPLAINT: f/u bone scan result    CURRENT THERAPY: none   Oncology history Malignant neoplasm of overlapping sites of right breast in female, estrogen receptor positive (HCC)  mpT2 pN1a, stage IA, G1,  ER positive, PR positive, HER-2 negative, mammaprint low risk luminal type A -Diagnosed in 02/2016. S/p bilateral mastectomy with reconstruction and adjuvant radiation. (Implants ultimately removed 01/24/19 by Dr. Leta Baptist.) -She started antiestrogen therapy Exemestane on 04/01/16, planned for 7 years. She experienced joint pain and hot flashes on exemestane, anastrozole, and tamoxifen. She opted to continue anastrozole. Given her continued difficulty with joint pain, as well as her low risk mammaprint, she stopped after she completed 5 years therapy in  03/2021.  Assessment and Plan    Suspected bone metastasis She has breast cancer with recent imaging, including a CT scan and bone scan, showing concerning bone lesions in the thoracic spine, sacrum, and left hip. Elevated tumor markers further suggest metastatic disease. However, the bone scan lacks specificity, and benign conditions could mimic these findings. A bone biopsy is necessary for confirmation. If metastasis is confirmed, treatment may involve anti-axial therapy and oral biologic agents, which are generally more tolerable than chemotherapy. Radiation therapy is not planned unless significant pain is present. - Order MRI of the spine to evaluate bone lesions - Schedule bone biopsy to confirm diagnosis - Discuss potential treatment options if diagnosis is confirmed, including anti-axial therapy and oral biologic agents - Ensure sedation is provided during the bone biopsy to minimize discomfort  Dysphagia She reports dysphagia, with food feeling stuck in the throat and causing nausea, associated with a hiatal hernia and possible esophageal issues. The CT scan showed questionable gastric wall thickening, possibly related to gastritis.  Weight loss She has experienced significant weight loss of approximately 40 pounds over 2-3 months, associated with dysphagia and nausea, leading to reduced food intake. She is aware of the need to maintain nutrition.  Arthritis She has arthritis in the back and hands, contributing to chronic pain, with new soreness in the right shoulder.  Thyroid problems She mentioned thyroid problems that have not been recently evaluated. No specific symptoms or concerns were discussed during this visit.  Follow-up Plan to follow up after the biopsy to discuss the diagnosis and potential treatment options. She expressed anxiety about possible cancer recurrence and treatment implications. The biopsy is not expected to be life-threatening, but confirmed metastatic  disease could impact her lifestyle and longevity. - Schedule follow-up appointment  3-4 days after biopsy to discuss results and treatment options         SUMMARY OF ONCOLOGIC HISTORY: Oncology History Overview Note  Cancer Staging Malignant neoplasm of overlapping sites of right breast in female, estrogen receptor positive (HCC) Staging form: Breast, AJCC 8th Edition - Clinical stage from 03/23/2016: Stage IIA (cT3, cN1, cM0, G2, ER: Positive, PR: Positive, HER2: Negative) - Signed by Malachy Mood, MD on 03/31/2016 - Pathologic stage from 05/19/2016: Stage IA (pT2(m), pN1a, cM0, G1, ER: Positive, PR: Positive, HER2: Negative) - Signed by Malachy Mood, MD on 06/18/2016     Malignant neoplasm of overlapping sites of right breast in female, estrogen receptor positive (HCC)  03/17/2016 Mammogram   B/l diagnostic mammogram and righ US showed a 3.6cm irregular mass in the right breast 11:00 position, posterior depth, there is a enlarged lymph node in the right axilla is highly suspicious for malignancy. additional 7 mm oval mass in the right breast lower outer quadrant is suspicious for malignancy.   03/23/2016 Initial Biopsy   Right breast 9:30 position biopsy showed invasive ductal carcinoma, grade 1. 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   03/23/2016 Receptors her2   Both breast and node biopsy tumor ER 100% positive, PR 70-95% positive, HER-2 negative, Ki-67 40%   03/23/2016 Initial Diagnosis   Malignant neoplasm of upper-outer quadrant of right breast in female, estrogen receptor positive (HCC)   03/25/2016 Initial Biopsy   Right breast 11:00 position core needle biopsy showed invasive duct carcinoma, grade 2.    03/25/2016 Receptors her2   ER 95% positive, PR 90% positive, HER-2 negative, Ki-67 15%   03/25/2016 Miscellaneous   Mammaprint showed low risk type A with index +0.105   03/30/2016 Imaging   Bilateral breast MRI with and without contrast showed a large  lobulated enhancing mass within the upper-outer and lower outer right breast with surrounding nodularity, measuring 6.1 x 4.4 x 5.6 cm. Multiple critically sick and right axillary lymph nodes are demonstrated measuring up to 1.5 cm.    04/01/2016 -  Anti-estrogen oral therapy   Exemestane 25 mg daily, plan for 7 years. Switched to Anastrozole 1mg  in 06/2017 due to joint pain and hot flashes. Due to persistent side effects I changed her to Tamoxifen in 09/2018. She stopped Tamoxifen and switched back to anastrozole in 01/2019 because it was more tolerable.    05/19/2016 Surgery   Bilateral mastectomy and right axillary regional lymph node resection.   05/19/2016 Pathology Results   -Right axillary regional lymph node resection revealed metastatic carcinoma in 2/7 lymph nodes. -Left simple mastectomy revealed lobular neoplasia and fibrocystic changes with adenosis and calcifications. 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 -Skin of the right mastectomy flap was benign. -mpT2, pN1a   06/25/2016 - 08/11/2016 Radiation Therapy   Site/dose:    1. 4 field Right breast was treated to 50.4 Gy in 25 fractions at 1.8 Gy per fraction. 2. The Right breast was boosted to 10 Gy in 5 fractions at 2 Gy per fraction.   04/15/2017 Genetic Testing   The patient had genetic testing due to a personal history of breast cancer and family history of breast, ovarian, and colon cancer. The Common Hereditary Cancer Panel was ordered.  The Common Hereditary Cancer Panel offered by [facility] includes sequencing and/or deletion duplication testing of the  following 47 genes: APC, ATM, AXIN2, BARD1, BMPR1A, BRCA1, BRCA2, BRIP1, CDH1, CDKN2A (p14ARF), CDKN2A (p16INK4a), CKD4, CHEK2, CTNNA1, DICER1, EPCAM (Deletion/duplication  testing only), GREM1 (promoter region deletion/duplication testing only), KIT, MEN1, MLH1, MSH2, MSH3, MSH6, MUTYH, NBN, NF1, NHTL1, PALB2, PDGFRA, PMS2, POLD1, POLE, PTEN, RAD50, RAD51C, RAD51D, SDHB, SDHC, SDHD, SMAD4, SMARCA4. STK11, TP53, TSC1, TSC2, and VHL.  The following genes were evaluated for sequence changes only: SDHA and HOXB13 c.251G>A variant only.  Results: Negative, no pathogenic variants identified. The date of this test report is 04/15/2017   06/28/2017 Surgery   COMPLEX REVISION OF BACK SCAR and LEFT BREAST RECONSTRUCTION WITH SILICONE IMPLANT EXCHANGE AND ACELLULARDERMIS TO LEFT CHEST by Dr. Leta Baptist 06/28/17     Discussed the use of AI scribe software for clinical note transcription with the patient, who gave verbal consent to proceed.  History of Present Illness   The patient, a 63 year old with a history of breast cancer, presents for a phone consultation to discuss recent imaging results. She reports a recent weight loss of approximately 40 pounds over the past two to three months, which she attributes to difficulty swallowing and a decreased appetite. She describes a sensation of food getting stuck in her throat, which occasionally induces a feeling of nausea, though she has not vomited. She has been managing these symptoms with prescribed nausea medication. She also reports chronic pain in the area where her lymph nodes were removed, as well as arthritis in her back and hands. Recently, she has noticed soreness in her right shoulder and has been experiencing severe headaches.  The patient also mentions a history of thyroid problems, which have not been checked recently, and a recent diagnosis of a hiatal hernia. She has been experiencing constipation, which she attributes to her decreased food intake. She expresses concern about the results of her recent bone scan, which was ordered after a CT scan revealed some concerning findings.         REVIEW OF SYSTEMS:    Constitutional: Denies fevers, chills or abnormal weight loss Eyes: Denies blurriness of vision Ears, nose, mouth, throat, and face: Denies mucositis or sore throat Respiratory: Denies cough, dyspnea or wheezes Cardiovascular: Denies palpitation, chest discomfort or lower extremity swelling Gastrointestinal:  Denies nausea, heartburn or change in bowel habits Skin: Denies abnormal skin rashes Lymphatics: Denies new lymphadenopathy or easy bruising Neurological:Denies numbness, tingling or new weaknesses Behavioral/Psych: Mood is stable, no new changes  All other systems were reviewed with the patient and are negative.  MEDICAL HISTORY:  Past Medical History:  Diagnosis Date   Anxiety    Anxiety disorder    Asthma    h/o asthma as a child   Breast cancer (HCC)    Cancer (HCC) 04/2016   right breast cancer   Cholelithiasis    Chronic kidney disease    obstruction of R kidney, ( not a stone) - currently resolved    Depression    Diverticulosis    DJD (degenerative joint disease)    hands & back    Dyspnea    resolved since she stopped smoking    Epigastric abdominal pain    Esophageal stricture    Family history of adverse reaction to anesthesia    daughter has N&V, takes long time to wake up    Family history of colon cancer    Family history of ovarian cancer    Family hx of colon cancer    Female pelvic peritoneal adhesions 10/26/2012   Ganglion cyst  GERD (gastroesophageal reflux disease)    Headache    low grade currently , family history of migraines    Hemorrhoid    History of radiation therapy 05/03 - 08/11/2016   1. 4 field Right breast was treated to 50.4 Gy in 25 fractions at 1.8 Gy per fraction. 2. The Right breast was boosted to 10 Gy in 5 fractions at 2 Gy per fraction.   Hypertension    Hypothyroidism    Plantar fasciitis, bilateral    PONV (postoperative nausea and vomiting)    gets anxious with the mask on her face, also remarks that the scop. patch  has helped in the past       SURGICAL HISTORY: Past Surgical History:  Procedure Laterality Date   ABDOMINAL HYSTERECTOMY     BREAST IMPLANT REMOVAL Bilateral 01/24/2019   Procedure: REMOVAL BREAST IMPLANTS;  Surgeon: Glenna Fellows, MD;  Location: [facility];  Service: Plastics;  Laterality: Bilateral;   BREAST RECONSTRUCTION WITH PLACEMENT OF TISSUE EXPANDER AND FLEX HD (ACELLULAR HYDRATED DERMIS) Bilateral 05/19/2016   Procedure: BREAST RECONSTRUCTION WITH PLACEMENT OF TISSUE EXPANDER AND ALLODERM PLACEMENT;  Surgeon: Glenna Fellows, MD;  Location: [facility];  Service: Plastics;  Laterality: Bilateral;   BREAST RECONSTRUCTION WITH PLACEMENT OF TISSUE EXPANDER AND FLEX HD (ACELLULAR HYDRATED DERMIS) Left 06/28/2017   Procedure: LEFT BREAST RECONSTRUCTION WITH SILICONE IMPLANT EXCHANGE AND ACELLULARDERMIS TO LEFT CHEST;  Surgeon: Glenna Fellows, MD;  Location: [facility];  Service: Plastics;  Laterality: Left;   CAPSULECTOMY Bilateral 01/24/2019   Procedure: CAPSULECTOMY;  Surgeon: Glenna Fellows, MD;  Location: [facility];  Service: Plastics;  Laterality: Bilateral;   CESAREAN SECTION  1988   CHOLECYSTECTOMY     HEMORRHOID SURGERY     LAPAROSCOPIC BILATERAL SALPINGECTOMY N/A 10/26/2012   Procedure: operative laparoscopy with lysis of adhesions;  Surgeon: Sherron Monday, MD;  Location: [facility];  Service: Gynecology;  Laterality: N/A;   LESION EXCISION WITH COMPLEX REPAIR Right 06/09/2019   Procedure: COMPLEX REPAIR RIGHT CHEST 15CM;  Surgeon: Glenna Fellows, MD;  Location: [facility];  Service: Plastics;  Laterality: Right;   MASTECTOMY Bilateral    MASTECTOMY WITH RADIOACTIVE SEED GUIDED EXCISION AND AXILLARY SENTINEL LYMPH NODE BIOPSY Bilateral 05/19/2016   Procedure: RIGHT SKIN SPARING MASTECTOMY WITH RIGHT RADIOACTIVE SEED TARGETED DISSECTION AND RIGHT SENTINEL LYMPH NODE BIOPSY, LEFT PROPHYLACTIC SKIN SPARING  MASTECTOMY;  Surgeon: Emelia Loron, MD;  Location: [facility];  Service: General;  Laterality: Bilateral;   REMOVAL OF BILATERAL TISSUE EXPANDERS WITH PLACEMENT OF BILATERAL BREAST IMPLANTS Bilateral 01/29/2017   Procedure: REMOVAL OF BILATERAL TISSUE EXPANDERS WITH PLACEMENT OF BILATERAL SILICONE BREAST IMPLANTS, ALLODERM TO LEFT BREAST RECONSTRUCTION;  RIGHT LATISSUMUS FLAP;  Surgeon: Glenna Fellows, MD;  Location: [facility];  Service: Plastics;  Laterality: Bilateral;  Requesting RNFA   SCAR REVISION Right 06/28/2017   Procedure: COMPLEX REVISION OF BACK SCAR;  Surgeon: Glenna Fellows, MD;  Location:  [facility];  Service: Plastics;  Laterality: Right;   TONSILLECTOMY     TUBAL LIGATION     urologic surgery for ureteropelvic junction obstruction      I have reviewed the social history and family history with the patient and they are unchanged from previous note.  ALLERGIES:  is allergic to adhesive [tape], promethazine hcl, and sulfa antibiotics.  MEDICATIONS:  Current Outpatient Medications  Medication Sig Dispense Refill   acetaminophen (TYLENOL) 500 MG tablet Take 1,000 mg by mouth every  6 (six) hours as needed.     ALPRAZolam (XANAX) 1 MG tablet Take 1 tablet by mouth 3 (three) times daily.     celecoxib (CELEBREX) 200 MG capsule Take 1 capsule (200 mg total) by mouth daily. (Patient taking differently: Take 200 mg by mouth as needed.) 30 capsule 2   cloNIDine (CATAPRES) 0.1 MG tablet TAKE 1 TABLET BY MOUTH EVERY DAY 90 tablet 1   gabapentin (NEURONTIN) 100 MG capsule Take 1 capsule every day by oral route at dinner.     levothyroxine (SYNTHROID, LEVOTHROID) 50 MCG tablet Take 50 mcg by mouth daily before breakfast.     omeprazole (PRILOSEC) 40 MG capsule Take 1 capsule (40 mg total) by mouth 2 (two) times daily. 180 capsule 2   ondansetron (ZOFRAN) 4 MG tablet Take 1 tablet (4 mg total) by mouth every 4 (four) hours as needed for nausea or vomiting. 30  tablet 2   venlafaxine XR (EFFEXOR-XR) 150 MG 24 hr capsule TAKE 1 CAPSULE BY MOUTH EVERY DAY WITH BREAKFAST 90 capsule 1   venlafaxine XR (EFFEXOR-XR) 75 MG 24 hr capsule TAKE 1 CAPSULE BY MOUTH DAILY WITH BREAKFAST. 90 capsule 2   zolpidem (AMBIEN) 5 MG tablet TAKE 1 TABLET BY MOUTH EVERYDAY AT BEDTIME     No current facility-administered medications for this visit.    PHYSICAL EXAMINATION: Not performed   LABORATORY DATA:  I have reviewed the data as listed    Latest Ref Rng & Units 05/07/2023    1:35 PM 05/18/2022    1:45 PM 09/18/2021   10:57 AM  CBC  WBC 4.0 - 10.5 K/uL 4.6  5.7  4.5   Hemoglobin 12.0 - 15.0 g/dL 16.1  09.6  04.5   Hematocrit 36.0 - 46.0 % 45.9  41.8  40.9   Platelets 150 - 400 K/uL 281  267  263         Latest Ref Rng & Units 05/07/2023    1:35 PM 05/18/2022    1:45 PM 09/18/2021   10:57 AM  CMP  Glucose 70 - 99 mg/dL 409  811  914   BUN 8 - 23 mg/dL 7  9  14    Creatinine 0.44 - 1.00 mg/dL 7.82  9.56  2.13   Sodium 135 - 145 mmol/L 140  140  138   Potassium 3.5 - 5.1 mmol/L 3.3  4.1  4.0   Chloride 98 - 111 mmol/L 100  106  104   CO2 22 - 32 mmol/L 31  25  26    Calcium 8.9 - 10.3 mg/dL 9.0  9.6  8.9   Total Protein 6.5 - 8.1 g/dL 6.9  6.9  6.7   Total Bilirubin 0.0 - 1.2 mg/dL 0.4  0.4  0.4   Alkaline Phos 38 - 126 U/L 90  73  81   AST 15 - 41 U/L 20  29  25    ALT 0 - 44 U/L 16  28  26        RADIOGRAPHIC STUDIES: I have personally reviewed the radiological images as listed and agreed with the findings in the report. No results found.     I discussed the assessment and treatment plan with the patient. The patient was provided an opportunity to ask questions and all were answered. The patient agreed with the plan and demonstrated an understanding of the instructions.   The patient was advised to call back or seek an in-person evaluation if the symptoms worsen or if the  condition fails to improve as anticipated.  I provided 25 minutes of non  face-to-face telephone visit time during this encounter, and > 50% was spent counseling as documented under my assessment & plan.     Malachy Mood, MD 05/19/23

## 2023-05-19 NOTE — Progress Notes (Signed)
 Roanna Banning, MD  Claudean Kinds Cc: Malachy Mood, MD PROCEDURE / BIOPSY REVIEW Date: 05/19/23  Requested Biopsy site: No specific site requested Reason for request: Hx R breast CA Imaging review: I reviewed all pertinent diagnostic studies, including; NM Bone scan and CT CAP  Decision: Declined / Defer  Additional comments: Sacral osseous metastases are more challenging to target. Consider PET CT and resubmit for biopsy review of PET positive surrogate site.   Please contact me with questions, concerns, or if issue pertaining to this request arise.  Roanna Banning, MD [facility]       Previous Messages    ----- Message ----- From: Claudean Kinds Sent: 05/19/2023   1:17 PM EDT To: Claudean Kinds; Ir Procedure Requests Subject: CT Biopsy                                      Procedure : CT Biopsy  Reason: confirm or rule out malignancy Dx: Malignant neoplasm of overlapping sites of right breast in female, estrogen receptor positive (HCC) [C50.811, Z17.0 (ICD-10-CM)]    History : NM Bone scan whole body , CT Chest abd pelvis w/  Provider : Malachy Mood, MD  Provider contact : [phone]

## 2023-05-20 ENCOUNTER — Other Ambulatory Visit: Payer: Self-pay

## 2023-05-20 DIAGNOSIS — M899 Disorder of bone, unspecified: Secondary | ICD-10-CM

## 2023-05-25 ENCOUNTER — Ambulatory Visit (HOSPITAL_COMMUNITY)

## 2023-05-25 ENCOUNTER — Encounter (HOSPITAL_COMMUNITY): Payer: Self-pay

## 2023-05-25 ENCOUNTER — Other Ambulatory Visit: Payer: Self-pay

## 2023-05-25 DIAGNOSIS — C50811 Malignant neoplasm of overlapping sites of right female breast: Secondary | ICD-10-CM

## 2023-05-26 ENCOUNTER — Ambulatory Visit: Admitting: Hematology

## 2023-06-01 ENCOUNTER — Ambulatory Visit (HOSPITAL_COMMUNITY)
Admission: RE | Admit: 2023-06-01 | Discharge: 2023-06-01 | Disposition: A | Discharge status: Home / Self Care | Source: Ambulatory Visit | Attending: Hematology | Admitting: Hematology

## 2023-06-01 DIAGNOSIS — R935 Abnormal findings on diagnostic imaging of other abdominal regions, including retroperitoneum: Secondary | ICD-10-CM | POA: Diagnosis not present

## 2023-06-01 DIAGNOSIS — C50811 Malignant neoplasm of overlapping sites of right female breast: Secondary | ICD-10-CM | POA: Insufficient documentation

## 2023-06-01 DIAGNOSIS — Z170 Estrogen receptor positive status [ER+]: Secondary | ICD-10-CM | POA: Insufficient documentation

## 2023-06-01 LAB — GLUCOSE, CAPILLARY: Glucose-Capillary: 112 mg/dL — ABNORMAL HIGH (ref 70–99)

## 2023-06-01 MED ORDER — FLUDEOXYGLUCOSE F - 18 (FDG) INJECTION
6.5000 | Freq: Once | INTRAVENOUS | Status: AC | PRN
  Administered 2023-06-01: 6.94 via INTRAVENOUS

## 2023-06-04 ENCOUNTER — Other Ambulatory Visit: Payer: Self-pay

## 2023-06-04 ENCOUNTER — Other Ambulatory Visit: Payer: Self-pay | Admitting: Nurse Practitioner

## 2023-06-04 ENCOUNTER — Telehealth: Payer: Self-pay | Admitting: Nurse Practitioner

## 2023-06-04 DIAGNOSIS — Z170 Estrogen receptor positive status [ER+]: Secondary | ICD-10-CM

## 2023-06-04 DIAGNOSIS — D487 Neoplasm of uncertain behavior of other specified sites: Secondary | ICD-10-CM

## 2023-06-04 DIAGNOSIS — M899 Disorder of bone, unspecified: Secondary | ICD-10-CM

## 2023-06-04 NOTE — Progress Notes (Signed)
 Per Dr. Fredia Sorrow :  OK for US guided biopsy of right supraclavicular lymph node; hot on PET. Cores for breast prognostic studies.

## 2023-06-04 NOTE — Progress Notes (Signed)
 Sterling Big, MD  Claudean Kinds Approved for CT guided deep bone biopsy.  There are multiple possible locations, I would suggest approaching the left iliac bone similar to a bone marrow biopsy technique (PET 148 se 14 and 149 se 604).  Also a good lesion to target in the left ischium posteriorly (PET 179 se 4).  HKM       Previous Messages    ----- Message ----- From: Claudean Kinds Sent: 06/04/2023   1:21 PM EDT To: Claudean Kinds; Ir Procedure Requests Subject: CT Biopsy                                      Procedure : CT Biopsy  Reason: history of breast cancer, estrogen receptor positive  - PET scan revealed likely bone and nodal metastases Dx: Malignant neoplasm of overlapping sites of right breast in female, estrogen receptor positive (HCC) [C50.811, Z17.0 (ICD-10-CM)]; Lesion of bone of thoracic spine [M89.9 (ICD-10-CM)]  Ordering Comments  Ensure sedation is provided during the bone biopsy to minimize discomfort -please include ER/PR/HER2 status - thanks    History : NM PET skull base to thigh , NM bone san whole body , CT chest abd pelv /w  Provider : Carlean Jews, NP  Provider contact : [phone]

## 2023-06-04 NOTE — Telephone Encounter (Signed)
 Called and spoke to patient over the phone to let her know the previously discussed bone biopsy had been changed to ultrasound guided biopsy of there supraclavicular lymph node because the bony lesions were very small and in difficult locations to biopsy. The lymph node was more superficial, easier to get to, and the biopsy itself, would likely be less painful. I was able to answer her questions. She know she can call back if further concerns arise.  Vincent Gros, NP

## 2023-06-04 NOTE — Telephone Encounter (Signed)
 I called and spoke with the patient at length regarding PET scan results. She understands that there is, likely, bone metastases in the spine, sacrum, ribs, and hips. I will order new CT bone biopsy to confirm diagnoses and also request ER/PR/HER2 status on specimen received. I have asked that sedation be given during CT biopsy procedure on the order. A follow up appointment will be scheduled with Dr. Mosetta Putt 3 to 4 days after the procedure. Treatment would be decided after the results of the biopsy were reviewed. I answered all questions and the patient voiced understanding.  -Vincent Gros, NP

## 2023-06-05 ENCOUNTER — Other Ambulatory Visit: Payer: Self-pay

## 2023-06-05 ENCOUNTER — Emergency Department (HOSPITAL_COMMUNITY)

## 2023-06-05 ENCOUNTER — Emergency Department (HOSPITAL_COMMUNITY)
Admission: EM | Admit: 2023-06-05 | Discharge: 2023-06-05 | Disposition: A | Discharge status: Home / Self Care | Payer: Self-pay | Attending: Emergency Medicine | Admitting: Emergency Medicine

## 2023-06-05 DIAGNOSIS — Z79899 Other long term (current) drug therapy: Secondary | ICD-10-CM | POA: Insufficient documentation

## 2023-06-05 DIAGNOSIS — Z853 Personal history of malignant neoplasm of breast: Secondary | ICD-10-CM | POA: Insufficient documentation

## 2023-06-05 DIAGNOSIS — R0781 Pleurodynia: Secondary | ICD-10-CM | POA: Insufficient documentation

## 2023-06-05 DIAGNOSIS — M25551 Pain in right hip: Secondary | ICD-10-CM | POA: Diagnosis not present

## 2023-06-05 DIAGNOSIS — W19XXXA Unspecified fall, initial encounter: Secondary | ICD-10-CM | POA: Insufficient documentation

## 2023-06-05 DIAGNOSIS — Y990 Civilian activity done for income or pay: Secondary | ICD-10-CM | POA: Diagnosis not present

## 2023-06-05 DIAGNOSIS — R0789 Other chest pain: Secondary | ICD-10-CM | POA: Insufficient documentation

## 2023-06-05 DIAGNOSIS — M25511 Pain in right shoulder: Secondary | ICD-10-CM | POA: Diagnosis not present

## 2023-06-05 DIAGNOSIS — E039 Hypothyroidism, unspecified: Secondary | ICD-10-CM | POA: Insufficient documentation

## 2023-06-05 MED ORDER — OXYCODONE-ACETAMINOPHEN 5-325 MG PO TABS: 1.0000 | ORAL_TABLET | Freq: Four times a day (QID) | ORAL | 0 refills | Status: DC | PRN

## 2023-06-05 MED ORDER — OXYCODONE-ACETAMINOPHEN 5-325 MG PO TABS
1.0000 | ORAL_TABLET | Freq: Once | ORAL | Status: AC
  Administered 2023-06-05: 1 via ORAL
  Filled 2023-06-05: qty 1

## 2023-06-05 NOTE — ED Provider Notes (Signed)
 [place] EMERGENCY DEPARTMENT AT [facility] Provider Note   CSN: [id number] Arrival date & time: 06/05/23  1010     History Chief Complaint  Patient presents with   [place] Daven[place]    [name] is a 63 y.o. female.  Patient with past history significant for breast cancer, hypothyroidism, and osteoporosis presents to the emergency department concerns of a fall.  She reports that she took a mechanical fall while at work yesterday.  States that her left leg may have potentially gotten on something resulting to her fall and land primary on the right side.  She endorses pain on the right hip, right ribs, and right shoulder.   Fall       Home Medications Prior to Admission medications   Medication Sig Start Date End Date Taking? Authorizing Provider  oxyCODONE-acetaminophen (PERCOCET/ROXICET) 5-325 MG tablet Take 1 tablet by mouth every 6 (six) hours as needed for severe pain (pain score 7-10). 06/05/23  Yes Maitland Lesiak A, PA-C  acetaminophen (TYLENOL) 500 MG tablet Take 1,000 mg by mouth every 6 (six) hours as needed.    [provider]  ALPRAZolam (XANAX) 1 MG tablet Take 1 tablet by mouth 3 (three) times daily.    [provider]  celecoxib (CELEBREX) 200 MG capsule Take 1 capsule (200 mg total) by mouth daily. Patient taking differently: Take 200 mg by mouth as needed. 10/31/13   Derl Flemings, PA-C  cloNIDine (CATAPRES) 0.1 MG tablet TAKE 1 TABLET BY MOUTH EVERY DAY 03/10/23   Boscia, Heather E, NP  gabapentin (NEURONTIN) 100 MG capsule Take 1 capsule every day by oral route at dinner. 09/28/22   [provider]  levothyroxine (SYNTHROID, LEVOTHROID) 50 MCG tablet Take 50 mcg by mouth daily before breakfast.    [provider]  omeprazole (PRILOSEC) 40 MG capsule Take 1 capsule (40 mg total) by mouth 2 (two) times daily. 02/18/23   Graciella Lavender, PA  ondansetron (ZOFRAN) 4 MG tablet Take 1 tablet (4 mg total) by mouth every 4  (four) hours as needed for nausea or vomiting. 05/07/23   Sharyon Deis, NP  venlafaxine XR (EFFEXOR-XR) 150 MG 24 hr capsule TAKE 1 CAPSULE BY MOUTH EVERY DAY WITH BREAKFAST 03/14/21   Sonja [place], MD  venlafaxine XR (EFFEXOR-XR) 75 MG 24 hr capsule TAKE 1 CAPSULE BY MOUTH DAILY WITH BREAKFAST. 05/07/21   Sonja [place], MD  zolpidem (AMBIEN) 5 MG tablet TAKE 1 TABLET BY MOUTH EVERYDAY AT BEDTIME 07/29/22   [provider]      Allergies    Adhesive [tape], Promethazine hcl, and Sulfa antibiotics    Review of Systems   Review of Systems  Musculoskeletal:  Positive for back pain.       Chest wall pain, shoulder pain  All other systems reviewed and are negative.   Physical Exam Updated Vital Signs BP (!) 146/86   Pulse 83   Temp 98.9 F (37.2 C) (Oral)   Resp 18   Ht 5\' 5"  (1.651 m)   Wt 58.1 kg   SpO2 96%   BMI 21.30 kg/m  Physical Exam Vitals and nursing note reviewed.  Constitutional:      General: She is not in acute distress.    Appearance: She is well-developed.  HENT:     Head: Normocephalic and atraumatic.  Eyes:     Conjunctiva/sclera: Conjunctivae normal.  Cardiovascular:     Rate and Rhythm: Normal rate and regular rhythm.  Heart sounds: No murmur heard. Pulmonary:     Effort: Pulmonary effort is normal. No respiratory distress.     Breath sounds: Normal breath sounds.  Abdominal:     Palpations: Abdomen is soft.     Tenderness: There is no abdominal tenderness.  Musculoskeletal:        General: Tenderness present. No swelling or deformity.     Cervical back: Neck supple.     Comments: TTP along the right hip/pelvis as well as the right chest wall and right shoulder  Skin:    General: Skin is warm and dry.     Capillary Refill: Capillary refill takes less than 2 seconds.  Neurological:     Mental Status: She is alert.  Psychiatric:        Mood and Affect: Mood normal.     ED Results / Procedures / Treatments   Labs (all labs ordered are  listed, but only abnormal results are displayed) Labs Reviewed - No data to display  EKG None  Radiology DG Lumbar Spine Complete Result Date: 06/05/2023 CLINICAL DATA:  Left leg "goes out" causing falls. EXAM: LUMBAR SPINE - COMPLETE 4+ VIEW COMPARISON:  None Available. FINDINGS: There is no evidence of lumbar spine fracture. Alignment is normal. Intervertebral disc spaces are maintained. IMPRESSION: Negative. Electronically Signed   By: Donnal Fusi M.D.   On: 06/05/2023 11:41   DG Pelvis 1-2 Views Result Date: 06/05/2023 CLINICAL DATA:  Left leg failure caused to fall. EXAM: PELVIS - 1-2 VIEW COMPARISON:  None Available. FINDINGS: There is no evidence of pelvic fracture or diastasis. No pelvic bone lesions are seen. IMPRESSION: Negative. Electronically Signed   By: Donnal Fusi M.D.   On: 06/05/2023 11:40   DG Ribs Unilateral W/Chest Right Result Date: 06/05/2023 CLINICAL DATA:  Fall, rib pain. EXAM: RIGHT RIBS AND CHEST - 3+ VIEW COMPARISON:  None Available. FINDINGS: No fracture or other bone lesions are seen involving the ribs. There is no evidence of pneumothorax or pleural effusion. Both lungs are clear. Heart size and mediastinal contours are within normal limits. RIGHT axillary clips.  Cholecystectomy clips. IMPRESSION: 1. No rib fracture identified. 2. No acute cardiopulmonary process. Electronically Signed   By: Deboraha Fallow M.D.   On: 06/05/2023 11:38   DG Shoulder Right Result Date: 06/05/2023 CLINICAL DATA:  Pain after fall EXAM: RIGHT SHOULDER - 3 VIEW COMPARISON:  None Available. FINDINGS: There is no evidence of fracture or dislocation. There is no evidence of arthropathy or other focal bone abnormality. Soft tissues are unremarkable. Surgical clips overlie the right hemithorax at the edge of the imaging field. IMPRESSION: No acute osseous abnormality. Electronically Signed   By: Adrianna Horde M.D.   On: 06/05/2023 11:30    Procedures Procedures   Medications Ordered in  ED Medications  oxyCODONE-acetaminophen (PERCOCET/ROXICET) 5-325 MG per tablet 1 tablet (1 tablet Oral Given 06/05/23 1117)    ED Course/ Medical Decision Making/ A&P                                 Medical Decision Making Amount and/or Complexity of Data Reviewed Radiology: ordered.  Risk Prescription drug management.   This patient presents to the ED for concern of fall.  Differential diagnosis includes hip fracture, hip fracture, shoulder fracture or dislocation   Imaging Studies ordered:  I ordered imaging studies including x-rays of the pelvis, lumbar spine, right ribs, and right shoulder.  I independently visualized and interpreted imaging which showed no abnormal finding seen on all x-ray imaging I agree with the radiologist interpretation   Medicines ordered and prescription drug management:  I ordered medication including Percocet for pain Reevaluation of the patient after these medicines showed that the patient improved I have reviewed the patients home medicines and have made adjustments as needed   Problem List / ED Course:  Patient presents emergency department today with concerns of mechanical fall.  She states that she fell at work yesterday due to feeling that her left foot got caught on the ground.  She states that she injured on her right side and this is where she is having pain.  No endorsed to the right hip, right chest wall, right shoulder.  Denies any pain in the right upper extremity.  Will conduct imaging in these areas for assessment. Exam reveals a stable hip with tenderness to the right pelvis.  Patient's vitals unremarkable.  No significant abnormal heart or lung sounds.  Percocet ordered for pain control. X-ray imaging negative for any acute fractures, dislocations, or other abnormal findings.  Advised patient of reassuring workup and continued use of anti-inflammatory medications such as ibuprofen or naproxen as well as Tylenol at home as needed. A  small prescription for Percocet ordered for pain control as needed. Return precautions discussed.  Encourage close follow-up with oncologist as she currently has a biopsy scheduled in the next 4 weeks.  Patient otherwise stable at this time for outpatient follow-up and discharged home.  Final Clinical Impression(s) / ED Diagnoses Final diagnoses:  Fall, initial encounter  Pain of right hip  Right-sided chest wall pain  Acute pain of right shoulder    Rx / DC Orders ED Discharge Orders          Ordered    oxyCODONE-acetaminophen (PERCOCET/ROXICET) 5-325 MG tablet  Every 6 hours PRN        06/05/23 1232              Calven Gilkes A, PA-C 06/05/23 1235    Tegeler, Marine Sia, MD 06/05/23 1552

## 2023-06-05 NOTE — ED Triage Notes (Addendum)
 Patient to ED following a fall at work yesterday. Left leg goes out on her at times but she did misstep with left foot does not know if this caused fall. Denies LOC, denies thinners and denies injury. She reports on 06/22/23 she is scheduled for biopsy of upper right shoulder for possible bone cancer.

## 2023-06-05 NOTE — Discharge Instructions (Signed)
 You are seen in the emergency department today for concerns of a fall which occurred yesterday.  Direct imaging of the ribs, pelvis, and shoulder were all thankfully reassuring with no abnormal finding seen.  You may continue to use Tylenol or ibuprofen as needed for pain.  For any concerns of new or worsening symptoms return to the emergency department. A small prescription for Percocet has been sent to your pharmacy for pain control as needed.

## 2023-06-07 ENCOUNTER — Telehealth: Payer: Self-pay | Admitting: Hematology

## 2023-06-07 ENCOUNTER — Telehealth: Payer: Self-pay

## 2023-06-07 NOTE — Telephone Encounter (Addendum)
 Called patient as per Dr. Maryalice Smaller, patient stated she is feeling ok no broken bones just bruised up and sore. She stated she would see Dr. Maryalice Smaller this week will send a message to Sherian Dimitri to get her scheduled this week per the message below.    ----- Message from Sonja [place] sent at 06/07/2023  7:53 AM EDT ----- I know Heather called her a few days ago and she is scheduled for biopsy on 4/29.  She had a fall and ED visit after that. Please check on pt to see if she wants to be seen this week, to review her PET and I will likely start her on oral therapy while we are waiting for biopsy results. Please schedule if she agrees. Thx   Gracie Lav

## 2023-06-08 NOTE — Assessment & Plan Note (Signed)
 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 S/p bilateral mastectomy with reconstruction and adjuvant radiation. (Implants ultimately removed 01/24/19 by Dr. Marieta Shorten.) -She started antiestrogen therapy Exemestane on 04/01/16, planned for 7 years. She experienced joint pain and hot flashes on exemestane, anastrozole, and tamoxifen. She opted to continue anastrozole. Given her continued difficulty with joint pain, as well as her low risk mammaprint, she stopped after she completed 5 years therapy in 03/2021 -she developed low appetite and weight loss in Feb 2025, CT and bone scan showed bone lesions. PET on 06/01/2023 showed diffuse hypermetabolic bone lesions and hypermetabolic small lymph node in the right axillary region and supraclavicular region on the right low neck, concerning for metastatic disease. -she is scheduled for [facility] biopsy on 4/26 -I recommend her to start anastrozole now, and will add Ribciclib if biopsy confirmed metastatic breast cancer

## 2023-06-09 ENCOUNTER — Encounter: Payer: Self-pay | Admitting: Hematology

## 2023-06-09 ENCOUNTER — Inpatient Hospital Stay: Attending: Nurse Practitioner | Admitting: Hematology

## 2023-06-09 DIAGNOSIS — Z170 Estrogen receptor positive status [ER+]: Secondary | ICD-10-CM

## 2023-06-09 DIAGNOSIS — C50811 Malignant neoplasm of overlapping sites of right female breast: Secondary | ICD-10-CM

## 2023-06-09 MED ORDER — ANASTROZOLE 1 MG PO TABS: 1.0000 mg | ORAL_TABLET | Freq: Every day | ORAL | 1 refills | Status: DC

## 2023-06-09 MED ORDER — ANASTROZOLE 1 MG PO TABS: 1.0000 mg | ORAL_TABLET | Freq: Every day | ORAL | 5 refills | Status: DC

## 2023-06-09 NOTE — Progress Notes (Signed)
 [facility]   [phone] Fax:([phone]) [phone]   Clinic Follow up Note   Patient Care Team: Fatima Sanger, FNP as PCP - General (Internal Medicine) Aplington, Illene Labrador, MD (Inactive) (Orthopedic Surgery) Jethro Bolus, MD (Ophthalmology) Janalyn Harder, MD (Inactive) (Dermatology) Malachy Mood, MD as Consulting Physician (Hematology) Emelia Loron, MD as Consulting Physician (General Surgery) Dorothy Puffer, MD as Consulting Physician (Radiation Oncology) Loa Socks, NP as Nurse Practitioner (Hematology and Oncology) 06/09/2023  I connected with [name] on 06/09/23 at 10:20 AM EDT by telephone and verified that I am speaking with the correct person using two identifiers.   I discussed the limitations, risks, security and privacy concerns of performing an evaluation and management service by telephone and the availability of in person appointments. I also discussed with the patient that there may be a patient responsible charge related to this service. The patient expressed understanding and agreed to proceed.   Patient's location:  Home  Provider's location:  Office    CHIEF COMPLAINT: Follow-up PET scan results   CURRENT THERAPY: pending anastrozole   Oncology history Malignant neoplasm of overlapping sites of right breast in female, estrogen receptor positive (HCC)  mpT2 pN1a, stage IA, G1,  ER positive, PR positive, HER-2 negative, mammaprint low risk luminal type A -Diagnosed in 02/2016. S/p bilateral mastectomy with reconstruction and adjuvant radiation. (Implants ultimately removed 01/24/19 by Dr. Leta Baptist.) -She started antiestrogen therapy Exemestane on 04/01/16, planned for 7 years. She experienced joint pain and hot flashes on exemestane, anastrozole, and tamoxifen. She opted to continue anastrozole. Given her continued difficulty with joint pain, as well as her low risk mammaprint, she stopped after she completed 5 years  therapy in 03/2021 -she developed low appetite and weight loss in Feb 2025, CT and bone scan showed bone lesions. PET on 06/01/2023 showed diffuse hypermetabolic bone lesions and hypermetabolic small lymph node in the right axillary region and supraclavicular region on the right low neck, concerning for metastatic disease. -she is scheduled for [facility] node biopsy on 4/26 -I recommend her to start anastrozole now, and will add Ribciclib if biopsy confirmed metastatic breast cancer   Assessment & Plan Recurrent Breast Cancer with Suspected Metastasis Bilateral mastectomy and anti-estrogen therapy previously administered. Current symptoms include significant tailbone pain, correlating with PET scan findings suggesting possible metastatic recurrence. Right shoulder pain not supported by PET scan. Suspected recurrent breast cancer with possible bone metastasis, likely stage IV. Biopsy planned to confirm diagnosis and assess receptor status. Discussed potential microscopic disease spread despite previous treatment and limitations of routine PET scans due to insurance constraints. Explained that even with bilateral mastectomy, microscopic disease can persist and lead to recurrence. If confirmed, this would be stage IV disease, which is not curable but treatable, with many patients responding well to anti-estrogen therapy for extended periods. - Initiate anastrozole therapy with a 90-day supply. - Schedule biopsy of the lymph node above the collarbone to confirm diagnosis and assess receptor status. - Consider radiation therapy to the tailbone for pain control post-biopsy results. - Consult with Doctor Mitzi Hansen regarding potential radiation therapy. 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  Pain Management Significant tailbone pain exacerbated by a recent fall at work, likely due to both the fall and underlying disease. Current pain medication provides temporary relief. Radiation therapy  considered for pain management, but she is concerned about the impact on work and daily life. Radiation therapy is purely for pain control and would  require daily sessions for 2-3 weeks, potentially affecting her ability to work. - Consider radiation therapy for tailbone pain post-biopsy results. - Provide work accommodations to allow her to work from home to manage pain.  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  -Schedule follow-up appointment on Jun 24, 2023, to review biopsy results and discuss further treatment options.     SUMMARY OF ONCOLOGIC HISTORY: Oncology History Overview Note  Cancer Staging Malignant neoplasm of overlapping sites of right breast in female, estrogen receptor positive (HCC) Staging form: Breast, AJCC 8th Edition - Clinical stage from 03/23/2016: Stage IIA (cT3, cN1, cM0, G2, ER: Positive, PR: Positive, HER2: Negative) - Signed by Malachy Mood, MD on 03/31/2016 - Pathologic stage from 05/19/2016: Stage IA (pT2(m), pN1a, cM0, G1, ER: Positive, PR: Positive, HER2: Negative) - Signed by Malachy Mood, MD on 06/18/2016     Malignant neoplasm of overlapping sites of right breast in female, estrogen receptor positive (HCC)  03/17/2016 Mammogram   B/l diagnostic mammogram and righ US showed a 3.6cm irregular mass in the right breast 11:00 position, posterior depth, there is a enlarged lymph node in the right axilla is highly suspicious for malignancy. additional 7 mm oval mass in the right breast lower outer quadrant is suspicious for malignancy.   03/23/2016 Initial Biopsy   Right breast 9:30 position biopsy  showed invasive ductal carcinoma, grade 1. 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   03/23/2016 Receptors her2   Both breast and node biopsy tumor ER 100% positive, PR 70-95% positive, HER-2 negative, Ki-67 40%   03/23/2016 Initial Diagnosis   Malignant neoplasm of upper-outer quadrant of right breast in female, estrogen receptor positive (HCC)   03/25/2016 Initial Biopsy   Right breast 11:00 position core needle biopsy showed invasive duct carcinoma, grade 2.    03/25/2016 Receptors her2   ER 95% positive, PR 90% positive, HER-2 negative, Ki-67 15%   03/25/2016 Miscellaneous   Mammaprint showed low risk type A with index +0.105   03/30/2016 Imaging   Bilateral breast MRI with and without contrast showed a large lobulated enhancing mass within the upper-outer and lower outer right breast with surrounding nodularity, measuring 6.1 x 4.4 x 5.6 cm. Multiple critically sick and right axillary lymph nodes are demonstrated measuring up to 1.5 cm.    04/01/2016 -  Anti-estrogen oral therapy   Exemestane 25 mg daily, plan for 7 years. Switched to Anastrozole 1mg  in 06/2017 due to joint pain and hot flashes. Due to persistent side effects I changed her to Tamoxifen in 09/2018. She stopped Tamoxifen and switched back to anastrozole in 01/2019 because it was more tolerable.    05/19/2016 Surgery   Bilateral mastectomy and right axillary regional lymph node resection.   05/19/2016 Pathology Results   -Right axillary regional lymph node resection revealed metastatic carcinoma in 2/7 lymph nodes. -Left simple mastectomy revealed lobular neoplasia and fibrocystic changes with adenosis and calcifications. 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 -Skin of the right mastectomy  flap was benign. -mpT2, pN1a   06/25/2016 - 08/11/2016 Radiation Therapy   Site/dose:    1. 4 field Right breast was treated to 50.4 Gy in 25 fractions at 1.8 Gy per fraction. 2. The Right breast was boosted to 10 Gy in 5 fractions at 2 Gy per fraction.   04/15/2017 Genetic Testing   The patient had genetic testing due to a personal history of breast cancer and family history of breast, ovarian, and colon cancer. The Common Hereditary Cancer Panel was ordered.  The Common Hereditary Cancer Panel offered by [facility] includes sequencing and/or deletion duplication testing of the following 47 genes: APC, ATM, AXIN2, BARD1, BMPR1A, BRCA1, BRCA2, BRIP1, CDH1, CDKN2A (p14ARF), CDKN2A (p16INK4a), CKD4, CHEK2, CTNNA1, DICER1, EPCAM (Deletion/duplication testing only), GREM1 (promoter region deletion/duplication testing only), KIT, MEN1, MLH1, MSH2, MSH3, MSH6, MUTYH, NBN, NF1, NHTL1, PALB2, PDGFRA, PMS2, POLD1, POLE, PTEN, RAD50, RAD51C, RAD51D, SDHB, SDHC, SDHD, SMAD4, SMARCA4. STK11, TP53, TSC1, TSC2, and VHL.  The following genes were evaluated for sequence changes only: SDHA and HOXB13 c.251G>A variant only.  Results: Negative, no pathogenic variants identified. The date of this test report is 04/15/2017   06/28/2017 Surgery   COMPLEX REVISION OF BACK SCAR and LEFT BREAST RECONSTRUCTION WITH SILICONE IMPLANT EXCHANGE AND ACELLULARDERMIS TO LEFT CHEST by Dr. Leta Baptist 06/28/17     Discussed the use of AI scribe software for clinical note transcription with the patient, who gave verbal consent to proceed.  History of Present Illness Miss Stogsdill, a 63 year old female with a history of breast cancer, presents with pain in her left shoulder, tailbone, and left leg. She reports that her left leg sometimes feels weak and gives way. These symptoms have been ongoing for approximately a month. Recently, she experienced a fall at work, landing on her chest and hip. She sought emergency care following the fall, where  imaging confirmed no fractures but suggested severe bruising. The fall has exacerbated her pain, particularly in her tailbone, which she describes as excruciating after sitting for extended periods. She is currently seeking work accommodations to work from home to avoid further falls.  A recent PET scan revealed significant disease in her tailbone area, which aligns with her reported pain. She previously underwent a bilateral mastectomy and five years of antiestrogen therapy for stage one breast cancer with lymph node involvement.     REVIEW OF SYSTEMS:   Constitutional: Denies fevers, chills or abnormal weight loss Eyes: Denies blurriness of vision Ears, nose, mouth, throat, and face: Denies mucositis or sore throat Respiratory: Denies cough, dyspnea or wheezes Cardiovascular: Denies palpitation, chest discomfort or lower extremity swelling Gastrointestinal:  Denies nausea, heartburn or change in bowel habits Skin: Denies abnormal skin rashes Lymphatics: Denies new lymphadenopathy or easy bruising Neurological:Denies numbness, tingling or new weaknesses Behavioral/Psych: Mood is stable, no new changes  All other systems were reviewed with the patient and are negative.  MEDICAL HISTORY:  Past Medical History:  Diagnosis Date   Anxiety    Anxiety disorder    Asthma    h/o asthma as a child   Breast cancer (HCC)    Cancer (HCC) 04/2016   right breast cancer   Cholelithiasis    Chronic kidney disease    obstruction of R kidney, ( not a stone) - currently resolved    Depression    Diverticulosis    DJD (degenerative joint disease)    hands & back    Dyspnea    resolved since she stopped smoking  Epigastric abdominal pain    Esophageal stricture    Family history of adverse reaction to anesthesia    daughter has N&V, takes long time to wake up    Family history of colon cancer    Family history of ovarian cancer    Family hx of colon cancer    Female pelvic peritoneal  adhesions 10/26/2012   Ganglion cyst    GERD (gastroesophageal reflux disease)    Headache    low grade currently , family history of migraines    Hemorrhoid    History of radiation therapy 05/03 - 08/11/2016   1. 4 field Right breast was treated to 50.4 Gy in 25 fractions at 1.8 Gy per fraction. 2. The Right breast was boosted to 10 Gy in 5 fractions at 2 Gy per fraction.   Hypertension    Hypothyroidism    Plantar fasciitis, bilateral    PONV (postoperative nausea and vomiting)    gets anxious with the mask on her face, also remarks that the scop. patch has helped in the past       SURGICAL HISTORY: Past Surgical History:  Procedure Laterality Date   ABDOMINAL HYSTERECTOMY     BREAST IMPLANT REMOVAL Bilateral 01/24/2019   Procedure: REMOVAL BREAST IMPLANTS;  Surgeon: Glenna Fellows, MD;  Location: [facility];  Service: Plastics;  Laterality: Bilateral;   BREAST RECONSTRUCTION WITH PLACEMENT OF TISSUE EXPANDER AND FLEX HD (ACELLULAR HYDRATED DERMIS) Bilateral 05/19/2016   Procedure: BREAST RECONSTRUCTION WITH PLACEMENT OF TISSUE EXPANDER AND ALLODERM PLACEMENT;  Surgeon: Glenna Fellows, MD;  Location: [facility];  Service: Plastics;  Laterality: Bilateral;   BREAST RECONSTRUCTION WITH PLACEMENT OF TISSUE EXPANDER AND FLEX HD (ACELLULAR HYDRATED DERMIS) Left 06/28/2017   Procedure: LEFT BREAST RECONSTRUCTION WITH SILICONE IMPLANT EXCHANGE AND ACELLULARDERMIS TO LEFT CHEST;  Surgeon: Glenna Fellows, MD;  Location: [facility];  Service: Plastics;  Laterality: Left;   CAPSULECTOMY Bilateral 01/24/2019   Procedure: CAPSULECTOMY;  Surgeon: Glenna Fellows, MD;  Location: [facility];  Service: Plastics;  Laterality: Bilateral;   CESAREAN SECTION  1988   CHOLECYSTECTOMY     HEMORRHOID SURGERY     LAPAROSCOPIC BILATERAL SALPINGECTOMY N/A 10/26/2012   Procedure: operative laparoscopy with lysis of adhesions;  Surgeon: Sherron Monday,  MD;  Location: [facility];  Service: Gynecology;  Laterality: N/A;   LESION EXCISION WITH COMPLEX REPAIR Right 06/09/2019   Procedure: COMPLEX REPAIR RIGHT CHEST 15CM;  Surgeon: Glenna Fellows, MD;  Location: [facility];  Service: Plastics;  Laterality: Right;   MASTECTOMY Bilateral    MASTECTOMY WITH RADIOACTIVE SEED GUIDED EXCISION AND AXILLARY SENTINEL LYMPH NODE BIOPSY Bilateral 05/19/2016   Procedure: RIGHT SKIN SPARING MASTECTOMY WITH RIGHT RADIOACTIVE SEED TARGETED DISSECTION AND RIGHT SENTINEL LYMPH NODE BIOPSY, LEFT PROPHYLACTIC SKIN SPARING MASTECTOMY;  Surgeon: Emelia Loron, MD;  Location: [facility];  Service: General;  Laterality: Bilateral;   REMOVAL OF BILATERAL TISSUE EXPANDERS WITH PLACEMENT OF BILATERAL BREAST IMPLANTS Bilateral 01/29/2017   Procedure: REMOVAL OF BILATERAL TISSUE EXPANDERS WITH PLACEMENT OF BILATERAL SILICONE BREAST IMPLANTS, ALLODERM TO LEFT BREAST RECONSTRUCTION;  RIGHT LATISSUMUS FLAP;  Surgeon: Glenna Fellows, MD;  Location: [facility];  Service: Plastics;  Laterality: Bilateral;  Requesting RNFA   SCAR REVISION Right 06/28/2017   Procedure: COMPLEX REVISION OF BACK SCAR;  Surgeon: Glenna Fellows, MD;  Location:  SUR[facility]ERY [facility];  Service: Plastics;  Laterality: Right;   TONSILLECTOMY     TUBAL LIGATION  urologic surgery for ureteropelvic junction obstruction      I have reviewed the social history and family history with the patient and they are unchanged from previous note.  ALLERGIES:  is allergic to adhesive [tape], promethazine hcl, and sulfa antibiotics.  MEDICATIONS:  Current Outpatient Medications  Medication Sig Dispense Refill   acetaminophen (TYLENOL) 500 MG tablet Take 1,000 mg by mouth every 6 (six) hours as needed.     ALPRAZolam (XANAX) 1 MG tablet Take 1 tablet by mouth 3 (three) times daily.     anastrozole (ARIMIDEX) 1 MG tablet Take 1 tablet (1 mg total) by mouth daily. 90 tablet 1   celecoxib  (CELEBREX) 200 MG capsule Take 1 capsule (200 mg total) by mouth daily. (Patient taking differently: Take 200 mg by mouth as needed.) 30 capsule 2   cloNIDine (CATAPRES) 0.1 MG tablet TAKE 1 TABLET BY MOUTH EVERY DAY 90 tablet 1   gabapentin (NEURONTIN) 100 MG capsule Take 1 capsule every day by oral route at dinner.     levothyroxine (SYNTHROID, LEVOTHROID) 50 MCG tablet Take 50 mcg by mouth daily before breakfast.     omeprazole (PRILOSEC) 40 MG capsule Take 1 capsule (40 mg total) by mouth 2 (two) times daily. 180 capsule 2   ondansetron (ZOFRAN) 4 MG tablet Take 1 tablet (4 mg total) by mouth every 4 (four) hours as needed for nausea or vomiting. 30 tablet 2   oxyCODONE-acetaminophen (PERCOCET/ROXICET) 5-325 MG tablet Take 1 tablet by mouth every 6 (six) hours as needed for severe pain (pain score 7-10). 8 tablet 0   venlafaxine XR (EFFEXOR-XR) 150 MG 24 hr capsule TAKE 1 CAPSULE BY MOUTH EVERY DAY WITH BREAKFAST 90 capsule 1   venlafaxine XR (EFFEXOR-XR) 75 MG 24 hr capsule TAKE 1 CAPSULE BY MOUTH DAILY WITH BREAKFAST. 90 capsule 2   zolpidem (AMBIEN) 5 MG tablet TAKE 1 TABLET BY MOUTH EVERYDAY AT BEDTIME     No current facility-administered medications for this visit.    PHYSICAL EXAMINATION: Not performed   LABORATORY DATA:  I have reviewed the data as listed    Latest Ref Rng & Units 05/07/2023    1:35 PM 05/18/2022    1:45 PM 09/18/2021   10:57 AM  CBC  WBC 4.0 - 10.5 K/uL 4.6  5.7  4.5   Hemoglobin 12.0 - 15.0 g/dL 86.5  78.4  69.6   Hematocrit 36.0 - 46.0 % 45.9  41.8  40.9   Platelets 150 - 400 K/uL 281  267  263         Latest Ref Rng & Units 05/07/2023    1:35 PM 05/18/2022    1:45 PM 09/18/2021   10:57 AM  CMP  Glucose 70 - 99 mg/dL 295  284  132   BUN 8 - 23 mg/dL 7  9  14    Creatinine 0.44 - 1.00 mg/dL 4.40  1.02  7.25   Sodium 135 - 145 mmol/L 140  140  138   Potassium 3.5 - 5.1 mmol/L 3.3  4.1  4.0   Chloride 98 - 111 mmol/L 100  106  104   CO2 22 - 32 mmol/L  31  25  26    Calcium 8.9 - 10.3 mg/dL 9.0  9.6  8.9   Total Protein 6.5 - 8.1 g/dL 6.9  6.9  6.7   Total Bilirubin 0.0 - 1.2 mg/dL 0.4  0.4  0.4   Alkaline Phos 38 - 126 U/L 90  73  81   AST 15 - 41 U/L 20  29  25    ALT 0 - 44 U/L 16  28  26        RADIOGRAPHIC STUDIES: I have personally reviewed the radiological images as listed and agreed with the findings in the report. No results found.     I discussed the assessment and treatment plan with the patient. The patient was provided an opportunity to ask questions and all were answered. The patient agreed with the plan and demonstrated an understanding of the instructions.   The patient was advised to call back or seek an in-person evaluation if the symptoms worsen or if the condition fails to improve as anticipated.  I provided 25 minutes of non face-to-face telephone visit time during this encounter, and > 50% was spent counseling as documented under my assessment & plan.     Sonja Waldorf, MD 06/09/23

## 2023-06-14 ENCOUNTER — Telehealth: Payer: Self-pay

## 2023-06-14 ENCOUNTER — Other Ambulatory Visit: Payer: Self-pay

## 2023-06-14 NOTE — Telephone Encounter (Signed)
 Reached out to the patient to let her know that I checked the status of her work forms. Patient provided a case number and name for the forms to be in attention to when they are completed. Case number: [id number] Attention: Austine Lefort Fax # : [phone].

## 2023-06-14 NOTE — Telephone Encounter (Signed)
 Received telephone call from the patient stating she has called [employer] numerous times and left several messages with no call back regarding her Accomodation Form for Work. Patient stated she has called on, 06/09/23,06/10/23, 06/11/23, and today 06/14/23 and spoke with Dr. Candise Chambers CMA's to express the urgency and importance of these forms being completed asap and that she still has not received a call back from the [employer] PA Disability Team. Patient requesting a call back from the [facility] PA Disability Team. Notified [employer]ML[employer] PA Disability Team, Dr. Maryalice Smaller, and Sammi Crick.

## 2023-06-16 ENCOUNTER — Ambulatory Visit: Payer: BC Managed Care – PPO | Admitting: Internal Medicine

## 2023-06-17 ENCOUNTER — Other Ambulatory Visit: Payer: Self-pay

## 2023-06-18 ENCOUNTER — Telehealth: Payer: Self-pay

## 2023-06-18 DIAGNOSIS — Z853 Personal history of malignant neoplasm of breast: Secondary | ICD-10-CM | POA: Diagnosis not present

## 2023-06-18 DIAGNOSIS — R110 Nausea: Secondary | ICD-10-CM | POA: Diagnosis not present

## 2023-06-18 DIAGNOSIS — K219 Gastro-esophageal reflux disease without esophagitis: Secondary | ICD-10-CM | POA: Diagnosis not present

## 2023-06-18 DIAGNOSIS — E039 Hypothyroidism, unspecified: Secondary | ICD-10-CM | POA: Diagnosis not present

## 2023-06-18 NOTE — Telephone Encounter (Signed)
 Notified Patient of completion of Medical Accommodation Request Form. Fax transmission confirmation received. Copy of form placed for pickup as requested by Patient. No other needs or concerns noted at this time.

## 2023-06-19 ENCOUNTER — Other Ambulatory Visit: Payer: Self-pay | Admitting: Radiology

## 2023-06-19 DIAGNOSIS — C50811 Malignant neoplasm of overlapping sites of right female breast: Secondary | ICD-10-CM

## 2023-06-22 ENCOUNTER — Other Ambulatory Visit: Payer: Self-pay

## 2023-06-22 ENCOUNTER — Encounter (HOSPITAL_COMMUNITY): Payer: Self-pay

## 2023-06-22 ENCOUNTER — Ambulatory Visit (HOSPITAL_COMMUNITY)
Admission: RE | Admit: 2023-06-22 | Discharge: 2023-06-22 | Disposition: A | Discharge status: Home / Self Care | Source: Ambulatory Visit | Attending: Nurse Practitioner | Admitting: Nurse Practitioner

## 2023-06-22 ENCOUNTER — Ambulatory Visit (HOSPITAL_COMMUNITY)
Admission: RE | Admit: 2023-06-22 | Discharge: 2023-06-22 | Disposition: A | Discharge status: Home / Self Care | Source: Ambulatory Visit | Attending: Nurse Practitioner

## 2023-06-22 DIAGNOSIS — C50811 Malignant neoplasm of overlapping sites of right female breast: Secondary | ICD-10-CM | POA: Insufficient documentation

## 2023-06-22 DIAGNOSIS — Z170 Estrogen receptor positive status [ER+]: Secondary | ICD-10-CM

## 2023-06-22 DIAGNOSIS — Z01818 Encounter for other preprocedural examination: Secondary | ICD-10-CM | POA: Insufficient documentation

## 2023-06-22 DIAGNOSIS — R9389 Abnormal findings on diagnostic imaging of other specified body structures: Secondary | ICD-10-CM | POA: Diagnosis not present

## 2023-06-22 DIAGNOSIS — D487 Neoplasm of uncertain behavior of other specified sites: Secondary | ICD-10-CM | POA: Diagnosis not present

## 2023-06-22 DIAGNOSIS — C770 Secondary and unspecified malignant neoplasm of lymph nodes of head, face and neck: Secondary | ICD-10-CM | POA: Diagnosis not present

## 2023-06-22 DIAGNOSIS — Z853 Personal history of malignant neoplasm of breast: Secondary | ICD-10-CM | POA: Diagnosis not present

## 2023-06-22 DIAGNOSIS — C773 Secondary and unspecified malignant neoplasm of axilla and upper limb lymph nodes: Secondary | ICD-10-CM | POA: Diagnosis not present

## 2023-06-22 MED ORDER — DIAZEPAM 5 MG PO TABS
ORAL_TABLET | ORAL | Status: AC
  Filled 2023-06-22: qty 1

## 2023-06-22 MED ORDER — LIDOCAINE HCL 1 % IJ SOLN
INTRAMUSCULAR | Status: AC
  Filled 2023-06-22: qty 20

## 2023-06-22 MED ORDER — DIAZEPAM 5 MG PO TABS
5.0000 mg | ORAL_TABLET | Freq: Once | ORAL | Status: AC
  Administered 2023-06-22: 5 mg via ORAL

## 2023-06-22 MED ORDER — SODIUM CHLORIDE 0.9 % IV SOLN
INTRAVENOUS | Status: DC
Start: 2023-06-22 — End: 2023-06-22

## 2023-06-22 NOTE — Procedures (Signed)
 Vascular and Interventional Radiology Procedure Note  Patient: [name] DOB: [birth year] Medical Record Number: [id number] Note Date/Time: 06/22/23 12:38 PM   Performing Physician: Art Largo, MD Assistant(s): None  Diagnosis: Hx breast CA. HM R supraclavicular LN   Procedure: RIGHT SUPRACLAVICULAR LYMPH NODE BIOPSY  Anesthesia: Local Anesthetic Complications: None Estimated Blood Loss: Minimal Specimens: Sent for Pathology  Findings:  Successful Ultrasound-guided biopsy of R supraclavicular LN A total of 3 samples were obtained. Hemostasis of the tract was achieved using Manual Pressure.  Plan: Bed rest for 1 hours.  See detailed procedure note with images in PACS. The patient tolerated the procedure well without incident or complication and was returned to Recovery in stable condition.    Art Largo, MD [facility] Radiology   Pager. [phone] Clinic. [phone]

## 2023-06-22 NOTE — Discharge Instructions (Addendum)
 Please call Interventional Radiology clinic [phone] with any questions or concerns.   You may remove your dressing and shower tomorrow.    Needle Biopsy, Care After These instructions tell you how to care for yourself after your procedure. Your doctor may also give you more specific instructions. Call your doctor if [name] any problems or questions. What can I expect after the procedure? After the procedure, it is common to have: Soreness. Bruising. Mild pain. Follow these instructions at home:  Return to your normal activities as told by your doctor. Ask your doctor what activities are safe for you. Take over-the-counter and prescription medicines only as told by your doctor. Wash your hands with soap and water before you change your bandage (dressing). If you cannot use soap and water, use hand sanitizer. Follow instructions from your doctor about: How to take care of your puncture site. When and how to change your bandage. When to remove your bandage. Check your puncture site every day for signs of infection. Watch for: Redness, swelling, or pain. Fluid or blood. Pus or a bad smell. Warmth. Do not take baths, swim, or use a hot tub until your doctor approves. Ask your doctor if you may take showers. You may only be allowed to take sponge baths. Keep all follow-up visits as told by your doctor. This is important. Contact a doctor if you have: A fever. Redness, swelling, or pain at the puncture site, and it lasts longer than a few days. Fluid, blood, or pus coming from the puncture site. Warmth coming from the puncture site. Get help right away if: You have a lot of bleeding from the puncture site. Summary After the procedure, it is common to have soreness, bruising, or mild pain at the puncture site. Check your puncture site every day for signs of infection, such as redness, swelling, or pain. Get help right away if you have severe bleeding from your puncture  site. This information is not intended to replace advice given to you by your health care provider. Make sure you discuss any questions you have with your healthcare provider. Document Revised: 08/10/2019 Document Reviewed: 08/10/2019 Elsevier Patient Education  2022 [employer].

## 2023-06-22 NOTE — Progress Notes (Signed)
 1350 Back from needle biopsy procedure bandaid to right collar bone, dry, clean and intact. Having pain at level 8.  Call to Dr. Darylene Epley asked to take her home pain med once she is home since she didn't receive sedation.  "Stated she was fine to take her home pain medication."

## 2023-06-23 ENCOUNTER — Other Ambulatory Visit: Payer: Self-pay

## 2023-06-23 DIAGNOSIS — C50011 Malignant neoplasm of nipple and areola, right female breast: Secondary | ICD-10-CM

## 2023-06-23 DIAGNOSIS — C50811 Malignant neoplasm of overlapping sites of right female breast: Secondary | ICD-10-CM

## 2023-06-24 ENCOUNTER — Inpatient Hospital Stay: Attending: Nurse Practitioner

## 2023-06-24 ENCOUNTER — Inpatient Hospital Stay: Admitting: Hematology

## 2023-06-24 ENCOUNTER — Encounter: Payer: Self-pay | Admitting: Hematology

## 2023-06-24 VITALS — BP 137/81 | HR 87 | Temp 98.0°F | Resp 16 | Ht 65.0 in | Wt 128.1 lb

## 2023-06-24 DIAGNOSIS — Z79811 Long term (current) use of aromatase inhibitors: Secondary | ICD-10-CM | POA: Insufficient documentation

## 2023-06-24 DIAGNOSIS — G893 Neoplasm related pain (acute) (chronic): Secondary | ICD-10-CM | POA: Diagnosis not present

## 2023-06-24 DIAGNOSIS — C50011 Malignant neoplasm of nipple and areola, right female breast: Secondary | ICD-10-CM

## 2023-06-24 DIAGNOSIS — C773 Secondary and unspecified malignant neoplasm of axilla and upper limb lymph nodes: Secondary | ICD-10-CM | POA: Diagnosis not present

## 2023-06-24 DIAGNOSIS — Z9013 Acquired absence of bilateral breasts and nipples: Secondary | ICD-10-CM | POA: Diagnosis not present

## 2023-06-24 DIAGNOSIS — Z170 Estrogen receptor positive status [ER+]: Secondary | ICD-10-CM

## 2023-06-24 DIAGNOSIS — Z87891 Personal history of nicotine dependence: Secondary | ICD-10-CM | POA: Diagnosis not present

## 2023-06-24 DIAGNOSIS — C7951 Secondary malignant neoplasm of bone: Secondary | ICD-10-CM | POA: Diagnosis not present

## 2023-06-24 DIAGNOSIS — C50811 Malignant neoplasm of overlapping sites of right female breast: Secondary | ICD-10-CM | POA: Insufficient documentation

## 2023-06-24 DIAGNOSIS — Z1721 Progesterone receptor positive status: Secondary | ICD-10-CM | POA: Insufficient documentation

## 2023-06-24 DIAGNOSIS — Z1732 Human epidermal growth factor receptor 2 negative status: Secondary | ICD-10-CM | POA: Diagnosis not present

## 2023-06-24 LAB — CBC WITH DIFFERENTIAL (CANCER CENTER ONLY)
Abs Immature Granulocytes: 0.01 10*3/uL (ref 0.00–0.07)
Basophils Absolute: 0.1 10*3/uL (ref 0.0–0.1)
Basophils Relative: 2 %
Eosinophils Absolute: 0.1 10*3/uL (ref 0.0–0.5)
Eosinophils Relative: 2 %
HCT: 47.5 % — ABNORMAL HIGH (ref 36.0–46.0)
Hemoglobin: 15.5 g/dL — ABNORMAL HIGH (ref 12.0–15.0)
Immature Granulocytes: 0 %
Lymphocytes Relative: 45 %
Lymphs Abs: 2.2 10*3/uL (ref 0.7–4.0)
MCH: 26.9 pg (ref 26.0–34.0)
MCHC: 32.6 g/dL (ref 30.0–36.0)
MCV: 82.5 fL (ref 80.0–100.0)
Monocytes Absolute: 0.5 10*3/uL (ref 0.1–1.0)
Monocytes Relative: 10 %
Neutro Abs: 2 10*3/uL (ref 1.7–7.7)
Neutrophils Relative %: 41 %
Platelet Count: 266 10*3/uL (ref 150–400)
RBC: 5.76 MIL/uL — ABNORMAL HIGH (ref 3.87–5.11)
RDW: 18.7 % — ABNORMAL HIGH (ref 11.5–15.5)
WBC Count: 4.9 10*3/uL (ref 4.0–10.5)
nRBC: 0 % (ref 0.0–0.2)

## 2023-06-24 LAB — CMP (CANCER CENTER ONLY)
ALT: 16 U/L (ref 0–44)
AST: 20 U/L (ref 15–41)
Albumin: 4.3 g/dL (ref 3.5–5.0)
Alkaline Phosphatase: 81 U/L (ref 38–126)
Anion gap: 7 (ref 5–15)
BUN: 8 mg/dL (ref 8–23)
CO2: 32 mmol/L (ref 22–32)
Calcium: 9.2 mg/dL (ref 8.9–10.3)
Chloride: 100 mmol/L (ref 98–111)
Creatinine: 0.92 mg/dL (ref 0.44–1.00)
GFR, Estimated: >60 mL/min (ref >60)
Glucose, Bld: 93 mg/dL (ref 70–99)
Potassium: 3.8 mmol/L (ref 3.5–5.1)
Sodium: 139 mmol/L (ref 135–145)
Total Bilirubin: 0.5 mg/dL (ref 0.0–1.2)
Total Protein: 6.7 g/dL (ref 6.5–8.1)

## 2023-06-24 LAB — SURGICAL PATHOLOGY

## 2023-06-24 NOTE — Assessment & Plan Note (Signed)
 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 S/p bilateral mastectomy with reconstruction and adjuvant radiation. (Implants ultimately removed 01/24/19 by Dr. Marieta Shorten.) -She started antiestrogen therapy Exemestane  on 04/01/16, planned for 7 years. She experienced joint pain and hot flashes on exemestane , anastrozole , and tamoxifen . She opted to continue anastrozole . Given her continued difficulty with joint pain, as well as her low risk mammaprint, she stopped after she completed 5 years therapy in 03/2021 -she developed low appetite and weight loss in Feb 2025, CT and bone scan showed bone lesions. PET on 06/01/2023 showed diffuse hypermetabolic bone lesions and hypermetabolic small lymph node in the right axillary region and supraclavicular region on the right low neck, concerning for metastatic disease. -Elim node biopsy on 06/22/2023 confirmed metastatic breast cancer, will request NGS  -she started anastrozole  in 05/2023, will add Ribciclib and zometa 

## 2023-06-25 ENCOUNTER — Telehealth: Payer: Self-pay

## 2023-06-25 ENCOUNTER — Telehealth: Payer: Self-pay | Admitting: Hematology

## 2023-06-25 ENCOUNTER — Encounter: Payer: Self-pay | Admitting: Hematology

## 2023-06-25 ENCOUNTER — Telehealth: Payer: Self-pay | Admitting: Pharmacist

## 2023-06-25 ENCOUNTER — Other Ambulatory Visit (HOSPITAL_COMMUNITY): Payer: Self-pay

## 2023-06-25 DIAGNOSIS — C50811 Malignant neoplasm of overlapping sites of right female breast: Secondary | ICD-10-CM

## 2023-06-25 LAB — CANCER ANTIGEN 27.29: CA 27.29: 1294.6 U/mL — ABNORMAL HIGH (ref 0.0–38.6)

## 2023-06-25 MED ORDER — RIBOCICLIB SUCC (600 MG DOSE) 200 MG PO TBPK: 600.0000 mg | ORAL_TABLET | Freq: Every day | ORAL | 0 refills | Status: DC

## 2023-06-25 NOTE — Telephone Encounter (Signed)
 Oral Oncology Pharmacist Encounter  Received new prescription for Kisqali (ribociclib) for the treatment of metastatic ER/PR positive, HER-2 negative breast cancer in conjunction with anastrozole , planned duration until disease progression or unacceptable drug toxicity. Planned start date for Kisqali per MD is after radiation.   CBC w/ Diff and CMP from 06/24/23 assessed, no relevant lab abnormalities requiring baseline dose adjustment required at this time. Baseline EKG obtained on 06/24/23 - QTcB 439 ms. Prescription dose and frequency assessed for appropriateness. Patient will dose-escalate first cycle starting at 2 tabs daily for week 1, and then if tolerated increase to 3 tabs daily for weeks 2 and 3 of the first 28 day cycle.  Current medication list in Epic reviewed, DDIs with Kisqali identified: Category D drug-drug interaction between Kisqali and Xanax  - due to CYP3A4 inhibiton, Kisqali can increase serum concentrations of Xanax , thus possibility for increased side effects for Xanax  (lethargy, sedation). Will discuss with patient risk and consideration of dose decreasing Xanax  while on both agents.  Category C DDI between Kisqali and Ondansetron  due to risk of Qtc prolongation. Noted patient only taking PRN and PO route, risk higher with IV administration. No change in therapy warranted at this time. Patient has baseline EKG from 06/24/23. Category C drug-drug interaction between Kisqali and Oxycodone  - Kisqali may increase serum concentrations of oxycodone , thus risk for sedation, lethargy, etc. Via CYP3A4 inhibition.  Will confirm if patient is still taking as medication was dispensed 06/05/23 for 2 day supply based on patient's medication dispense history.  Evaluated chart and no patient barriers to medication adherence noted.   Patient agreement for treatment documented in MD note on 06/24/23.  Prescription has been e-scribed to the [facility] for benefits analysis and  approval.  Oral Oncology Clinic will continue to follow for insurance authorization, copayment issues, initial counseling and start date.  Jude Norton, PharmD, BCPS, BCOP Hematology/Oncology Clinical Pharmacist [facility] and [facility] Oral Chemotherapy Navigation Clinics [phone] 06/25/2023 11:42 AM

## 2023-06-25 NOTE — Telephone Encounter (Addendum)
 Oral Oncology Patient Advocate Encounter  Prior Authorization for [name] has been approved.    PA# [id number]  Effective dates: 06/25/23 through 06/24/24  Patient must fill through [facility] per insurance.   Script and all supporting documents sent to [facility] for processing and fulfillment.    Hansel Ley, CPhT Pharmacy Technician Coordinator [facility] [phone] (Ph) 06/25/2023 1:34 PM

## 2023-06-25 NOTE — Progress Notes (Signed)
 [facility]   Telephone:(336) [phone] Fax:([phone]) [phone]   Clinic Follow up Note   Patient Care Team: Jhon Moselle, FNP as PCP - General (Internal Medicine) Aplington, Robbie Chiles, MD (Inactive) (Orthopedic Surgery) Albert Huff, MD (Ophthalmology) Devon Fogo, MD (Inactive) (Dermatology) Sonja Pollock Pines, MD as Consulting Physician (Hematology) Enid Harry, MD as Consulting Physician (General Surgery) Johna Myers, MD as Consulting Physician (Radiation Oncology) Percival Brace, NP as Nurse Practitioner (Hematology and Oncology)  Date of Service:  06/24/2023  CHIEF COMPLAINT: f/u of biopsy results  CURRENT THERAPY:  Anastrozole  1 mg daily, will add on ribociclib  Oncology History   Malignant neoplasm of overlapping sites of right breast in female, estrogen receptor positive (HCC)  mpT2 pN1a, stage IA, G1,  ER positive, PR positive, HER-2 negative, mammaprint low risk luminal type A -Diagnosed in 02/2016. S/p bilateral mastectomy with reconstruction and adjuvant radiation. (Implants ultimately removed 01/24/19 by Dr. Marieta Shorten.) -She started antiestrogen therapy Exemestane  on 04/01/16, planned for 7 years. She experienced joint pain and hot flashes on exemestane , anastrozole , and tamoxifen . She opted to continue anastrozole . Given her continued difficulty with joint pain, as well as her low risk mammaprint, she stopped after she completed 5 years therapy in 03/2021 -she developed low appetite and weight loss in Feb 2025, CT and bone scan showed bone lesions. PET on 06/01/2023 showed diffuse hypermetabolic bone lesions and hypermetabolic small lymph node in the right axillary region and supraclavicular region on the right low neck, concerning for metastatic disease. -[place] node biopsy on 06/22/2023 confirmed metastatic breast cancer, will request NGS  -she started anastrozole  in 05/2023, will add Ribciclib and zometa    Assessment & Plan Metastatic breast cancer,  ER+/PR+, HER2 1+ Newly diagnosed stage IV metastatic breast cancer with recurrence in bone and lymph nodes. Previous cancer was ER-positive, HER2-negative. Awaiting biopsy results to confirm receptor status. Treatment aims to control disease and prolong life. Discussed anti-estrogen and targeted therapies. Explained disease is treatable but not curable, with average survival of 4-5 years. Discussed Kisqali side effects: low blood counts, diarrhea, and heart rhythm impact. Emphasized monitoring blood counts and heart rhythm. Potential future chemotherapy if current treatments fail. - Continue anastrozole  therapy - Await biopsy results for receptor status - Add Kisqali post receptor status confirmation - Order EKG for heart rhythm monitoring - Coordinate with oral pharmacist for Kisqali administration and drug interactions - Regularly monitor blood counts - Educate on infection prevention - Discuss Kisqali side effects  Bone pain due to metastasis Bone pain in left tailbone due to metastasis, significantly impacting daily activities. Discussed palliative radiation therapy for symptom relief, noting it is not curative. Radiation duration will be 2-3 weeks. Discussed Zometa  for bone strengthening and fracture prevention. - Refer to radiation oncology for palliative radiation - Coordinate with Dr. Jeryl Moris for radiation treatment - Discuss radiation side effects - Consider Zometa  infusion post dental clearance  Weight loss due to cancer Significant weight loss over 2-3 months with loss of appetite and dysphagia, likely due to cancer recurrence. Emphasized maintaining nutrition and weight with high-protein, high-calorie diet and exercise. Discussed cancer's role in nutritional depletion and weight loss. - Encourage high-protein, high-calorie diet - Recommend light exercise, e.g., walking or stationary biking - Monitor weight and nutritional status  GOC discussion and emotional stree -We again  discussed the incurable nature of her cancer, and the overall prognosis, and the goal of therapy is palliative to improve her quality of life, and prolong her life. - She is full  code now  Low back pain - Secondary to extensive bone metastasis in the sacrum, will refer her to radiation oncology Dr. Jeryl Moris for short course of palliative radiation  Plan -baseline EKG obtained today  -path came back ER 70%+, PR 80%+, HER2 1+  -will call in ribociclib 600 mg daily 3 weeks on and 1 week off, to start after her palliative RT -urgent referral to rad/onc Dr. Jeryl Moris  - follow-up in 3 weeks - Will refer her to social worker for counseling     SUMMARY OF ONCOLOGIC HISTORY: Oncology History Overview Note  Cancer Staging Malignant neoplasm of overlapping sites of right breast in female, estrogen receptor positive (HCC) Staging form: Breast, AJCC 8th Edition - Clinical stage from 03/23/2016: Stage IIA (cT3, cN1, cM0, G2, ER: Positive, PR: Positive, HER2: Negative) - Signed by Sonja [place], MD on 03/31/2016 - Pathologic stage from 05/19/2016: Stage IA (pT2(m), pN1a, cM0, G1, ER: Positive, PR: Positive, HER2: Negative) - Signed by Sonja T[place], MD on 06/18/2016     Malignant neoplasm of overlapping sites of right breast in female, estrogen receptor positive (HCC)  03/17/2016 Mammogram   B/l diagnostic mammogram and righ US  showed a 3.6cm irregular mass in the right breast 11:00 position, posterior depth, there is a enlarged lymph node in the right axilla is highly suspicious for malignancy. additional 7 mm oval mass in the right breast lower outer quadrant is suspicious for malignancy.   03/23/2016 Initial Biopsy   Right breast 9:30 position biopsy showed invasive ductal carcinoma, grade 1. 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   03/23/2016 Receptors her2   Both breast and node biopsy tumor ER 100% positive, PR 70-95% positive, HER-2 negative, Ki-67 40%   03/23/2016 Initial Diagnosis    Malignant neoplasm of upper-outer quadrant of right breast in female, estrogen receptor positive (HCC)   03/25/2016 Initial Biopsy   Right breast 11:00 position core needle biopsy showed invasive duct carcinoma, grade 2.    03/25/2016 Receptors her2   ER 95% positive, PR 90% positive, HER-2 negative, Ki-67 15%   03/25/2016 Miscellaneous   Mammaprint showed low risk type A with index +0.105   03/30/2016 Imaging   Bilateral breast MRI with and without contrast showed a large lobulated enhancing mass within the upper-outer and lower outer right breast with surrounding nodularity, measuring 6.1 x 4.4 x 5.6 cm. Multiple critically sick and right axillary lymph nodes are demonstrated measuring up to 1.5 cm.    04/01/2016 -  Anti-estrogen oral therapy   Exemestane  25 mg daily, plan for 7 years. Switched to Anastrozole  1mg  in 06/2017 due to joint pain and hot flashes. Due to persistent side effects I changed her to Tamoxifen  in 09/2018. She stopped Tamoxifen  and switched back to anastrozole  in 01/2019 because it was more tolerable.    05/19/2016 Surgery   Bilateral mastectomy and right axillary regional lymph node resection.   05/19/2016 Pathology Results   -Right axillary regional lymph node resection revealed metastatic carcinoma in 2/7 lymph nodes. -Left simple mastectomy revealed lobular neoplasia and fibrocystic changes with adenosis and calcifications. 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 -Skin of the right mastectomy flap was benign. -mpT2, pN1a   06/25/2016 - 08/11/2016 Radiation Therapy   Site/dose:    1. 4 field Right breast was treated to 50.4 Gy in 25 fractions at 1.8  Gy per fraction. 2. The Right breast was boosted to 10 Gy in 5 fractions at 2 Gy per fraction.   04/15/2017  Genetic Testing   The patient had genetic testing due to a personal history of breast cancer and family history of breast, ovarian, and colon cancer. The Common Hereditary Cancer Panel was ordered.  The Common Hereditary Cancer Panel offered by [facility] includes sequencing and/or deletion duplication testing of the following 47 genes: APC, ATM, AXIN2, BARD1, BMPR1A, BRCA1, BRCA2, BRIP1, CDH1, CDKN2A (p14ARF), CDKN2A (p16INK4a), CKD4, CHEK2, CTNNA1, DICER1, EPCAM (Deletion/duplication testing only), GREM1 (promoter region deletion/duplication testing only), KIT, MEN1, MLH1, MSH2, MSH3, MSH6, MUTYH, NBN, NF1, NHTL1, PALB2, PDGFRA, PMS2, POLD1, POLE, PTEN, RAD50, RAD51C, RAD51D, SDHB, SDHC, SDHD, SMAD4, SMARCA4. STK11, TP53, TSC1, TSC2, and VHL.  The following genes were evaluated for sequence changes only: SDHA and HOXB13 c.251G>A variant only.  Results: Negative, no pathogenic variants identified. The date of this test report is 04/15/2017   06/28/2017 Surgery   COMPLEX REVISION OF BACK SCAR and LEFT BREAST RECONSTRUCTION WITH SILICONE IMPLANT EXCHANGE AND ACELLULARDERMIS TO LEFT CHEST by Dr. Marieta Shorten 06/28/17      Discussed the use of AI scribe software for clinical note transcription with the patient, who gave verbal consent to proceed.  History of Present Illness [name] "Storm Ellen" is a 63 year old female with a history of breast cancer who presents with newly diagnosed metastatic breast cancer. She is accompanied by her husband. She was referred by her primary care physician following a CT scan due to significant weight loss.  She has metastatic breast cancer with confirmed recurrence from a biopsy, which was painful due to inadequate numbing. The cancer has spread to her bones, particularly the tailbone and pelvic area, causing significant pain, especially when sitting. Her job requires prolonged sitting, worsening her discomfort.  She was previously treated for breast cancer seven years  ago with surgery, radiation, and anti-estrogen therapy, including anastrozole  and tamoxifen , which she tolerated well. She currently experiences joint pain and stiffness, possibly related to past anastrozole  use.  Over the past two to three months, she has experienced significant weight loss, loss of appetite, and difficulty swallowing, leading to a CT scan and oncologist referral. She has persistent back pain, worsened by weight loss, and takes high doses of Tylenol  and ibuprofen, which are insufficient for her tailbone pain. She has not been prescribed stronger pain medications.  She recently fell at work due to left leg weakness, attributed to pain and weakness on her left side.     All other systems were reviewed with the patient and are negative.  MEDICAL HISTORY:  Past Medical History:  Diagnosis Date   Anxiety    Anxiety disorder    Asthma    h/o asthma as a child   Breast cancer (HCC)    Cancer (HCC) 04/2016   right breast cancer   Cholelithiasis    Chronic kidney disease    obstruction of R kidney, ( not a stone) - currently resolved    Depression    Diverticulosis    DJD (degenerative joint disease)    hands & back    Dyspnea    resolved since she stopped smoking    Epigastric abdominal pain    Esophageal stricture    Family history of adverse reaction to anesthesia    daughter has N&V, takes long time to wake up    Family history of colon cancer    Family history of ovarian  cancer    Family hx of colon cancer    Female pelvic peritoneal adhesions 10/26/2012   Ganglion cyst    GERD (gastroesophageal reflux disease)    Headache    low grade currently , family history of migraines    Hemorrhoid    History of radiation therapy 05/03 - 08/11/2016   1. 4 field Right breast was treated to 50.4 Gy in 25 fractions at 1.8 Gy per fraction. 2. The Right breast was boosted to 10 Gy in 5 fractions at 2 Gy per fraction.   Hypertension    Hypothyroidism    Plantar fasciitis,  bilateral    PONV (postoperative nausea and vomiting)    gets anxious with the mask on her face, also remarks that the scop. patch has helped in the past       SURGICAL HISTORY: Past Surgical History:  Procedure Laterality Date   ABDOMINAL HYSTERECTOMY     BREAST IMPLANT REMOVAL Bilateral 01/24/2019   Procedure: REMOVAL BREAST IMPLANTS;  Surgeon: Alger Infield, MD;  Location: [facility];  Service: Plastics;  Laterality: Bilateral;   BREAST RECONSTRUCTION WITH PLACEMENT OF TISSUE EXPANDER AND FLEX HD (ACELLULAR HYDRATED DERMIS) Bilateral 05/19/2016   Procedure: BREAST RECONSTRUCTION WITH PLACEMENT OF TISSUE EXPANDER AND ALLODERM PLACEMENT;  Surgeon: Alger Infield, MD;  Location: [facility];  Service: Plastics;  Laterality: Bilateral;   BREAST RECONSTRUCTION WITH PLACEMENT OF TISSUE EXPANDER AND FLEX HD (ACELLULAR HYDRATED DERMIS) Left 06/28/2017   Procedure: LEFT BREAST RECONSTRUCTION WITH SILICONE IMPLANT EXCHANGE AND ACELLULARDERMIS TO LEFT CHEST;  Surgeon: Alger Infield, MD;  Location: [facility];  Service: Plastics;  Laterality: Left;   CAPSULECTOMY Bilateral 01/24/2019   Procedure: CAPSULECTOMY;  Surgeon: Alger Infield, MD;  Location: [facility];  Service: Plastics;  Laterality: Bilateral;   CESAREAN SECTION  1988   CHOLECYSTECTOMY     HEMORRHOID SURGERY     LAPAROSCOPIC BILATERAL SALPINGECTOMY N/A 10/26/2012   Procedure: operative laparoscopy with lysis of adhesions;  Surgeon: Chrystal Crape, MD;  Location: [facility];  Service: Gynecology;  Laterality: N/A;   LESION EXCISION WITH COMPLEX REPAIR Right 06/09/2019   Procedure: COMPLEX REPAIR RIGHT CHEST 15CM;  Surgeon: Alger Infield, MD;  Location: [facility];  Service: Plastics;  Laterality: Right;   MASTECTOMY Bilateral    MASTECTOMY WITH RADIOACTIVE SEED GUIDED EXCISION AND AXILLARY SENTINEL LYMPH NODE BIOPSY Bilateral 05/19/2016   Procedure: RIGHT SKIN  SPARING MASTECTOMY WITH RIGHT RADIOACTIVE SEED TARGETED DISSECTION AND RIGHT SENTINEL LYMPH NODE BIOPSY, LEFT PROPHYLACTIC SKIN SPARING MASTECTOMY;  Surgeon: Enid Harry, MD;  Location: [facility];  Service: General;  Laterality: Bilateral;   REMOVAL OF BILATERAL TISSUE EXPANDERS WITH PLACEMENT OF BILATERAL BREAST IMPLANTS Bilateral 01/29/2017   Procedure: REMOVAL OF BILATERAL TISSUE EXPANDERS WITH PLACEMENT OF BILATERAL SILICONE BREAST IMPLANTS, ALLODERM TO LEFT BREAST RECONSTRUCTION;  RIGHT LATISSUMUS FLAP;  Surgeon: Alger Infield, MD;  Location: [facility];  Service: Plastics;  Laterality: Bilateral;  Requesting RNFA   SCAR REVISION Right 06/28/2017   Procedure: COMPLEX REVISION OF BACK SCAR;  Surgeon: Alger Infield, MD;  Location:  [facility];  Service: Plastics;  Laterality: Right;   TONSILLECTOMY     TUBAL LIGATION     urologic surgery for ureteropelvic junction obstruction      I have reviewed the social history and family history with the patient and they are unchanged from previous note.  ALLERGIES:  is allergic to adhesive [tape], promethazine hcl, and sulfa  antibiotics.  MEDICATIONS:  Current Outpatient Medications  Medication Sig Dispense Refill   acetaminophen  (TYLENOL ) 500 MG tablet Take 1,000 mg by mouth every 6 (six) hours as needed.     ALPRAZolam  (XANAX ) 1 MG tablet Take 1 tablet by mouth 3 (three) times daily.     anastrozole  (ARIMIDEX ) 1 MG tablet Take 1 tablet (1 mg total) by mouth daily. 90 tablet 1   celecoxib  (CELEBREX ) 200 MG capsule Take 1 capsule (200 mg total) by mouth daily. (Patient taking differently: Take 200 mg by mouth as needed.) 30 capsule 2   cloNIDine  (CATAPRES ) 0.1 MG tablet TAKE 1 TABLET BY MOUTH EVERY DAY 90 tablet 1   gabapentin  (NEURONTIN ) 100 MG capsule Take 1 capsule every day by oral route at dinner.     levothyroxine  (SYNTHROID , LEVOTHROID) 50 MCG tablet Take 50 mcg by mouth daily before breakfast.      omeprazole  (PRILOSEC) 40 MG capsule Take 1 capsule (40 mg total) by mouth 2 (two) times daily. 180 capsule 2   ondansetron  (ZOFRAN ) 4 MG tablet Take 1 tablet (4 mg total) by mouth every 4 (four) hours as needed for nausea or vomiting. 30 tablet 2   oxyCODONE -acetaminophen  (PERCOCET/ROXICET) 5-325 MG tablet Take 1 tablet by mouth every 6 (six) hours as needed for severe pain (pain score 7-10). 8 tablet 0   venlafaxine  XR (EFFEXOR -XR) 150 MG 24 hr capsule TAKE 1 CAPSULE BY MOUTH EVERY DAY WITH BREAKFAST 90 capsule 1   venlafaxine  XR (EFFEXOR -XR) 75 MG 24 hr capsule TAKE 1 CAPSULE BY MOUTH DAILY WITH BREAKFAST. 90 capsule 2   zolpidem (AMBIEN) 5 MG tablet TAKE 1 TABLET BY MOUTH EVERYDAY AT BEDTIME     No current facility-administered medications for this visit.    PHYSICAL EXAMINATION: ECOG PERFORMANCE STATUS: 1 - Symptomatic but completely ambulatory  Vitals:   06/24/23 1117  BP: 137/81  Pulse: 87  Resp: 16  Temp: 98 F (36.7 C)  SpO2: 98%   Wt Readings from Last 3 Encounters:  06/24/23 128 lb 1.6 oz (58.1 kg)  06/22/23 125 lb (56.7 kg)  06/05/23 128 lb (58.1 kg)     GENERAL:alert, no distress and comfortable SKIN: skin color, texture, turgor are normal, no rashes or significant lesions EYES: normal, Conjunctiva are pink and non-injected, sclera clear NECK: supple, thyroid  normal size, non-tender, without nodularity LYMPH:  no palpable lymphadenopathy in the cervical, axillary  LUNGS: clear to auscultation and percussion with normal breathing effort HEART: regular rate & rhythm and no murmurs and no lower extremity edema ABDOMEN:abdomen soft, non-tender and normal bowel sounds Musculoskeletal:no cyanosis of digits and no clubbing  NEURO: alert & oriented x 3 with fluent speech, no focal motor/sensory deficits  Physical Exam    LABORATORY DATA:  I have reviewed the data as listed    Latest Ref Rng & Units 06/24/2023   10:48 AM 05/07/2023    1:35 PM 05/18/2022    1:45 PM   CBC  WBC 4.0 - 10.5 K/uL 4.9  4.6  5.7   Hemoglobin 12.0 - 15.0 g/dL 16.1  09.6  04.5   Hematocrit 36.0 - 46.0 % 47.5  45.9  41.8   Platelets 150 - 400 K/uL 266  281  267         Latest Ref Rng & Units 06/24/2023   10:48 AM 05/07/2023    1:35 PM 05/18/2022    1:45 PM  CMP  Glucose 70 - 99 mg/dL 93  409  811   BUN 8 -  23 mg/dL 8  7  9    Creatinine 0.44 - 1.00 mg/dL 1.61  0.96  0.45   Sodium 135 - 145 mmol/L 139  140  140   Potassium 3.5 - 5.1 mmol/L 3.8  3.3  4.1   Chloride 98 - 111 mmol/L 100  100  106   CO2 22 - 32 mmol/L 32  31  25   Calcium 8.9 - 10.3 mg/dL 9.2  9.0  9.6   Total Protein 6.5 - 8.1 g/dL 6.7  6.9  6.9   Total Bilirubin 0.0 - 1.2 mg/dL 0.5  0.4  0.4   Alkaline Phos 38 - 126 U/L 81  90  73   AST 15 - 41 U/L 20  20  29    ALT 0 - 44 U/L 16  16  28        RADIOGRAPHIC STUDIES: I have personally reviewed the radiological images as listed and agreed with the findings in the report. No results found.    Orders Placed This Encounter  Procedures   Ambulatory referral to Radiation Oncology    Referral Priority:   Urgent    Referral Type:   Consultation    Referral Reason:   Specialty Services Required    Requested Specialty:   Radiation Oncology    Number of Visits Requested:   1   EKG 12-Lead    Ordered by an unspecified provider    EKG 12-Lead    Ordered by an unspecified provider    EKG 12-Lead    Ordered by an unspecified provider    All questions were answered. The patient knows to call the clinic with any problems, questions or concerns. No barriers to learning was detected. The total time spent in the appointment was 60 minutes.     Sonja Granite, MD 06/25/2023

## 2023-06-25 NOTE — Telephone Encounter (Addendum)
 Oral Oncology Patient Advocate Encounter  New authorization   Received notification that prior authorization for Webster Hal is required.   PA submitted on 06/25/23  Key [id number]  Status is pending     Hansel Ley, CPhT Pharmacy Technician Coordinator [facility] [phone] (Ph) 06/25/2023 11:10 AM

## 2023-06-26 ENCOUNTER — Telehealth (HOSPITAL_COMMUNITY): Payer: Self-pay | Admitting: Student

## 2023-06-26 ENCOUNTER — Emergency Department (HOSPITAL_COMMUNITY)

## 2023-06-26 ENCOUNTER — Emergency Department (HOSPITAL_COMMUNITY)
Admission: EM | Admit: 2023-06-26 | Discharge: 2023-06-26 | Disposition: A | Discharge status: Home / Self Care | Attending: Emergency Medicine | Admitting: Emergency Medicine

## 2023-06-26 ENCOUNTER — Encounter (HOSPITAL_COMMUNITY): Payer: Self-pay

## 2023-06-26 DIAGNOSIS — R079 Chest pain, unspecified: Secondary | ICD-10-CM | POA: Diagnosis not present

## 2023-06-26 DIAGNOSIS — E039 Hypothyroidism, unspecified: Secondary | ICD-10-CM | POA: Diagnosis not present

## 2023-06-26 DIAGNOSIS — Z853 Personal history of malignant neoplasm of breast: Secondary | ICD-10-CM | POA: Diagnosis not present

## 2023-06-26 DIAGNOSIS — R202 Paresthesia of skin: Secondary | ICD-10-CM | POA: Diagnosis not present

## 2023-06-26 DIAGNOSIS — J45909 Unspecified asthma, uncomplicated: Secondary | ICD-10-CM | POA: Diagnosis not present

## 2023-06-26 DIAGNOSIS — I10 Essential (primary) hypertension: Secondary | ICD-10-CM | POA: Insufficient documentation

## 2023-06-26 LAB — CBC WITH DIFFERENTIAL/PLATELET
Abs Immature Granulocytes: 0.01 10*3/uL (ref 0.00–0.07)
Basophils Absolute: 0.1 10*3/uL (ref 0.0–0.1)
Basophils Relative: 1 %
Eosinophils Absolute: 0.1 10*3/uL (ref 0.0–0.5)
Eosinophils Relative: 2 %
HCT: 49.5 % — ABNORMAL HIGH (ref 36.0–46.0)
Hemoglobin: 15.3 g/dL — ABNORMAL HIGH (ref 12.0–15.0)
Immature Granulocytes: 0 %
Lymphocytes Relative: 42 %
Lymphs Abs: 2.6 10*3/uL (ref 0.7–4.0)
MCH: 26.6 pg (ref 26.0–34.0)
MCHC: 30.9 g/dL (ref 30.0–36.0)
MCV: 85.9 fL (ref 80.0–100.0)
Monocytes Absolute: 0.6 10*3/uL (ref 0.1–1.0)
Monocytes Relative: 10 %
Neutro Abs: 2.8 10*3/uL (ref 1.7–7.7)
Neutrophils Relative %: 45 %
Platelets: 298 10*3/uL (ref 150–400)
RBC: 5.76 MIL/uL — ABNORMAL HIGH (ref 3.87–5.11)
RDW: 18.9 % — ABNORMAL HIGH (ref 11.5–15.5)
WBC: 6.2 10*3/uL (ref 4.0–10.5)
nRBC: 0 % (ref 0.0–0.2)

## 2023-06-26 LAB — COMPREHENSIVE METABOLIC PANEL WITH GFR
ALT: 18 U/L (ref 0–44)
AST: 22 U/L (ref 15–41)
Albumin: 3.7 g/dL (ref 3.5–5.0)
Alkaline Phosphatase: 70 U/L (ref 38–126)
Anion gap: 11 (ref 5–15)
BUN: 10 mg/dL (ref 8–23)
CO2: 27 mmol/L (ref 22–32)
Calcium: 9.1 mg/dL (ref 8.9–10.3)
Chloride: 101 mmol/L (ref 98–111)
Creatinine, Ser: 0.63 mg/dL (ref 0.44–1.00)
GFR, Estimated: >60 mL/min (ref >60)
Glucose, Bld: 96 mg/dL (ref 70–99)
Potassium: 3.7 mmol/L (ref 3.5–5.1)
Sodium: 139 mmol/L (ref 135–145)
Total Bilirubin: 0.6 mg/dL (ref 0.0–1.2)
Total Protein: 6.3 g/dL — ABNORMAL LOW (ref 6.5–8.1)

## 2023-06-26 LAB — TROPONIN I (HIGH SENSITIVITY): Troponin I (High Sensitivity): <2 ng/L (ref <18)

## 2023-06-26 NOTE — ED Provider Notes (Signed)
 [place] EMERGENCY DEPARTMENT AT [facility] Provider Note   CSN: [id number] Arrival date & time: 06/26/23  1355     History  Chief Complaint  Patient presents with   Arm Pain   Tingling    Right arm pain and tingling after biopsy.     [name] is a 63 y.o. female with history of metastatic breast cancer on chemo and radiation is here for right upper extremity numbness and tingling that started today.  Patient underwent a supraclavicular biopsy yesterday.  During the procedure patient states she felt a sudden jolt of numbness and tingling down her right upper extremity that shortly resolved.  Today though symptoms returned.  She is not having any chest pain, shortness of breath, dizziness.  She has no cardiac history or prior blood clots.   Arm Pain   Past Medical History:  Diagnosis Date   Anxiety    Anxiety disorder    Asthma    h/o asthma as a child   Breast cancer (HCC)    Cancer (HCC) 04/2016   right breast cancer   Cholelithiasis    Chronic kidney disease    obstruction of R kidney, ( not a stone) - currently resolved    Depression    Diverticulosis    DJD (degenerative joint disease)    hands & back    Dyspnea    resolved since she stopped smoking    Epigastric abdominal pain    Esophageal stricture    Family history of adverse reaction to anesthesia    daughter has N&V, takes long time to wake up    Family history of colon cancer    Family history of ovarian cancer    Family hx of colon cancer    Female pelvic peritoneal adhesions 10/26/2012   Ganglion cyst    GERD (gastroesophageal reflux disease)    Headache    low grade currently , family history of migraines    Hemorrhoid    History of radiation therapy 05/03 - 08/11/2016   1. 4 field Right breast was treated to 50.4 Gy in 25 fractions at 1.8 Gy per fraction. 2. The Right breast was boosted to 10 Gy in 5 fractions at 2 Gy per fraction.   Hypertension    Hypothyroidism     Plantar fasciitis, bilateral    PONV (postoperative nausea and vomiting)    gets anxious with the mask on her face, also remarks that the scop. patch has helped in the past          Home Medications Prior to Admission medications   Medication Sig Start Date End Date Taking? Authorizing Provider  ALPRAZolam  (XANAX ) 1 MG tablet Take 1 tablet by mouth 3 (three) times daily.    [provider]  anastrozole  (ARIMIDEX ) 1 MG tablet Take 1 tablet (1 mg total) by mouth daily. 06/09/23   Sonja Trimble, MD  celecoxib  (CELEBREX ) 200 MG capsule Take 1 capsule (200 mg total) by mouth daily. Patient taking differently: Take 200 mg by mouth as needed. 10/31/13   Derl Flemings, PA-C  cloNIDine  (CATAPRES ) 0.1 MG tablet TAKE 1 TABLET BY MOUTH EVERY DAY 03/10/23   Boscia, Heather E, NP  gabapentin  (NEURONTIN ) 100 MG capsule Take 1 capsule every day by oral route at dinner. 09/28/22   [provider]  levothyroxine  (SYNTHROID , LEVOTHROID) 50 MCG tablet Take 50 mcg by mouth daily before breakfast.    [provider]  omeprazole  (PRILOSEC) 40 MG capsule  Take 1 capsule (40 mg total) by mouth 2 (two) times daily. 02/18/23   Graciella Lavender, PA  ondansetron  (ZOFRAN ) 4 MG tablet Take 1 tablet (4 mg total) by mouth every 4 (four) hours as needed for nausea or vomiting. 05/07/23   Boscia, Heather E, NP  oxyCODONE -acetaminophen  (PERCOCET/ROXICET) 5-325 MG tablet Take 1 tablet by mouth every 6 (six) hours as needed for severe pain (pain score 7-10). 06/05/23   Zelaya, Oscar A, PA-C  ribociclib succ (KISQALI, 600 MG DOSE,) 200 MG Therapy Pack Take 3 tablets (600 mg total) by mouth daily. Take for 21 days on, 7 days off, repeat every 28 days. Take as instructed per MD. 06/25/23   Sonja Curtice, MD  venlafaxine  XR (EFFEXOR -XR) 150 MG 24 hr capsule TAKE 1 CAPSULE BY MOUTH EVERY DAY WITH BREAKFAST 03/14/21   Sonja Wakefield-Peacedale, MD  venlafaxine  XR (EFFEXOR -XR) 75 MG 24 hr capsule TAKE 1 CAPSULE BY MOUTH DAILY WITH  BREAKFAST. 05/07/21   Sonja , MD  zolpidem (AMBIEN) 5 MG tablet TAKE 1 TABLET BY MOUTH EVERYDAY AT BEDTIME 07/29/22   [provider]      Allergies    Adhesive [tape], Promethazine hcl, and Sulfa  antibiotics    Review of Systems   Review of Systems  Gastrointestinal:  Positive for nausea.  Neurological:  Positive for numbness.    Physical Exam Updated Vital Signs BP (!) 143/89   Pulse 84   Temp 98.4 F (36.9 C)   Resp 17   Ht 5\' 5"  (1.651 m)   Wt 58.1 kg   SpO2 98%   BMI 21.30 kg/m  Physical Exam Vitals and nursing note reviewed.  Constitutional:      General: She is not in acute distress.    Appearance: She is well-developed.  HENT:     Head: Normocephalic and atraumatic.  Eyes:     Conjunctiva/sclera: Conjunctivae normal.  Cardiovascular:     Rate and Rhythm: Normal rate and regular rhythm.     Heart sounds: No murmur heard. Pulmonary:     Effort: Pulmonary effort is normal. No respiratory distress.     Breath sounds: Normal breath sounds.  Musculoskeletal:        General: No swelling.     Cervical back: Neck supple.  Skin:    General: Skin is warm and dry.     Capillary Refill: Capillary refill takes less than 2 seconds.  Neurological:     Mental Status: She is alert.     Comments: Endorses generalized sensation deficits to the right upper extremity, 5 out of 5 strength bilaterally, motor function intact, tolerates full range of motion of shoulder  Psychiatric:        Mood and Affect: Mood normal.     ED Results / Procedures / Treatments   Labs (all labs ordered are listed, but only abnormal results are displayed) Labs Reviewed  CBC WITH DIFFERENTIAL/PLATELET - Abnormal; Notable for the following components:      Result Value   RBC 5.76 (*)    Hemoglobin 15.3 (*)    HCT 49.5 (*)    RDW 18.9 (*)    All other components within normal limits  COMPREHENSIVE METABOLIC PANEL WITH GFR - Abnormal; Notable for the following components:   Total  Protein 6.3 (*)    All other components within normal limits  TROPONIN I (HIGH SENSITIVITY)  TROPONIN I (HIGH SENSITIVITY)    EKG None  Radiology DG Chest Portable 1 View Result Date: 06/26/2023 CLINICAL  DATA:  Chest pain. EXAM: PORTABLE CHEST 1 VIEW COMPARISON:  Chest radiograph dated 06/24/2006. FINDINGS: No focal consolidation, pleural effusion or pneumothorax. Multiple surgical clips over the right chest. The cardiac silhouette is within limits. No acute osseous pathology. IMPRESSION: No active disease. Electronically Signed   By: Angus Bark M.D.   On: 06/26/2023 16:26    Procedures Procedures    Medications Ordered in ED Medications - No data to display  ED Course/ Medical Decision Making/ A&P                                 Medical Decision Making Amount and/or Complexity of Data Reviewed Labs: ordered. Radiology: ordered.   This patient presents to the ED with chief complaint(s) of numbness.  The complaint involves an extensive differential diagnosis and also carries with it a high risk of complications and morbidity.   Pertinent past medical history as listed in HPI  The differential diagnosis includes  ACS, postop complication, carpal or cubital tunnel, CVA, TIA Additional history obtained: Records reviewed Care Everywhere/External Records  Assessment and management:   Hemodynamically stable, afebrile patient presenting with complaints of right upper extremity numbness that started earlier today.  Patient underwent a right supraclavicular lymph node biopsy.  During that procedure she felt a jolt of numbness and tingling down her right arm.  The symptoms returned today.  She endorses generalized sensation deficit in the right upper extremity, otherwise she has no neurodeficits on exam.  Do not suspect CVA or TIA.  She has no aphasia or facial droop, she is alert and oriented.  She has no chest pain or shortness of breath to suggest cardiac etiology.  She declines  any CT today because she had one a day or so ago.  Reassuringly her troponin is without any elevation remainder of her lab work is without any significant findings.  Discussed IR considerations for CT scan today with patient.  This versus discharge home with outpatient neurology follow-up should symptoms persist.  Patient would prefer the latter.  Using shared decision making we will discharge.  He has gabapentin  at home.  Will take this should symptoms persist.  Independent ECG interpretation:  none  Independent labs interpretation:  The following labs were independently interpreted:  Opponent without elevation, CBC and CMP without significant abnormality  Independent visualization and interpretation of imaging: I independently visualized the following imaging with scope of interpretation limited to determining acute life threatening conditions related to emergency care: None   Consultations obtained:   Dr. Jinx Mourning IR could consider CT soft tissue neck with contrast, otherwise no acute intervention  Disposition:   Patient will be discharged home. The patient has been appropriately medically screened and/or stabilized in the ED. I have low suspicion for any other emergent medical condition which would require further screening, evaluation or treatment in the ED or require inpatient management. At time of discharge the patient is hemodynamically stable and in no acute distress. I have discussed work-up results and diagnosis with patient and answered all questions. Patient is agreeable with discharge plan. We discussed strict return precautions for returning to the emergency department and they verbalized understanding.    Social Determinants of Health:   none  This note was dictated with voice recognition software.  Despite best efforts at proofreading, errors may have occurred which can change the documentation meaning.          Final Clinical Impression(s) /  ED Diagnoses Final  diagnoses:  Paresthesia    Rx / DC Orders ED Discharge Orders          Ordered    Ambulatory referral to Neurology       Comments: An appointment is requested in approximately: 2 weeks   06/26/23 1839              Stanton Earthly 06/26/23 1859    Tegeler, Marine Sia, MD 06/26/23 2004

## 2023-06-26 NOTE — Discharge Instructions (Addendum)
 You were evaluated the emergency room for arm numbness.   Your lab work and imaging did not show any significant abnormality.  You are provided a referral for neurology. If your symptoms persist you may take your gabapentin  and follow-up with the neurologist.

## 2023-06-26 NOTE — ED Notes (Signed)
 Pt stated she has all ready had a EKG performed on Thursday, informed the nurse and the nurse is consulting the doctor

## 2023-06-26 NOTE — Telephone Encounter (Signed)
 Patient s/p right supraclavicular lymph node biopsy in IR 06/22/23. Patient called IR today stating she started having intermittent, sharp, painful, electric shock-like sensations running down her arm from her neck to her hand. She states the biopsy site is tender but otherwise unremarkable.   Case reviewed with Dr. Jinx Mourning - IR recommends patient go to the ED or Urgent Care for evaluation/assessment to rule out unexpected procedural complication.   Ziyad Dyar, AGACNP-BC 06/26/2023, 1:01 PM

## 2023-06-26 NOTE — ED Triage Notes (Signed)
 Pt. Comes from home for right arm pain and tingling after a biopsy procedure on Tuesday. Cancer in the right breast and lymph nodes. Right breast mastectomy done.  Currently receiving chemotherapy.

## 2023-06-28 ENCOUNTER — Telehealth: Payer: Self-pay | Admitting: Radiation Oncology

## 2023-06-28 NOTE — Telephone Encounter (Signed)
 Called pt at the request of PA Deann Exon to offer earlier consult with SIM due to metastatic dx. Pt agreed to moving the consult up but is not 100% ois unsure about tx planning same day. I told her we can keep that hold on the schedule just in case (she was fine with that) but if she had any hesitation, we could always postpone. Pt said she's got a lot of variables happening and she isn't in the best headspace to decide right now. She's waiting on dental clearance for chemo and didn't know if XRT would affect that; I advised her to please ask if she has any concerns when she comes for consult. She verbalized understanding and all immediate questions answered.

## 2023-06-29 ENCOUNTER — Telehealth: Payer: Self-pay

## 2023-06-29 NOTE — Telephone Encounter (Signed)
 F/u with the pt regarding her FMLA forms. She stated so far she thinks she has everything. She stated that She did talk to Jeri,RN on 06/18/2023. When her forms were completed and faxed.Ms. Hoffmeyer stated that some one from her HR department suppose to reach out to her today to see if there is any more information require. She states if there is she will call us  here at the office.

## 2023-06-30 ENCOUNTER — Encounter: Payer: Self-pay | Admitting: Neurology

## 2023-07-01 ENCOUNTER — Ambulatory Visit: Admitting: Radiation Oncology

## 2023-07-01 ENCOUNTER — Institutional Professional Consult (permissible substitution): Admitting: Radiation Oncology

## 2023-07-01 ENCOUNTER — Ambulatory Visit

## 2023-07-01 ENCOUNTER — Telehealth: Payer: Self-pay | Admitting: Radiation Oncology

## 2023-07-01 ENCOUNTER — Other Ambulatory Visit: Payer: Self-pay

## 2023-07-01 ENCOUNTER — Encounter: Payer: Self-pay | Admitting: Hematology

## 2023-07-01 NOTE — Telephone Encounter (Signed)
 Oral Chemotherapy Pharmacist Encounter   Called to check on status of Kisqali through [facility]. Was notified by representative patient has set up shipment and medication is scheduled to arrive to patient's home on 07/07/23.  Will follow up with patient next week for formal education for new medication.   Jude Norton, PharmD, BCPS, BCOP Hematology/Oncology Clinical Pharmacist [facility] and [facility] Oral Chemotherapy Navigation Clinics [phone] 07/01/2023 3:33 PM

## 2023-07-01 NOTE — Telephone Encounter (Signed)
 Call received from pt advising she has changed her mind and at this time does not want radiation. She is adamant about this and is not interested in consultation. Appts cx at pt's request and message routed to RadOnc and MedOnc providers. Pt wanted to make sure Dr. Maryalice Smaller did not need to see her sooner since she has changed her mind about XRT.

## 2023-07-05 NOTE — Telephone Encounter (Signed)
 Oral Chemotherapy Pharmacist Encounter  I spoke with patient for overview of: Kisqali (ribociclib ) for the treatment of metastatic ER/PR positive, HER-2 negative breast cancer in conjunction with anastrozole , planned duration until disease progression or unacceptable drug toxicity. Planned start date for Kisqali per MD is after radiation.   Counseled patient on administration, dosing, side effects, monitoring, drug-food interactions, safe handling, storage, and disposal.  For the first cycle of Kisqali, patient will take Kisqali 200 mg tablets, 2 tablets (400 mg total) by mouth daily for first week, and then if tolerated, increase to 3 tablets (600 mg total) by mouth daily for weeks 2 and 3. Patient knows she will be off for 4th week.   Discussed that after the first cycle of Kisqali, she will then proceed with taking Kisqali 200mg  tablets, 3 tablets (600mg ) by mouth once daily without regard to food. Patient will take Kisqali for 3 weeks on, 1 week off, repeat every 4 weeks.  Medication calendar made for patient to assist with Kisqali titration during first cycle.   Patient knows to avoid grapefruit, grapefruit juice and pomegranate.  Kisqali start date: 07/08/23  Adverse effects include but are not limited to: nausea, vomiting, diarrhea, fatigue, rash, decreased blood counts, and altered cardiac conduction.  Patient will need EKG on 07/21/23 (~C1D14) and then repeat EKG on 08/05/23 (~C2D1) per manufacturer recomendations. Electrolytes will also be closely monitored at treatment initiation.  Reviewed with patient importance of keeping a medication schedule and plan for any missed doses. No barriers to medication adherence identified.  Medication reconciliation performed and medication/allergy list updated.  Discussed with patient that Webster Hal can increase the serum concentrations of her Xanax , thus may increase side effects of her Xanax . We discussed it is OK if she needs to half her Xanax  dose  if it makes her too fatigued, and to update her PCP if dose needs to be adjusted.   All questions answered.  Ms. Langfeldt voiced understanding and appreciation.   Medication education handout and medication calendar placed in mail for patient. Patient knows to call the office with questions or concerns. Oral Chemotherapy Clinic phone number provided to patient.   Jude Norton, PharmD, BCPS, BCOP Hematology/Oncology Clinical Pharmacist [facility] and [facility] Oral Chemotherapy Navigation Clinics [phone] 07/05/2023 10:21 AM

## 2023-07-13 ENCOUNTER — Other Ambulatory Visit: Payer: Self-pay | Admitting: Hematology

## 2023-07-13 DIAGNOSIS — C50811 Malignant neoplasm of overlapping sites of right female breast: Secondary | ICD-10-CM

## 2023-07-15 ENCOUNTER — Inpatient Hospital Stay

## 2023-07-15 ENCOUNTER — Inpatient Hospital Stay: Admitting: Nurse Practitioner

## 2023-07-15 ENCOUNTER — Other Ambulatory Visit: Payer: Self-pay | Admitting: Nurse Practitioner

## 2023-07-15 VITALS — BP 136/86 | HR 81 | Temp 97.8°F | Resp 17 | Wt 124.8 lb

## 2023-07-15 DIAGNOSIS — Z170 Estrogen receptor positive status [ER+]: Secondary | ICD-10-CM

## 2023-07-15 DIAGNOSIS — C50811 Malignant neoplasm of overlapping sites of right female breast: Secondary | ICD-10-CM | POA: Diagnosis not present

## 2023-07-15 DIAGNOSIS — C7951 Secondary malignant neoplasm of bone: Secondary | ICD-10-CM | POA: Diagnosis not present

## 2023-07-15 DIAGNOSIS — Z1732 Human epidermal growth factor receptor 2 negative status: Secondary | ICD-10-CM | POA: Diagnosis not present

## 2023-07-15 DIAGNOSIS — Z9013 Acquired absence of bilateral breasts and nipples: Secondary | ICD-10-CM | POA: Diagnosis not present

## 2023-07-15 DIAGNOSIS — Z87891 Personal history of nicotine dependence: Secondary | ICD-10-CM | POA: Diagnosis not present

## 2023-07-15 DIAGNOSIS — Z1721 Progesterone receptor positive status: Secondary | ICD-10-CM | POA: Diagnosis not present

## 2023-07-15 DIAGNOSIS — G893 Neoplasm related pain (acute) (chronic): Secondary | ICD-10-CM | POA: Diagnosis not present

## 2023-07-15 DIAGNOSIS — Z79811 Long term (current) use of aromatase inhibitors: Secondary | ICD-10-CM | POA: Diagnosis not present

## 2023-07-15 DIAGNOSIS — C773 Secondary and unspecified malignant neoplasm of axilla and upper limb lymph nodes: Secondary | ICD-10-CM | POA: Diagnosis not present

## 2023-07-15 LAB — CBC WITH DIFFERENTIAL (CANCER CENTER ONLY)
Abs Immature Granulocytes: 0.01 10*3/uL (ref 0.00–0.07)
Basophils Absolute: 0.1 10*3/uL (ref 0.0–0.1)
Basophils Relative: 2 %
Eosinophils Absolute: 0.1 10*3/uL (ref 0.0–0.5)
Eosinophils Relative: 3 %
HCT: 47 % — ABNORMAL HIGH (ref 36.0–46.0)
Hemoglobin: 15.2 g/dL — ABNORMAL HIGH (ref 12.0–15.0)
Immature Granulocytes: 0 %
Lymphocytes Relative: 37 %
Lymphs Abs: 1.6 10*3/uL (ref 0.7–4.0)
MCH: 27 pg (ref 26.0–34.0)
MCHC: 32.3 g/dL (ref 30.0–36.0)
MCV: 83.5 fL (ref 80.0–100.0)
Monocytes Absolute: 0.2 10*3/uL (ref 0.1–1.0)
Monocytes Relative: 5 %
Neutro Abs: 2.2 10*3/uL (ref 1.7–7.7)
Neutrophils Relative %: 53 %
Platelet Count: 262 10*3/uL (ref 150–400)
RBC: 5.63 MIL/uL — ABNORMAL HIGH (ref 3.87–5.11)
RDW: 19.7 % — ABNORMAL HIGH (ref 11.5–15.5)
WBC Count: 4.3 10*3/uL (ref 4.0–10.5)
nRBC: 0 % (ref 0.0–0.2)

## 2023-07-15 LAB — CMP (CANCER CENTER ONLY)
ALT: 13 U/L (ref 0–44)
AST: 18 U/L (ref 15–41)
Albumin: 4.6 g/dL (ref 3.5–5.0)
Alkaline Phosphatase: 72 U/L (ref 38–126)
Anion gap: 8 (ref 5–15)
BUN: 11 mg/dL (ref 8–23)
CO2: 32 mmol/L (ref 22–32)
Calcium: 9.4 mg/dL (ref 8.9–10.3)
Chloride: 100 mmol/L (ref 98–111)
Creatinine: 1.04 mg/dL — ABNORMAL HIGH (ref 0.44–1.00)
GFR, Estimated: >60 mL/min (ref >60)
Glucose, Bld: 98 mg/dL (ref 70–99)
Potassium: 3.7 mmol/L (ref 3.5–5.1)
Sodium: 140 mmol/L (ref 135–145)
Total Bilirubin: 0.9 mg/dL (ref 0.0–1.2)
Total Protein: 7.2 g/dL (ref 6.5–8.1)

## 2023-07-15 MED ORDER — ONDANSETRON HCL 8 MG PO TABS: 8.0000 mg | ORAL_TABLET | ORAL | 1 refills | Status: DC | PRN

## 2023-07-15 MED ORDER — OXYCODONE-ACETAMINOPHEN 5-325 MG PO TABS: 1.0000 | ORAL_TABLET | Freq: Four times a day (QID) | ORAL | 0 refills | Status: DC | PRN

## 2023-07-15 NOTE — Assessment & Plan Note (Addendum)
 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 S/p bilateral mastectomy with reconstruction and adjuvant radiation. (Implants ultimately removed 01/24/19 by Dr. Marieta Shorten.) -She started antiestrogen therapy Exemestane  on 04/01/16, planned for 7 years. She experienced joint pain and hot flashes on exemestane , anastrozole , and tamoxifen . She opted to continue anastrozole . Given her continued difficulty with joint pain, as well as her low risk mammaprint, she stopped after she completed 5 years therapy in 03/2021 -she developed low appetite and weight loss in Feb 2025, CT and bone scan showed bone lesions. PET on 06/01/2023 showed diffuse hypermetabolic bone lesions and hypermetabolic small lymph node in the right axillary region and supraclavicular region on the right low neck, concerning for metastatic disease. -[place] node biopsy on 06/22/2023 confirmed metastatic breast cancer, will request NGS  -she started anastrozole  in 05/2023, will add Ribciclib and zometa   - Started scaly 2 weeks ago.  Has symptomatic recurrence..  Will initiate this in approximately 4 weeks.

## 2023-07-15 NOTE — Progress Notes (Signed)
 Patient Care Team: Jhon Moselle, FNP as PCP - General (Internal Medicine) [place], Robbie Chiles, MD (Inactive) (Orthopedic Surgery) Albert Huff, MD (Ophthalmology) Devon Fogo, MD (Inactive) (Dermatology) Sonja Holiday, MD as Consulting Physician (Hematology) Enid Harry, MD as Consulting Physician (General Surgery) Johna Myers, MD as Consulting Physician (Radiation Oncology) Percival Brace, NP as Nurse Practitioner (Hematology and Oncology)  Clinic Day:  07/25/2023  Referring physician: Jhon Moselle, FNP  ASSESSMENT & PLAN:   Assessment & Plan: Malignant neoplasm of overlapping sites of right breast in female, estrogen receptor positive (HCC)  mpT2 pN1a, stage IA, G1,  ER positive, PR positive, HER-2 negative, mammaprint low risk luminal type A -Diagnosed in 02/2016. S/p bilateral mastectomy with reconstruction and adjuvant radiation. (Implants ultimately removed 01/24/19 by Dr. Marieta Shorten.) -She started antiestrogen therapy Exemestane  on 04/01/16, planned for 7 years. She experienced joint pain and hot flashes on exemestane , anastrozole , and tamoxifen . She opted to continue anastrozole . Given her continued difficulty with joint pain, as well as her low risk mammaprint, she stopped after she completed 5 years therapy in 03/2021 -she developed low appetite and weight loss in Feb 2025, CT and bone scan showed bone lesions. PET on 06/01/2023 showed diffuse hypermetabolic bone lesions and hypermetabolic small lymph node in the right axillary region and supraclavicular region on the right low neck, concerning for metastatic disease. -[place] node biopsy on 06/22/2023 confirmed metastatic breast cancer, will request NGS  -she started anastrozole  in 05/2023, will add Ribciclib and zometa   - Started scaly 2 weeks ago.  Has symptomatic recurrence..  Will initiate this in approximately 4 weeks.   Chronic joint pain Likely related to metastatic breast cancer.  Currently taking  prescription for oxycodone  as needed.  Will do referral to [facility], NP, palliative care for management of chronic pain related to metastatic breast cancer.  Osteopenia Patient has brought dental clearance which enables her to start Zometa .  Will recommend initiation of this in 4 weeks.  Will add to treatment plan.  Plan Labs reviewed. -CBC and CMP are unremarkable. - CA125 normal 25.3. Tolerating treatment with anastrozole  and Kisqali well.  Continuing this prescribed. Plan to follow-up in 4 weeks with initiation of treatment with Zometa .  The patient understands the plans discussed today and is in agreement with them.  She knows to contact our office if she develops concerns prior to her next appointment.  I provided 25 minutes of face-to-face time during this encounter and > 50% was spent counseling as documented under my assessment and plan.    Sharyon Deis, NP  [facility] - A DEPT OF [facility] [address] [place] [address] Dept: [phone] Dept Fax: [phone]   Orders Placed This Encounter  Procedures   Amb Referral to Palliative Care    Referral Priority:   Routine    Referral Type:   Consultation    Number of Visits Requested:   1      CHIEF COMPLAINT:  CC: Right breast cancer, estrogen receptor positive  Current Treatment: Anastrozole  and Kisqali  INTERVAL HISTORY:  Yulia is here today for repeat clinical assessment.  She last had phone visit with Dr. Maryalice Smaller on 06/24/2023. She did have biopsy of supraclavicular lymph node.  This confirmed metastatic carcinoma consistent with patient's known breast cancer primary.  Patient started Kisqali 2 weeks ago.  This was added to anastrozole .  Tolerating well overall.  Does have chronic low back pain.  Unknown as needed for pain.  Renew current prescription and refer to [facility], NP palliative care. She denies chest pain, chest pressure, or shortness of  breath. She denies headaches or visual disturbances. She denies abdominal pain, nausea, vomiting, or changes in bowel or bladder habits.  She denies fevers or chills. She denies pain. Her appetite is good. Her weight has been stable.  I have reviewed the past medical history, past surgical history, social history and family history with the patient and they are unchanged from previous note.  ALLERGIES:  is allergic to adhesive [tape], promethazine hcl, and sulfa  antibiotics.  MEDICATIONS:  Current Outpatient Medications  Medication Sig Dispense Refill   ALPRAZolam  (XANAX ) 1 MG tablet Take 1 tablet by mouth 3 (three) times daily.     anastrozole  (ARIMIDEX ) 1 MG tablet Take 1 tablet (1 mg total) by mouth daily. 90 tablet 1   celecoxib  (CELEBREX ) 200 MG capsule Take 1 capsule (200 mg total) by mouth daily. (Patient taking differently: Take 200 mg by mouth as needed.) 30 capsule 2   cloNIDine  (CATAPRES ) 0.1 MG tablet TAKE 1 TABLET BY MOUTH EVERY DAY 90 tablet 1   gabapentin  (NEURONTIN ) 100 MG capsule Take 1 capsule every day by oral route at dinner.     KISQALI, 600 MG DOSE, 200 MG Therapy Pack TAKE 3 TABLETS BY MOUTH 1 TIME A DAY FOR 21 DAYS ON, 7 DAYS OFF, REPEAT EVERY 28 DAYS. TAKE AS INSTRUCTED PER MD. 63 tablet 0   levothyroxine  (SYNTHROID , LEVOTHROID) 50 MCG tablet Take 50 mcg by mouth daily before breakfast.     magic mouthwash (nystatin , lidocaine , diphenhydrAMINE , alum & mag hydroxide) suspension Swish and swallow 5 mLs by mouth 4 (four) times daily as needed for mouth pain. 140 mL 1   omeprazole  (PRILOSEC) 40 MG capsule Take 1 capsule (40 mg total) by mouth 2 (two) times daily. 180 capsule 2   venlafaxine  XR (EFFEXOR -XR) 150 MG 24 hr capsule TAKE 1 CAPSULE BY MOUTH EVERY DAY WITH BREAKFAST 90 capsule 1   venlafaxine  XR (EFFEXOR -XR) 75 MG 24 hr capsule TAKE 1 CAPSULE BY MOUTH DAILY WITH BREAKFAST. 90 capsule 2   zolpidem (AMBIEN) 5 MG tablet TAKE 1 TABLET BY MOUTH EVERYDAY AT BEDTIME      ondansetron  (ZOFRAN ) 8 MG tablet Take 1 tablet (8 mg total) by mouth every 4 (four) hours as needed for nausea or vomiting. 135 tablet 1   oxyCODONE -acetaminophen  (PERCOCET/ROXICET) 5-325 MG tablet Take 1 tablet by mouth every 6 (six) hours as needed for severe pain (pain score 7-10). 30 tablet 0   potassium chloride  (KLOR-CON  M) 10 MEQ tablet Take 1 tablet (10 mEq total) by mouth daily. 30 tablet 1   prochlorperazine  (COMPAZINE ) 10 MG tablet Take 1 tablet (10 mg total) by mouth 2 (two) times daily as needed for nausea or vomiting. 16 tablet 0   valACYclovir  (VALTREX ) 500 MG tablet Take 1 tablet (500 mg total) by mouth 2 (two) times daily. 10 tablet 1   No current facility-administered medications for this visit.    HISTORY OF PRESENT ILLNESS:   Oncology History Overview Note  Cancer Staging Malignant neoplasm of overlapping sites of right breast in female, estrogen receptor positive (HCC) Staging form: Breast, AJCC 8th Edition - Clinical stage from 03/23/2016: Stage IIA (cT3, cN1, cM0, G2, ER: Positive, PR: Positive, HER2: Negative) - Signed by Sonja [place], MD on 03/31/2016 - Pathologic stage from 05/19/2016: Stage IA (pT2(m), pN1a, cM0, G1, ER: Positive, PR: Positive, HER2: Negative) -  Signed by Sonja [place], MD on 06/18/2016     Malignant neoplasm of overlapping sites of right breast in female, estrogen receptor positive (HCC)  03/17/2016 Mammogram   B/l diagnostic mammogram and righ US  showed a 3.6cm irregular mass in the right breast 11:00 position, posterior depth, there is a enlarged lymph node in the right axilla is highly suspicious for malignancy. additional 7 mm oval mass in the right breast lower outer quadrant is suspicious for malignancy.   03/23/2016 Initial Biopsy   Right breast 9:30 position biopsy showed invasive ductal carcinoma, grade 1. 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   03/23/2016 Receptors her2   Both breast and node biopsy tumor ER 100%  positive, PR 70-95% positive, HER-2 negative, Ki-67 40%   03/23/2016 Initial Diagnosis   Malignant neoplasm of upper-outer quadrant of right breast in female, estrogen receptor positive (HCC)   03/25/2016 Initial Biopsy   Right breast 11:00 position core needle biopsy showed invasive duct carcinoma, grade 2.    03/25/2016 Receptors her2   ER 95% positive, PR 90% positive, HER-2 negative, Ki-67 15%   03/25/2016 Miscellaneous   Mammaprint showed low risk type A with index +0.105   03/30/2016 Imaging   Bilateral breast MRI with and without contrast showed a large lobulated enhancing mass within the upper-outer and lower outer right breast with surrounding nodularity, measuring 6.1 x 4.4 x 5.6 cm. Multiple critically sick and right axillary lymph nodes are demonstrated measuring up to 1.5 cm.    04/01/2016 -  Anti-estrogen oral therapy   Exemestane  25 mg daily, plan for 7 years. Switched to Anastrozole  1mg  in 06/2017 due to joint pain and hot flashes. Due to persistent side effects I changed her to Tamoxifen  in 09/2018. She stopped Tamoxifen  and switched back to anastrozole  in 01/2019 because it was more tolerable.    05/19/2016 Surgery   Bilateral mastectomy and right axillary regional lymph node resection.   05/19/2016 Pathology Results   -Right axillary regional lymph node resection revealed metastatic carcinoma in 2/7 lymph nodes. -Left simple mastectomy revealed lobular neoplasia and fibrocystic changes with adenosis and calcifications. 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 -Skin of the right mastectomy flap was benign. -mpT2, pN1a   06/25/2016 - 08/11/2016 Radiation Therapy   Site/dose:    1. 4 field Right breast was treated to 50.4 Gy in 25 fractions at 1.8 Gy per fraction. 2. The  Right breast was boosted to 10 Gy in 5 fractions at 2 Gy per fraction.   04/15/2017 Genetic Testing   The patient had genetic testing due to a personal history of breast cancer and family history of breast, ovarian, and colon cancer. The Common Hereditary Cancer Panel was ordered.  The Common Hereditary Cancer Panel offered by [facility] includes sequencing and/or deletion duplication testing of the following 47 genes: APC, ATM, AXIN2, BARD1, BMPR1A, BRCA1, BRCA2, BRIP1, CDH1, CDKN2A (p14ARF), CDKN2A (p16INK4a), CKD4, CHEK2, CTNNA1, DICER1, EPCAM (Deletion/duplication testing only), GREM1 (promoter region deletion/duplication testing only), KIT, MEN1, MLH1, MSH2, MSH3, MSH6, MUTYH, NBN, NF1, NHTL1, PALB2, PDGFRA, PMS2, POLD1, POLE, PTEN, RAD50, RAD51C, RAD51D, SDHB, SDHC, SDHD, SMAD4, SMARCA4. STK11, TP53, TSC1, TSC2, and VHL.  The following genes were evaluated for sequence changes only: SDHA and HOXB13 c.251G>A variant only.  Results: Negative, no pathogenic variants identified. The date of this test report is 04/15/2017  06/28/2017 Surgery   COMPLEX REVISION OF BACK SCAR and LEFT BREAST RECONSTRUCTION WITH SILICONE IMPLANT EXCHANGE AND ACELLULARDERMIS TO LEFT CHEST by Dr. Marieta Shorten 06/28/17   06/13/2023 Pathology Results   FINAL MICROSCOPIC DIAGNOSIS: A. LYMPH NODE, BIOPSY: -  Metastatic carcinoma morphologically consistent with the patient's known breast primary. ADDENDUM:  An immunohistochemical stain for GATA3 is positive supporting the morphological and clinical impression of breast primary. ADDENDUM: Lymph node, biopsy - Metastatic carcinoma  PROGNOSTIC INDICATORS Results:  IMMUNOHISTOCHEMICAL AND MORPHOMETRIC ANALYSIS PERFORMED MANUALLY  The tumor cells are negative for Her2 (1+). Estrogen Receptor:  70%, positive, strong staining intensity Progesterone Receptor:  80%, positive, strong staining intensity Proliferation Marker Ki67: 20% COMMENT:  The negative hormone receptor study(ies) in  this case has an internal positive control.  REFERENCE RANGE ESTROGEN RECEPTOR        NEGATIVE     0%        POSITIVE       =>1% REFERENCE RANGE PROGESTERONE RECEPTOR        NEGATIVE     0%        POSITIVE        =>1% All controls stained appropriately         REVIEW OF SYSTEMS:   Constitutional: Denies fevers, chills or abnormal weight loss Eyes: Denies blurriness of vision Ears, nose, mouth, throat, and face: Denies mucositis or sore throat Respiratory: Denies cough, dyspnea or wheezes Cardiovascular: Denies palpitation, chest discomfort or lower extremity swelling Gastrointestinal:  Denies nausea, heartburn or change in bowel habits Skin: Denies abnormal skin rashes Lymphatics: Denies new lymphadenopathy or easy bruising Neurological:Denies numbness, tingling or new weaknesses Behavioral/Psych: Mood is stable, no new changes  Musculoskeletal: generalized joint pain and tenderness. This is mostly in lower back and around pelvis.  All other systems were reviewed with the patient and are negative.   VITALS:   Today's Vitals   07/15/23 1025 07/15/23 1033  BP: 136/86   Pulse: 81   Resp: 17   Temp: 97.8 F (36.6 C)   SpO2: 97%   Weight: 124 lb 12.8 oz (56.6 kg)   PainSc:  0-No pain   Body mass index is 20.77 kg/m.    Wt Readings from Last 3 Encounters:  07/22/23 123 lb 1.6 oz (55.8 kg)  07/19/23 124 lb 12.5 oz (56.6 kg)  07/15/23 124 lb 12.8 oz (56.6 kg)    Body mass index is 20.77 kg/m.  Performance status (ECOG): 1 - Symptomatic but completely ambulatory  PHYSICAL EXAM:   GENERAL:alert, no distress and comfortable SKIN: skin color, texture, turgor are normal, no rashes or significant lesions EYES: normal, Conjunctiva are pink and non-injected, sclera clear OROPHARYNX:no exudate, no erythema and lips, buccal mucosa, and tongue normal  NECK: supple, thyroid  normal size, non-tender, without nodularity LYMPH:  no palpable lymphadenopathy in the cervical,  axillary or inguinal LUNGS: clear to auscultation and percussion with normal breathing effort HEART: regular rate & rhythm and no murmurs and no lower extremity edema ABDOMEN:abdomen soft, non-tender and normal bowel sounds Musculoskeletal:no cyanosis of digits and no clubbing.  NEURO: alert & oriented x 3 with fluent speech, no focal motor/sensory deficits  LABORATORY DATA:  I have reviewed the data as listed    Component Value Date/Time   NA 143 07/22/2023 1041   NA 141 02/19/2017 1304   K 3.3 (L) 07/22/2023 1041   K 4.0 02/19/2017 1304   CL 104 07/22/2023 1041   CO2 34 (H) 07/22/2023 1041   CO2  26 02/19/2017 1304   GLUCOSE 88 07/22/2023 1041   GLUCOSE 99 02/19/2017 1304   BUN 7 (L) 07/22/2023 1041   BUN 16.4 02/19/2017 1304   CREATININE 0.84 07/22/2023 1041   CREATININE 0.9 02/19/2017 1304   CALCIUM 8.8 (L) 07/22/2023 1041   CALCIUM 8.8 02/19/2017 1304   PROT 6.1 (L) 07/22/2023 1041   PROT 6.2 (L) 02/19/2017 1304   ALBUMIN  3.9 07/22/2023 1041   ALBUMIN  3.6 02/19/2017 1304   AST 13 (L) 07/22/2023 1041   AST 22 02/19/2017 1304   ALT 35 07/22/2023 1041   ALT 23 02/19/2017 1304   ALKPHOS 98 07/22/2023 1041   ALKPHOS 88 02/19/2017 1304   BILITOT 0.4 07/22/2023 1041   BILITOT 0.39 02/19/2017 1304   GFRNONAA >60 07/22/2023 1041   GFRNONAA 72 11/06/2015 0831   GFRAA >60 10/12/2019 1445   GFRAA 83 11/06/2015 0831     Lab Results  Component Value Date   WBC 2.5 (L) 07/22/2023   NEUTROABS 0.7 (L) 07/22/2023   HGB 13.7 07/22/2023   HCT 41.3 07/22/2023   MCV 82.9 07/22/2023   PLT 170 07/22/2023    RADIOGRAPHIC STUDIES: CT ABDOMEN PELVIS W CONTRAST Result Date: 07/19/2023 CLINICAL DATA:  Abdominal pain, acute, nonlocalized weakness x 2 days. Patient report she had her chemo done yesterday. Nausea. EXAM: CT ABDOMEN AND PELVIS WITH CONTRAST TECHNIQUE: Multidetector CT imaging of the abdomen and pelvis was performed using the standard protocol following bolus administration  of intravenous contrast. RADIATION DOSE REDUCTION: This exam was performed according to the departmental dose-optimization program which includes automated exposure control, adjustment of the mA and/or kV according to patient size and/or use of iterative reconstruction technique. CONTRAST:  OMNIPAQUE  IOHEXOL  300 MG/ML  SOLN COMPARISON:  PET CT 06/01/2023 FINDINGS: Lower chest: No acute abnormality. Hepatobiliary: Hypodensity along the falciform ligament likely focal fatty infiltration. Status post cholecystectomy. No biliary dilatation. Pancreas: Diffusely atrophic. No focal lesion. Otherwise normal pancreatic contour. No surrounding inflammatory changes. No main pancreatic ductal dilatation. Spleen: Normal in size without focal abnormality. Adrenals/Urinary Tract: No adrenal nodule bilaterally. Bilateral kidneys enhance symmetrically.  Extrarenal pelvises. No hydronephrosis. No hydroureter.  No nephroureterolithiasis. The urinary bladder is unremarkable. On delayed imaging, there is no urothelial wall thickening and there are no filling defects in the opacified portions of the bilateral collecting systems or ureters. Stomach/Bowel: Stomach is within normal limits. No evidence of bowel wall thickening or dilatation. Fluid throughout the lumen of the large bowel. Appendix appears normal. Vascular/Lymphatic: No abdominal aorta or iliac aneurysm. No abdominal, pelvic, or inguinal lymphadenopathy. Reproductive: Status post hysterectomy. No adnexal masses. Other: No intraperitoneal free fluid. No intraperitoneal free gas. No organized fluid collection. Musculoskeletal: No abdominal wall hernia or abnormality. Similar-appearing partially visualized sclerosis of the T9 vertebral body. Similar-appearing sclerosis of the S1 vertebral body. No acute displaced fracture. Multilevel degenerative changes of the spine. IMPRESSION: 1. Diarrheal state. 2. Sclerotic osseous metastases. Electronically Signed   By: Morgane   Naveau M.D.   On: 07/19/2023 18:04   US  Abdomen Limited RUQ (LIVER/GB) Result Date: 07/19/2023 CLINICAL DATA:  Abdominal pain. Prior cholecystectomy. Breast cancer. EXAM: ULTRASOUND ABDOMEN LIMITED RIGHT UPPER QUADRANT COMPARISON:  PET-CT 06/01/2023 FINDINGS: Gallbladder: Surgically absent. Common bile duct: Diameter: 0.2 cm Liver: No focal lesion identified. Within normal limits in parenchymal echogenicity. Portal vein is patent on color Doppler imaging with normal direction of blood flow towards the liver. Other: None. IMPRESSION: 1. No hepatic abnormality identified.  No biliary dilatation. 2. Prior cholecystectomy.  Electronically Signed   By: Freida Jes M.D.   On: 07/19/2023 17:32   DG Chest Portable 1 View Result Date: 06/26/2023 CLINICAL DATA:  Chest pain. EXAM: PORTABLE CHEST 1 VIEW COMPARISON:  Chest radiograph dated 06/24/2006. FINDINGS: No focal consolidation, pleural effusion or pneumothorax. Multiple surgical clips over the right chest. The cardiac silhouette is within limits. No acute osseous pathology. IMPRESSION: No active disease. Electronically Signed   By: Angus Bark M.D.   On: 06/26/2023 16:26

## 2023-07-16 LAB — CA 125: Cancer Antigen (CA) 125: 25.3 U/mL (ref 0.0–38.1)

## 2023-07-19 ENCOUNTER — Emergency Department (HOSPITAL_COMMUNITY)

## 2023-07-19 ENCOUNTER — Other Ambulatory Visit: Payer: Self-pay

## 2023-07-19 ENCOUNTER — Emergency Department (HOSPITAL_COMMUNITY)
Admission: EM | Admit: 2023-07-19 | Discharge: 2023-07-19 | Disposition: A | Discharge status: Home / Self Care | Attending: Emergency Medicine | Admitting: Emergency Medicine

## 2023-07-19 ENCOUNTER — Encounter (HOSPITAL_COMMUNITY): Payer: Self-pay | Admitting: Emergency Medicine

## 2023-07-19 DIAGNOSIS — C7951 Secondary malignant neoplasm of bone: Secondary | ICD-10-CM | POA: Diagnosis not present

## 2023-07-19 DIAGNOSIS — Z853 Personal history of malignant neoplasm of breast: Secondary | ICD-10-CM | POA: Diagnosis not present

## 2023-07-19 DIAGNOSIS — R110 Nausea: Secondary | ICD-10-CM | POA: Diagnosis not present

## 2023-07-19 DIAGNOSIS — Z79890 Hormone replacement therapy (postmenopausal): Secondary | ICD-10-CM | POA: Insufficient documentation

## 2023-07-19 DIAGNOSIS — R109 Unspecified abdominal pain: Secondary | ICD-10-CM | POA: Diagnosis not present

## 2023-07-19 DIAGNOSIS — Z79899 Other long term (current) drug therapy: Secondary | ICD-10-CM | POA: Insufficient documentation

## 2023-07-19 DIAGNOSIS — Z9049 Acquired absence of other specified parts of digestive tract: Secondary | ICD-10-CM | POA: Diagnosis not present

## 2023-07-19 DIAGNOSIS — I10 Essential (primary) hypertension: Secondary | ICD-10-CM | POA: Insufficient documentation

## 2023-07-19 DIAGNOSIS — E876 Hypokalemia: Secondary | ICD-10-CM | POA: Diagnosis not present

## 2023-07-19 DIAGNOSIS — E039 Hypothyroidism, unspecified: Secondary | ICD-10-CM | POA: Insufficient documentation

## 2023-07-19 LAB — COMPREHENSIVE METABOLIC PANEL WITH GFR
ALT: 113 U/L — ABNORMAL HIGH (ref 0–44)
AST: 88 U/L — ABNORMAL HIGH (ref 15–41)
Albumin: 3.7 g/dL (ref 3.5–5.0)
Alkaline Phosphatase: 165 U/L — ABNORMAL HIGH (ref 38–126)
Anion gap: 10 (ref 5–15)
BUN: 14 mg/dL (ref 8–23)
CO2: 25 mmol/L (ref 22–32)
Calcium: 8.2 mg/dL — ABNORMAL LOW (ref 8.9–10.3)
Chloride: 99 mmol/L (ref 98–111)
Creatinine, Ser: 0.78 mg/dL (ref 0.44–1.00)
GFR, Estimated: >60 mL/min (ref >60)
Glucose, Bld: 123 mg/dL — ABNORMAL HIGH (ref 70–99)
Potassium: 2.8 mmol/L — ABNORMAL LOW (ref 3.5–5.1)
Sodium: 134 mmol/L — ABNORMAL LOW (ref 135–145)
Total Bilirubin: 1.4 mg/dL — ABNORMAL HIGH (ref 0.0–1.2)
Total Protein: 6.6 g/dL (ref 6.5–8.1)

## 2023-07-19 LAB — CBC
HCT: 47.1 % — ABNORMAL HIGH (ref 36.0–46.0)
Hemoglobin: 15 g/dL (ref 12.0–15.0)
MCH: 27.4 pg (ref 26.0–34.0)
MCHC: 31.8 g/dL (ref 30.0–36.0)
MCV: 86.1 fL (ref 80.0–100.0)
Platelets: 175 10*3/uL (ref 150–400)
RBC: 5.47 MIL/uL — ABNORMAL HIGH (ref 3.87–5.11)
RDW: 19.9 % — ABNORMAL HIGH (ref 11.5–15.5)
WBC: 3.1 10*3/uL — ABNORMAL LOW (ref 4.0–10.5)
nRBC: 0 % (ref 0.0–0.2)

## 2023-07-19 LAB — MAGNESIUM: Magnesium: 1.7 mg/dL (ref 1.7–2.4)

## 2023-07-19 LAB — URINALYSIS, ROUTINE W REFLEX MICROSCOPIC
Bilirubin Urine: NEGATIVE
Glucose, UA: NEGATIVE mg/dL
Hgb urine dipstick: NEGATIVE
Ketones, ur: 20 mg/dL — AB
Nitrite: NEGATIVE
Protein, ur: NEGATIVE mg/dL
Specific Gravity, Urine: 1.014 (ref 1.005–1.030)
pH: 5 (ref 5.0–8.0)

## 2023-07-19 LAB — LIPASE, BLOOD: Lipase: 30 U/L (ref 11–51)

## 2023-07-19 MED ORDER — SODIUM CHLORIDE (PF) 0.9 % IJ SOLN
INTRAMUSCULAR | Status: AC
  Filled 2023-07-19: qty 50

## 2023-07-19 MED ORDER — POTASSIUM CHLORIDE 10 MEQ/100ML IV SOLN
10.0000 meq | INTRAVENOUS | Status: AC
  Administered 2023-07-19 (×2): 10 meq via INTRAVENOUS
  Filled 2023-07-19 (×2): qty 100

## 2023-07-19 MED ORDER — METOCLOPRAMIDE HCL 5 MG/ML IJ SOLN
10.0000 mg | Freq: Once | INTRAMUSCULAR | Status: AC
  Administered 2023-07-19: 10 mg via INTRAVENOUS
  Filled 2023-07-19: qty 2

## 2023-07-19 MED ORDER — PROCHLORPERAZINE EDISYLATE 10 MG/2ML IJ SOLN
10.0000 mg | Freq: Once | INTRAMUSCULAR | Status: AC
  Administered 2023-07-19: 10 mg via INTRAVENOUS
  Filled 2023-07-19: qty 2

## 2023-07-19 MED ORDER — KETOROLAC TROMETHAMINE 15 MG/ML IJ SOLN
15.0000 mg | Freq: Once | INTRAMUSCULAR | Status: AC
  Administered 2023-07-19: 15 mg via INTRAVENOUS
  Filled 2023-07-19: qty 1

## 2023-07-19 MED ORDER — LACTATED RINGERS IV BOLUS
1000.0000 mL | Freq: Once | INTRAVENOUS | Status: AC
  Administered 2023-07-19: 1000 mL via INTRAVENOUS

## 2023-07-19 MED ORDER — DIPHENHYDRAMINE HCL 50 MG/ML IJ SOLN
12.5000 mg | Freq: Once | INTRAMUSCULAR | Status: AC
  Administered 2023-07-19: 12.5 mg via INTRAVENOUS
  Filled 2023-07-19: qty 1

## 2023-07-19 MED ORDER — POTASSIUM CHLORIDE CRYS ER 20 MEQ PO TBCR
40.0000 meq | EXTENDED_RELEASE_TABLET | Freq: Once | ORAL | Status: AC
Start: 2023-07-19 — End: 2023-07-19
  Administered 2023-07-19: 40 meq via ORAL
  Filled 2023-07-19: qty 2

## 2023-07-19 MED ORDER — PROCHLORPERAZINE MALEATE 10 MG PO TABS: 10.0000 mg | ORAL_TABLET | Freq: Two times a day (BID) | ORAL | 0 refills | Status: DC | PRN

## 2023-07-19 MED ORDER — IOHEXOL 300 MG/ML  SOLN
100.0000 mL | Freq: Once | INTRAMUSCULAR | Status: AC | PRN
  Administered 2023-07-19: 100 mL via INTRAVENOUS

## 2023-07-19 MED ORDER — SODIUM CHLORIDE 0.9 % IV SOLN: INTRAVENOUS | Status: DC

## 2023-07-19 NOTE — ED Triage Notes (Addendum)
 Patient c/o weakness x 2 days. Patient report she had her chemo done yesterday. Patient c/o nausea denies vomiting. Patient report diarrhea x 4 today. Patient PRN zofran  without relief.  Hx Breast ca.

## 2023-07-19 NOTE — ED Notes (Signed)
Pt aware of need for urine specimen.  Pt unable to void at this time.

## 2023-07-19 NOTE — Discharge Instructions (Addendum)
 Your potassium was low today but was replaced while in the emergency department.  Your test results were otherwise reassuring.  A prescription for Compazine was sent to your pharmacy.  Take this only as needed if your Zofran  is not effective at treating your nausea.  Drink plenty fluids to stay hydrated.  Return to the emergency department for any new or worsening symptoms of concern.

## 2023-07-19 NOTE — ED Notes (Signed)
 Called lab to add on Mag and Lipase

## 2023-07-19 NOTE — ED Provider Notes (Signed)
 [facility] AT [facility] Provider Note   CSN: [id number] Arrival date & time: 07/19/23  1439     History  Chief Complaint  Patient presents with   Weakness    [name] is a 63 y.o. female.   Weakness Associated symptoms: nausea   Patient presents for nausea.  Medical history includes HTN, hypothyroidism, hemorrhoids, GERD, esophageal stricture, depression, cholelithiasis, anxiety, breast cancer.  Breast cancer was initially diagnosed in 2018.  She underwent bilateral mastectomy and adjuvant radiation.  She has been on antiestrogen therapy.  She developed intentional weight loss this year.  Imaging showed bone lesions.  Biopsy 1 month ago confirmed metastatic breast cancer.  She was started on chemotherapy.  Yesterday, she completed her initial 3 weeks of chemotherapy.  She will not have 1 week off prior to restarting.  Since her session yesterday, she has had nausea without vomiting.  She has not been able to eat or drink due to fear of vomiting.  She has a nonbloody diarrhea.  She has been taking her home Zofran  without relief.       Home Medications Prior to Admission medications   Medication Sig Start Date End Date Taking? Authorizing Provider  prochlorperazine  (COMPAZINE ) 10 MG tablet Take 1 tablet (10 mg total) by mouth 2 (two) times daily as needed for nausea or vomiting. 07/19/23  Yes Iva Mariner, MD  ALPRAZolam  (XANAX ) 1 MG tablet Take 1 tablet by mouth 3 (three) times daily.    [provider]  anastrozole  (ARIMIDEX ) 1 MG tablet Take 1 tablet (1 mg total) by mouth daily. 06/09/23   Sonja Channelview, MD  celecoxib  (CELEBREX ) 200 MG capsule Take 1 capsule (200 mg total) by mouth daily. Patient taking differently: Take 200 mg by mouth as needed. 10/31/13   Derl Flemings, PA-C  cloNIDine  (CATAPRES ) 0.1 MG tablet TAKE 1 TABLET BY MOUTH EVERY DAY 03/10/23   Boscia, Heather E, NP  gabapentin  (NEURONTIN ) 100 MG capsule Take 1 capsule every  day by oral route at dinner. 09/28/22   [provider]  KISQALI, 600 MG DOSE, 200 MG Therapy Pack TAKE 3 TABLETS BY MOUTH 1 TIME A DAY FOR 21 DAYS ON, 7 DAYS OFF, REPEAT EVERY 28 DAYS. TAKE AS INSTRUCTED PER MD. 07/13/23   Sonja Shaw, MD  levothyroxine  (SYNTHROID , LEVOTHROID) 50 MCG tablet Take 50 mcg by mouth daily before breakfast.    [provider]  omeprazole  (PRILOSEC) 40 MG capsule Take 1 capsule (40 mg total) by mouth 2 (two) times daily. 02/18/23   Graciella Lavender, PA  ondansetron  (ZOFRAN ) 8 MG tablet Take 1 tablet (8 mg total) by mouth every 4 (four) hours as needed for nausea or vomiting. 07/15/23   Sharyon Deis, NP  oxyCODONE -acetaminophen  (PERCOCET/ROXICET) 5-325 MG tablet Take 1 tablet by mouth every 6 (six) hours as needed for severe pain (pain score 7-10). 07/15/23   Boscia, Heather E, NP  venlafaxine  XR (EFFEXOR -XR) 150 MG 24 hr capsule TAKE 1 CAPSULE BY MOUTH EVERY DAY WITH BREAKFAST 03/14/21   Sonja Elwood, MD  venlafaxine  XR (EFFEXOR -XR) 75 MG 24 hr capsule TAKE 1 CAPSULE BY MOUTH DAILY WITH BREAKFAST. 05/07/21   Sonja Shelbina, MD  zolpidem (AMBIEN) 5 MG tablet TAKE 1 TABLET BY MOUTH EVERYDAY AT BEDTIME 07/29/22   [provider]      Allergies    Adhesive [tape], Promethazine hcl, and Sulfa  antibiotics    Review of Systems   Review of Systems  Constitutional:  Positive for appetite change.  Gastrointestinal:  Positive for nausea.  Neurological:  Positive for weakness (Generalized).  All other systems reviewed and are negative.   Physical Exam Updated Vital Signs BP 118/76   Pulse 89   Temp 98.3 F (36.8 C)   Resp 18   Ht 5\' 5"  (1.651 m)   Wt 56.6 kg   SpO2 100%   BMI 20.76 kg/m  Physical Exam Vitals and nursing note reviewed.  Constitutional:      General: She is not in acute distress.    Appearance: Normal appearance. She is well-developed. She is not ill-appearing, toxic-appearing or diaphoretic.  HENT:     Head: Normocephalic and  atraumatic.     Right Ear: External ear normal.     Left Ear: External ear normal.     Nose: Nose normal.     Mouth/Throat:     Mouth: Mucous membranes are moist.  Eyes:     Extraocular Movements: Extraocular movements intact.     Conjunctiva/sclera: Conjunctivae normal.  Cardiovascular:     Rate and Rhythm: Normal rate and regular rhythm.  Pulmonary:     Effort: Pulmonary effort is normal. No respiratory distress.     Breath sounds: No wheezing or rales.  Abdominal:     General: There is no distension.     Palpations: Abdomen is soft.     Tenderness: There is no abdominal tenderness.  Musculoskeletal:        General: No swelling. Normal range of motion.     Cervical back: Normal range of motion and neck supple.  Skin:    General: Skin is warm and dry.     Coloration: Skin is not jaundiced or pale.  Neurological:     General: No focal deficit present.     Mental Status: She is alert and oriented to person, place, and time.  Psychiatric:        Mood and Affect: Mood normal.        Behavior: Behavior normal.     ED Results / Procedures / Treatments   Labs (all labs ordered are listed, but only abnormal results are displayed) Labs Reviewed  COMPREHENSIVE METABOLIC PANEL WITH GFR - Abnormal; Notable for the following components:      Result Value   Sodium 134 (*)    Potassium 2.8 (*)    Glucose, Bld 123 (*)    Calcium 8.2 (*)    AST 88 (*)    ALT 113 (*)    Alkaline Phosphatase 165 (*)    Total Bilirubin 1.4 (*)    All other components within normal limits  CBC - Abnormal; Notable for the following components:   WBC 3.1 (*)    RBC 5.47 (*)    HCT 47.1 (*)    RDW 19.9 (*)    All other components within normal limits  URINALYSIS, ROUTINE W REFLEX MICROSCOPIC - Abnormal; Notable for the following components:   Ketones, ur 20 (*)    Leukocytes,Ua SMALL (*)    Bacteria, UA RARE (*)    All other components within normal limits  LIPASE, BLOOD  MAGNESIUM     EKG None  Radiology CT ABDOMEN PELVIS W CONTRAST Result Date: 07/19/2023 CLINICAL DATA:  Abdominal pain, acute, nonlocalized weakness x 2 days. Patient report she had her chemo done yesterday. Nausea. EXAM: CT ABDOMEN AND PELVIS WITH CONTRAST TECHNIQUE: Multidetector CT imaging of the abdomen and pelvis was performed using the standard protocol following bolus administration  of intravenous contrast. RADIATION DOSE REDUCTION: This exam was performed according to the departmental dose-optimization program which includes automated exposure control, adjustment of the mA and/or kV according to patient size and/or use of iterative reconstruction technique. CONTRAST:  OMNIPAQUE  IOHEXOL  300 MG/ML  SOLN COMPARISON:  PET CT 06/01/2023 FINDINGS: Lower chest: No acute abnormality. Hepatobiliary: Hypodensity along the falciform ligament likely focal fatty infiltration. Status post cholecystectomy. No biliary dilatation. Pancreas: Diffusely atrophic. No focal lesion. Otherwise normal pancreatic contour. No surrounding inflammatory changes. No main pancreatic ductal dilatation. Spleen: Normal in size without focal abnormality. Adrenals/Urinary Tract: No adrenal nodule bilaterally. Bilateral kidneys enhance symmetrically.  Extrarenal pelvises. No hydronephrosis. No hydroureter.  No nephroureterolithiasis. The urinary bladder is unremarkable. On delayed imaging, there is no urothelial wall thickening and there are no filling defects in the opacified portions of the bilateral collecting systems or ureters. Stomach/Bowel: Stomach is within normal limits. No evidence of bowel wall thickening or dilatation. Fluid throughout the lumen of the large bowel. Appendix appears normal. Vascular/Lymphatic: No abdominal aorta or iliac aneurysm. No abdominal, pelvic, or inguinal lymphadenopathy. Reproductive: Status post hysterectomy. No adnexal masses. Other: No intraperitoneal free fluid. No intraperitoneal free gas. No  organized fluid collection. Musculoskeletal: No abdominal wall hernia or abnormality. Similar-appearing partially visualized sclerosis of the T9 vertebral body. Similar-appearing sclerosis of the S1 vertebral body. No acute displaced fracture. Multilevel degenerative changes of the spine. IMPRESSION: 1. Diarrheal state. 2. Sclerotic osseous metastases. Electronically Signed   By: Morgane  Naveau M.D.   On: 07/19/2023 18:04   US  Abdomen Limited RUQ (LIVER/GB) Result Date: 07/19/2023 CLINICAL DATA:  Abdominal pain. Prior cholecystectomy. Breast cancer. EXAM: ULTRASOUND ABDOMEN LIMITED RIGHT UPPER QUADRANT COMPARISON:  PET-CT 06/01/2023 FINDINGS: Gallbladder: Surgically absent. Common bile duct: Diameter: 0.2 cm Liver: No focal lesion identified. Within normal limits in parenchymal echogenicity. Portal vein is patent on color Doppler imaging with normal direction of blood flow towards the liver. Other: None. IMPRESSION: 1. No hepatic abnormality identified.  No biliary dilatation. 2. Prior cholecystectomy. Electronically Signed   By: Freida Jes M.D.   On: 07/19/2023 17:32    Procedures Procedures    Medications Ordered in ED Medications  0.9 %  sodium chloride  infusion ( Intravenous New Bag/Given 07/19/23 1723)  lactated ringers  bolus 1,000 mL (0 mLs Intravenous Stopped 07/19/23 1724)  metoCLOPramide  (REGLAN ) injection 10 mg (10 mg Intravenous Given 07/19/23 1608)  potassium chloride  SA (KLOR-CON  M) CR tablet 40 mEq (40 mEq Oral Given 07/19/23 1644)  potassium chloride  10 mEq in 100 mL IVPB (0 mEq Intravenous Stopped 07/19/23 1937)  iohexol  (OMNIPAQUE ) 300 MG/ML solution 100 mL (100 mLs Intravenous Contrast Given 07/19/23 1734)  ketorolac  (TORADOL ) 15 MG/ML injection 15 mg (15 mg Intravenous Given 07/19/23 1858)  prochlorperazine (COMPAZINE) injection 10 mg (10 mg Intravenous Given 07/19/23 1859)  diphenhydrAMINE  (BENADRYL ) injection 12.5 mg (12.5 mg Intravenous Given 07/19/23 1857)  lactated  ringers  bolus 1,000 mL (1,000 mLs Intravenous New Bag/Given 07/19/23 1938)    ED Course/ Medical Decision Making/ A&P                                 Medical Decision Making Amount and/or Complexity of Data Reviewed Labs: ordered. Radiology: ordered.  Risk Prescription drug management.   This patient presents to the ED for concern of nausea, this involves an extensive number of treatment options, and is a complaint that carries with it a high risk of  complications and morbidity.  The differential diagnosis includes side effects of chemotherapy, dehydration, gastritis, GERD, enteritis, cholecystitis, pancreatitis, colitis   Co morbidities / Chronic conditions that complicate the patient evaluation  HTN, hypothyroidism, hemorrhoids, GERD, esophageal stricture, depression, cholelithiasis, anxiety, breast cancer   Additional history obtained:  Additional history obtained from EMR External records from outside source obtained and reviewed including N/A   Lab Tests:  I Ordered, and personally interpreted labs.  The pertinent results include:  Hypokalemia with otherwise normal electrolytes, mild elevations in hepatobiliary enzymes, mild leukopenia, equivocal urinalysis   Imaging Studies ordered:  I ordered imaging studies including right upper quadrant ultrasound, CT of abdomen and pelvis I independently visualized and interpreted imaging which showed evidence of diarrheal illness without other acute findings I agree with the radiologist interpretation   Cardiac Monitoring: / EKG:  The patient was maintained on a cardiac monitor.  I personally viewed and interpreted the cardiac monitored which showed an underlying rhythm of: Sinus rhythm   Problem List / ED Course / Critical interventions / Medication management  Patient presenting for nausea and p.o. intolerance since yesterday.  She was recently started on chemotherapy and completed her first 3-week cycle yesterday.  On  arrival in the ED, vital signs are normal.  Patient is well-appearing on exam.  Abdomen is soft and nontender.  She states that she has tried Zofran  at home without relief.  Will try other antiemetics here.  IV fluids ordered for hydration.  Laboratory workup initiated.  Lab work was notable for hypokalemia.  Replace potassium was ordered.  She has mild elevations in her hepatobiliary enzymes.  This did prompt imaging studies which did not show any evidence of hepatobiliary disease.  She does have evidence of a diarrheal illness.  I suspect this is secondary to her recent chemotherapy.  She had headache and ongoing nausea while in the ED.  Headache cocktail was ordered with Compazine.  This did seem to treat her nausea more effectively.  Patient's urinalysis was equivocal.  In the absence of any urinary symptoms, do not feel she needs antibiotics at this time.  She was able to eat and drink.  She was discharged in stable condition. I ordered medication including IV fluids for hydration; potassium chloride  for hypokalemia; Reglan  and Compazine for nausea; Toradol , Compazine, Benadryl  for headache Reevaluation of the patient after these medicines showed that the patient improved I have reviewed the patients home medicines and have made adjustments as needed  Social Determinants of Health:  Has access to outpatient care        Final Clinical Impression(s) / ED Diagnoses Final diagnoses:  Hypokalemia  Nausea    Rx / DC Orders ED Discharge Orders          Ordered    prochlorperazine (COMPAZINE) 10 MG tablet  2 times daily PRN        07/19/23 1938              Iva Mariner, MD 07/19/23 1939

## 2023-07-21 ENCOUNTER — Telehealth: Payer: Self-pay | Admitting: Hematology

## 2023-07-21 ENCOUNTER — Other Ambulatory Visit: Payer: Self-pay

## 2023-07-21 ENCOUNTER — Other Ambulatory Visit (HOSPITAL_COMMUNITY): Payer: Self-pay

## 2023-07-21 ENCOUNTER — Telehealth: Payer: Self-pay

## 2023-07-21 MED ORDER — NYSTATIN 100000 UNIT/ML MT SUSP
5.0000 mL | Freq: Four times a day (QID) | OROMUCOSAL | 1 refills | Status: DC | PRN
  Filled 2023-07-21: qty 140, 7d supply, fill #0

## 2023-07-21 NOTE — Telephone Encounter (Signed)
 Patient called in stating she had a rough weekend. She is on her 7 days off of Kisqali as of Friday. Over the weekend she became very weak  had nausea and diarrhea so bad she went into the ER on Monday. In the ER they gave her 2 liters of fluids and Potassium. Patient states she has ulcers in her mouth as well. She is feeling better over all but is due to start the meds back on Friday wanted to know if she should proceed with medication or modify it. Wanted to make Dr. Maryalice Smaller aware before starting again this weekend. Spoke with Dr. Maryalice Smaller she wants her or one of her  NP to see her this week before she starts her next round. Contacted patient to get her scheduled she had no further questions at this time. Will call magic mouthwash in to [facility].

## 2023-07-22 ENCOUNTER — Other Ambulatory Visit: Payer: Self-pay | Admitting: Nurse Practitioner

## 2023-07-22 ENCOUNTER — Inpatient Hospital Stay: Admitting: Nurse Practitioner

## 2023-07-22 ENCOUNTER — Other Ambulatory Visit (HOSPITAL_COMMUNITY): Payer: Self-pay

## 2023-07-22 ENCOUNTER — Inpatient Hospital Stay

## 2023-07-22 VITALS — BP 116/64 | HR 86 | Temp 97.7°F | Resp 17 | Wt 123.1 lb

## 2023-07-22 DIAGNOSIS — C50811 Malignant neoplasm of overlapping sites of right female breast: Secondary | ICD-10-CM

## 2023-07-22 DIAGNOSIS — Z170 Estrogen receptor positive status [ER+]: Secondary | ICD-10-CM

## 2023-07-22 DIAGNOSIS — C773 Secondary and unspecified malignant neoplasm of axilla and upper limb lymph nodes: Secondary | ICD-10-CM | POA: Diagnosis not present

## 2023-07-22 DIAGNOSIS — Z79811 Long term (current) use of aromatase inhibitors: Secondary | ICD-10-CM | POA: Diagnosis not present

## 2023-07-22 DIAGNOSIS — Z9013 Acquired absence of bilateral breasts and nipples: Secondary | ICD-10-CM | POA: Diagnosis not present

## 2023-07-22 DIAGNOSIS — Z1732 Human epidermal growth factor receptor 2 negative status: Secondary | ICD-10-CM | POA: Diagnosis not present

## 2023-07-22 DIAGNOSIS — C7951 Secondary malignant neoplasm of bone: Secondary | ICD-10-CM | POA: Diagnosis not present

## 2023-07-22 DIAGNOSIS — Z87891 Personal history of nicotine dependence: Secondary | ICD-10-CM | POA: Diagnosis not present

## 2023-07-22 DIAGNOSIS — G893 Neoplasm related pain (acute) (chronic): Secondary | ICD-10-CM | POA: Diagnosis not present

## 2023-07-22 DIAGNOSIS — Z1721 Progesterone receptor positive status: Secondary | ICD-10-CM | POA: Diagnosis not present

## 2023-07-22 LAB — CMP (CANCER CENTER ONLY)
ALT: 35 U/L (ref 0–44)
AST: 13 U/L — ABNORMAL LOW (ref 15–41)
Albumin: 3.9 g/dL (ref 3.5–5.0)
Alkaline Phosphatase: 98 U/L (ref 38–126)
Anion gap: 5 (ref 5–15)
BUN: 7 mg/dL — ABNORMAL LOW (ref 8–23)
CO2: 34 mmol/L — ABNORMAL HIGH (ref 22–32)
Calcium: 8.8 mg/dL — ABNORMAL LOW (ref 8.9–10.3)
Chloride: 104 mmol/L (ref 98–111)
Creatinine: 0.84 mg/dL (ref 0.44–1.00)
GFR, Estimated: >60 mL/min (ref >60)
Glucose, Bld: 88 mg/dL (ref 70–99)
Potassium: 3.3 mmol/L — ABNORMAL LOW (ref 3.5–5.1)
Sodium: 143 mmol/L (ref 135–145)
Total Bilirubin: 0.4 mg/dL (ref 0.0–1.2)
Total Protein: 6.1 g/dL — ABNORMAL LOW (ref 6.5–8.1)

## 2023-07-22 LAB — CBC WITH DIFFERENTIAL (CANCER CENTER ONLY)
Abs Immature Granulocytes: 0 10*3/uL (ref 0.00–0.07)
Basophils Absolute: 0 10*3/uL (ref 0.0–0.1)
Basophils Relative: 1 %
Eosinophils Absolute: 0.2 10*3/uL (ref 0.0–0.5)
Eosinophils Relative: 7 %
HCT: 41.3 % (ref 36.0–46.0)
Hemoglobin: 13.7 g/dL (ref 12.0–15.0)
Immature Granulocytes: 0 %
Lymphocytes Relative: 55 %
Lymphs Abs: 1.4 10*3/uL (ref 0.7–4.0)
MCH: 27.5 pg (ref 26.0–34.0)
MCHC: 33.2 g/dL (ref 30.0–36.0)
MCV: 82.9 fL (ref 80.0–100.0)
Monocytes Absolute: 0.3 10*3/uL (ref 0.1–1.0)
Monocytes Relative: 11 %
Neutro Abs: 0.7 10*3/uL — ABNORMAL LOW (ref 1.7–7.7)
Neutrophils Relative %: 26 %
Platelet Count: 170 10*3/uL (ref 150–400)
RBC: 4.98 MIL/uL (ref 3.87–5.11)
RDW: 19.3 % — ABNORMAL HIGH (ref 11.5–15.5)
WBC Count: 2.5 10*3/uL — ABNORMAL LOW (ref 4.0–10.5)
nRBC: 0 % (ref 0.0–0.2)

## 2023-07-22 LAB — MAGNESIUM: Magnesium: 1.4 mg/dL — ABNORMAL LOW (ref 1.7–2.4)

## 2023-07-22 MED ORDER — VALACYCLOVIR HCL 500 MG PO TABS: 500.0000 mg | ORAL_TABLET | Freq: Two times a day (BID) | ORAL | 1 refills | Status: DC

## 2023-07-22 MED ORDER — POTASSIUM CHLORIDE CRYS ER 10 MEQ PO TBCR: 10.0000 meq | EXTENDED_RELEASE_TABLET | Freq: Every day | ORAL | 1 refills | Status: DC

## 2023-07-22 NOTE — Assessment & Plan Note (Addendum)
 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 S/p bilateral mastectomy with reconstruction and adjuvant radiation. (Implants ultimately removed 01/24/19 by Dr. Marieta Shorten.) -She started antiestrogen therapy Exemestane  on 04/01/16, planned for 7 years. She experienced joint pain and hot flashes on exemestane , anastrozole , and tamoxifen . She opted to continue anastrozole . Given her continued difficulty with joint pain, as well as her low risk mammaprint, she stopped after she completed 5 years therapy in 03/2021 -she developed low appetite and weight loss in Feb 2025, CT and bone scan showed bone lesions. PET on 06/01/2023 showed diffuse hypermetabolic bone lesions and hypermetabolic small lymph node in the right axillary region and supraclavicular region on the right low neck, concerning for metastatic disease. -[facility] node biopsy on 06/22/2023 confirmed metastatic breast cancer, will request NGS  -she started anastrozole  in 05/2023, will add Ribciclib and zometa   - Started Kisqali 2 weeks ago.  Currently on 7 days off Kisqali. Overall, tolerating this very well. Recommend she restart as scheduled 07/23/2023. Start with 2 tablets daily for 3 days then increase to 3 tablets daily.

## 2023-07-22 NOTE — Progress Notes (Signed)
 Patient Care Team: Jhon Moselle, FNP as PCP - General (Internal Medicine) [place], Robbie Chiles, MD (Inactive) (Orthopedic Surgery) Albert Huff, MD (Ophthalmology) Devon Fogo, MD (Inactive) (Dermatology) Sonja Cosho[place]on, MD as Consulting Physician (Hematology) Enid Harry, MD as Consulting Physician (General Surgery) Johna Myers, MD as Consulting Physician (Radiation Oncology) Percival Brace, NP as Nurse Practitioner (Hematology and Oncology)  Clinic Day:  07/25/2023  Referring physician: Jhon Moselle, FNP  ASSESSMENT & PLAN:   Assessment & Plan: Malignant neoplasm of overlapping sites of right breast in female, estrogen receptor positive (HCC)  mpT2 pN1a, stage IA, G1,  ER positive, PR positive, HER-2 negative, mammaprint low risk luminal type A -Diagnosed in 02/2016. S/p bilateral mastectomy with reconstruction and adjuvant radiation. (Implants ultimately removed 01/24/19 by Dr. Marieta Shorten.) -She started antiestrogen therapy Exemestane  on 04/01/16, planned for 7 years. She experienced joint pain and hot flashes on exemestane , anastrozole , and tamoxifen . She opted to continue anastrozole . Given her continued difficulty with joint pain, as well as her low risk mammaprint, she stopped after she completed 5 years therapy in 03/2021 -she developed low appetite and weight loss in Feb 2025, CT and bone scan showed bone lesions. PET on 06/01/2023 showed diffuse hypermetabolic bone lesions and hypermetabolic small lymph node in the right axillary region and supraclavicular region on the right low neck, concerning for metastatic disease. -[place] node biopsy on 06/22/2023 confirmed metastatic breast cancer, will request NGS  -she started anastrozole  in 05/2023, will add Ribciclib and zometa   - Started Kisqali 2 weeks ago.  Currently on 7 days off Kisqali. Overall, tolerating this very well. Recommend she restart as scheduled 07/23/2023. Start with 2 tablets daily for 3 days then increase  to 3 tablets daily.     Plan: Reviewed labs -Slight leukopenia.  WBC 2.5 and ANC 0.7.  Suspect GI viral infection that caused significant nausea and vomiting for which she was treated for the ED.  Hgb normal at 13.7 and HCT normal at 41.3. -Mild but improved hypokalemia with K equals 3.3.  Remainder of CMP essentially normal. - Slight reduced magnesium at 1.4. Patient scheduled to restart [facility] tomorrow.  Recommend she begin with 2 tablets daily for 3 days, then increase to 3 tablets daily.  Continue pattern of 3 weeks taking Kisqali and 1 week break.  If your GI symptoms begin again, will recommend she stop Kisqali and recommend different treatment. Continue anastrozole  daily. Labs and follow-up as currently scheduled.  Advised her to call sooner if needed.  The patient understands the plans discussed today and is in agreement with them.  She knows to contact our office if she develops concerns prior to her next appointment.  I provided 25 minutes of face-to-face time during this encounter and > 50% was spent counseling as documented under my assessment and plan.    Sharyon Deis, NP  [facility] - A DEPT OF [facility]. [facility] [address] [place] [address] Dept: [phone] Dept Fax: [phone]   No orders of the defined types were placed in this encounter.     CHIEF COMPLAINT:  CC: Metastatic breast cancer, estrogen receptor positive  Current Treatment:  Kisqali   INTERVAL HISTORY:  Iyania is here today for repeat clinical assessment. Seen in ED on 07/19/2023 due to severe nausea, vomiting, and diarrhea. She received 2 liters of fluid and IV potassium. Potassium in ED was 2.8. she also developed oral ulcers. Yesterday, magic mouthwash  was sent to [facility]. Due to restart Kisqali tomorrow.  Patient feeling much better today.  Has not had another ground-level fall diarrhea in last 2 days.   Reviewed labs with improved CMP and CBC.  No new symptoms or concerns.  She denies chest pain, chest pressure, or shortness of breath. She denies headaches or visual disturbances.  She denies fevers or chills. She denies pain. Her appetite is good. Her weight has been stable.  I have reviewed the past medical history, past surgical history, social history and family history with the patient and they are unchanged from previous note.  ALLERGIES:  is allergic to adhesive [tape], promethazine hcl, and sulfa  antibiotics.  MEDICATIONS:  Current Outpatient Medications  Medication Sig Dispense Refill   magic mouthwash (nystatin , lidocaine , diphenhydrAMINE , alum & mag hydroxide) suspension Swish and swallow 5 mLs by mouth 4 (four) times daily as needed for mouth pain. 140 mL 1   potassium chloride  (KLOR-CON  M) 10 MEQ tablet Take 1 tablet (10 mEq total) by mouth daily. 30 tablet 1   valACYclovir  (VALTREX ) 500 MG tablet Take 1 tablet (500 mg total) by mouth 2 (two) times daily. 10 tablet 1   ALPRAZolam  (XANAX ) 1 MG tablet Take 1 tablet by mouth 3 (three) times daily.     anastrozole  (ARIMIDEX ) 1 MG tablet Take 1 tablet (1 mg total) by mouth daily. 90 tablet 1   celecoxib  (CELEBREX ) 200 MG capsule Take 1 capsule (200 mg total) by mouth daily. (Patient taking differently: Take 200 mg by mouth as needed.) 30 capsule 2   cloNIDine  (CATAPRES ) 0.1 MG tablet TAKE 1 TABLET BY MOUTH EVERY DAY 90 tablet 1   gabapentin  (NEURONTIN ) 100 MG capsule Take 1 capsule every day by oral route at dinner.     KISQALI, 600 MG DOSE, 200 MG Therapy Pack TAKE 3 TABLETS BY MOUTH 1 TIME A DAY FOR 21 DAYS ON, 7 DAYS OFF, REPEAT EVERY 28 DAYS. TAKE AS INSTRUCTED PER MD. 63 tablet 0   levothyroxine  (SYNTHROID , LEVOTHROID) 50 MCG tablet Take 50 mcg by mouth daily before breakfast.     omeprazole  (PRILOSEC) 40 MG capsule Take 1 capsule (40 mg total) by mouth 2 (two) times daily. 180 capsule 2   ondansetron  (ZOFRAN ) 8 MG tablet Take 1  tablet (8 mg total) by mouth every 4 (four) hours as needed for nausea or vomiting. 135 tablet 1   oxyCODONE -acetaminophen  (PERCOCET/ROXICET) 5-325 MG tablet Take 1 tablet by mouth every 6 (six) hours as needed for severe pain (pain score 7-10). 30 tablet 0   prochlorperazine  (COMPAZINE ) 10 MG tablet Take 1 tablet (10 mg total) by mouth 2 (two) times daily as needed for nausea or vomiting. 16 tablet 0   venlafaxine  XR (EFFEXOR -XR) 150 MG 24 hr capsule TAKE 1 CAPSULE BY MOUTH EVERY DAY WITH BREAKFAST 90 capsule 1   venlafaxine  XR (EFFEXOR -XR) 75 MG 24 hr capsule TAKE 1 CAPSULE BY MOUTH DAILY WITH BREAKFAST. 90 capsule 2   zolpidem (AMBIEN) 5 MG tablet TAKE 1 TABLET BY MOUTH EVERYDAY AT BEDTIME     No current facility-administered medications for this visit.    HISTORY OF PRESENT ILLNESS:   Oncology History Overview Note  Cancer Staging Malignant neoplasm of overlapping sites of right breast in female, estrogen receptor positive (HCC) Staging form: Breast, AJCC 8th Edition - Clinical stage from 03/23/2016: Stage IIA (cT3, cN1, cM0, G2, ER: Positive, PR: Positive, HER2: Negative) - Signed by Sonja [place], MD on 03/31/2016 - Pathologic stage  from 05/19/2016: Stage IA (pT2(m), pN1a, cM0, G1, ER: Positive, PR: Positive, HER2: Negative) - Signed by Sonja Belfast, MD on 06/18/2016     Malignant neoplasm of overlapping sites of right breast in female, estrogen receptor positive (HCC)  03/17/2016 Mammogram   B/l diagnostic mammogram and righ US  showed a 3.6cm irregular mass in the right breast 11:00 position, posterior depth, there is a enlarged lymph node in the right axilla is highly suspicious for malignancy. additional 7 mm oval mass in the right breast lower outer quadrant is suspicious for malignancy.   03/23/2016 Initial Biopsy   Right breast 9:30 position biopsy showed invasive ductal carcinoma, grade 1. 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   03/23/2016 Receptors her2   Both  breast and node biopsy tumor ER 100% positive, PR 70-95% positive, HER-2 negative, Ki-67 40%   03/23/2016 Initial Diagnosis   Malignant neoplasm of upper-outer quadrant of right breast in female, estrogen receptor positive (HCC)   03/25/2016 Initial Biopsy   Right breast 11:00 position core needle biopsy showed invasive duct carcinoma, grade 2.    03/25/2016 Receptors her2   ER 95% positive, PR 90% positive, HER-2 negative, Ki-67 15%   03/25/2016 Miscellaneous   Mammaprint showed low risk type A with index +0.105   03/30/2016 Imaging   Bilateral breast MRI with and without contrast showed a large lobulated enhancing mass within the upper-outer and lower outer right breast with surrounding nodularity, measuring 6.1 x 4.4 x 5.6 cm. Multiple critically sick and right axillary lymph nodes are demonstrated measuring up to 1.5 cm.    04/01/2016 -  Anti-estrogen oral therapy   Exemestane  25 mg daily, plan for 7 years. Switched to Anastrozole  1mg  in 06/2017 due to joint pain and hot flashes. Due to persistent side effects I changed her to Tamoxifen  in 09/2018. She stopped Tamoxifen  and switched back to anastrozole  in 01/2019 because it was more tolerable.    05/19/2016 Surgery   Bilateral mastectomy and right axillary regional lymph node resection.   05/19/2016 Pathology Results   -Right axillary regional lymph node resection revealed metastatic carcinoma in 2/7 lymph nodes. -Left simple mastectomy revealed lobular neoplasia and fibrocystic changes with adenosis and calcifications. 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 -Skin of the right mastectomy flap was benign. -mpT2, pN1a   06/25/2016 - 08/11/2016 Radiation Therapy   Site/dose:    1. 4 field Right breast was treated to 50.4 Gy in 25  fractions at 1.8 Gy per fraction. 2. The Right breast was boosted to 10 Gy in 5 fractions at 2 Gy per fraction.   04/15/2017 Genetic Testing   The patient had genetic testing due to a personal history of breast cancer and family history of breast, ovarian, and colon cancer. The Common Hereditary Cancer Panel was ordered.  The Common Hereditary Cancer Panel offered by [facility] includes sequencing and/or deletion duplication testing of the following 47 genes: APC, ATM, AXIN2, BARD1, BMPR1A, BRCA1, BRCA2, BRIP1, CDH1, CDKN2A (p14ARF), CDKN2A (p16INK4a), CKD4, CHEK2, CTNNA1, DICER1, EPCAM (Deletion/duplication testing only), GREM1 (promoter region deletion/duplication testing only), KIT, MEN1, MLH1, MSH2, MSH3, MSH6, MUTYH, NBN, NF1, NHTL1, PALB2, PDGFRA, PMS2, POLD1, POLE, PTEN, RAD50, RAD51C, RAD51D, SDHB, SDHC, SDHD, SMAD4, SMARCA4. STK11, TP53, TSC1, TSC2, and VHL.  The following genes were evaluated for sequence changes only: SDHA and HOXB13 c.251G>A variant only.  Results: Negative, no pathogenic variants identified. The date of this test report is 04/15/2017   06/28/2017 Surgery   COMPLEX REVISION OF BACK SCAR and LEFT BREAST RECONSTRUCTION WITH SILICONE IMPLANT EXCHANGE AND ACELLULARDERMIS TO LEFT CHEST by Dr. Marieta Shorten 06/28/17   06/13/2023 Pathology Results   FINAL MICROSCOPIC DIAGNOSIS: A. LYMPH NODE, BIOPSY: -  Metastatic carcinoma morphologically consistent with the patient's known breast primary. ADDENDUM:  An immunohistochemical stain for GATA3 is positive supporting the morphological and clinical impression of breast primary. ADDENDUM: Lymph node, biopsy - Metastatic carcinoma  PROGNOSTIC INDICATORS Results:  IMMUNOHISTOCHEMICAL AND MORPHOMETRIC ANALYSIS PERFORMED MANUALLY  The tumor cells are negative for Her2 (1+). Estrogen Receptor:  70%, positive, strong staining intensity Progesterone Receptor:  80%, positive, strong staining intensity Proliferation Marker Ki67: 20% COMMENT:  The  negative hormone receptor study(ies) in this case has an internal positive control.  REFERENCE RANGE ESTROGEN RECEPTOR        NEGATIVE     0%        POSITIVE       =>1% REFERENCE RANGE PROGESTERONE RECEPTOR        NEGATIVE     0%        POSITIVE        =>1% All controls stained appropriately         REVIEW OF SYSTEMS:   Constitutional: Denies fevers, chills or abnormal weight loss Eyes: Denies blurriness of vision Ears, nose, mouth, throat, and face: Denies mucositis or sore throat Respiratory: Denies cough, dyspnea or wheezes Cardiovascular: Denies palpitation, chest discomfort or lower extremity swelling Gastrointestinal:  Denies nausea, heartburn or change in bowel habits Skin: Denies abnormal skin rashes Lymphatics: Denies new lymphadenopathy or easy bruising Neurological:Denies numbness, tingling or new weaknesses Behavioral/Psych: Mood is stable, no new changes  All other systems were reviewed with the patient and are negative.   VITALS:   Today's Vitals   07/22/23 1048  BP: 116/64  Pulse: 86  Resp: 17  Temp: 97.7 F (36.5 C)  SpO2: 98%  Weight: 123 lb 1.6 oz (55.8 kg)   Body mass index is 20.48 kg/m.   Wt Readings from Last 3 Encounters:  07/22/23 123 lb 1.6 oz (55.8 kg)  07/19/23 124 lb 12.5 oz (56.6 kg)  07/15/23 124 lb 12.8 oz (56.6 kg)    Body mass index is 20.48 kg/m.  Performance status (ECOG): 1 - Symptomatic but completely ambulatory  PHYSICAL EXAM:   GENERAL:alert, no distress and comfortable SKIN: skin color, texture, turgor are normal, no rashes or significant lesions EYES: normal, Conjunctiva are pink and non-injected, sclera clear OROPHARYNX:no exudate, no erythema and lips, buccal mucosa, and tongue normal  NECK: supple, thyroid  normal size, non-tender, without nodularity LYMPH:  no palpable lymphadenopathy in the cervical, axillary or inguinal LUNGS: clear to auscultation and percussion with normal breathing effort HEART: regular  rate & rhythm and no murmurs and no lower extremity edema ABDOMEN:abdomen soft, non-tender and normal bowel sounds Musculoskeletal:no cyanosis of digits and no clubbing  NEURO: alert & oriented x 3 with fluent speech, no focal motor/sensory deficits  LABORATORY DATA:  I have reviewed the data as listed    Component Value Date/Time   NA 143 07/22/2023 1041   NA 141 02/19/2017 1304   K 3.3 (L) 07/22/2023 1041   K 4.0 02/19/2017 1304   CL 104 07/22/2023 1041   CO2 34 (H) 07/22/2023 1041   CO2 26 02/19/2017 1304   GLUCOSE 88 07/22/2023 1041   GLUCOSE 99 02/19/2017  1304   BUN 7 (L) 07/22/2023 1041   BUN 16.4 02/19/2017 1304   CREATININE 0.84 07/22/2023 1041   CREATININE 0.9 02/19/2017 1304   CALCIUM 8.8 (L) 07/22/2023 1041   CALCIUM 8.8 02/19/2017 1304   PROT 6.1 (L) 07/22/2023 1041   PROT 6.2 (L) 02/19/2017 1304   ALBUMIN  3.9 07/22/2023 1041   ALBUMIN  3.6 02/19/2017 1304   AST 13 (L) 07/22/2023 1041   AST 22 02/19/2017 1304   ALT 35 07/22/2023 1041   ALT 23 02/19/2017 1304   ALKPHOS 98 07/22/2023 1041   ALKPHOS 88 02/19/2017 1304   BILITOT 0.4 07/22/2023 1041   BILITOT 0.39 02/19/2017 1304   GFRNONAA >60 07/22/2023 1041   GFRNONAA 72 11/06/2015 0831   GFRAA >60 10/12/2019 1445   GFRAA 83 11/06/2015 0831     Lab Results  Component Value Date   WBC 2.5 (L) 07/22/2023   NEUTROABS 0.7 (L) 07/22/2023   HGB 13.7 07/22/2023   HCT 41.3 07/22/2023   MCV 82.9 07/22/2023   PLT 170 07/22/2023     RADIOGRAPHIC STUDIES: CT ABDOMEN PELVIS W CONTRAST Result Date: 07/19/2023 CLINICAL DATA:  Abdominal pain, acute, nonlocalized weakness x 2 days. Patient report she had her chemo done yesterday. Nausea. EXAM: CT ABDOMEN AND PELVIS WITH CONTRAST TECHNIQUE: Multidetector CT imaging of the abdomen and pelvis was performed using the standard protocol following bolus administration of intravenous contrast. RADIATION DOSE REDUCTION: This exam was performed according to the departmental  dose-optimization program which includes automated exposure control, adjustment of the mA and/or kV according to patient size and/or use of iterative reconstruction technique. CONTRAST:  OMNIPAQUE  IOHEXOL  300 MG/ML  SOLN COMPARISON:  PET CT 06/01/2023 FINDINGS: Lower chest: No acute abnormality. Hepatobiliary: Hypodensity along the falciform ligament likely focal fatty infiltration. Status post cholecystectomy. No biliary dilatation. Pancreas: Diffusely atrophic. No focal lesion. Otherwise normal pancreatic contour. No surrounding inflammatory changes. No main pancreatic ductal dilatation. Spleen: Normal in size without focal abnormality. Adrenals/Urinary Tract: No adrenal nodule bilaterally. Bilateral kidneys enhance symmetrically.  Extrarenal pelvises. No hydronephrosis. No hydroureter.  No nephroureterolithiasis. The urinary bladder is unremarkable. On delayed imaging, there is no urothelial wall thickening and there are no filling defects in the opacified portions of the bilateral collecting systems or ureters. Stomach/Bowel: Stomach is within normal limits. No evidence of bowel wall thickening or dilatation. Fluid throughout the lumen of the large bowel. Appendix appears normal. Vascular/Lymphatic: No abdominal aorta or iliac aneurysm. No abdominal, pelvic, or inguinal lymphadenopathy. Reproductive: Status post hysterectomy. No adnexal masses. Other: No intraperitoneal free fluid. No intraperitoneal free gas. No organized fluid collection. Musculoskeletal: No abdominal wall hernia or abnormality. Similar-appearing partially visualized sclerosis of the T9 vertebral body. Similar-appearing sclerosis of the S1 vertebral body. No acute displaced fracture. Multilevel degenerative changes of the spine. IMPRESSION: 1. Diarrheal state. 2. Sclerotic osseous metastases. Electronically Signed   By: Morgane  Naveau M.D.   On: 07/19/2023 18:04   US  Abdomen Limited RUQ (LIVER/GB) Result Date: 07/19/2023 CLINICAL  DATA:  Abdominal pain. Prior cholecystectomy. Breast cancer. EXAM: ULTRASOUND ABDOMEN LIMITED RIGHT UPPER QUADRANT COMPARISON:  PET-CT 06/01/2023 FINDINGS: Gallbladder: Surgically absent. Common bile duct: Diameter: 0.2 cm Liver: No focal lesion identified. Within normal limits in parenchymal echogenicity. Portal vein is patent on color Doppler imaging with normal direction of blood flow towards the liver. Other: None. IMPRESSION: 1. No hepatic abnormality identified.  No biliary dilatation. 2. Prior cholecystectomy. Electronically Signed   By: Freida Jes M.D.   On: 07/19/2023  17:32   DG Chest Portable 1 View Result Date: 06/26/2023 CLINICAL DATA:  Chest pain. EXAM: PORTABLE CHEST 1 VIEW COMPARISON:  Chest radiograph dated 06/24/2006. FINDINGS: No focal consolidation, pleural effusion or pneumothorax. Multiple surgical clips over the right chest. The cardiac silhouette is within limits. No acute osseous pathology. IMPRESSION: No active disease. Electronically Signed   By: Angus Bark M.D.   On: 06/26/2023 16:26

## 2023-07-25 ENCOUNTER — Encounter: Payer: Self-pay | Admitting: Hematology

## 2023-07-25 ENCOUNTER — Encounter: Payer: Self-pay | Admitting: Nurse Practitioner

## 2023-08-02 ENCOUNTER — Other Ambulatory Visit: Payer: Self-pay | Admitting: Physician Assistant

## 2023-08-02 ENCOUNTER — Other Ambulatory Visit: Payer: Self-pay | Admitting: Nurse Practitioner

## 2023-08-02 DIAGNOSIS — T50905A Adverse effect of unspecified drugs, medicaments and biological substances, initial encounter: Secondary | ICD-10-CM

## 2023-08-11 ENCOUNTER — Other Ambulatory Visit: Payer: Self-pay | Admitting: Hematology

## 2023-08-11 DIAGNOSIS — Z170 Estrogen receptor positive status [ER+]: Secondary | ICD-10-CM

## 2023-08-13 ENCOUNTER — Other Ambulatory Visit: Payer: Self-pay | Admitting: Nurse Practitioner

## 2023-08-18 ENCOUNTER — Other Ambulatory Visit: Payer: Self-pay | Admitting: Nurse Practitioner

## 2023-08-18 DIAGNOSIS — Z170 Estrogen receptor positive status [ER+]: Secondary | ICD-10-CM

## 2023-08-18 NOTE — Assessment & Plan Note (Signed)
 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 S/p bilateral mastectomy with reconstruction and adjuvant radiation. (Implants ultimately removed 01/24/19 by Dr. Arelia.) -She started antiestrogen therapy Exemestane  on 04/01/16, planned for 7 years. She experienced joint pain and hot flashes on exemestane , anastrozole , and tamoxifen . She opted to continue anastrozole . Given her continued difficulty with joint pain, as well as her low risk mammaprint, she stopped after she completed 5 years therapy in 03/2021 -she developed low appetite and weight loss in Feb 2025, CT and bone scan showed bone lesions. PET on 06/01/2023 showed diffuse hypermetabolic bone lesions and hypermetabolic small lymph node in the right axillary region and supraclavicular region on the right low neck, concerning for metastatic disease. -[place] node biopsy on 06/22/2023 confirmed metastatic breast cancer, will request NGS  -she started anastrozole  in 05/2023, will add Ribciclib and zometa   - Started Kisqali 2 weeks ago.  Currently on 7 days off Kisqali. Overall, tolerating this very well. Recommend she restart as scheduled 07/23/2023. Start with 2 tablets daily for 3 days then increase to 3 tablets daily.  - 08/19/2023 -patient not taking Kisqali 3 tablets daily.  Takes for 21 days followed by 7 days off.  She is due to start new cycle tomorrow, 08/20/2023.  Will start Zometa  infusions every 6 months, first infusion today.

## 2023-08-18 NOTE — Progress Notes (Unsigned)
 Patient Care Team: [name], FNP as PCP - General (Internal Medicine) Aplington, [name], MD (Inactive) (Orthopedic Surgery) [name], MD (Ophthalmology) [name], MD (Inactive) (Dermatology) [name], MD as Consulting Physician (Hematology) [name], MD as Consulting Physician (General Surgery) [name], MD as Consulting Physician (Radiation Oncology) [name], [name], NP as Nurse Practitioner (Hematology and Oncology) Pickenpack-Cousar, [name], NP as Nurse Practitioner [facility])  Clinic Day:  08/19/2023  Referring physician: [name], FNP  ASSESSMENT & PLAN:   Assessment & Plan: Malignant neoplasm of overlapping sites of right breast in female, estrogen receptor positive (HCC)  mpT2 pN1a, stage IA, G1,  ER positive, PR positive, HER-2 negative, mammaprint low risk luminal type A -Diagnosed in 02/2016. S/p bilateral mastectomy with reconstruction and adjuvant radiation. (Implants ultimately removed 01/24/19 by Dr. Arelia.) -She started antiestrogen therapy Exemestane  on 04/01/16, planned for 7 years. She experienced joint pain and hot flashes on exemestane , anastrozole , and tamoxifen . She opted to continue anastrozole . Given her continued difficulty with joint pain, as well as her low risk mammaprint, she stopped after she completed 5 years therapy in 03/2021 -she developed low appetite and weight loss in Feb 2025, CT and bone scan showed bone lesions. PET on 06/01/2023 showed diffuse hypermetabolic bone lesions and hypermetabolic small lymph node in the right axillary region and supraclavicular region on the right low neck, concerning for metastatic disease. -[place] node biopsy on 06/22/2023 confirmed metastatic breast cancer, will request NGS  -she started anastrozole  in 05/2023, will add Ribciclib and zometa   - Started Kisqali 2 weeks ago.  Currently on 7 days off Kisqali. Overall, tolerating this very well.  Recommend she restart as scheduled 07/23/2023. Start with 2 tablets daily for 3 days then increase to 3 tablets daily.  - 08/19/2023 -patient not taking Kisqali 3 tablets daily.  Takes for 21 days followed by 7 days off.  She is due to start new cycle tomorrow, 08/20/2023.  Will start Zometa  infusions every 6 months, first infusion today.    Fatigue This has been main experienced side effect from taking Kisqali. States that there are many days when she has to log off from work and lay down, taking a nap after she takes the medication. After short nap, she is able to continue with her day. She states that this may also be from difficulty sleeping well. States that she is able to fall asleep ok, but she is waking up around 3 or 4 am and unable to go back to sleep for some time.   Neutropenia WBC is 2.1 with anc 0.5 today. This is likely side effect from Kisqali. She is currently in off part of cycle and will restart medication tomorrow. Will see her back in 1 month to recheck labs and clinical progress.   Constipation The patient admits to having constipation. Is trying to find balance between taking stool softner and specific foods, to keep bowels moving without experiencing diarrhea or constipation. Constipation is probably made more severe due to chronic pain medications due to metastatic cancer.   Hypokalemia Potassium is 3.2.  Increase oral potassium to 20 mEq daily.  New prescription sent to her pharmacy.  Recheck labs in 1 month.  Elevated LFTs Multiple contributing factors including Kisqali, pain medication taken on routine basis which contains Tylenol , and other medications.  Will recheck in 1 month.  Will ultrasound abdomen if they continue to be elevated.  Hypomagnesemia Magnesium level is 1.6.  Recommend oral OTC magnesium  supplement, 200 to 300 mg daily.  Recommend she take in the evening as it can cause sleepiness.  Recheck level in 1 month.  Plan Labs reviewed. - Mild neutropenia  with WBC 2.1 and ANC 0.5. -Hypokalemia with potassium 3.2.  Mild elevation of liver functions with AST 53, ALT 55, and ALKP 66. - Hypomagnesemia with magnesium at 1.6. - CA 27-29 level pending. Patient to start next cycle Kiqali on Friday, 08/20/2023. Continue anastrozole  daily. Repeat labs and follow-up in 1 month.  The patient understands the plans discussed today and is in agreement with them.  She knows to contact our office if she develops concerns prior to her next appointment.  I provided 30 minutes of face-to-face time during this encounter and > 50% was spent counseling as documented under my assessment and plan.    Powell FORBES Lessen, NP  [facility] - A DEPT OF [facility] [address] [place] [address] Dept: [phone] Dept Fax: [phone]   No orders of the defined types were placed in this encounter.     CHIEF COMPLAINT:  CC: Metastatic breast cancer, estrogen receptor positive  Current Treatment: Anastrozole  1 mg daily and Kisqali 600 mg daily for 21 days on and 7 days off.  INTERVAL HISTORY:  Tamkia is here today for repeat clinical assessment.  She has been tolerating addition of Kisqali well.  Due to start Zometa  infusions today. These will be administered every 6 months.  She is now seeing palliative care to help manage chronic pain associated with metastatic cancer.  She reports the biggest side effect she is experiencing is fatigue.  Will sometimes have to have after taking her medication.  After short rest, she is able to get back to normal activities.  She also having trouble sleeping.  She is able to fall asleep without difficulty.  Waking up between 3 and 4 in the morning and unable to fall back to sleep. She denies chest pain, chest pressure, or shortness of breath. She denies headaches or visual disturbances. She denies abdominal pain, nausea, vomiting, or changes in bowel or bladder  habits.  Does have intermittent constipation.  Trying to balance this with stool softeners and diet changes.  She denies fevers or chills. She denies pain. Her appetite is good. Her weight has decreased 4 pounds over last month.  I have reviewed the past medical history, past surgical history, social history and family history with the patient and they are unchanged from previous note.  ALLERGIES:  is allergic to adhesive [tape], promethazine hcl, and sulfa  antibiotics.  MEDICATIONS:  Current Outpatient Medications  Medication Sig Dispense Refill   magic mouthwash (nystatin , lidocaine , diphenhydrAMINE , alum & mag hydroxide) suspension Swish and swallow 5 mLs by mouth 4 (four) times daily as needed for mouth pain. 140 mL 1   ALPRAZolam  (XANAX ) 1 MG tablet Take 1 tablet by mouth 3 (three) times daily.     anastrozole  (ARIMIDEX ) 1 MG tablet Take 1 tablet (1 mg total) by mouth daily. 90 tablet 1   celecoxib  (CELEBREX ) 200 MG capsule Take 1 capsule (200 mg total) by mouth daily. (Patient taking differently: Take 200 mg by mouth as needed.) 30 capsule 2   cloNIDine  (CATAPRES ) 0.1 MG tablet TAKE 1 TABLET BY MOUTH EVERY DAY 90 tablet 1   gabapentin  (NEURONTIN ) 100 MG capsule Take 1 capsule every day by oral route at dinner.     KISQALI, 600 MG  DOSE, 200 MG Therapy Pack TAKE 3 TABLETS BY MOUTH 1 TIME A DAY FOR 21 DAYS ON, 7 DAYS OFF, REPEAT EVERY 28 DAYS. TAKE AS INSTRUCTED PER MD. 63 tablet 0   levothyroxine  (SYNTHROID , LEVOTHROID) 50 MCG tablet Take 50 mcg by mouth daily before breakfast.     omeprazole  (PRILOSEC) 40 MG capsule TAKE 1 CAPSULE BY MOUTH TWICE A DAY 180 capsule 2   ondansetron  (ZOFRAN ) 8 MG tablet Take 1 tablet (8 mg total) by mouth every 4 (four) hours as needed for nausea or vomiting. 135 tablet 1   oxyCODONE -acetaminophen  (PERCOCET/ROXICET) 5-325 MG tablet Take 1 tablet by mouth every 6 (six) hours as needed for severe pain (pain score 7-10). 45 tablet 0   potassium chloride  (KLOR-CON   M) 10 MEQ tablet Take 1 tablet (10 mEq total) by mouth 2 (two) times daily. 180 tablet 1   prochlorperazine  (COMPAZINE ) 10 MG tablet Take 1 tablet (10 mg total) by mouth every 6 (six) hours as needed for nausea or vomiting. 60 tablet 0   valACYclovir  (VALTREX ) 500 MG tablet Take 1 tablet (500 mg total) by mouth 2 (two) times daily. 10 tablet 1   venlafaxine  XR (EFFEXOR -XR) 150 MG 24 hr capsule TAKE 1 CAPSULE BY MOUTH EVERY DAY WITH BREAKFAST 90 capsule 1   venlafaxine  XR (EFFEXOR -XR) 75 MG 24 hr capsule TAKE 1 CAPSULE BY MOUTH DAILY WITH BREAKFAST. 90 capsule 2   zolpidem (AMBIEN) 5 MG tablet Take 1-2 tablets (5-10 mg total) by mouth at bedtime as needed for sleep. 90 tablet 1   No current facility-administered medications for this visit.    HISTORY OF PRESENT ILLNESS:   Oncology History Overview Note  Cancer Staging Malignant neoplasm of overlapping sites of right breast in female, estrogen receptor positive (HCC) Staging form: Breast, AJCC 8th Edition - Clinical stage from 03/23/2016: Stage IIA (cT3, cN1, cM0, G2, ER: Positive, PR: Positive, HER2: Negative) - Signed by Onita Mattock, MD on 03/31/2016 - Pathologic stage from 05/19/2016: Stage IA (pT2(m), pN1a, cM0, G1, ER: Positive, PR: Positive, HER2: Negative) - Signed by Onita Mattock, MD on 06/18/2016     Malignant neoplasm of overlapping sites of right breast in female, estrogen receptor positive (HCC)  03/17/2016 Mammogram   B/l diagnostic mammogram and righ US  showed a 3.6cm irregular mass in the right breast 11:00 position, posterior depth, there is a enlarged lymph node in the right axilla is highly suspicious for malignancy. additional 7 mm oval mass in the right breast lower outer quadrant is suspicious for malignancy.   03/23/2016 Initial Biopsy   Right breast 9:30 position biopsy showed invasive ductal carcinoma, grade 1. 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   03/23/2016 Receptors her2   Both breast and node  biopsy tumor ER 100% positive, PR 70-95% positive, HER-2 negative, Ki-67 40%   03/23/2016 Initial Diagnosis   Malignant neoplasm of upper-outer quadrant of right breast in female, estrogen receptor positive (HCC)   03/25/2016 Initial Biopsy   Right breast 11:00 position core needle biopsy showed invasive duct carcinoma, grade 2.    03/25/2016 Receptors her2   ER 95% positive, PR 90% positive, HER-2 negative, Ki-67 15%   03/25/2016 Miscellaneous   Mammaprint showed low risk type A with index +0.105   03/30/2016 Imaging   Bilateral breast MRI with and without contrast showed a large lobulated enhancing mass within the upper-outer and lower outer right breast with surrounding nodularity, measuring 6.1 x 4.4 x 5.6 cm. Multiple critically sick and  right axillary lymph nodes are demonstrated measuring up to 1.5 cm.    04/01/2016 -  Anti-estrogen oral therapy   Exemestane  25 mg daily, plan for 7 years. Switched to Anastrozole  1mg  in 06/2017 due to joint pain and hot flashes. Due to persistent side effects I changed her to Tamoxifen  in 09/2018. She stopped Tamoxifen  and switched back to anastrozole  in 01/2019 because it was more tolerable.    05/19/2016 Surgery   Bilateral mastectomy and right axillary regional lymph node resection.   05/19/2016 Pathology Results   -Right axillary regional lymph node resection revealed metastatic carcinoma in 2/7 lymph nodes. -Left simple mastectomy revealed lobular neoplasia and fibrocystic changes with adenosis and calcifications. 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 -Skin of the right mastectomy flap was benign. -mpT2, pN1a   06/25/2016 - 08/11/2016 Radiation Therapy   Site/dose:    1. 4 field Right breast was treated to 50.4 Gy in 25 fractions at 1.8 Gy  per fraction. 2. The Right breast was boosted to 10 Gy in 5 fractions at 2 Gy per fraction.   04/15/2017 Genetic Testing   The patient had genetic testing due to a personal history of breast cancer and family history of breast, ovarian, and colon cancer. The Common Hereditary Cancer Panel was ordered.  The Common Hereditary Cancer Panel offered by [facility] includes sequencing and/or deletion duplication testing of the following 47 genes: APC, ATM, AXIN2, BARD1, BMPR1A, BRCA1, BRCA2, BRIP1, CDH1, CDKN2A (p14ARF), CDKN2A (p16INK4a), CKD4, CHEK2, CTNNA1, DICER1, EPCAM (Deletion/duplication testing only), GREM1 (promoter region deletion/duplication testing only), KIT, MEN1, MLH1, MSH2, MSH3, MSH6, MUTYH, NBN, NF1, NHTL1, PALB2, PDGFRA, PMS2, POLD1, POLE, PTEN, RAD50, RAD51C, RAD51D, SDHB, SDHC, SDHD, SMAD4, SMARCA4. STK11, TP53, TSC1, TSC2, and VHL.  The following genes were evaluated for sequence changes only: SDHA and HOXB13 c.251G>A variant only.  Results: Negative, no pathogenic variants identified. The date of this test report is 04/15/2017   06/28/2017 Surgery   COMPLEX REVISION OF BACK SCAR and LEFT BREAST RECONSTRUCTION WITH SILICONE IMPLANT EXCHANGE AND ACELLULARDERMIS TO LEFT CHEST by Dr. Arelia 06/28/17   06/13/2023 Pathology Results   FINAL MICROSCOPIC DIAGNOSIS: A. LYMPH NODE, BIOPSY: -  Metastatic carcinoma morphologically consistent with the patient's known breast primary. ADDENDUM:  An immunohistochemical stain for GATA3 is positive supporting the morphological and clinical impression of breast primary. ADDENDUM: Lymph node, biopsy - Metastatic carcinoma  PROGNOSTIC INDICATORS Results:  IMMUNOHISTOCHEMICAL AND MORPHOMETRIC ANALYSIS PERFORMED MANUALLY  The tumor cells are negative for Her2 (1+). Estrogen Receptor:  70%, positive, strong staining intensity Progesterone Receptor:  80%, positive, strong staining intensity Proliferation Marker Ki67: 20% COMMENT:  The negative hormone  receptor study(ies) in this case has an internal positive control.  REFERENCE RANGE ESTROGEN RECEPTOR        NEGATIVE     0%        POSITIVE       =>1% REFERENCE RANGE PROGESTERONE RECEPTOR        NEGATIVE     0%        POSITIVE        =>1% All controls stained appropriately         REVIEW OF SYSTEMS:   Constitutional: Denies fevers, chills or abnormal weight loss. Improved appetite.  Eyes: Denies blurriness of vision Ears, nose, mouth, throat, and face: Denies mucositis or sore throat Respiratory:  Denies cough, dyspnea or wheezes Cardiovascular: Denies palpitation, chest discomfort or lower extremity swelling Gastrointestinal:  Denies nausea, heartburn or change in bowel habits. Intermittent constipation.  Skin: Denies abnormal skin rashes Lymphatics: Denies new lymphadenopathy or easy bruising Neurological:Denies numbness, tingling or new weaknesses Behavioral/Psych: Mood is stable, no new changes. Difficulty sleeping for normal period of time. Is waking between 3 and 4 am and is unable to go back to sleep.  All other systems were reviewed with the patient and are negative.   VITALS:   Today's Vitals   08/19/23 0908  BP: 138/88  Pulse: 86  Resp: 17  Temp: (!) 97.5 F (36.4 C)  SpO2: 99%  Weight: 119 lb 11.2 oz (54.3 kg)   Body mass index is 19.92 kg/m.   Wt Readings from Last 3 Encounters:  08/19/23 119 lb 11.2 oz (54.3 kg)  07/22/23 123 lb 1.6 oz (55.8 kg)  07/19/23 124 lb 12.5 oz (56.6 kg)    Body mass index is 19.92 kg/m.  Performance status (ECOG): 1 - Symptomatic but completely ambulatory  PHYSICAL EXAM:   GENERAL:alert, no distress and comfortable SKIN: skin color, texture, turgor are normal, no rashes or significant lesions EYES: normal, Conjunctiva are pink and non-injected, sclera clear OROPHARYNX:no exudate, no erythema and lips, buccal mucosa, and tongue normal  NECK: supple, thyroid  normal size, non-tender, without nodularity LYMPH:  no  palpable lymphadenopathy in the cervical, axillary or inguinal LUNGS: clear to auscultation and percussion with normal breathing effort HEART: regular rate & rhythm and no murmurs and no lower extremity edema ABDOMEN:abdomen soft, non-tender and normal bowel sounds Musculoskeletal:no cyanosis of digits and no clubbing  NEURO: alert & oriented x 3 with fluent speech, no focal motor/sensory deficits  LABORATORY DATA:  I have reviewed the data as listed    Component Value Date/Time   NA 142 08/19/2023 0829   NA 141 02/19/2017 1304   K 3.2 (L) 08/19/2023 0829   K 4.0 02/19/2017 1304   CL 102 08/19/2023 0829   CO2 30 08/19/2023 0829   CO2 26 02/19/2017 1304   GLUCOSE 100 (H) 08/19/2023 0829   GLUCOSE 99 02/19/2017 1304   BUN 7 (L) 08/19/2023 0829   BUN 16.4 02/19/2017 1304   CREATININE 0.91 08/19/2023 0829   CREATININE 0.9 02/19/2017 1304   CALCIUM 9.1 08/19/2023 0829   CALCIUM 8.8 02/19/2017 1304   PROT 6.5 08/19/2023 0829   PROT 6.2 (L) 02/19/2017 1304   ALBUMIN  4.3 08/19/2023 0829   ALBUMIN  3.6 02/19/2017 1304   AST 53 (H) 08/19/2023 0829   AST 22 02/19/2017 1304   ALT 55 (H) 08/19/2023 0829   ALT 23 02/19/2017 1304   ALKPHOS 66 08/19/2023 0829   ALKPHOS 88 02/19/2017 1304   BILITOT 0.6 08/19/2023 0829   BILITOT 0.39 02/19/2017 1304   GFRNONAA >60 08/19/2023 0829   GFRNONAA 72 11/06/2015 0831   GFRAA >60 10/12/2019 1445   GFRAA 83 11/06/2015 0831    Lab Results  Component Value Date   WBC 2.1 (L) 08/19/2023   NEUTROABS 0.5 (L) 08/19/2023   HGB 14.0 08/19/2023   HCT 40.6 08/19/2023   MCV 84.8 08/19/2023   PLT 214 08/19/2023

## 2023-08-19 ENCOUNTER — Inpatient Hospital Stay (HOSPITAL_BASED_OUTPATIENT_CLINIC_OR_DEPARTMENT_OTHER): Admitting: Nurse Practitioner

## 2023-08-19 ENCOUNTER — Inpatient Hospital Stay

## 2023-08-19 ENCOUNTER — Inpatient Hospital Stay: Attending: Nurse Practitioner | Admitting: Nurse Practitioner

## 2023-08-19 ENCOUNTER — Encounter: Payer: Self-pay | Admitting: Nurse Practitioner

## 2023-08-19 ENCOUNTER — Inpatient Hospital Stay: Admitting: Licensed Clinical Social Worker

## 2023-08-19 VITALS — BP 138/88 | HR 86 | Temp 97.5°F | Resp 17 | Wt 119.7 lb

## 2023-08-19 DIAGNOSIS — Z170 Estrogen receptor positive status [ER+]: Secondary | ICD-10-CM

## 2023-08-19 DIAGNOSIS — C50811 Malignant neoplasm of overlapping sites of right female breast: Secondary | ICD-10-CM

## 2023-08-19 DIAGNOSIS — Z1732 Human epidermal growth factor receptor 2 negative status: Secondary | ICD-10-CM | POA: Insufficient documentation

## 2023-08-19 DIAGNOSIS — Z79811 Long term (current) use of aromatase inhibitors: Secondary | ICD-10-CM | POA: Diagnosis not present

## 2023-08-19 DIAGNOSIS — C50411 Malignant neoplasm of upper-outer quadrant of right female breast: Secondary | ICD-10-CM | POA: Insufficient documentation

## 2023-08-19 DIAGNOSIS — C773 Secondary and unspecified malignant neoplasm of axilla and upper limb lymph nodes: Secondary | ICD-10-CM | POA: Diagnosis not present

## 2023-08-19 DIAGNOSIS — E876 Hypokalemia: Secondary | ICD-10-CM | POA: Diagnosis not present

## 2023-08-19 DIAGNOSIS — R630 Anorexia: Secondary | ICD-10-CM

## 2023-08-19 DIAGNOSIS — C7951 Secondary malignant neoplasm of bone: Secondary | ICD-10-CM | POA: Insufficient documentation

## 2023-08-19 DIAGNOSIS — R978 Other abnormal tumor markers: Secondary | ICD-10-CM | POA: Insufficient documentation

## 2023-08-19 DIAGNOSIS — Z515 Encounter for palliative care: Secondary | ICD-10-CM

## 2023-08-19 DIAGNOSIS — M816 Localized osteoporosis [Lequesne]: Secondary | ICD-10-CM

## 2023-08-19 DIAGNOSIS — Z1721 Progesterone receptor positive status: Secondary | ICD-10-CM | POA: Diagnosis not present

## 2023-08-19 DIAGNOSIS — K5903 Drug induced constipation: Secondary | ICD-10-CM

## 2023-08-19 DIAGNOSIS — G893 Neoplasm related pain (acute) (chronic): Secondary | ICD-10-CM | POA: Diagnosis not present

## 2023-08-19 DIAGNOSIS — D709 Neutropenia, unspecified: Secondary | ICD-10-CM | POA: Insufficient documentation

## 2023-08-19 LAB — CBC WITH DIFFERENTIAL (CANCER CENTER ONLY)
Abs Immature Granulocytes: 0.01 10*3/uL (ref 0.00–0.07)
Basophils Absolute: 0.1 10*3/uL (ref 0.0–0.1)
Basophils Relative: 3 %
Eosinophils Absolute: 0.1 10*3/uL (ref 0.0–0.5)
Eosinophils Relative: 5 %
HCT: 40.6 % (ref 36.0–46.0)
Hemoglobin: 14 g/dL (ref 12.0–15.0)
Immature Granulocytes: 1 %
Lymphocytes Relative: 57 %
Lymphs Abs: 1.2 10*3/uL (ref 0.7–4.0)
MCH: 29.2 pg (ref 26.0–34.0)
MCHC: 34.5 g/dL (ref 30.0–36.0)
MCV: 84.8 fL (ref 80.0–100.0)
Monocytes Absolute: 0.2 10*3/uL (ref 0.1–1.0)
Monocytes Relative: 10 %
Neutro Abs: 0.5 10*3/uL — ABNORMAL LOW (ref 1.7–7.7)
Neutrophils Relative %: 24 %
Platelet Count: 214 10*3/uL (ref 150–400)
RBC: 4.79 MIL/uL (ref 3.87–5.11)
RDW: 21.9 % — ABNORMAL HIGH (ref 11.5–15.5)
WBC Count: 2.1 10*3/uL — ABNORMAL LOW (ref 4.0–10.5)
nRBC: 0 % (ref 0.0–0.2)

## 2023-08-19 LAB — CMP (CANCER CENTER ONLY)
ALT: 55 U/L — ABNORMAL HIGH (ref 0–44)
AST: 53 U/L — ABNORMAL HIGH (ref 15–41)
Albumin: 4.3 g/dL (ref 3.5–5.0)
Alkaline Phosphatase: 66 U/L (ref 38–126)
Anion gap: 10 (ref 5–15)
BUN: 7 mg/dL — ABNORMAL LOW (ref 8–23)
CO2: 30 mmol/L (ref 22–32)
Calcium: 9.1 mg/dL (ref 8.9–10.3)
Chloride: 102 mmol/L (ref 98–111)
Creatinine: 0.91 mg/dL (ref 0.44–1.00)
GFR, Estimated: >60 mL/min (ref >60)
Glucose, Bld: 100 mg/dL — ABNORMAL HIGH (ref 70–99)
Potassium: 3.2 mmol/L — ABNORMAL LOW (ref 3.5–5.1)
Sodium: 142 mmol/L (ref 135–145)
Total Bilirubin: 0.6 mg/dL (ref 0.0–1.2)
Total Protein: 6.5 g/dL (ref 6.5–8.1)

## 2023-08-19 LAB — MAGNESIUM: Magnesium: 1.6 mg/dL — ABNORMAL LOW (ref 1.7–2.4)

## 2023-08-19 LAB — PHOSPHORUS: Phosphorus: 4 mg/dL (ref 2.5–4.6)

## 2023-08-19 MED ORDER — ZOLPIDEM TARTRATE 5 MG PO TABS: 5.0000 mg | ORAL_TABLET | Freq: Every evening | ORAL | 1 refills | Status: DC | PRN

## 2023-08-19 MED ORDER — POTASSIUM CHLORIDE CRYS ER 10 MEQ PO TBCR: 10.0000 meq | EXTENDED_RELEASE_TABLET | Freq: Two times a day (BID) | ORAL | 1 refills | Status: DC

## 2023-08-19 MED ORDER — OXYCODONE-ACETAMINOPHEN 5-325 MG PO TABS: 1.0000 | ORAL_TABLET | Freq: Four times a day (QID) | ORAL | 0 refills | Status: DC | PRN

## 2023-08-19 MED ORDER — PROCHLORPERAZINE MALEATE 10 MG PO TABS: 10.0000 mg | ORAL_TABLET | Freq: Four times a day (QID) | ORAL | 0 refills | Status: DC | PRN

## 2023-08-19 MED ORDER — ZOLEDRONIC ACID 4 MG/100ML IV SOLN
4.0000 mg | Freq: Once | INTRAVENOUS | Status: AC
  Administered 2023-08-19: 4 mg via INTRAVENOUS
  Filled 2023-08-19: qty 100

## 2023-08-19 NOTE — Patient Instructions (Signed)

## 2023-08-19 NOTE — Progress Notes (Signed)
 Palliative Medicine [facility]  [phone] Fax:([phone]   Name: [name] Date: 08/19/2023 MRN: [id number]  DOB: 1961/01/14  Patient Care Team: [name], FNP as PCP - General (Internal Medicine) Aplington, [name], MD (Inactive) (Orthopedic Surgery) [name], MD (Ophthalmology) [name], MD (Inactive) (Dermatology) [name], MD as Consulting Physician (Hematology) [name], MD as Consulting Physician (General Surgery) [name], MD as Consulting Physician (Radiation Oncology) [name], [name], NP as Nurse Practitioner (Hematology and Oncology) Pickenpack-Cousar, [name], NP as Nurse Practitioner (Hospice and Palliative Medicine)    REASON FOR CONSULTATION: [name] is a 63 y.o. female with oncologic medical history including metastatic breast cancer with diffuse bone lesions, currently on anastrozole , Zometa , and Kisqali .  Palliative is seeing patient for symptom management and goals of care.    SOCIAL HISTORY:     reports that she quit smoking about 7 years ago. Her smoking use included cigarettes. She started smoking about 22 years ago. She has a 7.5 pack-year smoking history. She has never used smokeless tobacco. She reports current alcohol use of about 14.0 standard drinks of alcohol per week. She reports that she does not use drugs.  ADVANCE DIRECTIVES:  None on file   CODE STATUS: Full code  PAST MEDICAL HISTORY: Past Medical History:  Diagnosis Date   Anxiety    Anxiety disorder    Asthma    h/o asthma as a child   Breast cancer (HCC)    Cancer (HCC) 04/2016   right breast cancer   Cholelithiasis    Chronic kidney disease    obstruction of R kidney, ( not a stone) - currently resolved    Depression    Diverticulosis    DJD (degenerative joint disease)    hands & back    Dyspnea    resolved since she stopped smoking    Epigastric abdominal pain    Esophageal  stricture    Family history of adverse reaction to anesthesia    daughter has N&V, takes long time to wake up    Family history of colon cancer    Family history of ovarian cancer    Family hx of colon cancer    Female pelvic peritoneal adhesions 10/26/2012   Ganglion cyst    GERD (gastroesophageal reflux disease)    Headache    low grade currently , family history of migraines    Hemorrhoid    History of radiation therapy 05/03 - 08/11/2016   1. 4 field Right breast was treated to 50.4 Gy in 25 fractions at 1.8 Gy per fraction. 2. The Right breast was boosted to 10 Gy in 5 fractions at 2 Gy per fraction.   Hypertension    Hypothyroidism    Plantar fasciitis, bilateral    PONV (postoperative nausea and vomiting)    gets anxious with the mask on her face, also remarks that the scop. patch has helped in the past       PAST SURGICAL HISTORY:  Past Surgical History:  Procedure Laterality Date   ABDOMINAL HYSTERECTOMY     BREAST IMPLANT REMOVAL Bilateral 01/24/2019   Procedure: REMOVAL BREAST IMPLANTS;  Surgeon: Arelia Filippo, MD;  Location: [facility];  Service: Plastics;  Laterality: Bilateral;   BREAST RECONSTRUCTION WITH PLACEMENT OF TISSUE EXPANDER AND FLEX HD (ACELLULAR HYDRATED DERMIS) Bilateral 05/19/2016   Procedure: BREAST RECONSTRUCTION WITH PLACEMENT OF TISSUE EXPANDER AND ALLODERM PLACEMENT;  Surgeon: Filippo Arelia, MD;  Location: [facility];  Service: Plastics;  Laterality: Bilateral;   BREAST RECONSTRUCTION WITH PLACEMENT OF TISSUE EXPANDER AND FLEX HD (ACELLULAR HYDRATED DERMIS) Left 06/28/2017   Procedure: LEFT BREAST RECONSTRUCTION WITH SILICONE IMPLANT EXCHANGE AND ACELLULARDERMIS TO LEFT CHEST;  Surgeon: Arelia Filippo, MD;  Location: [facility];  Service: Plastics;  Laterality: Left;   CAPSULECTOMY Bilateral 01/24/2019   Procedure: CAPSULECTOMY;  Surgeon: Arelia Filippo, MD;  Location: [facility];   Service: Plastics;  Laterality: Bilateral;   CESAREAN SECTION  1988   CHOLECYSTECTOMY     HEMORRHOID SURGERY     LAPAROSCOPIC BILATERAL SALPINGECTOMY N/A 10/26/2012   Procedure: operative laparoscopy with lysis of adhesions;  Surgeon: Ezzie Marshall, MD;  Location: [facility];  Service: Gynecology;  Laterality: N/A;   LESION EXCISION WITH COMPLEX REPAIR Right 06/09/2019   Procedure: COMPLEX REPAIR RIGHT CHEST 15CM;  Surgeon: Arelia Filippo, MD;  Location: [facility];  Service: Plastics;  Laterality: Right;   MASTECTOMY Bilateral    MASTECTOMY WITH RADIOACTIVE SEED GUIDED EXCISION AND AXILLARY SENTINEL LYMPH NODE BIOPSY Bilateral 05/19/2016   Procedure: RIGHT SKIN SPARING MASTECTOMY WITH RIGHT RADIOACTIVE SEED TARGETED DISSECTION AND RIGHT SENTINEL LYMPH NODE BIOPSY, LEFT PROPHYLACTIC SKIN SPARING MASTECTOMY;  Surgeon: Donnice Bury, MD;  Location: [facility];  Service: General;  Laterality: Bilateral;   REMOVAL OF BILATERAL TISSUE EXPANDERS WITH PLACEMENT OF BILATERAL BREAST IMPLANTS Bilateral 01/29/2017   Procedure: REMOVAL OF BILATERAL TISSUE EXPANDERS WITH PLACEMENT OF BILATERAL SILICONE BREAST IMPLANTS, ALLODERM TO LEFT BREAST RECONSTRUCTION;  RIGHT LATISSUMUS FLAP;  Surgeon: Arelia Filippo, MD;  Location: [facility];  Service: Plastics;  Laterality: Bilateral;  Requesting RNFA   SCAR REVISION Right 06/28/2017   Procedure: COMPLEX REVISION OF BACK SCAR;  Surgeon: Arelia Filippo, MD;  Location: [facility];  Service: Plastics;  Laterality: Right;   TONSILLECTOMY     TUBAL LIGATION     urologic surgery for ureteropelvic junction obstruction      HEMATOLOGY/ONCOLOGY HISTORY:  Oncology History Overview Note  Cancer Staging Malignant neoplasm of overlapping sites of right breast in female, estrogen receptor positive (HCC) Staging form: Breast, AJCC 8th Edition - Clinical stage from 03/23/2016: Stage IIA (cT3, cN1, cM0, G2, ER: Positive, PR: Positive, HER2:  Negative) - Signed by Onita Mattock, MD on 03/31/2016 - Pathologic stage from 05/19/2016: Stage IA (pT2(m), pN1a, cM0, G1, ER: Positive, PR: Positive, HER2: Negative) - Signed by Onita Mattock, MD on 06/18/2016     Malignant neoplasm of overlapping sites of right breast in female, estrogen receptor positive (HCC)  03/17/2016 Mammogram   B/l diagnostic mammogram and righ US  showed a 3.6cm irregular mass in the right breast 11:00 position, posterior depth, there is a enlarged lymph node in the right axilla is highly suspicious for malignancy. additional 7 mm oval mass in the right breast lower outer quadrant is suspicious for malignancy.   03/23/2016 Initial Biopsy   Right breast 9:30 position biopsy showed invasive ductal carcinoma, grade 1. 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   03/23/2016 Receptors her2   Both breast and node biopsy tumor ER 100% positive, PR 70-95% positive, HER-2 negative, Ki-67 40%   03/23/2016 Initial Diagnosis   Malignant neoplasm of upper-outer quadrant of right breast in female, estrogen receptor positive (HCC)   03/25/2016 Initial Biopsy   Right breast 11:00 position core needle biopsy showed invasive duct carcinoma, grade 2.    03/25/2016 Receptors her2   ER 95% positive, PR  90% positive, HER-2 negative, Ki-67 15%   03/25/2016 Miscellaneous   Mammaprint showed low risk type A with index +0.105   03/30/2016 Imaging   Bilateral breast MRI with and without contrast showed a large lobulated enhancing mass within the upper-outer and lower outer right breast with surrounding nodularity, measuring 6.1 x 4.4 x 5.6 cm. Multiple critically sick and right axillary lymph nodes are demonstrated measuring up to 1.5 cm.    04/01/2016 -  Anti-estrogen oral therapy   Exemestane  25 mg daily, plan for 7 years. Switched to Anastrozole  1mg  in 06/2017 due to joint pain and hot flashes. Due to persistent side effects I changed her to Tamoxifen  in 09/2018. She stopped  Tamoxifen  and switched back to anastrozole  in 01/2019 because it was more tolerable.    05/19/2016 Surgery   Bilateral mastectomy and right axillary regional lymph node resection.   05/19/2016 Pathology Results   -Right axillary regional lymph node resection revealed metastatic carcinoma in 2/7 lymph nodes. -Left simple mastectomy revealed lobular neoplasia and fibrocystic changes with adenosis and calcifications. 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 -Skin of the right mastectomy flap was benign. -mpT2, pN1a   06/25/2016 - 08/11/2016 Radiation Therapy   Site/dose:    1. 4 field Right breast was treated to 50.4 Gy in 25 fractions at 1.8 Gy per fraction. 2. The Right breast was boosted to 10 Gy in 5 fractions at 2 Gy per fraction.   04/15/2017 Genetic Testing   The patient had genetic testing due to a personal history of breast cancer and family history of breast, ovarian, and colon cancer. The Common Hereditary Cancer Panel was ordered.  The Common Hereditary Cancer Panel offered by [facility] includes sequencing and/or deletion duplication testing of the following 47 genes: APC, ATM, AXIN2, BARD1, BMPR1A, BRCA1, BRCA2, BRIP1, CDH1, CDKN2A (p14ARF), CDKN2A (p16INK4a), CKD4, CHEK2, CTNNA1, DICER1, EPCAM (Deletion/duplication testing only), GREM1 (promoter region deletion/duplication testing only), KIT, MEN1, MLH1, MSH2, MSH3, MSH6, MUTYH, NBN, NF1, NHTL1, PALB2, PDGFRA, PMS2, POLD1, POLE, PTEN, RAD50, RAD51C, RAD51D, SDHB, SDHC, SDHD, SMAD4, SMARCA4. STK11, TP53, TSC1, TSC2, and VHL.  The following genes were evaluated for sequence changes only: SDHA and HOXB13 c.251G>A variant only.  Results: Negative, no pathogenic variants identified. The date of this test report is 04/15/2017   06/28/2017  Surgery   COMPLEX REVISION OF BACK SCAR and LEFT BREAST RECONSTRUCTION WITH SILICONE IMPLANT EXCHANGE AND ACELLULARDERMIS TO LEFT CHEST by Dr. Arelia 06/28/17   06/13/2023 Pathology Results   FINAL MICROSCOPIC DIAGNOSIS: A. LYMPH NODE, BIOPSY: -  Metastatic carcinoma morphologically consistent with the patient's known breast primary. ADDENDUM:  An immunohistochemical stain for GATA3 is positive supporting the morphological and clinical impression of breast primary. ADDENDUM: Lymph node, biopsy - Metastatic carcinoma  PROGNOSTIC INDICATORS Results:  IMMUNOHISTOCHEMICAL AND MORPHOMETRIC ANALYSIS PERFORMED MANUALLY  The tumor cells are negative for Her2 (1+). Estrogen Receptor:  70%, positive, strong staining intensity Progesterone Receptor:  80%, positive, strong staining intensity Proliferation Marker Ki67: 20% COMMENT:  The negative hormone receptor study(ies) in this case has an internal positive control.  REFERENCE RANGE ESTROGEN RECEPTOR        NEGATIVE     0%        POSITIVE       =>1% REFERENCE RANGE PROGESTERONE RECEPTOR        NEGATIVE  0%        POSITIVE        =>1% All controls stained appropriately       ALLERGIES:  is allergic to adhesive [tape], promethazine hcl, and sulfa  antibiotics.  MEDICATIONS:  Current Outpatient Medications  Medication Sig Dispense Refill   magic mouthwash (nystatin , lidocaine , diphenhydrAMINE , alum & mag hydroxide) suspension Swish and swallow 5 mLs by mouth 4 (four) times daily as needed for mouth pain. 140 mL 1   zolpidem (AMBIEN) 5 MG tablet Take 1-2 tablets (5-10 mg total) by mouth at bedtime as needed for sleep. 90 tablet 1   ALPRAZolam  (XANAX ) 1 MG tablet Take 1 tablet by mouth 3 (three) times daily.     anastrozole  (ARIMIDEX ) 1 MG tablet Take 1 tablet (1 mg total) by mouth daily. 90 tablet 1   celecoxib  (CELEBREX ) 200 MG capsule Take 1 capsule (200 mg total) by mouth daily. (Patient taking differently: Take 200 mg by mouth as  needed.) 30 capsule 2   cloNIDine  (CATAPRES ) 0.1 MG tablet TAKE 1 TABLET BY MOUTH EVERY DAY 90 tablet 1   gabapentin  (NEURONTIN ) 100 MG capsule Take 1 capsule every day by oral route at dinner.     KISQALI, 600 MG DOSE, 200 MG Therapy Pack TAKE 3 TABLETS BY MOUTH 1 TIME A DAY FOR 21 DAYS ON, 7 DAYS OFF, REPEAT EVERY 28 DAYS. TAKE AS INSTRUCTED PER MD. 63 tablet 0   levothyroxine  (SYNTHROID , LEVOTHROID) 50 MCG tablet Take 50 mcg by mouth daily before breakfast.     omeprazole  (PRILOSEC) 40 MG capsule TAKE 1 CAPSULE BY MOUTH TWICE A DAY 180 capsule 2   ondansetron  (ZOFRAN ) 8 MG tablet Take 1 tablet (8 mg total) by mouth every 4 (four) hours as needed for nausea or vomiting. 135 tablet 1   oxyCODONE -acetaminophen  (PERCOCET/ROXICET) 5-325 MG tablet Take 1 tablet by mouth every 6 (six) hours as needed for severe pain (pain score 7-10). 45 tablet 0   potassium chloride  (KLOR-CON  M) 10 MEQ tablet Take 1 tablet (10 mEq total) by mouth 2 (two) times daily. 180 tablet 1   prochlorperazine  (COMPAZINE ) 10 MG tablet Take 1 tablet (10 mg total) by mouth every 6 (six) hours as needed for nausea or vomiting. 60 tablet 0   valACYclovir  (VALTREX ) 500 MG tablet Take 1 tablet (500 mg total) by mouth 2 (two) times daily. 10 tablet 1   venlafaxine  XR (EFFEXOR -XR) 150 MG 24 hr capsule TAKE 1 CAPSULE BY MOUTH EVERY DAY WITH BREAKFAST 90 capsule 1   venlafaxine  XR (EFFEXOR -XR) 75 MG 24 hr capsule TAKE 1 CAPSULE BY MOUTH DAILY WITH BREAKFAST. 90 capsule 2   No current facility-administered medications for this visit.    VITAL SIGNS: There were no vitals taken for this visit. There were no vitals filed for this visit.  Estimated body mass index is 19.92 kg/m as calculated from the following:   Height as of 07/19/23: 5' 5 (1.651 m).   Weight as of an earlier encounter on 08/19/23: 119 lb 11.2 oz (54.3 kg).  LABS: CBC:    Component Value Date/Time   WBC 2.1 (L) 08/19/2023 0829   WBC 3.1 (L) 07/19/2023 1519   HGB  14.0 08/19/2023 0829   HGB 11.2 (L) 02/19/2017 1304   HCT 40.6 08/19/2023 0829   HCT 34.9 02/19/2017 1304   PLT 214 08/19/2023 0829   PLT 302 02/19/2017 1304   MCV 84.8 08/19/2023 0829   MCV 84.2 02/19/2017 1304   NEUTROABS 0.5 (  L) 08/19/2023 0829   NEUTROABS 2.6 02/19/2017 1304   LYMPHSABS 1.2 08/19/2023 0829   LYMPHSABS 1.4 02/19/2017 1304   MONOABS 0.2 08/19/2023 0829   MONOABS 0.5 02/19/2017 1304   EOSABS 0.1 08/19/2023 0829   EOSABS 0.3 02/19/2017 1304   BASOSABS 0.1 08/19/2023 0829   BASOSABS 0.1 02/19/2017 1304   Comprehensive Metabolic Panel:    Component Value Date/Time   NA 142 08/19/2023 0829   NA 141 02/19/2017 1304   K 3.2 (L) 08/19/2023 0829   K 4.0 02/19/2017 1304   CL 102 08/19/2023 0829   CO2 30 08/19/2023 0829   CO2 26 02/19/2017 1304   BUN 7 (L) 08/19/2023 0829   BUN 16.4 02/19/2017 1304   CREATININE 0.91 08/19/2023 0829   CREATININE 0.9 02/19/2017 1304   GLUCOSE 100 (H) 08/19/2023 0829   GLUCOSE 99 02/19/2017 1304   CALCIUM 9.1 08/19/2023 0829   CALCIUM 8.8 02/19/2017 1304   AST 53 (H) 08/19/2023 0829   AST 22 02/19/2017 1304   ALT 55 (H) 08/19/2023 0829   ALT 23 02/19/2017 1304   ALKPHOS 66 08/19/2023 0829   ALKPHOS 88 02/19/2017 1304   BILITOT 0.6 08/19/2023 0829   BILITOT 0.39 02/19/2017 1304   PROT 6.5 08/19/2023 0829   PROT 6.2 (L) 02/19/2017 1304   ALBUMIN  4.3 08/19/2023 0829   ALBUMIN  3.6 02/19/2017 1304    RADIOGRAPHIC STUDIES: No results found.  PERFORMANCE STATUS (ECOG) : 1 - Symptomatic but completely ambulatory  Review of Systems  Constitutional:  Positive for appetite change and unexpected weight change.  Musculoskeletal:  Positive for back pain.       Sacral pain   Unless otherwise noted, a complete review of systems is negative.  Physical Exam General: NAD Cardiovascular: regular rate and rhythm Pulmonary: clear ant fields Abdomen: soft, nontender, + bowel sounds Extremities: no edema, no joint deformities Skin: no  rashes Neurological: Alert and oriented x3  IMPRESSION: Discussed the use of AI scribe software for clinical note transcription with the patient, who gave verbal consent to proceed.  History of Present Illness Novalynn Branaman is a 63 year old female with metastatic breast cancer who presents to clinic today for her initial palliative visit. No acute distress noted. She is accompanied by her husband, Eulah. Patient is alert and able to engage in discussions appropriately.   I introduced myself, Maygan RN, and Palliative's role in collaboration with the oncology team. Concept of Palliative Care was introduced as specialized medical care for people and their families living with serious illness.  It focuses on providing relief from the symptoms and stress of a serious illness.  The goal is to improve quality of life for both the patient and the family. Values and goals of care important to patient and family were attempted to be elicited.   Mrs. Shives lives in the home with her husband of more than 44 years. They have 2 daughters and 4 grandchildren. She works from home in the [employer].   She is able to perform most ADLs with some limitations due to pain and discomfort. Ambulatory without assistive devices.   She experiences significant nausea during chemotherapy treatments despite taking anti-nausea medications. She alternates between Zofran  and Compazine , noting that Compazine  provides better relief. Nausea is primarily present during treatment weeks and not during off weeks. Encouraged ongoing use of antiemetics as needed.   She has experienced significant weight loss, dropping from 136 pounds in March to 119 pounds currently.  She attributes some of this to chemotherapy affecting her appetite and causing nausea. She is concerned about muscle loss and has a history of thyroid  issues, which she suspects may be contributing to her weight loss. We discussed focusing on increasing  nutrition. Would recommend focusing on small frequent meals versus large meals. Hopeful as nausea improves around treatment this will allow for increased appetite. We will continue to closely monitor and support. Will send in Dietician referral if weight continues to decline. Patient would like to follow-up on thyroid  for now.   Mrs. Mctighe experiences constipation, which she manages with stool softeners like Colace. No diarrhea reported. Recommended every other day use of Miralax  if no improvement increase to once daily. She verbalized understanding.   We discussed her pain at length. Pain is primarily in her tailbone, described as sharp and throbbing, exacerbated by prolonged sitting. She has arthritis in her back and experiences increased pain after physical activity. She uses oxycodone , gabapentin , and Celebrex  as needed for pain relief, noting significant relief when taken. Patient reports she often has to shift weight or adjust activity level when pain intensifies. Also increase in pain with weather changes such as rain. Patient reports pain is well managed on current regimen. She is reluctant to take her oxycodone  as often as she possibly needs to due to fear of increased constipation.  The patient provided on the importance of bowel regimen to include the MiraLAX  to prevent constipation.  Discussed with patient given extensive diffuse bone mets she is likely to have ongoing pain and although she is limiting herself to provide comfort and improvement in her quality of life consider taking as needed.  She has been taking Ambien for about a year to aid sleep but feels it is no longer providing a full night's rest. She reports waking up early mornings with difficulty returning back to sleep.  Instructed patient to increase Ambien to 1.5-2 tablets (5/10 mg) at bedtime as needed.  All questions answered and support provided.  Goals of care We discussed her current illness and what it means in the larger  context of her on-going co-morbidities. Natural disease trajectory and expectations were discussed.  Patient and husband are realistic and understanding of her current cancer and plan of care.  Patient is clear in her expressed wishes to continue to treat the treatable allow her every opportunity to continue to thrive.  Wishes to manage her symptoms to minimize any suffering or discomfort.  Her quality of life is most important to her.  Patient does not have an advance directive on file.  Would encourage completion.  Full code.  I discussed the importance of continued conversation with family and their medical providers regarding overall plan of care and treatment options, ensuring decisions are within the context of the patients values and GOCs.  Established therapeutic relationship. Education provided on palliative's role in collaboration with their Oncology/Radiation team.  Assessment & Plan Nausea due to chemotherapy Nausea occurs during chemotherapy weeks. Compazine  more effective than Zofran . - Prescribe Compazine  every six hours as needed during treatment weeks. - Advise taking Compazine  the day before and during treatment week as needed. - Use Zofran  between Compazine  doses if needed. - Send new prescription for Compazine  to pharmacy.  Pain in tailbone and back Cancer related pain managed with oxycodone , gabapentin , and Celebrex . Pain severe after activity. - Continue use of oxycodone , gabapentin , and Celebrex  as needed. -Continue Percocet 1 tablet every 6 hours continue gabapentin  100 mg daily - Continue Celebrex   200 mg daily as needed - Advise contacting provider 2-3 days before running out of pain medication.  Constipation Constipation likely due to pain medication. Colace causes excessive bowel movements. - Recommend Miralax  every other day, adjust to daily if needed. - Provide information on discounted Miralax  from [facility] outpatient pharmacy. - Check for Miralax  samples  in office.  Weight loss Significant weight loss possibly due to chemotherapy and thyroid  issues. Poor appetite during chemotherapy. - Discuss potential thyroid  evaluation with Dr. Prevost if concerned. - Dietitian referral if weight continues to decline.  Insomnia Difficulty sleeping, possibly due to insufficient Ambien dose. - Increase Ambien dose to 1.5 tablets at night, option to increase to 2 tablets if needed. - Refill Ambien prescription with instructions to adjust as needed.  Goals of care Patient is clear and expressed wishes to continue to treat the treatable allow her every opportunity to continue to thrive while managing symptoms to decrease pain and discomfort. - No advanced directives on file - Full code  I will plan to see patient back in 2-3 weeks.  Sooner if needed.   Patient expressed understanding and was in agreement with this plan. She also understands that She can call the clinic at any time with any questions, concerns, or complaints.   Thank you for your referral and allowing Palliative to assist in Mrs. Adaline Trejos [facility] care.   Number and complexity of problems addressed: HIGH - 1 or more chronic illnesses with SEVERE exacerbation, progression, or side effects of treatment - advanced cancer, pain. Any controlled substances utilized were prescribed in the context of palliative care.  Visit consisted of counseling and education dealing with the complex and emotionally intense issues of symptom management and palliative care in the setting of serious and potentially life-threatening illness.  Signed by: Levon Borer, AGPCNP-BC Palliative Medicine Team/[facility]

## 2023-08-19 NOTE — Progress Notes (Signed)
 [facility] CSW Progress Note  Visual merchandiser met with patient in infusion to follow-up on referral for emotional support.    Interventions: CSW met w/ pt and spouse to introduce self and supportive services.  Pt recently found to have metastatic recurrence and has started a new treatment regiment.  Pt reports she has struggled w/ tolerating chemo which she is hopeful she will be able to better manage.  Pt connected to palliative care for symptom management support.  Pt continues to work and recently was able to be set up at home which gives her schedule some flexibility.  Per pt she has very good support from family and friends and a strong spiritual connection.  CSW provided pt w/ contact details for counseling should she feel she may benefit from additional support.  CSW also informed pt of the breast cancer support group.      Follow Up Plan:  Patient will contact CSW with any support or resource needs    Devere JONELLE Manna, LCSW Clinical Social Worker [facility]

## 2023-08-20 ENCOUNTER — Telehealth: Payer: Self-pay | Admitting: Nurse Practitioner

## 2023-08-20 LAB — CANCER ANTIGEN 27.29: CA 27.29: 392.4 U/mL — ABNORMAL HIGH (ref 0.0–38.6)

## 2023-08-20 NOTE — Telephone Encounter (Signed)
 Scheduled appointments per 6/26 los. Talked with the patient and she is aware of the made appointments.

## 2023-08-24 ENCOUNTER — Other Ambulatory Visit

## 2023-08-24 ENCOUNTER — Ambulatory Visit (INDEPENDENT_AMBULATORY_CARE_PROVIDER_SITE_OTHER): Admitting: Neurology

## 2023-08-24 ENCOUNTER — Encounter: Payer: Self-pay | Admitting: Neurology

## 2023-08-24 VITALS — BP 112/72 | HR 93 | Ht 65.0 in | Wt 120.0 lb

## 2023-08-24 DIAGNOSIS — R202 Paresthesia of skin: Secondary | ICD-10-CM | POA: Diagnosis not present

## 2023-08-24 DIAGNOSIS — Z170 Estrogen receptor positive status [ER+]: Secondary | ICD-10-CM

## 2023-08-24 DIAGNOSIS — E539 Vitamin B deficiency, unspecified: Secondary | ICD-10-CM | POA: Diagnosis not present

## 2023-08-24 DIAGNOSIS — C50811 Malignant neoplasm of overlapping sites of right female breast: Secondary | ICD-10-CM

## 2023-08-24 NOTE — Progress Notes (Signed)
 [facility] Clinic Note - Initial Visit   Date: 08/24/2023   [name] MRN: [id number] DOB: [birth year]   Dear Lynwood Roosevelt, PA-C:  Thank you for your kind referral of [name] for consultation of right arm pain. Although her history is well known to you, please allow us  to reiterate it for the purpose of our medical record. The patient was accompanied to the clinic by husband who also provides collateral information.     [name] is a 63 y.o. right-handed female with right breast cancer s/p mastectomy (2018) with recurrent metastatic cancer (06/2023), hypertension, hypothyroidism, GERD, anxiety, and depression presenting for evaluation of right arm paresthesias.   IMPRESSION/PLAN: Right arm paresthesias and radiating pain down the arm following supraclavicular lymph node biopsy.  She may have had some irritation to the brachial plexus with administration of local anesthetic.  With her history of metastatic cancer and ongoing pain, I will image the brachial plexus to look for structural pathology.  She cannot get EMG on that side, so electrodiagnostic testing was not ordered.   - MRI of the right brachial plexus  - Check vitamin B12, folate, and vitamin B1  - Discussed that gabapentin  100mg  is used as a daily medication, however, she reports having benefit if she takes it as needed  Return to clinic in 3 months  ------------------------------------------------------------- History of present illness: On 4/29, she had ultrasound guided right supraclavicular lymph node biopsy and during the procedure she developed severe jolt of pain radiating down the right arm and into her finger tips.  Her symptoms improved with local anesthetic administration, but returned after this wore off.  She continues to have spells of radiating pain down the arm and into the hand occurring daily, which lasts a few minutes.  She also has numbness and tingling.    She takes gabapentin  100mg  twice per week, when her pain is severe.  She also takes perocoet for her bone pain.   Nonsmoker.  She drinks about 5 beers in one sitting several times per week.    Out-side paper records, electronic medical record, and images have been reviewed where available and summarized as:  Lab Results  Component Value Date   TSH 0.45 04/10/2013   Lab Results  Component Value Date   ESRSEDRATE 1 11/06/2015    Past Medical History:  Diagnosis Date   Anxiety    Anxiety disorder    Asthma    h/o asthma as a child   Breast cancer (HCC)    Cancer (HCC) 04/2016   right breast cancer   Cholelithiasis    Chronic kidney disease    obstruction of R kidney, ( not a stone) - currently resolved    Depression    Diverticulosis    DJD (degenerative joint disease)    hands & back    Dyspnea    resolved since she stopped smoking    Epigastric abdominal pain    Esophageal stricture    Family history of adverse reaction to anesthesia    daughter has N&V, takes long time to wake up    Family history of colon cancer    Family history of ovarian cancer    Family hx of colon cancer    Female pelvic peritoneal adhesions 10/26/2012   Ganglion cyst    GERD (gastroesophageal reflux disease)    Headache    low grade currently , family history of migraines    Hemorrhoid    History of  radiation therapy 05/03 - 08/11/2016   1. 4 field Right breast was treated to 50.4 Gy in 25 fractions at 1.8 Gy per fraction. 2. The Right breast was boosted to 10 Gy in 5 fractions at 2 Gy per fraction.   Hypertension    Hypothyroidism    Plantar fasciitis, bilateral    PONV (postoperative nausea and vomiting)    gets anxious with the mask on her face, also remarks that the scop. patch has helped in the past       Past Surgical History:  Procedure Laterality Date   ABDOMINAL HYSTERECTOMY     BREAST IMPLANT REMOVAL Bilateral 01/24/2019   Procedure: REMOVAL BREAST IMPLANTS;  Surgeon:  Arelia Filippo, MD;  Location: [facility];  Service: Plastics;  Laterality: Bilateral;   BREAST RECONSTRUCTION WITH PLACEMENT OF TISSUE EXPANDER AND FLEX HD (ACELLULAR HYDRATED DERMIS) Bilateral 05/19/2016   Procedure: BREAST RECONSTRUCTION WITH PLACEMENT OF TISSUE EXPANDER AND ALLODERM PLACEMENT;  Surgeon: Filippo Arelia, MD;  Location: [facility];  Service: Plastics;  Laterality: Bilateral;   BREAST RECONSTRUCTION WITH PLACEMENT OF TISSUE EXPANDER AND FLEX HD (ACELLULAR HYDRATED DERMIS) Left 06/28/2017   Procedure: LEFT BREAST RECONSTRUCTION WITH SILICONE IMPLANT EXCHANGE AND ACELLULARDERMIS TO LEFT CHEST;  Surgeon: Arelia Filippo, MD;  Location: [facility];  Service: Plastics;  Laterality: Left;   CAPSULECTOMY Bilateral 01/24/2019   Procedure: CAPSULECTOMY;  Surgeon: Arelia Filippo, MD;  Location: [facility];  Service: Plastics;  Laterality: Bilateral;   CESAREAN SECTION  1988   CHOLECYSTECTOMY     HEMORRHOID SURGERY     LAPAROSCOPIC BILATERAL SALPINGECTOMY N/A 10/26/2012   Procedure: operative laparoscopy with lysis of adhesions;  Surgeon: Ezzie Marshall, MD;  Location: [facility];  Service: Gynecology;  Laterality: N/A;   LESION EXCISION WITH COMPLEX REPAIR Right 06/09/2019   Procedure: COMPLEX REPAIR RIGHT CHEST 15CM;  Surgeon: Arelia Filippo, MD;  Location: [facility];  Service: Plastics;  Laterality: Right;   MASTECTOMY Bilateral    MASTECTOMY WITH RADIOACTIVE SEED GUIDED EXCISION AND AXILLARY SENTINEL LYMPH NODE BIOPSY Bilateral 05/19/2016   Procedure: RIGHT SKIN SPARING MASTECTOMY WITH RIGHT RADIOACTIVE SEED TARGETED DISSECTION AND RIGHT SENTINEL LYMPH NODE BIOPSY, LEFT PROPHYLACTIC SKIN SPARING MASTECTOMY;  Surgeon: Donnice Bury, MD;  Location: [facility];  Service: General;  Laterality: Bilateral;   REMOVAL OF BILATERAL TISSUE EXPANDERS WITH PLACEMENT OF BILATERAL BREAST IMPLANTS Bilateral  01/29/2017   Procedure: REMOVAL OF BILATERAL TISSUE EXPANDERS WITH PLACEMENT OF BILATERAL SILICONE BREAST IMPLANTS, ALLODERM TO LEFT BREAST RECONSTRUCTION;  RIGHT LATISSUMUS FLAP;  Surgeon: Arelia Filippo, MD;  Location: [facility];  Service: Plastics;  Laterality: Bilateral;  Requesting RNFA   SCAR REVISION Right 06/28/2017   Procedure: COMPLEX REVISION OF BACK SCAR;  Surgeon: Arelia Filippo, MD;  Location: [facility];  Service: Plastics;  Laterality: Right;   TONSILLECTOMY     TUBAL LIGATION     urologic surgery for ureteropelvic junction obstruction       Medications:  Outpatient Encounter Medications as of 08/24/2023  Medication Sig   ALPRAZolam  (XANAX ) 1 MG tablet Take 1 tablet by mouth 3 (three) times daily.   anastrozole  (ARIMIDEX ) 1 MG tablet Take 1 tablet (1 mg total) by mouth daily.   celecoxib  (CELEBREX ) 200 MG capsule Take 1 capsule (200 mg total) by mouth daily.   cloNIDine  (CATAPRES ) 0.1 MG tablet TAKE 1 TABLET BY MOUTH EVERY DAY   gabapentin  (NEURONTIN ) 100 MG capsule Take 1 capsule every day  by oral route at dinner. (Patient taking differently: Take 100 mg by mouth as needed.)   KISQALI, 600 MG DOSE, 200 MG Therapy Pack TAKE 3 TABLETS BY MOUTH 1 TIME A DAY FOR 21 DAYS ON, 7 DAYS OFF, REPEAT EVERY 28 DAYS. TAKE AS INSTRUCTED PER MD.   levothyroxine  (SYNTHROID , LEVOTHROID) 50 MCG tablet Take 50 mcg by mouth daily before breakfast.   magic mouthwash (nystatin , lidocaine , diphenhydrAMINE , alum & mag hydroxide) suspension Swish and swallow 5 mLs by mouth 4 (four) times daily as needed for mouth pain.   omeprazole  (PRILOSEC) 40 MG capsule TAKE 1 CAPSULE BY MOUTH TWICE A DAY   ondansetron  (ZOFRAN ) 8 MG tablet Take 1 tablet (8 mg total) by mouth every 4 (four) hours as needed for nausea or vomiting.   oxyCODONE -acetaminophen  (PERCOCET/ROXICET) 5-325 MG tablet Take 1 tablet by mouth every 6 (six) hours as needed for severe pain (pain score 7-10).   potassium chloride   (KLOR-CON  M) 10 MEQ tablet Take 1 tablet (10 mEq total) by mouth 2 (two) times daily.   prochlorperazine  (COMPAZINE ) 10 MG tablet Take 1 tablet (10 mg total) by mouth every 6 (six) hours as needed for nausea or vomiting.   venlafaxine  XR (EFFEXOR -XR) 150 MG 24 hr capsule TAKE 1 CAPSULE BY MOUTH EVERY DAY WITH BREAKFAST   venlafaxine  XR (EFFEXOR -XR) 75 MG 24 hr capsule TAKE 1 CAPSULE BY MOUTH DAILY WITH BREAKFAST.   zolpidem  (AMBIEN ) 5 MG tablet Take 1-2 tablets (5-10 mg total) by mouth at bedtime as needed for sleep.   valACYclovir  (VALTREX ) 500 MG tablet Take 1 tablet (500 mg total) by mouth 2 (two) times daily. (Patient not taking: Reported on 08/24/2023)   No facility-administered encounter medications on file as of 08/24/2023.    Allergies:  Allergies  Allergen Reactions   Adhesive [Tape] Dermatitis    Pt prefers paper tape   Promethazine Hcl Itching and Nausea And Vomiting   Sulfa  Antibiotics Rash    Family History: Family History  Problem Relation Age of Onset   COPD Mother    Ovarian cancer Mother 27       surgery, no chemo   Kidney disease Mother    Colon polyps Father    Diabetes Father    Heart disease Father    Hypertension Father    Colon cancer Father 86   Other Father        Amyloidosis   Cancer Father        2nd cancer later in life, type unk   Hypertension Sister    Depression Sister    Arthritis Sister    Von Willebrand disease Sister    Penile cancer Paternal Uncle 41   Rheum arthritis Maternal Grandmother    Breast cancer Paternal Grandmother        pt doesn't remember, but her sister says this grandmother had br cancer   Goiter Paternal Grandmother    Esophageal cancer Neg Hx    Stomach cancer Neg Hx    Rectal cancer Neg Hx     Social History: Social History   Tobacco Use   Smoking status: Former    Current packs/day: 0.00    Average packs/day: 0.5 packs/day for 15.0 years (7.5 ttl pk-yrs)    Types: Cigarettes    Start date: 04/08/2001    Quit  date: 04/08/2016    Years since quitting: 7.3   Smokeless tobacco: Never  Vaping Use   Vaping status: Never Used  Substance Use Topics   Alcohol  use: Yes    Alcohol/week: 14.0 standard drinks of alcohol    Types: 12 Cans of beer, 2 Standard drinks or equivalent per week    Comment: moderate, daily    Drug use: No   Social History   Social History Narrative   HSG, some community college. .Married -'81.   2 daughters  '83, '88 daughter  with bipolar dz. Has had behavior issues. 1 granddaughter '05 living with her. Occupation:Bank worker. Marriage in good. No history of abuse.      Are you right handed or left handed?  Right Handed    Are you currently employed ? Yes   What is your current occupation? Works from home   Do you live at home alone? No    Who lives with you? Husband    What type of home do you live in: 1 story or 2 story? One story home        Vital Signs:  BP 112/72   Pulse 93   Ht 5' 5 (1.651 m)   Wt 120 lb (54.4 kg)   SpO2 97%   BMI 19.97 kg/m   Neurological Exam: MENTAL STATUS including orientation to time, place, person, recent and remote memory, attention span and concentration, language, and fund of knowledge is normal.  Speech is not dysarthric.  CRANIAL NERVES: II:  No visual field defects.     III-IV-VI: Pupils equal round and reactive to light.  Normal conjugate, extra-ocular eye movements in all directions of gaze.  No nystagmus.  No ptosis.   V:  Normal facial sensation.    VII:  Normal facial symmetry and movements.   VIII:  Normal hearing and vestibular function.   IX-X:  Normal palatal movement.   XI:  Normal shoulder shrug and head rotation.   XII:  Normal tongue strength and range of motion, no deviation or fasciculation.  MOTOR:  No atrophy, fasciculations or abnormal movements.  No pronator drift.   Upper Extremity:  Right  Left  Deltoid  5/5   5/5   Biceps  5/5   5/5   Triceps  5/5   5/5   Wrist extensors  5/5   5/5   Wrist flexors   5/5   5/5   Finger extensors  5/5   5/5   Finger flexors  5/5   5/5   Dorsal interossei  5/5   5/5   Abductor pollicis  5/5   5/5   Tone (Ashworth scale)  0  0   Lower Extremity:  Right  Left  Hip flexors  5/5   5/5   Knee flexors  5/5   5/5   Knee extensors  5/5   5/5   Dorsiflexors  5/5   5/5   Plantarflexors  5/5   5/5   Toe extensors  5/5   5/5   Toe flexors  5/5   5/5   Tone (Ashworth scale)  0  0   MSRs:                                           Right        Left brachioradialis 1+  2+  biceps 2+  2+  triceps 2+  2+  patellar 2+  2+  ankle jerk 2+  2+  Hoffman no  no  plantar response down  down  SENSORY:  Vibration is reduced in the right hand, as compared to the left.  Temperature an and pin prick is intact throughout.   COORDINATION/GAIT: Normal finger-to- nose-finger.  Intact rapid alternating movements bilaterally.   Gait narrow based and stable. Stressed gait intact.  Unsteady with tandem gait.     Thank you for allowing me to participate in patient's care.  If I can answer any additional questions, I would be pleased to do so.    Sincerely,    Jahlon Baines K. Tobie, DO

## 2023-08-24 NOTE — Patient Instructions (Signed)
 MRI right brachial plexus Check labs

## 2023-08-25 ENCOUNTER — Encounter: Payer: Self-pay | Admitting: Registered Nurse

## 2023-08-25 DIAGNOSIS — C50811 Malignant neoplasm of overlapping sites of right female breast: Secondary | ICD-10-CM | POA: Diagnosis not present

## 2023-08-25 DIAGNOSIS — Z7969 Long term (current) use of other immunomodulators and immunosuppressants: Secondary | ICD-10-CM | POA: Diagnosis not present

## 2023-08-25 DIAGNOSIS — E039 Hypothyroidism, unspecified: Secondary | ICD-10-CM | POA: Diagnosis not present

## 2023-08-25 DIAGNOSIS — R7989 Other specified abnormal findings of blood chemistry: Secondary | ICD-10-CM | POA: Diagnosis not present

## 2023-08-25 DIAGNOSIS — Z0000 Encounter for general adult medical examination without abnormal findings: Secondary | ICD-10-CM | POA: Diagnosis not present

## 2023-08-26 ENCOUNTER — Other Ambulatory Visit: Payer: Self-pay | Admitting: Registered Nurse

## 2023-08-26 ENCOUNTER — Encounter: Payer: Self-pay | Admitting: Neurology

## 2023-08-26 DIAGNOSIS — E041 Nontoxic single thyroid nodule: Secondary | ICD-10-CM

## 2023-08-28 LAB — VITAMIN B1: Vitamin B1 (Thiamine): <6 nmol/L — ABNORMAL LOW (ref 8–30)

## 2023-08-28 LAB — B12 AND FOLATE PANEL
Folate: 5.5 ng/mL
Vitamin B-12: 282 pg/mL (ref 200–1100)

## 2023-08-30 ENCOUNTER — Ambulatory Visit: Payer: Self-pay | Admitting: Neurology

## 2023-09-02 ENCOUNTER — Encounter: Payer: Self-pay | Admitting: Neurology

## 2023-09-03 ENCOUNTER — Other Ambulatory Visit: Payer: Self-pay | Admitting: Hematology

## 2023-09-08 ENCOUNTER — Other Ambulatory Visit: Payer: Self-pay | Admitting: Nurse Practitioner

## 2023-09-10 ENCOUNTER — Encounter: Payer: Self-pay | Admitting: Neurology

## 2023-09-13 ENCOUNTER — Inpatient Hospital Stay
Admission: RE | Admit: 2023-09-13 | Discharge: 2023-09-13 | Discharge status: Home / Self Care | Source: Ambulatory Visit | Attending: Registered Nurse | Admitting: Registered Nurse

## 2023-09-13 DIAGNOSIS — E041 Nontoxic single thyroid nodule: Secondary | ICD-10-CM

## 2023-09-14 ENCOUNTER — Telehealth: Payer: Self-pay

## 2023-09-14 NOTE — Telephone Encounter (Addendum)
 Oral Oncology Patient Advocate Encounter   Was successful in obtaining a grant  for Kisqali.  This [name]  will make the patients copay $0.  Due to patient having a high copay of $[id number]  through  her  commercial insurance BC/BS  her copay card benefits are completely exhausted.  Per [facility] their grant program [employer] will cover the remainder of the copay. This was confirmed via conference call with the patient and [facility].   Expected Delivery date is 09/23/23    Charlott Hamilton,  CPhT-Adv  she/her/hers [facility]  [facility] Pharmacy Technician Patient Advocate Specialist III [facility] Phone: [phone]  Fax: [phone] [email or web address]

## 2023-09-15 ENCOUNTER — Other Ambulatory Visit (HOSPITAL_COMMUNITY): Payer: Self-pay

## 2023-09-15 ENCOUNTER — Other Ambulatory Visit: Payer: Self-pay | Admitting: Nurse Practitioner

## 2023-09-15 DIAGNOSIS — Z170 Estrogen receptor positive status [ER+]: Secondary | ICD-10-CM

## 2023-09-15 NOTE — Assessment & Plan Note (Addendum)
 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 S/p bilateral mastectomy with reconstruction and adjuvant radiation. (Implants ultimately removed 01/24/19 by Dr. Arelia.) -She started antiestrogen therapy Exemestane  on 04/01/16, planned for 7 years. She experienced joint pain and hot flashes on exemestane , anastrozole , and tamoxifen . She opted to continue anastrozole . Given her continued difficulty with joint pain, as well as her low risk mammaprint, she stopped after she completed 5 years therapy in 03/2021 -she developed low appetite and weight loss in Feb 2025, CT and bone scan showed bone lesions. PET on 06/01/2023 showed diffuse hypermetabolic bone lesions and hypermetabolic small lymph node in the right axillary region and supraclavicular region on the right low neck, concerning for metastatic disease. -[place] node biopsy on 06/22/2023 confirmed metastatic breast cancer, will request NGS  -she started anastrozole  in 05/2023, will add Ribciclib and zometa   - Started Kisqali 2 weeks ago.  Currently on 7 days off Kisqali. Overall, tolerating this very well. Recommend she restart as scheduled 07/23/2023. Start with 2 tablets daily for 3 days then increase to 3 tablets daily.  - 08/19/2023 -patient not taking Kisqali 3 tablets daily.  Takes for 21 days followed by 7 days off.  She is due to start new cycle tomorrow, 08/20/2023.  Will start Zometa  infusions every 6 months, first infusion today. - 09/16/2023 -patient tolerating anastrozole  and Kisqali well overall.  Does have some side effects including fatigue, decreased appetite, intermittent headaches, and intermittent diarrhea.  She is able to manage the symptoms with medications.  Will see palliative care NP, Nikki today. -Plan to continue anastrozole  and Kisqali as prescribed.  Labs and follow-up in 4 weeks, sooner if needed.

## 2023-09-15 NOTE — Progress Notes (Signed)
 Patient Care Team: [name], FNP as PCP - General (Internal Medicine) Aplington, [name], MD (Inactive) (Orthopedic Surgery) [name], MD (Ophthalmology) [name], MD (Dermatology) [name], MD as Consulting Physician (Hematology) [name], MD as Consulting Physician (General Surgery) [name], MD as Consulting Physician (Radiation Oncology) [name], [name], NP as Nurse Practitioner (Hematology and Oncology) Pickenpack-Cousar, [name], NP as Nurse Practitioner (Hospice and Palliative Medicine) Patel, [name], DO as Consulting Physician (Neurology)  Clinic Day:  09/16/2023  Referring physician: [name], FNP  ASSESSMENT & PLAN:   Assessment & Plan: Malignant neoplasm of overlapping sites of right breast in female, estrogen receptor positive (HCC)  mpT2 pN1a, stage IA, G1,  ER positive, PR positive, HER-2 negative, mammaprint low risk luminal type A -Diagnosed in 02/2016. S/p bilateral mastectomy with reconstruction and adjuvant radiation. (Implants ultimately removed 01/24/19 by Dr. Arelia.) -She started antiestrogen therapy Exemestane  on 04/01/16, planned for 7 years. She experienced joint pain and hot flashes on exemestane , anastrozole , and tamoxifen . She opted to continue anastrozole . Given her continued difficulty with joint pain, as well as her low risk mammaprint, she stopped after she completed 5 years therapy in 03/2021 -she developed low appetite and weight loss in Feb 2025, CT and bone scan showed bone lesions. PET on 06/01/2023 showed diffuse hypermetabolic bone lesions and hypermetabolic small lymph node in the right axillary region and supraclavicular region on the right low neck, concerning for metastatic disease. -[facility] node biopsy on 06/22/2023 confirmed metastatic breast cancer, will request NGS  -she started anastrozole  in 05/2023, will add Ribciclib and zometa   - Started Kisqali 2 weeks ago.  Currently on 7 days off  Kisqali. Overall, tolerating this very well. Recommend she restart as scheduled 07/23/2023. Start with 2 tablets daily for 3 days then increase to 3 tablets daily.  - 08/19/2023 -patient not taking Kisqali 3 tablets daily.  Takes for 21 days followed by 7 days off.  She is due to start new cycle tomorrow, 08/20/2023.  Will start Zometa  infusions every 6 months, first infusion today. - 09/16/2023 -patient tolerating anastrozole  and Kisqali well overall.  Does have some side effects including fatigue, decreased appetite, intermittent headaches, and intermittent diarrhea.  She is able to manage the symptoms with medications.  Will see palliative care NP, Nikki today. -Plan to continue anastrozole  and Kisqali as prescribed.  Labs and follow-up in 4 weeks, sooner if needed.   Elevated liver functions Moderate elevation of AST and ALT.  When discussing the potential causes of this, patient's close that she does consume alcohol every night.  This is likely contributing factor of the rise in liver functions.  Encouraged she cut back alcohol use in order to improve liver functions.  Will recheck labs in 3 to 4 weeks.  Neutropenia Stable will WBC at 2.4 with ANC 0.7.  This is expected due to treatment with because quality.  Will continue to monitor these counts closely and adjust dosing or change regimen in the future as indicated.  Plan Reviewed labs. - Stable neutropenia with WBC 2.4 and ANC 0.7. - Moderate elevation of LFTs.  Likely secondary to daily alcohol intake.  Patient advised to reduce alcohol intake.  Recheck at next visit. - CA 27-29 -improving at 371.9. Overall, tolerating treatment with anastrozole  and Kisqali as well. Continue anastrozole  1 mg daily. Continue Kisqali 600 mg daily (21 days on followed by 7 days off). Labs and follow-up in 4 weeks, sooner if needed.   The patient understands  the plans discussed today and is in agreement with them.  She knows to contact our office if she  develops concerns prior to her next appointment.  I provided 25 minutes of face-to-face time during this encounter and > 50% was spent counseling as documented under my assessment and plan.    Powell FORBES Lessen, NP  [facility] - A DEPT OF [facility] [address] [place] [address] Dept: [phone] Dept Fax: [phone]   No orders of the defined types were placed in this encounter.     CHIEF COMPLAINT:  CC: Metastatic breast cancer, estrogen receptor positive  Current Treatment: Anastrozole  1 mg daily and Kisqali 600 mg daily for 21 days on and 7 days off  INTERVAL HISTORY:  Larua is here today for repeat clinical assessment.  She last saw me on 08/19/2023.  She continues to have fair appetite.  She states she definitely eats when hungry but does not eat quite as much.  She does have diarrhea intermittently.  She uses Imodium intermittently.  Diarrhea occurs mostly towards the end of each cycle and then resolves prior to restarting the next cycle.  She does have fatigue.  She feels like it is gradually happening more frequently in a year.  She denies chest pain, chest pressure, or shortness of breath. He denies visual disturbances. She denies abdominal pain, nausea, vomiting, or changes in bowel or bladder habits.  She denies fevers or chills.  She has not seen palliative care NP, Nikki, to help her manage pain more effectively.  Her appetite is fair. Her weight has decreased 2 pounds over last 3 weeks.  I have reviewed the past medical history, past surgical history, social history and family history with the patient and they are unchanged from previous note.  ALLERGIES:  is allergic to adhesive [tape], promethazine hcl, and sulfa  antibiotics.  MEDICATIONS:  Current Outpatient Medications  Medication Sig Dispense Refill   ALPRAZolam  (XANAX ) 1 MG tablet Take 1 tablet by mouth 3 (three) times daily.      anastrozole  (ARIMIDEX ) 1 MG tablet TAKE 1 TABLET BY MOUTH EVERY DAY 90 tablet 1   celecoxib  (CELEBREX ) 200 MG capsule Take 1 capsule (200 mg total) by mouth daily. 30 capsule 2   cloNIDine  (CATAPRES ) 0.1 MG tablet TAKE 1 TABLET BY MOUTH EVERY DAY 90 tablet 1   gabapentin  (NEURONTIN ) 100 MG capsule Take 1 capsule every day by oral route at dinner. (Patient taking differently: Take 100 mg by mouth as needed.)     KISQALI, 600 MG DOSE, 200 MG Therapy Pack TAKE 3 TABLETS BY MOUTH 1 TIME A DAY FOR 21 DAYS ON, 7 DAYS OFF, REPEAT EVERY 28 DAYS. TAKE AS INSTRUCTED PER MD. 63 tablet 0   levothyroxine  (SYNTHROID , LEVOTHROID) 50 MCG tablet Take 50 mcg by mouth daily before breakfast.     omeprazole  (PRILOSEC) 40 MG capsule TAKE 1 CAPSULE BY MOUTH TWICE A DAY 180 capsule 2   ondansetron  (ZOFRAN ) 8 MG tablet TAKE 1 TABLET (8 MG TOTAL) BY MOUTH EVERY 4 (FOUR) HOURS AS NEEDED FOR NAUSEA OR VOMITING. 135 tablet 1   potassium chloride  (KLOR-CON  M) 10 MEQ tablet Take 1 tablet (10 mEq total) by mouth 2 (two) times daily. 180 tablet 1   prochlorperazine  (COMPAZINE ) 10 MG tablet Take 1 tablet (10 mg total) by mouth every 6 (six) hours as needed for nausea or vomiting. 60 tablet 0   venlafaxine  XR (  EFFEXOR -XR) 150 MG 24 hr capsule TAKE 1 CAPSULE BY MOUTH EVERY DAY WITH BREAKFAST 90 capsule 1   venlafaxine  XR (EFFEXOR -XR) 75 MG 24 hr capsule TAKE 1 CAPSULE BY MOUTH DAILY WITH BREAKFAST. 90 capsule 2   oxyCODONE -acetaminophen  (PERCOCET/ROXICET) 5-325 MG tablet Take 1 tablet by mouth every 6 (six) hours as needed for severe pain (pain score 7-10). 45 tablet 0   valACYclovir  (VALTREX ) 500 MG tablet Take 1 tablet (500 mg total) by mouth 2 (two) times daily. (Patient not taking: Reported on 08/24/2023) 10 tablet 1   zolpidem  (AMBIEN ) 5 MG tablet Take 1-2 tablets (5-10 mg total) by mouth at bedtime as needed for sleep. 90 tablet 1   No current facility-administered medications for this visit.    HISTORY OF PRESENT ILLNESS:    Oncology History Overview Note  Cancer Staging Malignant neoplasm of overlapping sites of right breast in female, estrogen receptor positive (HCC) Staging form: Breast, AJCC 8th Edition - Clinical stage from 03/23/2016: Stage IIA (cT3, cN1, cM0, G2, ER: Positive, PR: Positive, HER2: Negative) - Signed by Onita Mattock, MD on 03/31/2016 - Pathologic stage from 05/19/2016: Stage IA (pT2(m), pN1a, cM0, G1, ER: Positive, PR: Positive, HER2: Negative) - Signed by Onita Mattock, MD on 06/18/2016     Malignant neoplasm of overlapping sites of right breast in female, estrogen receptor positive (HCC)  03/17/2016 Mammogram   B/l diagnostic mammogram and righ US  showed a 3.6cm irregular mass in the right breast 11:00 position, posterior depth, there is a enlarged lymph node in the right axilla is highly suspicious for malignancy. additional 7 mm oval mass in the right breast lower outer quadrant is suspicious for malignancy.   03/23/2016 Initial Biopsy   Right breast 9:30 position biopsy showed invasive ductal carcinoma, grade 1. 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   03/23/2016 Receptors her2   Both breast and node biopsy tumor ER 100% positive, PR 70-95% positive, HER-2 negative, Ki-67 40%   03/23/2016 Initial Diagnosis   Malignant neoplasm of upper-outer quadrant of right breast in female, estrogen receptor positive (HCC)   03/25/2016 Initial Biopsy   Right breast 11:00 position core needle biopsy showed invasive duct carcinoma, grade 2.    03/25/2016 Receptors her2   ER 95% positive, PR 90% positive, HER-2 negative, Ki-67 15%   03/25/2016 Miscellaneous   Mammaprint showed low risk type A with index +0.105   03/30/2016 Imaging   Bilateral breast MRI with and without contrast showed a large lobulated enhancing mass within the upper-outer and lower outer right breast with surrounding nodularity, measuring 6.1 x 4.4 x 5.6 cm. Multiple critically sick and right axillary lymph nodes  are demonstrated measuring up to 1.5 cm.    04/01/2016 -  Anti-estrogen oral therapy   Exemestane  25 mg daily, plan for 7 years. Switched to Anastrozole  1mg  in 06/2017 due to joint pain and hot flashes. Due to persistent side effects I changed her to Tamoxifen  in 09/2018. She stopped Tamoxifen  and switched back to anastrozole  in 01/2019 because it was more tolerable.    05/19/2016 Surgery   Bilateral mastectomy and right axillary regional lymph node resection.   05/19/2016 Pathology Results   -Right axillary regional lymph node resection revealed metastatic carcinoma in 2/7 lymph nodes. -Left simple mastectomy revealed lobular neoplasia and fibrocystic changes with adenosis and calcifications. 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 -Skin of the right mastectomy flap was benign. -mpT2, pN1a   06/25/2016 - 08/11/2016 Radiation Therapy   Site/dose:    1. 4 field Right breast was treated to 50.4 Gy in 25 fractions at 1.8 Gy per fraction. 2. The Right breast was boosted to 10 Gy in 5 fractions at 2 Gy per fraction.   04/15/2017 Genetic Testing   The patient had genetic testing due to a personal history of breast cancer and family history of breast, ovarian, and colon cancer. The Common Hereditary Cancer Panel was ordered.  The Common Hereditary Cancer Panel offered by [facility] includes sequencing and/or deletion duplication testing of the following 47 genes: APC, ATM, AXIN2, BARD1, BMPR1A, BRCA1, BRCA2, BRIP1, CDH1, CDKN2A (p14ARF), CDKN2A (p16INK4a), CKD4, CHEK2, CTNNA1, DICER1, EPCAM (Deletion/duplication testing only), GREM1 (promoter region deletion/duplication testing only), KIT, MEN1, MLH1, MSH2, MSH3, MSH6, MUTYH, NBN, NF1, NHTL1, PALB2, PDGFRA, PMS2, POLD1, POLE, PTEN, RAD50, RAD51C, RAD51D,  SDHB, SDHC, SDHD, SMAD4, SMARCA4. STK11, TP53, TSC1, TSC2, and VHL.  The following genes were evaluated for sequence changes only: SDHA and HOXB13 c.251G>A variant only.  Results: Negative, no pathogenic variants identified. The date of this test report is 04/15/2017   06/28/2017 Surgery   COMPLEX REVISION OF BACK SCAR and LEFT BREAST RECONSTRUCTION WITH SILICONE IMPLANT EXCHANGE AND ACELLULARDERMIS TO LEFT CHEST by Dr. Arelia 06/28/17   06/13/2023 Pathology Results   FINAL MICROSCOPIC DIAGNOSIS: A. LYMPH NODE, BIOPSY: -  Metastatic carcinoma morphologically consistent with the patient's known breast primary. ADDENDUM:  An immunohistochemical stain for GATA3 is positive supporting the morphological and clinical impression of breast primary. ADDENDUM: Lymph node, biopsy - Metastatic carcinoma  PROGNOSTIC INDICATORS Results:  IMMUNOHISTOCHEMICAL AND MORPHOMETRIC ANALYSIS PERFORMED MANUALLY  The tumor cells are negative for Her2 (1+). Estrogen Receptor:  70%, positive, strong staining intensity Progesterone Receptor:  80%, positive, strong staining intensity Proliferation Marker Ki67: 20% COMMENT:  The negative hormone receptor study(ies) in this case has an internal positive control.  REFERENCE RANGE ESTROGEN RECEPTOR        NEGATIVE     0%        POSITIVE       =>1% REFERENCE RANGE PROGESTERONE RECEPTOR        NEGATIVE     0%        POSITIVE        =>1% All controls stained appropriately         REVIEW OF SYSTEMS:   Constitutional: Denies fevers, chills or abnormal weight loss. Moderate fatigue.  Eyes: Denies blurriness of vision Ears, nose, mouth, throat, and face: Denies mucositis or sore throat Respiratory: Denies cough, dyspnea or wheezes Cardiovascular: Denies palpitation, chest discomfort or lower extremity swelling Gastrointestinal:  Denies nausea, heartburn or change in bowel habits. Does experience intermittent diarrhea. Takes imodium when needed.  Skin: Denies  abnormal skin rashes Lymphatics: Denies new lymphadenopathy or easy bruising Neurological:Denies numbness, tingling or new weaknesses Behavioral/Psych: Mood is stable, no new changes  All other systems were reviewed with the patient and are negative.   VITALS:   Today's Vitals   09/16/23 1259 09/16/23 1316  BP: 138/88   Pulse: 76   Resp: 17   Temp: 97.8 F (36.6 C)   SpO2: 99%   Weight: 118 lb 6.4 oz (53.7 kg)   PainSc:  0-No pain   Body mass index is 19.7 kg/m.   Wt Readings from Last 3 Encounters:  09/16/23 118 lb 6.4 oz (53.7 kg)  08/24/23 120  lb (54.4 kg)  08/19/23 119 lb 11.2 oz (54.3 kg)    Body mass index is 19.7 kg/m.  Performance status (ECOG): 1 - Symptomatic but completely ambulatory  PHYSICAL EXAM:   GENERAL:alert, no distress and comfortable SKIN: skin color, texture, turgor are normal, no rashes or significant lesions EYES: normal, Conjunctiva are pink and non-injected, sclera clear OROPHARYNX:no exudate, no erythema and lips, buccal mucosa, and tongue normal  NECK: supple, thyroid  normal size, non-tender, without nodularity LYMPH:  no palpable lymphadenopathy in the cervical, axillary or inguinal LUNGS: clear to auscultation and percussion with normal breathing effort HEART: regular rate & rhythm and no murmurs and no lower extremity edema ABDOMEN:abdomen soft, non-tender and normal bowel sounds Musculoskeletal:no cyanosis of digits and no clubbing  NEURO: alert & oriented x 3 with fluent speech, no focal motor/sensory deficits  LABORATORY DATA:  I have reviewed the data as listed    Component Value Date/Time   NA 140 09/16/2023 1221   NA 141 02/19/2017 1304   K 4.7 09/16/2023 1221   K 4.0 02/19/2017 1304   CL 103 09/16/2023 1221   CO2 31 09/16/2023 1221   CO2 26 02/19/2017 1304   GLUCOSE 88 09/16/2023 1221   GLUCOSE 99 02/19/2017 1304   BUN 14 09/16/2023 1221   BUN 16.4 02/19/2017 1304   CREATININE 0.90 09/16/2023 1221   CREATININE 0.9  02/19/2017 1304   CALCIUM 8.8 (L) 09/16/2023 1221   CALCIUM 8.8 02/19/2017 1304   PROT 6.5 09/16/2023 1221   PROT 6.2 (L) 02/19/2017 1304   ALBUMIN  4.1 09/16/2023 1221   ALBUMIN  3.6 02/19/2017 1304   AST 103 (H) 09/16/2023 1221   AST 22 02/19/2017 1304   ALT 111 (H) 09/16/2023 1221   ALT 23 02/19/2017 1304   ALKPHOS 70 09/16/2023 1221   ALKPHOS 88 02/19/2017 1304   BILITOT 0.7 09/16/2023 1221   BILITOT 0.39 02/19/2017 1304   GFRNONAA >60 09/16/2023 1221   GFRNONAA 72 11/06/2015 0831   GFRAA >60 10/12/2019 1445   GFRAA 83 11/06/2015 0831      Lab Results  Component Value Date   WBC 2.4 (L) 09/16/2023   NEUTROABS 0.7 (L) 09/16/2023   HGB 13.4 09/16/2023   HCT 39.3 09/16/2023   MCV 94.2 09/16/2023   PLT 222 09/16/2023

## 2023-09-16 ENCOUNTER — Inpatient Hospital Stay (HOSPITAL_BASED_OUTPATIENT_CLINIC_OR_DEPARTMENT_OTHER): Admitting: Nurse Practitioner

## 2023-09-16 ENCOUNTER — Inpatient Hospital Stay: Attending: Nurse Practitioner

## 2023-09-16 ENCOUNTER — Telehealth: Payer: Self-pay | Admitting: Hematology

## 2023-09-16 ENCOUNTER — Encounter: Payer: Self-pay | Admitting: Nurse Practitioner

## 2023-09-16 ENCOUNTER — Inpatient Hospital Stay: Admitting: Nurse Practitioner

## 2023-09-16 VITALS — BP 138/88 | HR 76 | Temp 97.8°F | Resp 17 | Wt 118.4 lb

## 2023-09-16 DIAGNOSIS — R7989 Other specified abnormal findings of blood chemistry: Secondary | ICD-10-CM | POA: Diagnosis not present

## 2023-09-16 DIAGNOSIS — G893 Neoplasm related pain (acute) (chronic): Secondary | ICD-10-CM | POA: Diagnosis not present

## 2023-09-16 DIAGNOSIS — C50811 Malignant neoplasm of overlapping sites of right female breast: Secondary | ICD-10-CM | POA: Diagnosis not present

## 2023-09-16 DIAGNOSIS — Z79899 Other long term (current) drug therapy: Secondary | ICD-10-CM | POA: Insufficient documentation

## 2023-09-16 DIAGNOSIS — Z1732 Human epidermal growth factor receptor 2 negative status: Secondary | ICD-10-CM | POA: Insufficient documentation

## 2023-09-16 DIAGNOSIS — G4709 Other insomnia: Secondary | ICD-10-CM

## 2023-09-16 DIAGNOSIS — Z170 Estrogen receptor positive status [ER+]: Secondary | ICD-10-CM | POA: Diagnosis not present

## 2023-09-16 DIAGNOSIS — Z79624 Long term (current) use of inhibitors of nucleotide synthesis: Secondary | ICD-10-CM | POA: Insufficient documentation

## 2023-09-16 DIAGNOSIS — Z515 Encounter for palliative care: Secondary | ICD-10-CM | POA: Diagnosis not present

## 2023-09-16 DIAGNOSIS — C773 Secondary and unspecified malignant neoplasm of axilla and upper limb lymph nodes: Secondary | ICD-10-CM | POA: Diagnosis not present

## 2023-09-16 DIAGNOSIS — C7951 Secondary malignant neoplasm of bone: Secondary | ICD-10-CM | POA: Insufficient documentation

## 2023-09-16 DIAGNOSIS — R978 Other abnormal tumor markers: Secondary | ICD-10-CM | POA: Insufficient documentation

## 2023-09-16 DIAGNOSIS — Z79890 Hormone replacement therapy (postmenopausal): Secondary | ICD-10-CM | POA: Insufficient documentation

## 2023-09-16 DIAGNOSIS — Z1721 Progesterone receptor positive status: Secondary | ICD-10-CM | POA: Diagnosis not present

## 2023-09-16 DIAGNOSIS — Z79811 Long term (current) use of aromatase inhibitors: Secondary | ICD-10-CM | POA: Diagnosis not present

## 2023-09-16 DIAGNOSIS — D709 Neutropenia, unspecified: Secondary | ICD-10-CM | POA: Diagnosis not present

## 2023-09-16 LAB — CBC WITH DIFFERENTIAL (CANCER CENTER ONLY)
Abs Immature Granulocytes: 0 10*3/uL (ref 0.00–0.07)
Basophils Absolute: 0.1 10*3/uL (ref 0.0–0.1)
Basophils Relative: 5 %
Eosinophils Absolute: 0.1 10*3/uL (ref 0.0–0.5)
Eosinophils Relative: 3 %
HCT: 39.3 % (ref 36.0–46.0)
Hemoglobin: 13.4 g/dL (ref 12.0–15.0)
Immature Granulocytes: 0 %
Lymphocytes Relative: 58 %
Lymphs Abs: 1.4 10*3/uL (ref 0.7–4.0)
MCH: 32.1 pg (ref 26.0–34.0)
MCHC: 34.1 g/dL (ref 30.0–36.0)
MCV: 94.2 fL (ref 80.0–100.0)
Monocytes Absolute: 0.1 10*3/uL (ref 0.1–1.0)
Monocytes Relative: 6 %
Neutro Abs: 0.7 10*3/uL — ABNORMAL LOW (ref 1.7–7.7)
Neutrophils Relative %: 28 %
Platelet Count: 222 10*3/uL (ref 150–400)
RBC: 4.17 MIL/uL (ref 3.87–5.11)
RDW: 23 % — ABNORMAL HIGH (ref 11.5–15.5)
WBC Count: 2.4 10*3/uL — ABNORMAL LOW (ref 4.0–10.5)
nRBC: 0 % (ref 0.0–0.2)

## 2023-09-16 LAB — MAGNESIUM: Magnesium: 2.4 mg/dL (ref 1.7–2.4)

## 2023-09-16 LAB — CMP (CANCER CENTER ONLY)
ALT: 111 U/L — ABNORMAL HIGH (ref 0–44)
AST: 103 U/L — ABNORMAL HIGH (ref 15–41)
Albumin: 4.1 g/dL (ref 3.5–5.0)
Alkaline Phosphatase: 70 U/L (ref 38–126)
Anion gap: 6 (ref 5–15)
BUN: 14 mg/dL (ref 8–23)
CO2: 31 mmol/L (ref 22–32)
Calcium: 8.8 mg/dL — ABNORMAL LOW (ref 8.9–10.3)
Chloride: 103 mmol/L (ref 98–111)
Creatinine: 0.9 mg/dL (ref 0.44–1.00)
GFR, Estimated: >60 mL/min
Glucose, Bld: 88 mg/dL (ref 70–99)
Potassium: 4.7 mmol/L (ref 3.5–5.1)
Sodium: 140 mmol/L (ref 135–145)
Total Bilirubin: 0.7 mg/dL (ref 0.0–1.2)
Total Protein: 6.5 g/dL (ref 6.5–8.1)

## 2023-09-16 LAB — PHOSPHORUS: Phosphorus: 3.5 mg/dL (ref 2.5–4.6)

## 2023-09-16 MED ORDER — ZOLPIDEM TARTRATE 5 MG PO TABS
5.0000 mg | ORAL_TABLET | Freq: Every evening | ORAL | 1 refills | Status: DC | PRN
Start: 2023-09-16 — End: 2023-12-28

## 2023-09-16 MED ORDER — OXYCODONE-ACETAMINOPHEN 5-325 MG PO TABS: 1.0000 | ORAL_TABLET | Freq: Four times a day (QID) | ORAL | 0 refills | Status: DC | PRN

## 2023-09-16 NOTE — Progress Notes (Signed)
 Palliative Medicine [facility]  [phone] [phone]   Name: [name] Date: 09/16/2023 MRN: [id number]  DOB: 08-08-1960  Patient Care Team: [name], FNP as PCP - General (Internal Medicine) Aplington, [name], MD (Inactive) (Orthopedic Surgery) [name], MD (Ophthalmology) [name], MD (Inactive) (Dermatology) [name], MD as Consulting Physician (Hematology) [name], MD as Consulting Physician (General Surgery) [name], MD as Consulting Physician (Radiation Oncology) [name], [name], NP as Nurse Practitioner (Hematology and Oncology) Pickenpack-Cousar, [name], NP as Nurse Practitioner (Hospice and Palliative Medicine) Patel, [name], DO as Consulting Physician (Neurology)    INTERVAL HISTORY: [name] is a 63 y.o. female with with oncologic medical history including metastatic breast cancer with diffuse bone lesions, currently on anastrozole , Zometa , and Kisqali .  Palliative is seeing patient for symptom management and goals of care.   SOCIAL HISTORY:     reports that she quit smoking about 7 years ago. Her smoking use included cigarettes. She started smoking about 22 years ago. She has a 7.5 pack-year smoking history. She has never used smokeless tobacco. She reports current alcohol use of about 14.0 standard drinks of alcohol per week. She reports that she does not use drugs.  ADVANCE DIRECTIVES:  None on file   CODE STATUS: Full code  PAST MEDICAL HISTORY: Past Medical History:  Diagnosis Date   Anxiety    Anxiety disorder    Asthma    h/o asthma as a child   Breast cancer (HCC)    Cancer (HCC) 04/2016   right breast cancer   Cholelithiasis    Chronic kidney disease    obstruction of R kidney, ( not a stone) - currently resolved    Depression    Diverticulosis    DJD (degenerative joint disease)    hands & back    Dyspnea    resolved since she stopped  smoking    Epigastric abdominal pain    Esophageal stricture    Family history of adverse reaction to anesthesia    daughter has N&V, takes long time to wake up    Family history of colon cancer    Family history of ovarian cancer    Family hx of colon cancer    Female pelvic peritoneal adhesions 10/26/2012   Ganglion cyst    GERD (gastroesophageal reflux disease)    Headache    low grade currently , family history of migraines    Hemorrhoid    History of radiation therapy 05/03 - 08/11/2016   1. 4 field Right breast was treated to 50.4 Gy in 25 fractions at 1.8 Gy per fraction. 2. The Right breast was boosted to 10 Gy in 5 fractions at 2 Gy per fraction.   Hypertension    Hypothyroidism    Plantar fasciitis, bilateral    PONV (postoperative nausea and vomiting)    gets anxious with the mask on her face, also remarks that the scop. patch has helped in the past       ALLERGIES:  is allergic to adhesive [tape], promethazine hcl, and sulfa  antibiotics.  MEDICATIONS:  Current Outpatient Medications  Medication Sig Dispense Refill   ALPRAZolam  (XANAX ) 1 MG tablet Take 1 tablet by mouth 3 (three) times daily.     anastrozole  (ARIMIDEX ) 1 MG tablet TAKE 1 TABLET BY MOUTH EVERY DAY 90 tablet 1   celecoxib  (CELEBREX ) 200 MG capsule Take 1 capsule (200 mg total) by mouth daily. 30  capsule 2   cloNIDine  (CATAPRES ) 0.1 MG tablet TAKE 1 TABLET BY MOUTH EVERY DAY 90 tablet 1   gabapentin  (NEURONTIN ) 100 MG capsule Take 1 capsule every day by oral route at dinner. (Patient taking differently: Take 100 mg by mouth as needed.)     KISQALI, 600 MG DOSE, 200 MG Therapy Pack TAKE 3 TABLETS BY MOUTH 1 TIME A DAY FOR 21 DAYS ON, 7 DAYS OFF, REPEAT EVERY 28 DAYS. TAKE AS INSTRUCTED PER MD. 63 tablet 0   levothyroxine  (SYNTHROID , LEVOTHROID) 50 MCG tablet Take 50 mcg by mouth daily before breakfast.     omeprazole  (PRILOSEC) 40 MG capsule TAKE 1 CAPSULE BY MOUTH TWICE A DAY 180 capsule 2   ondansetron   (ZOFRAN ) 8 MG tablet TAKE 1 TABLET (8 MG TOTAL) BY MOUTH EVERY 4 (FOUR) HOURS AS NEEDED FOR NAUSEA OR VOMITING. 135 tablet 1   oxyCODONE -acetaminophen  (PERCOCET/ROXICET) 5-325 MG tablet Take 1 tablet by mouth every 6 (six) hours as needed for severe pain (pain score 7-10). 45 tablet 0   potassium chloride  (KLOR-CON  M) 10 MEQ tablet Take 1 tablet (10 mEq total) by mouth 2 (two) times daily. 180 tablet 1   prochlorperazine  (COMPAZINE ) 10 MG tablet Take 1 tablet (10 mg total) by mouth every 6 (six) hours as needed for nausea or vomiting. 60 tablet 0   venlafaxine  XR (EFFEXOR -XR) 150 MG 24 hr capsule TAKE 1 CAPSULE BY MOUTH EVERY DAY WITH BREAKFAST 90 capsule 1   venlafaxine  XR (EFFEXOR -XR) 75 MG 24 hr capsule TAKE 1 CAPSULE BY MOUTH DAILY WITH BREAKFAST. 90 capsule 2   zolpidem  (AMBIEN ) 5 MG tablet Take 1-2 tablets (5-10 mg total) by mouth at bedtime as needed for sleep. 90 tablet 1   valACYclovir  (VALTREX ) 500 MG tablet Take 1 tablet (500 mg total) by mouth 2 (two) times daily. (Patient not taking: Reported on 08/24/2023) 10 tablet 1   No current facility-administered medications for this visit.    VITAL SIGNS: There were no vitals taken for this visit. There were no vitals filed for this visit.  Estimated body mass index is 19.7 kg/m as calculated from the following:   Height as of 08/24/23: 5' 5 (1.651 m).   Weight as of an earlier encounter on 09/16/23: 118 lb 6.4 oz (53.7 kg).   PERFORMANCE STATUS (ECOG) : 1 - Symptomatic but completely ambulatory  Physical Exam General: NAD Cardiovascular: regular rate and rhythm Pulmonary: normal breathing pattern Extremities: no edema, no joint deformities Skin: no rashes Neurological: AAO x3  IMPRESSION: Discussed the use of AI scribe software for clinical note transcription with the patient, who gave verbal consent to proceed.  History of Present Illness [name] is a 63 year old female who presents  to clinic for follow-up. No  acute distress. She is accompanied by her spouse. Denies concerns for nausea, vomiting, or constipation.  She experiences diarrhea, particularly during her medication off-week. She has not been using any medication for this but recalls using Imodium once during a hospital visit.  She experiences ongoing insomnia however is controlled with ambien  5-10mg  at bedtime. Tolerating without difficulty. Requesting refill.   Pain is primarily localized to the tailbone area with occasional tingling in the arm. She has consulted a nerve specialist who did not find any abnormalities, and she is scheduled for an MRI next week. She uses oxycodone  as needed for pain management. Does not require daily. No adjustments to current regimen.   We will continue to closely follow  and support. All questions answered and support provided.    Assessment & Plan Pain in tailbone area Pain primarily localized to the tailbone area, managed with oxycodone  as needed. No current issues with pain management reported. - Refill prescription for oxycodone  - Ensure availability of oxycodone  at the pharmacy for pickup as needed  Insomnia Persistent insomnia requiring medication management. Current regimen includes taking one to two tablets as needed, leading to depletion of medication supply. - Refill prescription for insomnia medication  Diarrhea Intermittent diarrhea occurring during medication off weeks. No current use of antidiarrheal medication, but previous use of Imodium was noted during a hospital visit. - Consider starting an antidiarrheal regimen such as Imodium during medication off weeks  Tingling in arm Tingling in the arm, likely neuropathic in nature. Recent consultation with a neurologist did not reveal any abnormalities. Scheduled for an MRI to further investigate the cause. - Proceed with scheduled MRI on July 31st per attending.  -Continue gabapentin  100mg  as needed.   I will plan to see patient back in 3-4  weeks. Sooner if needed.   Patient expressed understanding and was in agreement with this plan. She also understands that She can call the clinic at any time with any questions, concerns, or complaints.   Any controlled substances utilized were prescribed in the context of palliative care. PDMP has been reviewed.   Visit consisted of counseling and education dealing with the complex and emotionally intense issues of symptom management and palliative care in the setting of serious and potentially life-threatening illness.  Levon Borer, AGPCNP-BC  Palliative Medicine Team/[facility]

## 2023-09-16 NOTE — Telephone Encounter (Signed)
 Scheduled appointments per 7/24 los. Talked with the patient and she is aware of the made appointments.

## 2023-09-17 LAB — CANCER ANTIGEN 27.29: CA 27.29: 371.8 U/mL — ABNORMAL HIGH (ref 0.0–38.6)

## 2023-09-19 ENCOUNTER — Encounter: Payer: Self-pay | Admitting: Nurse Practitioner

## 2023-09-19 ENCOUNTER — Encounter: Payer: Self-pay | Admitting: Hematology

## 2023-09-23 ENCOUNTER — Ambulatory Visit
Admission: RE | Admit: 2023-09-23 | Discharge: 2023-09-23 | Disposition: A | Discharge status: Home / Self Care | Source: Ambulatory Visit | Attending: Neurology | Admitting: Neurology

## 2023-09-23 DIAGNOSIS — C50811 Malignant neoplasm of overlapping sites of right female breast: Secondary | ICD-10-CM | POA: Diagnosis not present

## 2023-09-23 DIAGNOSIS — R202 Paresthesia of skin: Secondary | ICD-10-CM | POA: Diagnosis not present

## 2023-09-23 DIAGNOSIS — Z170 Estrogen receptor positive status [ER+]: Secondary | ICD-10-CM | POA: Diagnosis not present

## 2023-09-23 MED ORDER — GADOPICLENOL 0.5 MMOL/ML IV SOLN
5.0000 mL | Freq: Once | INTRAVENOUS | Status: AC | PRN
  Administered 2023-09-23: 5 mL via INTRAVENOUS

## 2023-09-29 ENCOUNTER — Other Ambulatory Visit: Payer: Self-pay | Admitting: Nurse Practitioner

## 2023-09-29 DIAGNOSIS — Z170 Estrogen receptor positive status [ER+]: Secondary | ICD-10-CM

## 2023-10-13 ENCOUNTER — Inpatient Hospital Stay: Admitting: Nurse Practitioner

## 2023-10-13 ENCOUNTER — Inpatient Hospital Stay: Attending: Nurse Practitioner

## 2023-10-13 ENCOUNTER — Inpatient Hospital Stay: Admitting: Hematology

## 2023-10-13 ENCOUNTER — Encounter: Payer: Self-pay | Admitting: Hematology

## 2023-10-13 VITALS — BP 129/72 | HR 98 | Temp 98.0°F | Resp 16 | Ht 65.0 in | Wt 116.3 lb

## 2023-10-13 DIAGNOSIS — Z800 Family history of malignant neoplasm of digestive organs: Secondary | ICD-10-CM | POA: Diagnosis not present

## 2023-10-13 DIAGNOSIS — R748 Abnormal levels of other serum enzymes: Secondary | ICD-10-CM | POA: Diagnosis not present

## 2023-10-13 DIAGNOSIS — Z170 Estrogen receptor positive status [ER+]: Secondary | ICD-10-CM | POA: Diagnosis not present

## 2023-10-13 DIAGNOSIS — Z79811 Long term (current) use of aromatase inhibitors: Secondary | ICD-10-CM | POA: Insufficient documentation

## 2023-10-13 DIAGNOSIS — C50811 Malignant neoplasm of overlapping sites of right female breast: Secondary | ICD-10-CM | POA: Insufficient documentation

## 2023-10-13 DIAGNOSIS — Z9013 Acquired absence of bilateral breasts and nipples: Secondary | ICD-10-CM | POA: Insufficient documentation

## 2023-10-13 DIAGNOSIS — Z87891 Personal history of nicotine dependence: Secondary | ICD-10-CM | POA: Insufficient documentation

## 2023-10-13 DIAGNOSIS — Z1721 Progesterone receptor positive status: Secondary | ICD-10-CM | POA: Diagnosis not present

## 2023-10-13 DIAGNOSIS — C50011 Malignant neoplasm of nipple and areola, right female breast: Secondary | ICD-10-CM

## 2023-10-13 DIAGNOSIS — Z79899 Other long term (current) drug therapy: Secondary | ICD-10-CM | POA: Insufficient documentation

## 2023-10-13 DIAGNOSIS — Z1732 Human epidermal growth factor receptor 2 negative status: Secondary | ICD-10-CM | POA: Diagnosis not present

## 2023-10-13 DIAGNOSIS — R978 Other abnormal tumor markers: Secondary | ICD-10-CM | POA: Diagnosis not present

## 2023-10-13 DIAGNOSIS — C773 Secondary and unspecified malignant neoplasm of axilla and upper limb lymph nodes: Secondary | ICD-10-CM | POA: Insufficient documentation

## 2023-10-13 DIAGNOSIS — C7951 Secondary malignant neoplasm of bone: Secondary | ICD-10-CM | POA: Diagnosis present

## 2023-10-13 LAB — CMP (CANCER CENTER ONLY)
ALT: 221 U/L — ABNORMAL HIGH (ref 0–44)
AST: 212 U/L (ref 15–41)
Albumin: 4.1 g/dL (ref 3.5–5.0)
Alkaline Phosphatase: 96 U/L (ref 38–126)
Anion gap: 5 (ref 5–15)
BUN: 17 mg/dL (ref 8–23)
CO2: 31 mmol/L (ref 22–32)
Calcium: 8.9 mg/dL (ref 8.9–10.3)
Chloride: 104 mmol/L (ref 98–111)
Creatinine: 0.76 mg/dL (ref 0.44–1.00)
GFR, Estimated: >60 mL/min (ref >60)
Glucose, Bld: 82 mg/dL (ref 70–99)
Potassium: 4.5 mmol/L (ref 3.5–5.1)
Sodium: 140 mmol/L (ref 135–145)
Total Bilirubin: 1 mg/dL (ref 0.0–1.2)
Total Protein: 6.1 g/dL — ABNORMAL LOW (ref 6.5–8.1)

## 2023-10-13 LAB — CBC WITH DIFFERENTIAL (CANCER CENTER ONLY)
Abs Immature Granulocytes: 0 K/uL (ref 0.00–0.07)
Basophils Absolute: 0.1 K/uL (ref 0.0–0.1)
Basophils Relative: 6 %
Eosinophils Absolute: 0.1 K/uL (ref 0.0–0.5)
Eosinophils Relative: 2 %
HCT: 37.4 % (ref 36.0–46.0)
Hemoglobin: 13 g/dL (ref 12.0–15.0)
Immature Granulocytes: 0 %
Lymphocytes Relative: 49 %
Lymphs Abs: 1.2 K/uL (ref 0.7–4.0)
MCH: 34.5 pg — ABNORMAL HIGH (ref 26.0–34.0)
MCHC: 34.8 g/dL (ref 30.0–36.0)
MCV: 99.2 fL (ref 80.0–100.0)
Monocytes Absolute: 0.1 K/uL (ref 0.1–1.0)
Monocytes Relative: 6 %
Neutro Abs: 0.9 K/uL — ABNORMAL LOW (ref 1.7–7.7)
Neutrophils Relative %: 37 %
Platelet Count: 184 K/uL (ref 150–400)
RBC: 3.77 MIL/uL — ABNORMAL LOW (ref 3.87–5.11)
RDW: 17.3 % — ABNORMAL HIGH (ref 11.5–15.5)
WBC Count: 2.3 K/uL — ABNORMAL LOW (ref 4.0–10.5)
nRBC: 0 % (ref 0.0–0.2)

## 2023-10-13 MED ORDER — RIBOCICLIB SUCC (600 MG DOSE) 200 MG PO TBPK: 400.0000 mg | ORAL_TABLET | Freq: Every day | ORAL | 1 refills | Status: DC

## 2023-10-13 MED ORDER — PALBOCICLIB 100 MG PO CAPS: 100.0000 mg | ORAL_CAPSULE | Freq: Every day | ORAL | 0 refills | Status: DC

## 2023-10-13 NOTE — Progress Notes (Signed)
 [facility]   [phone] [phone]   Clinic Follow up Note   Patient Care Team: Royden Katie Czar, FNP as PCP - General (Internal Medicine) Aplington, [name], MD (Inactive) (Orthopedic Surgery) [name], MD (Ophthalmology) [name], MD (Dermatology) [name], MD as Consulting Physician (Hematology) [name], MD as Consulting Physician (General Surgery) [name], MD as Consulting Physician (Radiation Oncology) [name], [name], NP as Nurse Practitioner (Hematology and Oncology) Pickenpack-Cousar, [name], NP as Nurse Practitioner (Hospice and Palliative Medicine) Patel, [name], DO as Consulting Physician (Neurology)  Date of Service:  10/13/2023  CHIEF COMPLAINT: f/u of metastatic breast cancer  CURRENT THERAPY:  Anastrozole  and ribociclib   Oncology History   Malignant neoplasm of overlapping sites of right breast in female, estrogen receptor positive (HCC)  mpT2 pN1a, stage IA, G1,  ER positive, PR positive, HER-2 negative, mammaprint low risk luminal type A, node and bone metastasis in 03/2023 -Diagnosed in 02/2016. S/p bilateral mastectomy with reconstruction and adjuvant radiation. (Implants ultimately removed 01/24/19 by Dr. Arelia.) -She started antiestrogen therapy Exemestane  on 04/01/16, planned for 7 years. She experienced joint pain and hot flashes on exemestane , anastrozole , and tamoxifen . She opted to continue anastrozole . Given her continued difficulty with joint pain, as well as her low risk mammaprint, she stopped after she completed 5 years therapy in 03/2021 -she developed low appetite and weight loss in Feb 2025, CT and bone scan showed bone lesions. PET on 06/01/2023 showed diffuse hypermetabolic bone lesions and hypermetabolic small lymph node in the right axillary region and supraclavicular region on the right low neck, concerning for metastatic disease. -[place] node biopsy on 06/22/2023 confirmed  metastatic breast cancer, will request NGS  -she started anastrozole  in 05/2023, Ribciclib and zometa  was added in May 2025  Assessment & Plan Metastatic breast cancer with bone metastases and bone pain Metastatic breast cancer with bone metastases, currently on anastrozole  and ribociclib  with reported improvement in bone pain, particularly in the tailbone, suggesting a positive response to treatment. - Order whole body scan in September to evaluate response to therapy - Continue anastrozole  and ribociclib  - Continue calcium and vitamin D  supplementation - Follow up in five weeks for PET scan, lab work, and infusion  Adverse effects of antineoplastic therapy (Kisqali) including nausea, fatigue, skin rash, and gastrointestinal symptoms Experiencing significant adverse effects from [place], including major nausea, fatigue, skin rash, and gastrointestinal symptoms (constipation and diarrhea). The severity of these side effects warrants a change in medication. Discussed alternative medications, including Ibrance , which has fewer side effects and is generally better tolerated. She expressed desire to switch medications due to adverse effects and will undergo a two-week break before starting Ibrance . - Discontinue Kisqali - Initiate Ibrance  at 100 mg once daily, three weeks on, one week off, after a 2-3-week break from [place] - Monitor for side effects and adjust dose as needed - [facility]  prescription - Evaluate liver enzymes before starting Ibrance   Elevated liver enzymes Elevated liver enzymes noted, with AST at 212. Possible contributors include alcohol use and medication effects. Liver enzymes have improved slightly with reduced alcohol intake. - Advise further reduction in alcohol consumption - Recheck liver enzymes in two weeks  Alcohol use disorder Reports reduced alcohol consumption to two to three beers per day, down from higher levels. Continued  reduction is advised to improve liver function and overall health. - Advise further reduction in alcohol consumption, aiming for a few drinks per week rather  than daily  Plan - Due to side effect from ribociclib , especially nausea, skin rash, and transaminitis, we will stop - Will repeat labs in 3 weeks, if liver enzymes improved, will start Ibrance  at 100 mg daily for 3 weeks on 1 week off, prescription called in today - Follow-up in 4 weeks with lab and restaging PET scan 1 week before   SUMMARY OF ONCOLOGIC HISTORY: Oncology History Overview Note  Cancer Staging Malignant neoplasm of overlapping sites of right breast in female, estrogen receptor positive (HCC) Staging form: Breast, AJCC 8th Edition - Clinical stage from 03/23/2016: Stage IIA (cT3, cN1, cM0, G2, ER: Positive, PR: Positive, HER2: Negative) - Signed by Onita Mattock, MD on 03/31/2016 - Pathologic stage from 05/19/2016: Stage IA (pT2(m), pN1a, cM0, G1, ER: Positive, PR: Positive, HER2: Negative) - Signed by Onita Mattock, MD on 06/18/2016     Malignant neoplasm of overlapping sites of right breast in female, estrogen receptor positive (HCC)  03/17/2016 Mammogram   B/l diagnostic mammogram and righ US  showed a 3.6cm irregular mass in the right breast 11:00 position, posterior depth, there is a enlarged lymph node in the right axilla is highly suspicious for malignancy. additional 7 mm oval mass in the right breast lower outer quadrant is suspicious for malignancy.   03/23/2016 Initial Biopsy   Right breast 9:30 position biopsy showed invasive ductal carcinoma, grade 1. 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   03/23/2016 Receptors her2   Both breast and node biopsy tumor ER 100% positive, PR 70-95% positive, HER-2 negative, Ki-67 40%   03/23/2016 Initial Diagnosis   Malignant neoplasm of upper-outer quadrant of right breast in female, estrogen receptor positive (HCC)   03/25/2016 Initial Biopsy   Right breast  11:00 position core needle biopsy showed invasive duct carcinoma, grade 2.    03/25/2016 Receptors her2   ER 95% positive, PR 90% positive, HER-2 negative, Ki-67 15%   03/25/2016 Miscellaneous   Mammaprint showed low risk type A with index +0.105   03/30/2016 Imaging   Bilateral breast MRI with and without contrast showed a large lobulated enhancing mass within the upper-outer and lower outer right breast with surrounding nodularity, measuring 6.1 x 4.4 x 5.6 cm. Multiple critically sick and right axillary lymph nodes are demonstrated measuring up to 1.5 cm.    04/01/2016 -  Anti-estrogen oral therapy   Exemestane  25 mg daily, plan for 7 years. Switched to Anastrozole  1mg  in 06/2017 due to joint pain and hot flashes. Due to persistent side effects I changed her to Tamoxifen  in 09/2018. She stopped Tamoxifen  and switched back to anastrozole  in 01/2019 because it was more tolerable.    05/19/2016 Surgery   Bilateral mastectomy and right axillary regional lymph node resection.   05/19/2016 Pathology Results   -Right axillary regional lymph node resection revealed metastatic carcinoma in 2/7 lymph nodes. -Left simple mastectomy revealed lobular neoplasia and fibrocystic changes with adenosis and calcifications. 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 -Skin of the right mastectomy flap was benign. -mpT2, pN1a   06/25/2016 - 08/11/2016 Radiation Therapy   Site/dose:    1. 4 field Right breast was treated to 50.4 Gy in 25 fractions at 1.8 Gy per fraction. 2. The Right breast was boosted to 10 Gy in 5 fractions at 2 Gy per fraction.   04/15/2017 Genetic Testing  The patient had genetic testing due to a personal history of breast cancer and family history of breast, ovarian, and colon cancer. The  Common Hereditary Cancer Panel was ordered.  The Common Hereditary Cancer Panel offered by [facility] includes sequencing and/or deletion duplication testing of the following 47 genes: APC, ATM, AXIN2, BARD1, BMPR1A, BRCA1, BRCA2, BRIP1, CDH1, CDKN2A (p14ARF), CDKN2A (p16INK4a), CKD4, CHEK2, CTNNA1, DICER1, EPCAM (Deletion/duplication testing only), GREM1 (promoter region deletion/duplication testing only), KIT, MEN1, MLH1, MSH2, MSH3, MSH6, MUTYH, NBN, NF1, NHTL1, PALB2, PDGFRA, PMS2, POLD1, POLE, PTEN, RAD50, RAD51C, RAD51D, SDHB, SDHC, SDHD, SMAD4, SMARCA4. STK11, TP53, TSC1, TSC2, and VHL.  The following genes were evaluated for sequence changes only: SDHA and HOXB13 c.251G>A variant only.  Results: Negative, no pathogenic variants identified. The date of this test report is 04/15/2017   06/28/2017 Surgery   COMPLEX REVISION OF BACK SCAR and LEFT BREAST RECONSTRUCTION WITH SILICONE IMPLANT EXCHANGE AND ACELLULARDERMIS TO LEFT CHEST by Dr. Arelia 06/28/17   06/13/2023 Pathology Results   FINAL MICROSCOPIC DIAGNOSIS: A. LYMPH NODE, BIOPSY: -  Metastatic carcinoma morphologically consistent with the patient's known breast primary. ADDENDUM:  An immunohistochemical stain for GATA3 is positive supporting the morphological and clinical impression of breast primary. ADDENDUM: Lymph node, biopsy - Metastatic carcinoma  PROGNOSTIC INDICATORS Results:  IMMUNOHISTOCHEMICAL AND MORPHOMETRIC ANALYSIS PERFORMED MANUALLY  The tumor cells are negative for Her2 (1+). Estrogen Receptor:  70%, positive, strong staining intensity Progesterone Receptor:  80%, positive, strong staining intensity Proliferation Marker Ki67: 20% COMMENT:  The negative hormone receptor study(ies) in this case has an internal positive control.  REFERENCE RANGE ESTROGEN RECEPTOR        NEGATIVE     0%        POSITIVE       =>1% REFERENCE RANGE PROGESTERONE RECEPTOR        NEGATIVE     0%        POSITIVE        =>1% All controls  stained appropriately        Discussed the use of AI scribe software for clinical note transcription with the patient, who gave verbal consent to proceed.  History of Present Illness [name] is a 63 year old female with metastatic breast cancer who presents for follow-up.  She experiences significant nausea and fatigue since starting Kisqali three months ago, with nausea severe enough to require her to lay down. She has lost four pounds over the past two months. Gastrointestinal symptoms include both constipation and diarrhea, with diarrhea occurring after the last pill before the break and constipation requiring a fleet enema.  A skin rash has developed on her legs and chest, characterized by redness, splotchiness, itching, and dryness. Her mouth is also dry, necessitating constant water intake. She does not experience joint pain or hot flashes, which were present with previous treatments.  She consumes two to three beers daily, reduced from previous levels. Liver enzymes are elevated, with AST at 212.     All other systems were reviewed with the patient and are negative.  MEDICAL HISTORY:  Past Medical History:  Diagnosis Date   Anxiety    Anxiety disorder    Asthma    h/o asthma as a child   Breast cancer (HCC)    Cancer (HCC) 04/2016   right breast cancer   Cholelithiasis    Chronic kidney disease    obstruction of R kidney, ( not a stone) - currently resolved    Depression  Diverticulosis    DJD (degenerative joint disease)    hands & back    Dyspnea    resolved since she stopped smoking    Epigastric abdominal pain    Esophageal stricture    Family history of adverse reaction to anesthesia    daughter has N&V, takes long time to wake up    Family history of colon cancer    Family history of ovarian cancer    Family hx of colon cancer    Female pelvic peritoneal adhesions 10/26/2012   Ganglion cyst    GERD (gastroesophageal reflux disease)     Headache    low grade currently , family history of migraines    Hemorrhoid    History of radiation therapy 05/03 - 08/11/2016   1. 4 field Right breast was treated to 50.4 Gy in 25 fractions at 1.8 Gy per fraction. 2. The Right breast was boosted to 10 Gy in 5 fractions at 2 Gy per fraction.   Hypertension    Hypothyroidism    Plantar fasciitis, bilateral    PONV (postoperative nausea and vomiting)    gets anxious with the mask on her face, also remarks that the scop. patch has helped in the past       SURGICAL HISTORY: Past Surgical History:  Procedure Laterality Date   ABDOMINAL HYSTERECTOMY     BREAST IMPLANT REMOVAL Bilateral 01/24/2019   Procedure: REMOVAL BREAST IMPLANTS;  Surgeon: Arelia Filippo, MD;  Location: [facility];  Service: Plastics;  Laterality: Bilateral;   BREAST RECONSTRUCTION WITH PLACEMENT OF TISSUE EXPANDER AND FLEX HD (ACELLULAR HYDRATED DERMIS) Bilateral 05/19/2016   Procedure: BREAST RECONSTRUCTION WITH PLACEMENT OF TISSUE EXPANDER AND ALLODERM PLACEMENT;  Surgeon: Filippo Arelia, MD;  Location: [facility];  Service: Plastics;  Laterality: Bilateral;   BREAST RECONSTRUCTION WITH PLACEMENT OF TISSUE EXPANDER AND FLEX HD (ACELLULAR HYDRATED DERMIS) Left 06/28/2017   Procedure: LEFT BREAST RECONSTRUCTION WITH SILICONE IMPLANT EXCHANGE AND ACELLULARDERMIS TO LEFT CHEST;  Surgeon: Arelia Filippo, MD;  Location: [facility];  Service: Plastics;  Laterality: Left;   CAPSULECTOMY Bilateral 01/24/2019   Procedure: CAPSULECTOMY;  Surgeon: Arelia Filippo, MD;  Location: [facility];  Service: Plastics;  Laterality: Bilateral;   CESAREAN SECTION  1988   CHOLECYSTECTOMY     HEMORRHOID SURGERY     LAPAROSCOPIC BILATERAL SALPINGECTOMY N/A 10/26/2012   Procedure: operative laparoscopy with lysis of adhesions;  Surgeon: Ezzie Marshall, MD;  Location: [facility];  Service: Gynecology;  Laterality: N/A;   LESION EXCISION  WITH COMPLEX REPAIR Right 06/09/2019   Procedure: COMPLEX REPAIR RIGHT CHEST 15CM;  Surgeon: Arelia Filippo, MD;  Location: [facility];  Service: Plastics;  Laterality: Right;   MASTECTOMY Bilateral    MASTECTOMY WITH RADIOACTIVE SEED GUIDED EXCISION AND AXILLARY SENTINEL LYMPH NODE BIOPSY Bilateral 05/19/2016   Procedure: RIGHT SKIN SPARING MASTECTOMY WITH RIGHT RADIOACTIVE SEED TARGETED DISSECTION AND RIGHT SENTINEL LYMPH NODE BIOPSY, LEFT PROPHYLACTIC SKIN SPARING MASTECTOMY;  Surgeon: Donnice Bury, MD;  Location: [facility];  Service: General;  Laterality: Bilateral;   REMOVAL OF BILATERAL TISSUE EXPANDERS WITH PLACEMENT OF BILATERAL BREAST IMPLANTS Bilateral 01/29/2017   Procedure: REMOVAL OF BILATERAL TISSUE EXPANDERS WITH PLACEMENT OF BILATERAL SILICONE BREAST IMPLANTS, ALLODERM TO LEFT BREAST RECONSTRUCTION;  RIGHT LATISSUMUS FLAP;  Surgeon: Arelia Filippo, MD;  Location: [facility];  Service: Plastics;  Laterality: Bilateral;  Requesting RNFA   SCAR REVISION Right 06/28/2017   Procedure: COMPLEX REVISION OF BACK  SCAR;  Surgeon: Arelia Filippo, MD;  Location: [facility];  Service: Plastics;  Laterality: Right;   TONSILLECTOMY     TUBAL LIGATION     urologic surgery for ureteropelvic junction obstruction      I have reviewed the social history and family history with the patient and they are unchanged from previous note.  ALLERGIES:  is allergic to adhesive [tape], promethazine hcl, and sulfa  antibiotics.  MEDICATIONS:  Current Outpatient Medications  Medication Sig Dispense Refill   palbociclib  (IBRANCE ) 100 MG capsule Take 1 capsule (100 mg total) by mouth daily with breakfast. Take whole with food. Take for 21 days on, 7 days off, repeat every 28 days. 21 capsule 0   ALPRAZolam  (XANAX ) 1 MG tablet Take 1 tablet by mouth 3 (three) times daily.     anastrozole  (ARIMIDEX ) 1 MG tablet TAKE 1 TABLET BY MOUTH EVERY DAY 90 tablet 1    celecoxib  (CELEBREX ) 200 MG capsule Take 1 capsule (200 mg total) by mouth daily. 30 capsule 2   cloNIDine  (CATAPRES ) 0.1 MG tablet TAKE 1 TABLET BY MOUTH EVERY DAY 90 tablet 1   gabapentin  (NEURONTIN ) 100 MG capsule Take 1 capsule every day by oral route at dinner. (Patient taking differently: Take 100 mg by mouth as needed.)     levothyroxine  (SYNTHROID , LEVOTHROID) 50 MCG tablet Take 50 mcg by mouth daily before breakfast.     omeprazole  (PRILOSEC) 40 MG capsule TAKE 1 CAPSULE BY MOUTH TWICE A DAY 180 capsule 2   ondansetron  (ZOFRAN ) 8 MG tablet TAKE 1 TABLET (8 MG TOTAL) BY MOUTH EVERY 4 (FOUR) HOURS AS NEEDED FOR NAUSEA OR VOMITING. 135 tablet 1   oxyCODONE -acetaminophen  (PERCOCET/ROXICET) 5-325 MG tablet Take 1 tablet by mouth every 6 (six) hours as needed for severe pain (pain score 7-10). 45 tablet 0   potassium chloride  (KLOR-CON  M) 10 MEQ tablet Take 1 tablet (10 mEq total) by mouth 2 (two) times daily. 180 tablet 1   prochlorperazine  (COMPAZINE ) 10 MG tablet Take 1 tablet (10 mg total) by mouth every 6 (six) hours as needed for nausea or vomiting. 60 tablet 0   venlafaxine  XR (EFFEXOR -XR) 150 MG 24 hr capsule TAKE 1 CAPSULE BY MOUTH EVERY DAY WITH BREAKFAST 90 capsule 1   venlafaxine  XR (EFFEXOR -XR) 75 MG 24 hr capsule TAKE 1 CAPSULE BY MOUTH DAILY WITH BREAKFAST. 90 capsule 2   zolpidem  (AMBIEN ) 5 MG tablet Take 1-2 tablets (5-10 mg total) by mouth at bedtime as needed for sleep. 90 tablet 1   No current facility-administered medications for this visit.    PHYSICAL EXAMINATION: ECOG PERFORMANCE STATUS: 1 - Symptomatic but completely ambulatory  Vitals:   10/13/23 1540  BP: 129/72  Pulse: 98  Resp: 16  Temp: 98 F (36.7 C)  SpO2: 100%   Wt Readings from Last 3 Encounters:  10/13/23 116 lb 4.8 oz (52.8 kg)  09/16/23 118 lb 6.4 oz (53.7 kg)  08/24/23 120 lb (54.4 kg)     GENERAL:alert, no distress and comfortable SKIN: skin color, texture, turgor are normal, no rashes or  significant lesions EYES: normal, Conjunctiva are pink and non-injected, sclera clear NECK: supple, thyroid  normal size, non-tender, without nodularity LYMPH:  no palpable lymphadenopathy in the cervical, axillary  LUNGS: clear to auscultation and percussion with normal breathing effort HEART: regular rate & rhythm and no murmurs and no lower extremity edema ABDOMEN:abdomen soft, non-tender and normal bowel sounds Musculoskeletal:no cyanosis of digits and no clubbing  NEURO: alert &  oriented x 3 with fluent speech, no focal motor/sensory deficits  Physical Exam SKIN: Redness and splotchiness on legs and chest, with itchy, dry skin.  LABORATORY DATA:  I have reviewed the data as listed    Latest Ref Rng & Units 10/13/2023    3:01 PM 09/16/2023   12:21 PM 08/19/2023    8:29 AM  CBC  WBC 4.0 - 10.5 K/uL 2.3  2.4  2.1   Hemoglobin 12.0 - 15.0 g/dL 86.9  86.5  85.9   Hematocrit 36.0 - 46.0 % 37.4  39.3  40.6   Platelets 150 - 400 K/uL 184  222  214         Latest Ref Rng & Units 10/13/2023    3:01 PM 09/16/2023   12:21 PM 08/19/2023    8:29 AM  CMP  Glucose 70 - 99 mg/dL 82  88  899   BUN 8 - 23 mg/dL 17  14  7    Creatinine 0.44 - 1.00 mg/dL 9.23  9.09  9.08   Sodium 135 - 145 mmol/L 140  140  142   Potassium 3.5 - 5.1 mmol/L 4.5  4.7  3.2   Chloride 98 - 111 mmol/L 104  103  102   CO2 22 - 32 mmol/L 31  31  30    Calcium 8.9 - 10.3 mg/dL 8.9  8.8  9.1   Total Protein 6.5 - 8.1 g/dL 6.1  6.5  6.5   Total Bilirubin 0.0 - 1.2 mg/dL 1.0  0.7  0.6   Alkaline Phos 38 - 126 U/L 96  70  66   AST 15 - 41 U/L 212  103  53   ALT 0 - 44 U/L 221  111  55       RADIOGRAPHIC STUDIES: I have personally reviewed the radiological images as listed and agreed with the findings in the report. No results found.    Orders Placed This Encounter  Procedures   NM PET Image Restag (PS) Skull Base To Thigh    Standing Status:   Future    Expected Date:   11/10/2023    Expiration Date:    10/12/2024    If indicated for the ordered procedure, I authorize the administration of a radiopharmaceutical per Radiology protocol:   Yes    Preferred imaging location?:   [facility]   All questions were answered. The patient knows to call the clinic with any problems, questions or concerns. No barriers to learning was detected. The total time spent in the appointment was 40 minutes, including review of chart and various tests results, discussions about plan of care and coordination of care plan     Onita Mattock, MD 10/13/2023

## 2023-10-13 NOTE — Assessment & Plan Note (Signed)
 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 S/p bilateral mastectomy with reconstruction and adjuvant radiation. (Implants ultimately removed 01/24/19 by Dr. Arelia.) -She started antiestrogen therapy Exemestane  on 04/01/16, planned for 7 years. She experienced joint pain and hot flashes on exemestane , anastrozole , and tamoxifen . She opted to continue anastrozole . Given her continued difficulty with joint pain, as well as her low risk mammaprint, she stopped after she completed 5 years therapy in 03/2021 -she developed low appetite and weight loss in Feb 2025, CT and bone scan showed bone lesions. PET on 06/01/2023 showed diffuse hypermetabolic bone lesions and hypermetabolic small lymph node in the right axillary region and supraclavicular region on the right low neck, concerning for metastatic disease. -[place] node biopsy on 06/22/2023 confirmed metastatic breast cancer, will request NGS  -she started anastrozole  in 05/2023, Ribciclib and zometa  was added in May 2025

## 2023-10-13 NOTE — Progress Notes (Signed)
 Critical lab value reported:  AST 212  other lab values: Tbili 1.0; ALT 221; Alk Phos 96.  Notified Dr. Lanny

## 2023-10-14 ENCOUNTER — Other Ambulatory Visit (HOSPITAL_COMMUNITY): Payer: Self-pay

## 2023-10-14 ENCOUNTER — Telehealth: Payer: Self-pay | Admitting: Pharmacist

## 2023-10-14 ENCOUNTER — Telehealth: Payer: Self-pay

## 2023-10-14 DIAGNOSIS — Z170 Estrogen receptor positive status [ER+]: Secondary | ICD-10-CM

## 2023-10-14 LAB — CANCER ANTIGEN 27.29: CA 27.29: 292.4 U/mL — ABNORMAL HIGH (ref 0.0–38.6)

## 2023-10-14 MED ORDER — PALBOCICLIB 100 MG PO TABS: 100.0000 mg | ORAL_TABLET | Freq: Every day | ORAL | 0 refills | Status: DC

## 2023-10-14 NOTE — Telephone Encounter (Signed)
 Oral Oncology Pharmacist Encounter  Received new prescription for Ibrance  (palbociclib ) for the treatment of metastatic ER/PR positive, HER-2 negative breast cancer in conjunction with anastrozole , planned duration until disease progression or unacceptable drug toxicity.  CBC w/ Diff and CMP from 10/13/23 assessed, WBC 2.3 K/uL (ANC 900), LFTs elevated (AST 212 U/L and ALT 221 U/L, t.bili 1 (trended up). Patient will take 2-3 week break off treatment before starting Ibrance  and have labs repeated before Ibrance  initiation.  Prescription dose and frequency assessed for appropriateness. Patient starting on dose reduced Ibrance  per MD.   Current medication list in Epic reviewed, DDIs with Ibrance  identified: Category C drug-drug interaction between Ibrance  and Xanax  - Ibrance  (weak CYP3A4 inhibitor) may increase serum concentrations of Xanax . Monitor for increased side effects from Xanax  (fatigue, sedation, etc.). No changes in therapy warranted at this time.   Evaluated chart and no patient barriers to medication adherence noted.   Patient's insurance requires Ibrance  be filled [facility].   Oral Oncology Clinic will continue to follow for insurance authorization, copayment issues, initial counseling and start date.  Asberry Macintosh, PharmD, BCPS, BCOP Hematology/Oncology Clinical Pharmacist [phone] 10/14/2023 9:01 AM

## 2023-10-14 NOTE — Telephone Encounter (Signed)
 Oral Oncology Patient Advocate Encounter   Received notification that prior authorization for Ibrance  is required.   PA submitted on 10/14/23 Key [id number] Status is pending      Charlott Hamilton,  CPhT-Adv  she/her/hers [facility]  [facility] Pharmacy Technician Patient Advocate Specialist III [facility] Phone: [phone]  Fax: [phone] [email or web address]

## 2023-10-14 NOTE — Telephone Encounter (Signed)
 Oral Oncology Patient Advocate Encounter  Prior Authorization for Ibrance  has been approved.    PA# [id number] Effective dates: 10/14/23 through 10/13/24  Patient must fill [facility].   Charlott Hamilton,  CPhT-Adv  she/her/hers [facility]  [facility] Pharmacy Technician Patient Advocate Specialist III [facility] Phone: [phone]  Fax: [phone] [email or web address]

## 2023-10-16 ENCOUNTER — Other Ambulatory Visit: Payer: Self-pay | Admitting: Nurse Practitioner

## 2023-10-19 ENCOUNTER — Other Ambulatory Visit: Payer: Self-pay

## 2023-10-19 NOTE — Telephone Encounter (Signed)
 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  Treatment goal: Palliative  Counseled patient on administration, dosing, side effects, monitoring, drug-food interactions, safe handling, storage, and disposal.  Patient will take Ibrance  100mg  tablets, 1 tablet by mouth once daily, with or without food, taken for 3 weeks on, 1 week off, and repeated.  Patient knows to avoid grapefruit and grapefruit juice while on treatment with Ibrance .  Ibrance  start date: 10/28/23 (OK per Dr. Lanny via staff message for patient to start once she receives Ibrance  instead of waiting for repeat labs on 11/08/23)  Ibrance  is scheduled to deliver to patient ~10/26/23 from [facility].   Adverse effects include but are not limited to: fatigue, GI upset, nausea, decreased blood counts. N/V: Patient has zofran  to use PRN for N/V. She plans on taking this 30-60 minutes prior to Ibrance  for the first cycle to prevent N/V as she has had issues with nausea on previous regimens. Diarrhea: Patient will obtain anti diarrheal and alert the office of 4 or more loose stools above baseline.  Reviewed with patient importance of keeping a medication schedule and plan for any missed doses. No barriers to medication adherence identified.  Medication reconciliation performed and medication/allergy list updated.  Distress thermometer flowsheet: Distress thermometer not completed during telephone call as patient has been on previous lines of therapy.   Communication and Learning Assessment Primary learner: patient Barriers to learning: No barriers Preferred language: English Learning preferences: Listening Reading  All questions answered.  [name] voiced understanding and appreciation.   Medication  education handout placed in mail for patient. Patient knows to call the office with questions or concerns. Oral Chemotherapy Clinic phone number provided to patient.   [name], PharmD, BCPS, BCOP Hematology/Oncology Clinical Pharmacist [phone] 10/19/2023 10:10 AM

## 2023-10-20 ENCOUNTER — Telehealth: Payer: Self-pay | Admitting: Nurse Practitioner

## 2023-10-20 NOTE — Telephone Encounter (Signed)
 Called and spoke with pt regarding her  upcoming appointments and she aware of her appts.

## 2023-10-26 ENCOUNTER — Other Ambulatory Visit: Payer: Self-pay | Admitting: Hematology

## 2023-10-26 DIAGNOSIS — C50811 Malignant neoplasm of overlapping sites of right female breast: Secondary | ICD-10-CM

## 2023-10-27 ENCOUNTER — Encounter: Payer: Self-pay | Admitting: Hematology

## 2023-10-28 ENCOUNTER — Telehealth: Payer: Self-pay | Admitting: Nurse Practitioner

## 2023-10-28 NOTE — Telephone Encounter (Signed)
 Scheduled patient for Palliative care. Spoke with the patient and she is aware of the day and time.

## 2023-11-08 ENCOUNTER — Encounter (HOSPITAL_COMMUNITY)
Admission: RE | Admit: 2023-11-08 | Discharge: 2023-11-08 | Disposition: A | Discharge status: Home / Self Care | Source: Ambulatory Visit | Attending: Hematology | Admitting: Hematology

## 2023-11-08 ENCOUNTER — Inpatient Hospital Stay

## 2023-11-08 ENCOUNTER — Telehealth: Payer: Self-pay

## 2023-11-08 ENCOUNTER — Other Ambulatory Visit

## 2023-11-08 ENCOUNTER — Inpatient Hospital Stay: Attending: Nurse Practitioner

## 2023-11-08 VITALS — BP 133/78 | HR 84 | Temp 97.8°F | Resp 16

## 2023-11-08 DIAGNOSIS — C50811 Malignant neoplasm of overlapping sites of right female breast: Secondary | ICD-10-CM | POA: Diagnosis not present

## 2023-11-08 DIAGNOSIS — Z791 Long term (current) use of non-steroidal anti-inflammatories (NSAID): Secondary | ICD-10-CM | POA: Diagnosis not present

## 2023-11-08 DIAGNOSIS — C50411 Malignant neoplasm of upper-outer quadrant of right female breast: Secondary | ICD-10-CM | POA: Diagnosis not present

## 2023-11-08 DIAGNOSIS — Z1721 Progesterone receptor positive status: Secondary | ICD-10-CM | POA: Diagnosis not present

## 2023-11-08 DIAGNOSIS — R978 Other abnormal tumor markers: Secondary | ICD-10-CM | POA: Diagnosis not present

## 2023-11-08 DIAGNOSIS — C773 Secondary and unspecified malignant neoplasm of axilla and upper limb lymph nodes: Secondary | ICD-10-CM | POA: Diagnosis not present

## 2023-11-08 DIAGNOSIS — Z79811 Long term (current) use of aromatase inhibitors: Secondary | ICD-10-CM | POA: Diagnosis not present

## 2023-11-08 DIAGNOSIS — C50011 Malignant neoplasm of nipple and areola, right female breast: Secondary | ICD-10-CM

## 2023-11-08 DIAGNOSIS — Z7983 Long term (current) use of bisphosphonates: Secondary | ICD-10-CM | POA: Diagnosis not present

## 2023-11-08 DIAGNOSIS — Z170 Estrogen receptor positive status [ER+]: Secondary | ICD-10-CM

## 2023-11-08 DIAGNOSIS — Z79899 Other long term (current) drug therapy: Secondary | ICD-10-CM | POA: Insufficient documentation

## 2023-11-08 DIAGNOSIS — M816 Localized osteoporosis [Lequesne]: Secondary | ICD-10-CM

## 2023-11-08 DIAGNOSIS — M810 Age-related osteoporosis without current pathological fracture: Secondary | ICD-10-CM | POA: Insufficient documentation

## 2023-11-08 DIAGNOSIS — C7951 Secondary malignant neoplasm of bone: Secondary | ICD-10-CM | POA: Diagnosis not present

## 2023-11-08 DIAGNOSIS — Z1732 Human epidermal growth factor receptor 2 negative status: Secondary | ICD-10-CM | POA: Insufficient documentation

## 2023-11-08 DIAGNOSIS — R911 Solitary pulmonary nodule: Secondary | ICD-10-CM | POA: Diagnosis not present

## 2023-11-08 LAB — CMP (CANCER CENTER ONLY)
ALT: 205 U/L — ABNORMAL HIGH (ref 0–44)
AST: 184 U/L (ref 15–41)
Albumin: 4.3 g/dL (ref 3.5–5.0)
Alkaline Phosphatase: 110 U/L (ref 38–126)
Anion gap: 6 (ref 5–15)
BUN: 11 mg/dL (ref 8–23)
CO2: 30 mmol/L (ref 22–32)
Calcium: 9.2 mg/dL (ref 8.9–10.3)
Chloride: 102 mmol/L (ref 98–111)
Creatinine: 0.79 mg/dL (ref 0.44–1.00)
GFR, Estimated: >60 mL/min (ref >60)
Glucose, Bld: 91 mg/dL (ref 70–99)
Potassium: 5 mmol/L (ref 3.5–5.1)
Sodium: 138 mmol/L (ref 135–145)
Total Bilirubin: 1.1 mg/dL (ref 0.0–1.2)
Total Protein: 6.8 g/dL (ref 6.5–8.1)

## 2023-11-08 LAB — CBC WITH DIFFERENTIAL (CANCER CENTER ONLY)
Abs Immature Granulocytes: 0.02 K/uL (ref 0.00–0.07)
Basophils Absolute: 0.1 K/uL (ref 0.0–0.1)
Basophils Relative: 3 %
Eosinophils Absolute: 0.1 K/uL (ref 0.0–0.5)
Eosinophils Relative: 4 %
HCT: 39.4 % (ref 36.0–46.0)
Hemoglobin: 13.9 g/dL (ref 12.0–15.0)
Immature Granulocytes: 1 %
Lymphocytes Relative: 41 %
Lymphs Abs: 1.5 K/uL (ref 0.7–4.0)
MCH: 35.3 pg — ABNORMAL HIGH (ref 26.0–34.0)
MCHC: 35.3 g/dL (ref 30.0–36.0)
MCV: 100 fL (ref 80.0–100.0)
Monocytes Absolute: 0.2 K/uL (ref 0.1–1.0)
Monocytes Relative: 6 %
Neutro Abs: 1.7 K/uL (ref 1.7–7.7)
Neutrophils Relative %: 45 %
Platelet Count: 264 K/uL (ref 150–400)
RBC: 3.94 MIL/uL (ref 3.87–5.11)
RDW: 12.6 % (ref 11.5–15.5)
Smear Review: NORMAL
WBC Count: 3.6 K/uL — ABNORMAL LOW (ref 4.0–10.5)
nRBC: 0 % (ref 0.0–0.2)

## 2023-11-08 LAB — GLUCOSE, CAPILLARY: Glucose-Capillary: 99 mg/dL (ref 70–99)

## 2023-11-08 MED ORDER — FLUDEOXYGLUCOSE F - 18 (FDG) INJECTION
5.8000 | Freq: Once | INTRAVENOUS | Status: AC
  Administered 2023-11-08: 5.8 via INTRAVENOUS

## 2023-11-08 MED ORDER — ZOLEDRONIC ACID 4 MG/100ML IV SOLN: 4.0000 mg | Freq: Once | INTRAVENOUS | Status: DC

## 2023-11-08 NOTE — Telephone Encounter (Signed)
 I reviewed her LFT; They are still too high, I do not recommend restart Ibrance  yet Repeat LFT in 2 weeks

## 2023-11-08 NOTE — Patient Instructions (Signed)

## 2023-11-08 NOTE — Telephone Encounter (Signed)
 Critical lab value reported:  AST 184 previous AST was 212.  Notified Powell Lessen, NP; Mayme Silversmith, NP, and Dr. Lonn.

## 2023-11-08 NOTE — Progress Notes (Signed)
 Pt arrived to infusion today from PET scan appointment with IV still in place per pt's request. Pt arrived for Zometa , but not due until December so IV removed and pt was sent home. Ambulatory to lobby.

## 2023-11-09 DIAGNOSIS — Z0000 Encounter for general adult medical examination without abnormal findings: Secondary | ICD-10-CM | POA: Diagnosis not present

## 2023-11-09 DIAGNOSIS — E559 Vitamin D deficiency, unspecified: Secondary | ICD-10-CM | POA: Diagnosis not present

## 2023-11-09 DIAGNOSIS — M8589 Other specified disorders of bone density and structure, multiple sites: Secondary | ICD-10-CM | POA: Diagnosis not present

## 2023-11-09 DIAGNOSIS — Z170 Estrogen receptor positive status [ER+]: Secondary | ICD-10-CM | POA: Diagnosis not present

## 2023-11-09 DIAGNOSIS — R7989 Other specified abnormal findings of blood chemistry: Secondary | ICD-10-CM | POA: Diagnosis not present

## 2023-11-09 LAB — CANCER ANTIGEN 27.29: CA 27.29: 280.7 U/mL — ABNORMAL HIGH (ref 0.0–38.6)

## 2023-11-10 ENCOUNTER — Other Ambulatory Visit: Payer: Self-pay | Admitting: Registered Nurse

## 2023-11-10 ENCOUNTER — Other Ambulatory Visit: Payer: Self-pay

## 2023-11-10 ENCOUNTER — Telehealth: Payer: Self-pay | Admitting: Nurse Practitioner

## 2023-11-10 ENCOUNTER — Telehealth: Payer: Self-pay

## 2023-11-10 ENCOUNTER — Other Ambulatory Visit

## 2023-11-10 DIAGNOSIS — R748 Abnormal levels of other serum enzymes: Secondary | ICD-10-CM

## 2023-11-10 NOTE — Telephone Encounter (Signed)
 Called pt to scheduled her  lab appts. She is aware of appts

## 2023-11-10 NOTE — Telephone Encounter (Signed)
 Received telephone call from the patient inquiring why she could not start the chemo pill, Ibrance . Went over comments, per Dr. Lonn (see below). I reviewed her LFT; They are still too high, I do not recommend restart Ibrance  yet Repeat LFT in 2 weeks Patient voiced understanding.

## 2023-11-11 ENCOUNTER — Ambulatory Visit
Admission: RE | Admit: 2023-11-11 | Discharge: 2023-11-11 | Disposition: A | Discharge status: Home / Self Care | Source: Ambulatory Visit | Attending: Registered Nurse | Admitting: Registered Nurse

## 2023-11-11 DIAGNOSIS — R748 Abnormal levels of other serum enzymes: Secondary | ICD-10-CM

## 2023-11-11 DIAGNOSIS — R7401 Elevation of levels of liver transaminase levels: Secondary | ICD-10-CM | POA: Diagnosis not present

## 2023-11-15 ENCOUNTER — Encounter: Payer: Self-pay | Admitting: Hematology

## 2023-11-18 ENCOUNTER — Other Ambulatory Visit: Payer: Self-pay | Admitting: Nurse Practitioner

## 2023-11-18 ENCOUNTER — Inpatient Hospital Stay (HOSPITAL_BASED_OUTPATIENT_CLINIC_OR_DEPARTMENT_OTHER): Admitting: Nurse Practitioner

## 2023-11-18 ENCOUNTER — Inpatient Hospital Stay

## 2023-11-18 VITALS — BP 128/82 | HR 75 | Temp 98.0°F | Resp 17 | Ht 65.0 in | Wt 117.4 lb

## 2023-11-18 DIAGNOSIS — Z170 Estrogen receptor positive status [ER+]: Secondary | ICD-10-CM

## 2023-11-18 DIAGNOSIS — Z79811 Long term (current) use of aromatase inhibitors: Secondary | ICD-10-CM | POA: Diagnosis not present

## 2023-11-18 DIAGNOSIS — Z79899 Other long term (current) drug therapy: Secondary | ICD-10-CM | POA: Diagnosis not present

## 2023-11-18 DIAGNOSIS — C7951 Secondary malignant neoplasm of bone: Secondary | ICD-10-CM | POA: Diagnosis not present

## 2023-11-18 DIAGNOSIS — C50411 Malignant neoplasm of upper-outer quadrant of right female breast: Secondary | ICD-10-CM | POA: Diagnosis not present

## 2023-11-18 DIAGNOSIS — C50811 Malignant neoplasm of overlapping sites of right female breast: Secondary | ICD-10-CM

## 2023-11-18 DIAGNOSIS — Z1732 Human epidermal growth factor receptor 2 negative status: Secondary | ICD-10-CM | POA: Diagnosis not present

## 2023-11-18 DIAGNOSIS — Z1721 Progesterone receptor positive status: Secondary | ICD-10-CM | POA: Diagnosis not present

## 2023-11-18 DIAGNOSIS — R978 Other abnormal tumor markers: Secondary | ICD-10-CM | POA: Diagnosis not present

## 2023-11-18 DIAGNOSIS — C773 Secondary and unspecified malignant neoplasm of axilla and upper limb lymph nodes: Secondary | ICD-10-CM | POA: Diagnosis not present

## 2023-11-18 DIAGNOSIS — M810 Age-related osteoporosis without current pathological fracture: Secondary | ICD-10-CM | POA: Diagnosis not present

## 2023-11-18 DIAGNOSIS — Z791 Long term (current) use of non-steroidal anti-inflammatories (NSAID): Secondary | ICD-10-CM | POA: Diagnosis not present

## 2023-11-18 DIAGNOSIS — Z7983 Long term (current) use of bisphosphonates: Secondary | ICD-10-CM | POA: Diagnosis not present

## 2023-11-18 LAB — CMP (CANCER CENTER ONLY)
ALT: 92 U/L — ABNORMAL HIGH (ref 0–44)
AST: 108 U/L — ABNORMAL HIGH (ref 15–41)
Albumin: 4.3 g/dL (ref 3.5–5.0)
Alkaline Phosphatase: 90 U/L (ref 38–126)
Anion gap: 4 — ABNORMAL LOW (ref 5–15)
BUN: 18 mg/dL (ref 8–23)
CO2: 34 mmol/L — ABNORMAL HIGH (ref 22–32)
Calcium: 9.3 mg/dL (ref 8.9–10.3)
Chloride: 102 mmol/L (ref 98–111)
Creatinine: 0.76 mg/dL (ref 0.44–1.00)
GFR, Estimated: >60 mL/min (ref >60)
Glucose, Bld: 89 mg/dL (ref 70–99)
Potassium: 4.5 mmol/L (ref 3.5–5.1)
Sodium: 140 mmol/L (ref 135–145)
Total Bilirubin: 0.5 mg/dL (ref 0.0–1.2)
Total Protein: 6.5 g/dL (ref 6.5–8.1)

## 2023-11-18 LAB — CBC WITH DIFFERENTIAL (CANCER CENTER ONLY)
Abs Immature Granulocytes: 0 K/uL (ref 0.00–0.07)
Basophils Absolute: 0.1 K/uL (ref 0.0–0.1)
Basophils Relative: 4 %
Eosinophils Absolute: 0.1 K/uL (ref 0.0–0.5)
Eosinophils Relative: 4 %
HCT: 40.8 % (ref 36.0–46.0)
Hemoglobin: 14.1 g/dL (ref 12.0–15.0)
Immature Granulocytes: 0 %
Lymphocytes Relative: 47 %
Lymphs Abs: 1.8 K/uL (ref 0.7–4.0)
MCH: 35.3 pg — ABNORMAL HIGH (ref 26.0–34.0)
MCHC: 34.6 g/dL (ref 30.0–36.0)
MCV: 102 fL — ABNORMAL HIGH (ref 80.0–100.0)
Monocytes Absolute: 0.6 K/uL (ref 0.1–1.0)
Monocytes Relative: 15 %
Neutro Abs: 1.1 K/uL — ABNORMAL LOW (ref 1.7–7.7)
Neutrophils Relative %: 30 %
Platelet Count: 131 K/uL — ABNORMAL LOW (ref 150–400)
RBC: 4 MIL/uL (ref 3.87–5.11)
RDW: 12.9 % (ref 11.5–15.5)
WBC Count: 3.7 K/uL — ABNORMAL LOW (ref 4.0–10.5)
nRBC: 0 % (ref 0.0–0.2)

## 2023-11-18 NOTE — Assessment & Plan Note (Addendum)
 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 S/p bilateral mastectomy with reconstruction and adjuvant radiation. (Implants ultimately removed 01/24/19 by Dr. Arelia.) -She started antiestrogen therapy Exemestane  on 04/01/16, planned for 7 years. She experienced joint pain and hot flashes on exemestane , anastrozole , and tamoxifen . She opted to continue anastrozole . Given her continued difficulty with joint pain, as well as her low risk mammaprint, she stopped after she completed 5 years therapy in 03/2021 -she developed low appetite and weight loss in Feb 2025, CT and bone scan showed bone lesions. PET on 06/01/2023 showed diffuse hypermetabolic bone lesions and hypermetabolic small lymph node in the right axillary region and supraclavicular region on the right low neck, concerning for metastatic disease. -[place] node biopsy on 06/22/2023 confirmed metastatic breast cancer, will request NGS  -she started anastrozole  in 05/2023, Ribciclib and zometa  was added in May 2025 - 10/13/2023 -Ribociclib  discontinued due to negative side effects and elevated liver enzymes.  Anastrozole  continued.  First infusion with Zometa  in June 2025.  - PET scan from September 2025 showed interval resolution of metabolic activity of osseous metastases, right axillary, and right supraclavicular lymph.  Results indicate positive response to treatment.  Plan to start Ibrance  100 mg daily.  She will take for 3 weeks followed by 1 week off, every 28 days.  Continue anastrozole  daily.  Increase frequency of Zometa  to every 3 months.  Recommend daily calcium and vitamin D  supplementation.

## 2023-11-18 NOTE — Progress Notes (Addendum)
 Patient Care Team: [name], FNP as PCP - General (Internal Medicine) Aplington, [name], MD (Inactive) (Orthopedic Surgery) [name], MD (Ophthalmology) [name], MD (Dermatology) [name], MD as Consulting Physician (Hematology) [name], MD as Consulting Physician (General Surgery) [name], MD as Consulting Physician (Radiation Oncology) [name], [name], NP as Nurse Practitioner (Hematology and Oncology) Pickenpack-Cousar, [name], NP as Nurse Practitioner (Hospice and Palliative Medicine) Patel, [name], DO as Consulting Physician (Neurology)  Clinic Day:  11/18/2023  Referring physician: [name], FNP  ASSESSMENT & PLAN:   Assessment & Plan: Malignant neoplasm of overlapping sites of right breast in female, estrogen receptor positive (HCC)  mpT2 pN1a, stage IA, G1,  ER positive, PR positive, HER-2 negative, mammaprint low risk luminal type A, node and bone metastasis in 03/2023 -Diagnosed in 02/2016. S/p bilateral mastectomy with reconstruction and adjuvant radiation. (Implants ultimately removed 01/24/19 by Dr. Arelia.) -She started antiestrogen therapy Exemestane  on 04/01/16, planned for 7 years. She experienced joint pain and hot flashes on exemestane , anastrozole , and tamoxifen . She opted to continue anastrozole . Given her continued difficulty with joint pain, as well as her low risk mammaprint, she stopped after she completed 5 years therapy in 03/2021 -she developed low appetite and weight loss in Feb 2025, CT and bone scan showed bone lesions. PET on 06/01/2023 showed diffuse hypermetabolic bone lesions and hypermetabolic small lymph node in the right axillary region and supraclavicular region on the right low neck, concerning for metastatic disease. -[place] node biopsy on 06/22/2023 confirmed metastatic breast cancer, will request NGS  -she started anastrozole  in 05/2023, Ribciclib and zometa  was added in May 2025 - 10/13/2023  -Ribociclib  discontinued due to negative side effects and elevated liver enzymes.  Anastrozole  continued.  First infusion with Zometa  in June 2025.  - PET scan from September 2025 showed interval resolution of metabolic activity of osseous metastases, right axillary, and right supraclavicular lymph.  Results indicate positive response to treatment.  Plan to start Ibrance  100 mg daily.  She will take for 3 weeks followed by 1 week off, every 28 days.  Continue anastrozole  daily.  Increase frequency of Zometa  to every 3 months.  Recommend daily calcium and vitamin D  supplementation.  Metastatic breast cancer with bone metastases She is currently on anastrozole  daily.  PET scan done on 10/31/2023.  This showed interval resolution of metabolic activity of the osseous metastases.  There is near complete resolution of right axillary and supraclavicular lymphadenopathy, indicating a positive response to treatment.  Plans to start Ibrance  100 mg daily.  Will take for 3 weeks and then take 1 week off.   Negative side effects from [facility] Patient had negative side effects from Kisqali, including nausea, fatigue, skin rash, and diarrhea.  Liver enzymes also severely elevated.  She has been off Kisqali since 10/13/2023.  New check of liver enzymes shows significant improvement, though not back to baseline.  She will start Ibrance  100 mg daily.  Will take for 3 weeks followed by 1 week off.  Continue anastrozole  every day.  Dose can be adjusted based on tolerance.  Elevated LFTs Liver functions continue to improve.  Today, AST 108 (previously 184), ALT 92 (previously 205), and ALKP 90.  She had an abdominal ultrasound, ordered by her primary care provider.  This showed no acute abnormalities in the liver and throughout the abdomen.  Encouraged her to continue with reduction in alcohol consumption.  Will recheck labs in 3 to 4 weeks after addition of Ibrance .  Osteoporosis Patient reports recent bone density test done  at primary care office.  Results were positive for severe osteoporosis and bilateral hips.  She started IV Zometa  in June.  Will increase frequency and treatment to every 3 months, initially ordered for every 6 months.  Continue taking calcium and vitamin D  per day.  Recommend regular physical activity.  Hot flashes and anxiety Patient reporting increased frequency and severity of hot flashes and night sweats.  These are sometimes related to increased anxiety.  Currently on Effexor  225 mg daily.  Takes alprazolam  1 mg as needed.  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  She takes only when needed.  We discussed taking gabapentin  more regularly.  This medication is often used to help with improving hot flashes and reducing anxiety.  Recommend she start with nightly dosing.  If this was beneficial and without negative side effects, she can increase to twice daily.  She is scheduled for a telephone visit with palliative NP, Nikki.  Encouraged the patient to talk with Levon, NP, about adjusting/increasing dose of gabapentin  and/or alprazolam  as needed.  Plan The patient was seen along with Dr. Lanny today.  Labs reviewed. - Mild neutropenia with WBC 3.7 and ANC 1.1. -Improved liver functions. -- Reviewed abdominal ultrasound done by primary care, showing no acute abnormality with the liver or within the abdomen. Start Ibrance  100 mg daily.  Take for 3 weeks followed by 1 week off. Continue anastrozole  daily. Plan to repeat labs in 3 to 4 weeks.  Arrange for Zometa  infusion at that time.  Adjust dosing of Ibrance  as indicated.  The patient understands the plans discussed today and is in agreement with them.  She knows to contact our office if she develops concerns prior to her next appointment.  I provided 30 minutes of face-to-face time during this encounter and > 50% was spent counseling as documented under my assessment and plan.    Powell FORBES Lessen, NP  [facility] - A DEPT OF [facility] [address] [place] [address] Dept: [phone] Dept Fax: [phone]   No orders of the defined types were placed in this encounter.     CHIEF COMPLAINT:  CC: Right breast cancer, ER +  Current Treatment: Anastrozole  and Ribociclib   INTERVAL HISTORY:  Kassie is here today for repeat clinical assessment.  She last saw Dr. Lanny on 10/13/2023.  She since had abdominal ultrasound due to consistently elevated liver enzymes.  She also had a PET scan on 10/31/2023.  This showed interval resolution of metabolic activity of the osseous metastases.  There is resolution of right axillary and supraclavicular lymphadenopathy.  These results indicate a positive response to current treatment.  There is concern of slightly hypermetabolic thyroid  nodule.  This is already been evaluated by ultrasound and deemed to be benign.  The patient has been off Kisqali since her last visit with Dr. Lanny.  She had continued to have negative side effects including fatigue, nausea, and diarrhea through 11/07/2023.  Liver enzymes on the day had improved, but were still not acceptable to begin new treatment with Ibrance .  Today, the patient states she is feeling much better.  She has improved energy.  Appetite is better.  She is not having nausea or diarrhea.  She denies chest pain, chest pressure, or shortness of breath. She denies headaches or visual disturbances. She  denies abdominal pain, nausea, vomiting, or changes in bowel or bladder habits.  She denies fevers or chills. Her appetite is good. Her weight has increased 1 pounds over last month.  She does continue to have hot flashes and night sweats.  She has increased anxiety.  Currently on Effexor  225 mg daily.  Does have alprazolam  which she can use as needed for acute episodes of anxiety.  Has been taking gabapentin  intermittently for neuropathy in the  right arm.  Does not take this daily.  I have reviewed the past medical history, past surgical history, social history and family history with the patient and they are unchanged from previous note.  ALLERGIES:  is allergic to adhesive [tape], promethazine hcl, and sulfa  antibiotics.  MEDICATIONS:  Current Outpatient Medications  Medication Sig Dispense Refill   ALPRAZolam  (XANAX ) 1 MG tablet Take 1 tablet by mouth 3 (three) times daily.     anastrozole  (ARIMIDEX ) 1 MG tablet TAKE 1 TABLET BY MOUTH EVERY DAY 90 tablet 1   celecoxib  (CELEBREX ) 200 MG capsule Take 1 capsule (200 mg total) by mouth daily. 30 capsule 2   cloNIDine  (CATAPRES ) 0.1 MG tablet TAKE 1 TABLET BY MOUTH EVERY DAY 90 tablet 1   gabapentin  (NEURONTIN ) 100 MG capsule Take 1 capsule every day by oral route at dinner. (Patient taking differently: Take 100 mg by mouth as needed.)     IBRANCE  100 MG tablet TAKE 1 TABLET BY MOUTH 1 TIME A DAY ON DAYS 1 TO 21 OF A 28 DAY CYCLE 21 tablet 0   levothyroxine  (SYNTHROID , LEVOTHROID) 50 MCG tablet Take 50 mcg by mouth daily before breakfast.     omeprazole  (PRILOSEC) 40 MG capsule TAKE 1 CAPSULE BY MOUTH TWICE A DAY 180 capsule 2   ondansetron  (ZOFRAN ) 8 MG tablet TAKE 1 TABLET (8 MG TOTAL) BY MOUTH EVERY 4 (FOUR) HOURS AS NEEDED FOR NAUSEA OR VOMITING. 135 tablet 1   oxyCODONE -acetaminophen  (PERCOCET/ROXICET) 5-325 MG tablet Take 1 tablet by mouth every 6 (six) hours as needed for severe pain (pain score 7-10). 45 tablet 0   potassium chloride  (KLOR-CON  M) 10 MEQ tablet Take 1 tablet (10 mEq total) by mouth 2 (two) times daily. 180 tablet 1   prochlorperazine  (COMPAZINE ) 10 MG tablet Take 1 tablet (10 mg total) by mouth every 6 (six) hours as needed for nausea or vomiting. 60 tablet 0   venlafaxine  XR (EFFEXOR -XR) 150 MG 24 hr capsule TAKE 1 CAPSULE BY MOUTH EVERY DAY WITH BREAKFAST 90 capsule 1   venlafaxine  XR (EFFEXOR -XR) 75 MG 24 hr capsule TAKE 1 CAPSULE BY MOUTH DAILY WITH  BREAKFAST. 90 capsule 2   zolpidem  (AMBIEN ) 5 MG tablet Take 1-2 tablets (5-10 mg total) by mouth at bedtime as needed for sleep. 90 tablet 1   No current facility-administered medications for this visit.    HISTORY OF PRESENT ILLNESS:   Oncology History Overview Note  Cancer Staging Malignant neoplasm of overlapping sites of right breast in female, estrogen receptor positive (HCC) Staging form: Breast, AJCC 8th Edition - Clinical stage from 03/23/2016: Stage IIA (cT3, cN1, cM0, G2, ER: Positive, PR: Positive, HER2: Negative) - Signed by Onita Mattock, MD on 03/31/2016 - Pathologic stage from 05/19/2016: Stage IA (pT2(m), pN1a, cM0, G1, ER: Positive, PR: Positive, HER2: Negative) - Signed by Onita Mattock, MD on 06/18/2016     Malignant neoplasm of overlapping sites of right breast in female, estrogen receptor positive (HCC)  03/17/2016 Mammogram   B/l diagnostic mammogram and righ  US  showed a 3.6cm irregular mass in the right breast 11:00 position, posterior depth, there is a enlarged lymph node in the right axilla is highly suspicious for malignancy. additional 7 mm oval mass in the right breast lower outer quadrant is suspicious for malignancy.   03/23/2016 Initial Biopsy   Right breast 9:30 position biopsy showed invasive ductal carcinoma, grade 1. 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   03/23/2016 Receptors her2   Both breast and node biopsy tumor ER 100% positive, PR 70-95% positive, HER-2 negative, Ki-67 40%   03/23/2016 Initial Diagnosis   Malignant neoplasm of upper-outer quadrant of right breast in female, estrogen receptor positive (HCC)   03/25/2016 Initial Biopsy   Right breast 11:00 position core needle biopsy showed invasive duct carcinoma, grade 2.    03/25/2016 Receptors her2   ER 95% positive, PR 90% positive, HER-2 negative, Ki-67 15%   03/25/2016 Miscellaneous   Mammaprint showed low risk type A with index +0.105   03/30/2016 Imaging   Bilateral  breast MRI with and without contrast showed a large lobulated enhancing mass within the upper-outer and lower outer right breast with surrounding nodularity, measuring 6.1 x 4.4 x 5.6 cm. Multiple critically sick and right axillary lymph nodes are demonstrated measuring up to 1.5 cm.    04/01/2016 -  Anti-estrogen oral therapy   Exemestane  25 mg daily, plan for 7 years. Switched to Anastrozole  1mg  in 06/2017 due to joint pain and hot flashes. Due to persistent side effects I changed her to Tamoxifen  in 09/2018. She stopped Tamoxifen  and switched back to anastrozole  in 01/2019 because it was more tolerable.    05/19/2016 Surgery   Bilateral mastectomy and right axillary regional lymph node resection.   05/19/2016 Pathology Results   -Right axillary regional lymph node resection revealed metastatic carcinoma in 2/7 lymph nodes. -Left simple mastectomy revealed lobular neoplasia and fibrocystic changes with adenosis and calcifications. 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 -Skin of the right mastectomy flap was benign. -mpT2, pN1a   06/25/2016 - 08/11/2016 Radiation Therapy   Site/dose:    1. 4 field Right breast was treated to 50.4 Gy in 25 fractions at 1.8 Gy per fraction. 2. The Right breast was boosted to 10 Gy in 5 fractions at 2 Gy per fraction.   04/15/2017 Genetic Testing   The patient had genetic testing due to a personal history of breast cancer and family history of breast, ovarian, and colon cancer. The Common Hereditary Cancer Panel was ordered.  The Common Hereditary Cancer Panel offered by [facility] includes sequencing and/or deletion duplication testing of the following 47 genes: APC, ATM, AXIN2, BARD1, BMPR1A, BRCA1, BRCA2, BRIP1, CDH1, CDKN2A (p14ARF), CDKN2A (p16INK4a), CKD4,  CHEK2, CTNNA1, DICER1, EPCAM (Deletion/duplication testing only), GREM1 (promoter region deletion/duplication testing only), KIT, MEN1, MLH1, MSH2, MSH3, MSH6, MUTYH, NBN, NF1, NHTL1, PALB2, PDGFRA, PMS2, POLD1, POLE, PTEN, RAD50, RAD51C, RAD51D, SDHB, SDHC, SDHD, SMAD4, SMARCA4. STK11, TP53, TSC1, TSC2, and VHL.  The following genes were evaluated for sequence changes only: SDHA and HOXB13 c.251G>A variant only.  Results: Negative, no pathogenic variants identified. The date of this test report is 04/15/2017   06/28/2017 Surgery   COMPLEX REVISION OF BACK SCAR and LEFT BREAST RECONSTRUCTION WITH SILICONE IMPLANT EXCHANGE AND ACELLULARDERMIS TO LEFT CHEST by Dr. Arelia 06/28/17   06/13/2023 Pathology Results  FINAL MICROSCOPIC DIAGNOSIS: A. LYMPH NODE, BIOPSY: -  Metastatic carcinoma morphologically consistent with the patient's known breast primary. ADDENDUM:  An immunohistochemical stain for GATA3 is positive supporting the morphological and clinical impression of breast primary. ADDENDUM: Lymph node, biopsy - Metastatic carcinoma  PROGNOSTIC INDICATORS Results:  IMMUNOHISTOCHEMICAL AND MORPHOMETRIC ANALYSIS PERFORMED MANUALLY  The tumor cells are negative for Her2 (1+). Estrogen Receptor:  70%, positive, strong staining intensity Progesterone Receptor:  80%, positive, strong staining intensity Proliferation Marker Ki67: 20% COMMENT:  The negative hormone receptor study(ies) in this case has an internal positive control.  REFERENCE RANGE ESTROGEN RECEPTOR        NEGATIVE     0%        POSITIVE       =>1% REFERENCE RANGE PROGESTERONE RECEPTOR        NEGATIVE     0%        POSITIVE        =>1% All controls stained appropriately     11/08/2023 PET scan   IMPRESSION: Bilateral mastectomy with expected posttreatment changes in right chest wall.   Interval resolution of metabolic activity of the osseous metastasis and right axillary and supraclavicular lymphadenopathy consistent with  favorable response to treatment.   Stable left lower lobe pulmonary nodule, below PET resolution. Follow-up according to oncologic protocol. No suspicious finding to suggest locally recurrent malignancy, new nodal or distant metastasis.   New focal uptake in right thyroid  lobe may represent a hypermetabolic thyroid  nodule. Recommend correlation with ultrasound.       REVIEW OF SYSTEMS:   Constitutional: Denies fevers, chills or abnormal weight loss. Improved fatigue and appetite.  Eyes: Denies blurriness of vision Ears, nose, mouth, throat, and face: Denies mucositis or sore throat Respiratory: Denies cough, dyspnea or wheezes Cardiovascular: Denies palpitation, chest discomfort or lower extremity swelling Gastrointestinal:  Denies nausea, heartburn or change in bowel habits Skin: Denies abnormal skin rashes Lymphatics: Denies new lymphadenopathy or easy bruising Neurological:Denies numbness, tingling or new weaknesses Behavioral/Psych: Mood is stable, no new changes. Increased anxiety. More frequent and severe hot flashes and night sweats. She thinks these symptoms are exacerbated by anxiety.  All other systems were reviewed with the patient and are negative.   VITALS:   Today's Vitals   11/18/23 1307 11/18/23 1321  BP: 128/82   Pulse: 75   Resp: 17   Temp: 98 F (36.7 C)   TempSrc: Temporal   SpO2: 98%   Weight: 117 lb 6.4 oz (53.3 kg)   Height: 5' 5 (1.651 m)   PainSc:  8    Body mass index is 19.54 kg/m.   Wt Readings from Last 3 Encounters:  11/18/23 117 lb 6.4 oz (53.3 kg)  10/13/23 116 lb 4.8 oz (52.8 kg)  09/16/23 118 lb 6.4 oz (53.7 kg)    Body mass index is 19.54 kg/m.  Performance status (ECOG): 1 - Symptomatic but completely ambulatory  PHYSICAL EXAM:   GENERAL:alert, no distress and comfortable SKIN: skin color, texture, turgor are normal, no rashes or significant lesions EYES: normal, Conjunctiva are pink and non-injected, sclera  clear OROPHARYNX:no exudate, no erythema and lips, buccal mucosa, and tongue normal  NECK: supple, thyroid  normal size, non-tender, without nodularity LYMPH:  no palpable lymphadenopathy in the cervical, axillary or inguinal LUNGS: clear to auscultation and percussion with normal breathing effort HEART: regular rate & rhythm and no murmurs and no lower extremity edema ABDOMEN:abdomen soft, non-tender and normal bowel sounds Musculoskeletal:no cyanosis of digits and  no clubbing  NEURO: alert & oriented x 3 with fluent speech, no focal motor/sensory deficits  LABORATORY DATA:  I have reviewed the data as listed    Component Value Date/Time   NA 140 11/18/2023 1243   NA 141 02/19/2017 1304   K 4.5 11/18/2023 1243   K 4.0 02/19/2017 1304   CL 102 11/18/2023 1243   CO2 34 (H) 11/18/2023 1243   CO2 26 02/19/2017 1304   GLUCOSE 89 11/18/2023 1243   GLUCOSE 99 02/19/2017 1304   BUN 18 11/18/2023 1243   BUN 16.4 02/19/2017 1304   CREATININE 0.76 11/18/2023 1243   CREATININE 0.9 02/19/2017 1304   CALCIUM 9.3 11/18/2023 1243   CALCIUM 8.8 02/19/2017 1304   PROT 6.5 11/18/2023 1243   PROT 6.2 (L) 02/19/2017 1304   ALBUMIN  4.3 11/18/2023 1243   ALBUMIN  3.6 02/19/2017 1304   AST 108 (H) 11/18/2023 1243   AST 22 02/19/2017 1304   ALT 92 (H) 11/18/2023 1243   ALT 23 02/19/2017 1304   ALKPHOS 90 11/18/2023 1243   ALKPHOS 88 02/19/2017 1304   BILITOT 0.5 11/18/2023 1243   BILITOT 0.39 02/19/2017 1304   GFRNONAA >60 11/18/2023 1243   GFRNONAA 72 11/06/2015 0831   GFRAA >60 10/12/2019 1445   GFRAA 83 11/06/2015 0831     Lab Results  Component Value Date   WBC 3.7 (L) 11/18/2023   NEUTROABS 1.1 (L) 11/18/2023   HGB 14.1 11/18/2023   HCT 40.8 11/18/2023   MCV 102.0 (H) 11/18/2023   PLT 131 (L) 11/18/2023    RADIOGRAPHIC STUDIES: US  Abdomen Complete Result Date: 11/15/2023 CLINICAL DATA:  Elevated liver enzymes EXAM: ABDOMEN ULTRASOUND COMPLETE COMPARISON:  PET-CT 11/08/2023  FINDINGS: Gallbladder: Status post cholecystectomy Common bile duct: Diameter: 3 mm Liver: No focal lesion identified. Within normal limits in parenchymal echogenicity. Portal vein is patent on color Doppler imaging with normal direction of blood flow towards the liver. IVC: No abnormality visualized. Pancreas: Visualized portion unremarkable. Spleen: Size and appearance within normal limits. Right Kidney: Length: 9.2 cm. Echogenicity within normal limits. No mass or hydronephrosis visualized. Left Kidney: Length: 9.9 cm. Echogenicity within normal limits. No mass or hydronephrosis visualized. Abdominal aorta: No aneurysm visualized. Other findings: None. IMPRESSION: No significant sonographic abnormality of the abdomen. Electronically Signed   By: Aliene Lloyd M.D.   On: 11/15/2023 13:24   NM PET Image Restag (PS) Skull Base To Thigh Result Date: 11/12/2023 CLINICAL DATA:  Subsequent treatment strategy for right breast cancer status post bilateral mastectomy, assess treatment response EXAM: NUCLEAR MEDICINE PET SKULL BASE TO THIGH TECHNIQUE: 5.79 mCi F-18 FDG was injected intravenously. Full-ring PET imaging was performed from the skull base to thigh after the radiotracer. CT data was obtained and used for attenuation correction and anatomic localization. Fasting blood glucose: 99 mg/dl COMPARISON:  CT abdomen pelvis May 19, 2023, PET-CT June 01, 2023. FINDINGS: Mediastinal blood pool activity: SUV max 1.9 Liver activity: SUV max 2.9 NECK: Hypermetabolic right thyroid  lobe nodule with max SUV 3.6. No suspicious hypermetabolic lymphadenopathy. Previously seen supraclavicular lymph node on prior PET-CT is resolved. Incidental CT findings: None. CHEST: Bilateral mastectomy without suspicious findings to suggest recurrent malignancy. Mild diffuse FDG uptake along the right chest wall site of prior mastectomy likely posttreatment changes. No suspicious axillary lymphadenopathy. Status post right axillary nodal  dissection. A few subcentimeter lymph nodes in left axilla with mild FDG uptake max SUV up to 1.7, likely reactive. Previously seen axillary hypermetabolic lymph node in right  axilla is subcentimeter and without significant FDG uptake max SUV 1.6. Postradiation changes in subpleural anterior aspect of right upper lobe. No suspicious pulmonary nodule or pleural effusion. Incidental CT findings: Left lower posterior lobe peripheral pulmonary nodule is stable measuring 3 mm and below PET resolution (7/40). Expected postradiation changes along the anterior aspect of right lung apex. Left lower lobe punctate calcified micronodule. No pleural effusion. ABDOMEN/PELVIS: No focal finding to suggest nodal or distant metastatic disease. No suspicious lymphadenopathy. Incidental CT findings: Cholecystectomy. Bilateral fullness of extrarenal pelvis right-greater-than-left. Large stool throughout the colon. Hysterectomy. No adnexal mass SKELETON: No suspicious new osseous lesion. Interval resolution of foci of osseous metastasis with metabolic activity throughout the right acetabulum, left hemi sacrum and other foci of activity throughout the spine consistent with favorable response to treatment. Mild degenerative changes of the spine. Incidental CT findings: None. IMPRESSION: Bilateral mastectomy with expected posttreatment changes in right chest wall. Interval resolution of metabolic activity of the osseous metastasis and right axillary and supraclavicular lymphadenopathy consistent with favorable response to treatment. Stable left lower lobe pulmonary nodule, below PET resolution. Follow-up according to oncologic protocol. No suspicious finding to suggest locally recurrent malignancy, new nodal or distant metastasis. New focal uptake in right thyroid  lobe may represent a hypermetabolic thyroid  nodule. Recommend correlation with ultrasound. Electronically Signed   By: Megan  Zare M.D.   On: 11/12/2023 16:12    Addendum I  have seen the patient, examined her. I agree with the assessment and and plan and have edited the notes.   Patient is clinically doing well.  I personally reviewed her restaging PET scan from November 08, 2023, which showed excellent partial response with interval resolution of metabolic activity of the ostial metastasis in the right axilla and supraclavicular lymphadenopathy.  No new disease.  She is tolerating anastrozole  and palbociclib  well, will continue.  Will also continue Zometa  every 3 months.  All questions were answered.  Onita Mattock MD 12/18/2023

## 2023-11-19 ENCOUNTER — Telehealth: Payer: Self-pay | Admitting: Nurse Practitioner

## 2023-11-19 NOTE — Telephone Encounter (Signed)
 I scheduled [name] for her 3 week follow up appointment. I LVM asking her to return my call if she needs to re-schedule.

## 2023-11-23 ENCOUNTER — Encounter: Payer: Self-pay | Admitting: Registered Nurse

## 2023-11-25 ENCOUNTER — Inpatient Hospital Stay: Attending: Nurse Practitioner

## 2023-11-25 DIAGNOSIS — Z923 Personal history of irradiation: Secondary | ICD-10-CM | POA: Insufficient documentation

## 2023-11-25 DIAGNOSIS — Z800 Family history of malignant neoplasm of digestive organs: Secondary | ICD-10-CM | POA: Insufficient documentation

## 2023-11-25 DIAGNOSIS — Z8041 Family history of malignant neoplasm of ovary: Secondary | ICD-10-CM | POA: Insufficient documentation

## 2023-11-25 DIAGNOSIS — Z87891 Personal history of nicotine dependence: Secondary | ICD-10-CM | POA: Insufficient documentation

## 2023-11-25 DIAGNOSIS — Z79899 Other long term (current) drug therapy: Secondary | ICD-10-CM | POA: Insufficient documentation

## 2023-11-25 DIAGNOSIS — Z170 Estrogen receptor positive status [ER+]: Secondary | ICD-10-CM | POA: Insufficient documentation

## 2023-11-25 DIAGNOSIS — C7951 Secondary malignant neoplasm of bone: Secondary | ICD-10-CM | POA: Insufficient documentation

## 2023-11-25 DIAGNOSIS — C50811 Malignant neoplasm of overlapping sites of right female breast: Secondary | ICD-10-CM | POA: Insufficient documentation

## 2023-11-25 DIAGNOSIS — C773 Secondary and unspecified malignant neoplasm of axilla and upper limb lymph nodes: Secondary | ICD-10-CM | POA: Insufficient documentation

## 2023-11-25 DIAGNOSIS — Z79811 Long term (current) use of aromatase inhibitors: Secondary | ICD-10-CM | POA: Insufficient documentation

## 2023-11-30 ENCOUNTER — Encounter: Payer: Self-pay | Admitting: Hematology

## 2023-12-01 ENCOUNTER — Inpatient Hospital Stay: Admitting: Nurse Practitioner

## 2023-12-01 ENCOUNTER — Other Ambulatory Visit: Payer: Self-pay | Admitting: Nurse Practitioner

## 2023-12-01 ENCOUNTER — Encounter: Payer: Self-pay | Admitting: Nurse Practitioner

## 2023-12-01 DIAGNOSIS — C50811 Malignant neoplasm of overlapping sites of right female breast: Secondary | ICD-10-CM

## 2023-12-01 DIAGNOSIS — R530 Neoplastic (malignant) related fatigue: Secondary | ICD-10-CM

## 2023-12-01 DIAGNOSIS — G893 Neoplasm related pain (acute) (chronic): Secondary | ICD-10-CM | POA: Diagnosis not present

## 2023-12-01 DIAGNOSIS — G4709 Other insomnia: Secondary | ICD-10-CM

## 2023-12-01 DIAGNOSIS — Z170 Estrogen receptor positive status [ER+]: Secondary | ICD-10-CM

## 2023-12-01 DIAGNOSIS — M792 Neuralgia and neuritis, unspecified: Secondary | ICD-10-CM

## 2023-12-01 DIAGNOSIS — Z515 Encounter for palliative care: Secondary | ICD-10-CM | POA: Diagnosis not present

## 2023-12-01 MED ORDER — OXYCODONE-ACETAMINOPHEN 5-325 MG PO TABS: 1.0000 | ORAL_TABLET | Freq: Four times a day (QID) | ORAL | 0 refills | Status: DC | PRN

## 2023-12-01 NOTE — Progress Notes (Signed)
 Palliative Medicine [name]  [phone] [phone]) [phone]   Name: [name] Date: 12/01/2023 MRN: [id number]  DOB: September 01, 1960  Patient Care Team: [name], FNP as PCP - General (Internal Medicine) Aplington, [name], MD (Inactive) (Orthopedic Surgery) [name], MD (Ophthalmology) [name], MD (Dermatology) [name], MD as Consulting Physician (Hematology) [name], MD as Consulting Physician (General Surgery) [name], MD as Consulting Physician (Radiation Oncology) [name], [name], NP as Nurse Practitioner (Hematology and Oncology) Pickenpack-Cousar, [name], NP as Nurse Practitioner (Hospice and Palliative Medicine) Patel, [name], DO as Consulting Physician (Neurology)   I connected with [name] on 12/01/23 at  3:00 PM EDT by telephone and verified that I am speaking with the correct person using two identifiers.   I discussed the limitations, risks, security and privacy concerns of performing an evaluation and management service by telemedicine and the availability of in-person appointments. I also discussed with the patient that there may be a patient responsible charge related to this service. The patient expressed understanding and agreed to proceed.   Other persons participating in the visit and their role in the encounter: None   Patient's location: Home  Provider's location: [facility]   INTERVAL HISTORY: [name] is a 63 y.o. female with with oncologic medical history including metastatic breast cancer with diffuse bone lesions, currently on anastrozole , Zometa , and Kisqali .  Palliative is seeing patient for symptom management and goals of care.   SOCIAL HISTORY:     reports that she quit smoking about 7 years ago. Her smoking use included cigarettes. She started smoking about 22 years ago. She has a 7.5 pack-year smoking history. She has never used smokeless tobacco.  She reports current alcohol use of about 14.0 standard drinks of alcohol per week. She reports that she does not use drugs.  ADVANCE DIRECTIVES:  None on file   CODE STATUS: Full code  PAST MEDICAL HISTORY: Past Medical History:  Diagnosis Date   Anxiety    Anxiety disorder    Asthma    h/o asthma as a child   Breast cancer (HCC)    Cancer (HCC) 04/2016   right breast cancer   Cholelithiasis    Chronic kidney disease    obstruction of R kidney, ( not a stone) - currently resolved    Depression    Diverticulosis    DJD (degenerative joint disease)    hands & back    Dyspnea    resolved since she stopped smoking    Epigastric abdominal pain    Esophageal stricture    Family history of adverse reaction to anesthesia    daughter has N&V, takes long time to wake up    Family history of colon cancer    Family history of ovarian cancer    Family hx of colon cancer    Female pelvic peritoneal adhesions 10/26/2012   Ganglion cyst    GERD (gastroesophageal reflux disease)    Headache    low grade currently , family history of migraines    Hemorrhoid    History of radiation therapy 05/03 - 08/11/2016   1. 4 field Right breast was treated to 50.4 Gy in 25 fractions at 1.8 Gy per fraction. 2. The Right breast was boosted to 10 Gy in 5 fractions at 2 Gy per fraction.   Hypertension    Hypothyroidism    Plantar fasciitis, bilateral    PONV (postoperative nausea and  vomiting)    gets anxious with the mask on her face, also remarks that the scop. patch has helped in the past       ALLERGIES:  is allergic to adhesive [tape], promethazine hcl, and sulfa  antibiotics.  MEDICATIONS:  Current Outpatient Medications  Medication Sig Dispense Refill   ALPRAZolam  (XANAX ) 1 MG tablet Take 1 tablet by mouth 3 (three) times daily.     anastrozole  (ARIMIDEX ) 1 MG tablet TAKE 1 TABLET BY MOUTH EVERY DAY 90 tablet 1   celecoxib  (CELEBREX ) 200 MG capsule Take 1 capsule (200 mg total) by mouth  daily. 30 capsule 2   cloNIDine  (CATAPRES ) 0.1 MG tablet TAKE 1 TABLET BY MOUTH EVERY DAY 90 tablet 1   gabapentin  (NEURONTIN ) 100 MG capsule Take 1 capsule every day by oral route at dinner. (Patient taking differently: Take 100 mg by mouth as needed.)     IBRANCE  100 MG tablet TAKE 1 TABLET BY MOUTH 1 TIME A DAY ON DAYS 1 TO 21 OF A 28 DAY CYCLE 21 tablet 0   levothyroxine  (SYNTHROID , LEVOTHROID) 50 MCG tablet Take 50 mcg by mouth daily before breakfast.     omeprazole  (PRILOSEC) 40 MG capsule TAKE 1 CAPSULE BY MOUTH TWICE A DAY 180 capsule 2   ondansetron  (ZOFRAN ) 8 MG tablet TAKE 1 TABLET (8 MG TOTAL) BY MOUTH EVERY 4 (FOUR) HOURS AS NEEDED FOR NAUSEA OR VOMITING. 135 tablet 1   oxyCODONE -acetaminophen  (PERCOCET/ROXICET) 5-325 MG tablet Take 1 tablet by mouth every 6 (six) hours as needed for severe pain (pain score 7-10). 45 tablet 0   potassium chloride  (KLOR-CON  M) 10 MEQ tablet Take 1 tablet (10 mEq total) by mouth 2 (two) times daily. 180 tablet 1   prochlorperazine  (COMPAZINE ) 10 MG tablet Take 1 tablet (10 mg total) by mouth every 6 (six) hours as needed for nausea or vomiting. 60 tablet 0   venlafaxine  XR (EFFEXOR -XR) 150 MG 24 hr capsule TAKE 1 CAPSULE BY MOUTH EVERY DAY WITH BREAKFAST 90 capsule 1   venlafaxine  XR (EFFEXOR -XR) 75 MG 24 hr capsule TAKE 1 CAPSULE BY MOUTH DAILY WITH BREAKFAST. 90 capsule 2   zolpidem  (AMBIEN ) 5 MG tablet Take 1-2 tablets (5-10 mg total) by mouth at bedtime as needed for sleep. 90 tablet 1   No current facility-administered medications for this visit.    VITAL SIGNS: There were no vitals taken for this visit. There were no vitals filed for this visit.  Estimated body mass index is 19.54 kg/m as calculated from the following:   Height as of 11/18/23: 5' 5 (1.651 m).   Weight as of 11/18/23: 117 lb 6.4 oz (53.3 kg).   PERFORMANCE STATUS (ECOG) : 1 - Symptomatic but completely ambulatory   IMPRESSION: Discussed the use of AI scribe software for  clinical note transcription with the patient, who gave verbal consent to proceed.  History of Present Illness Naw Lasala is a 63 year old female who I connected with by phone for follow-up. No acute distress noted. Denies concerns for nausea, vomiting, or constipation.   States she is sleeping better with use of Ambien  5-10 mg at bedtime.  States she does not take 10 mg every night however some nights where it seems more difficult and challenging with her insomnia taking 10 mg is effective.  Tolerating without difficulty.  Patient states she is having some increase symptoms with her anxiety.  Continues to take alprazolam  1 mg 3 times a day as needed.  Patient  reports taking on average 3 tablets a day.  Is requesting potential consideration for increase in dosing.  Her PCP recently refilled current prescription.  Advised patient we will talk in the upcoming weeks however at this time she will continue on current dose as prescribed.  Mrs. Kimbrell reports her pain fluctuates. Her pain has not worsened, although she experienced a fall recently. During the fall, she tried to brace herself, resulting in soreness and bruising, particularly on her arm and head. It some days is better than others specifically based on her level of activity.  She is also experiencing hot flashes.  Confirms she is taking gabapentin  100 mg at bedtime.  Was not consistently taking.  Advised patient she may take 200 mg twice daily if tolerable to not only assist with hot flashes but also with her pain.  She verbalized understanding.  Pain regimen also includes Percocet 5/325 mg every 6 hours as needed for severe pain.  Her pain is primarily localized to the tailbone area with occasional tingling in the arm.   We will continue to closely follow and support. All questions answered and support provided.    Assessment & Plan Pain  Pain fluctuates with level of activity.  Tolerating oxycodone  as needed. She experienced a  fall resulting in bruising of the arm and a bump on the head. She reports soreness but is managing the symptoms. No severe injury requiring immediate intervention.No current issues with pain management reported. - Refill prescription for oxycodone  - Oxycodone  5/325 mg every 6 hours as needed  Insomnia Persistent insomnia requiring medication management. Current regimen includes taking one to two tablets as needed, leading to depletion of medication supply. - Continue Ambien  5-10 mg at bedtime  Tingling in arm/Hot flashes  Tingling in the arm, likely neuropathic in nature. Recent consultation with a neurologist did not reveal any abnormalities. -Continue gabapentin  200mg  twice daily.   Generalized anxiety disorder Generalized anxiety disorder is managed with Xanax . She has been taking more Xanax  recently and requested an increase in prescription quantity as discussed with her primary care doctor.  Advised patient to continue low-dose as prescribed adjustments as needed in the future.   - Continue alprazolam  1 mg 3 times daily as prescribed by PCP.  I will plan to see patient back in 3-4 weeks. Sooner if needed.   Patient expressed understanding and was in agreement with this plan. She also understands that She can call the clinic at any time with any questions, concerns, or complaints.   Any controlled substances utilized were prescribed in the context of palliative care. PDMP has been reviewed.   I personally spent a total of 25 minutes in the care of the patient today including preparing to see the patient, getting/reviewing separately obtained history, counseling and educating, placing orders, documenting clinical information in the EHR, independently interpreting results, communicating results, and coordinating care. Visit consisted of counseling and education dealing with the complex and emotionally intense issues of symptom management and palliative care in the setting of serious and  potentially life-threatening illness.  Levon Borer, AGPCNP-BC  Palliative Medicine Team/[facility]

## 2023-12-03 ENCOUNTER — Telehealth: Payer: Self-pay | Admitting: Nurse Practitioner

## 2023-12-03 NOTE — Telephone Encounter (Signed)
 Scheduled patient for next palliative appointment. Called and spoke with the patient, she is aware.

## 2023-12-04 ENCOUNTER — Other Ambulatory Visit: Payer: Self-pay | Admitting: Hematology

## 2023-12-07 ENCOUNTER — Ambulatory Visit: Admitting: Neurology

## 2023-12-09 ENCOUNTER — Other Ambulatory Visit: Payer: Self-pay | Admitting: Nurse Practitioner

## 2023-12-09 DIAGNOSIS — Z170 Estrogen receptor positive status [ER+]: Secondary | ICD-10-CM

## 2023-12-09 NOTE — Progress Notes (Signed)
 Patient Care Team: [name], FNP as PCP - General (Internal Medicine) Aplington, [name], MD (Inactive) (Orthopedic Surgery) [name], MD (Ophthalmology) [name], MD (Dermatology) [name], MD as Consulting Physician (Hematology) [name], MD as Consulting Physician (General Surgery) [name], MD as Consulting Physician (Radiation Oncology) [name], [name], NP as Nurse Practitioner (Hematology and Oncology) Pickenpack-Cousar, [name], NP as Nurse Practitioner (Hospice and Palliative Medicine) Patel, [name], DO as Consulting Physician (Neurology)  Clinic Day:  12/10/2023  Referring physician: [name], FNP  ASSESSMENT & PLAN:   Assessment & Plan: Malignant neoplasm of overlapping sites of right breast in female, estrogen receptor positive (HCC)  mpT2 pN1a, stage IA, G1,  ER positive, PR positive, HER-2 negative, mammaprint low risk luminal type A, node and bone metastasis in 03/2023 -Diagnosed in 02/2016. S/p bilateral mastectomy with reconstruction and adjuvant radiation. (Implants ultimately removed 01/24/19 by Dr. Arelia.) -She started antiestrogen therapy Exemestane  on 04/01/16, planned for 7 years. She experienced joint pain and hot flashes on exemestane , anastrozole , and tamoxifen . She opted to continue anastrozole . Given her continued difficulty with joint pain, as well as her low risk mammaprint, she stopped after she completed 5 years therapy in 03/2021 -she developed low appetite and weight loss in Feb 2025, CT and bone scan showed bone lesions. PET on 06/01/2023 showed diffuse hypermetabolic bone lesions and hypermetabolic small lymph node in the right axillary region and supraclavicular region on the right low neck, concerning for metastatic disease. -[place] node biopsy on 06/22/2023 confirmed metastatic breast cancer, will request NGS  -she started anastrozole  in 05/2023, Ribciclib and zometa  was added in May 2025 - 10/13/2023  -Ribociclib  discontinued due to negative side effects and elevated liver enzymes.  Anastrozole  continued.  First infusion with Zometa  in June 2025.  - PET scan from September 2025 showed interval resolution of metabolic activity of osseous metastases, right axillary, and right supraclavicular lymph.  Results indicate positive response to treatment.  Plan to start Ibrance  100 mg daily.  She will take for 3 weeks followed by 1 week off, every 28 days.  Continue anastrozole  daily.  Increase frequency of Zometa  to every 3 months.  Recommend daily calcium and vitamin D  supplementation. -12/10/2023 - patient continues to tolerate Ibrance  better than Kisqali. She has improved fatigue and nausea. Appetite doing better with stable weight. Zometa  to be administered today and every 3 months. Conitnue with anastrozole  and Ibrance  as prescribed.  --plan for labs, follow up, and Zomata infusion in 3 months .   Neutropenia Mild neutropenia with WBC 3.0 and ANC 0.7.  This is expected side effect from Ibrance .  Recommend good hand hygiene.  Wear mask in public.  Try to avoid sick contacts.  Notify us  in clinic if she develops fever she develops fever, chills, and/or night sweats, especially if tylenol  does not improve symptoms.  Bone metastases Calcium level 9.7 today.  Magnesium is 1.7.  She is due for Zometa  infusion.  Will proceed.  Continue every 3 months Zometa  infusions.  Abnormal liver functions Improved LFTs with AST 110 and ALT 109.  Alkaline phosphatase normal at 77.  Will have her continue anastrozole  daily and Ibrance  on days 1-21 of every 28-day cycle.  Continue to monitor closely.  Adjust treatment as indicated.  Plan Labs reviewed. - Mild and improved neutropenia with WBC 3.0 and ANC 0.7. - Mild/moderate elevation of AST at 110 and ALT at 109.  Normal alkaline phosphatase at 77.  CMP otherwise unremarkable. -Magnesium level  normal at 1.7. Labs and patient presentation are appropriate for treatment  with Zometa  today.  Proceed to schedule. Continue with Zometa  infusions every 3 months. Continue anastrozole  daily and Ibrance  on days 1-21 of every 28-day cycle. Labs, follow-up, and Zometa  infusion in 3 months, sooner if needed.  The patient understands the plans discussed today and is in agreement with them.  She knows to contact our office if she develops concerns prior to her next appointment.  I provided 25 minutes of face-to-face time during this encounter and > 50% was spent counseling as documented under my assessment and plan.    Katie FORBES Lessen, NP  [facility] [place] [address] [place] [address] Dept: [phone] Dept Fax: [phone]   Orders Placed This Encounter  Procedures   CMP (Cancer Center only)    Standing Status:   Future    Number of Occurrences:   1    Expected Date:   12/10/2023    Expiration Date:   03/09/2024   Cancer antigen 27.29    Standing Status:   Future    Number of Occurrences:   1    Expected Date:   12/10/2023    Expiration Date:   03/09/2024   Magnesium    Standing Status:   Future    Number of Occurrences:   1    Expected Date:   12/10/2023    Expiration Date:   03/09/2024      CHIEF COMPLAINT:  CC: Right breast cancer, ER +  Current Treatment: Anastrozole  daily, Ribociclib , Zometa  every 3 months  INTERVAL HISTORY:  [name] is here today for repeat clinical assessment.  She last saw me on 11/19/2023.  States she has been doing much better with Ibrance  then with Kisqali. She is less fatigued and less nauseous.  States she did have a fall at home on 10/2.  She got dizzy when getting up from bed to use the bathroom.  States she got up too quickly which caused the dizziness.  The room was dark with no lights.  States she is doing okay.  Did not seek treatment as she was okay after the fall.  States she does have some hot flashes which are manageable.   Denies night sweats.  Joint pain is slightly improved.  She denies chest pain, chest pressure, or shortness of breath. She denies headaches or visual disturbances. She denies abdominal pain, nausea, vomiting, or changes in bowel or bladder habits.   She denies fevers or chills.  She denies unintentional weight loss. Her appetite is improved. Her weight has been stable.  I have reviewed the past medical history, past surgical history, social history and family history with the patient and they are unchanged from previous note.  ALLERGIES:  is allergic to adhesive [tape], promethazine hcl, and sulfa  antibiotics.  MEDICATIONS:  Current Outpatient Medications  Medication Sig Dispense Refill   ALPRAZolam  (XANAX ) 1 MG tablet Take 1 tablet by mouth 3 (three) times daily.     anastrozole  (ARIMIDEX ) 1 MG tablet TAKE 1 TABLET BY MOUTH EVERY DAY 90 tablet 1   celecoxib  (CELEBREX ) 200 MG capsule Take 1 capsule (200 mg total) by mouth daily. 30 capsule 2   cloNIDine  (CATAPRES ) 0.1 MG tablet TAKE 1 TABLET BY MOUTH EVERY DAY 90 tablet 1   gabapentin  (NEURONTIN ) 100 MG capsule Take 1 capsule every day by oral route at dinner. (Patient  taking differently: Take 100 mg by mouth as needed.)     IBRANCE  100 MG tablet TAKE 1 TABLET BY MOUTH 1 TIME A DAY ON DAYS 1 TO 21 OF A 28 DAY CYCLE 21 tablet 0   levothyroxine  (SYNTHROID , LEVOTHROID) 50 MCG tablet Take 50 mcg by mouth daily before breakfast.     omeprazole  (PRILOSEC) 40 MG capsule TAKE 1 CAPSULE BY MOUTH TWICE A DAY 180 capsule 2   ondansetron  (ZOFRAN ) 8 MG tablet TAKE 1 TABLET (8 MG TOTAL) BY MOUTH EVERY 4 (FOUR) HOURS AS NEEDED FOR NAUSEA OR VOMITING. 135 tablet 1   oxyCODONE -acetaminophen  (PERCOCET/ROXICET) 5-325 MG tablet Take 1 tablet by mouth every 6 (six) hours as needed for severe pain (pain score 7-10). 45 tablet 0   potassium chloride  (KLOR-CON  M) 10 MEQ tablet Take 1 tablet (10 mEq total) by mouth 2 (two) times daily. 180 tablet 1   prochlorperazine   (COMPAZINE ) 10 MG tablet Take 1 tablet (10 mg total) by mouth every 6 (six) hours as needed for nausea or vomiting. 60 tablet 0   venlafaxine  XR (EFFEXOR -XR) 150 MG 24 hr capsule TAKE 1 CAPSULE BY MOUTH EVERY DAY WITH BREAKFAST 90 capsule 1   venlafaxine  XR (EFFEXOR -XR) 75 MG 24 hr capsule TAKE 1 CAPSULE BY MOUTH DAILY WITH BREAKFAST. 90 capsule 2   zolpidem  (AMBIEN ) 5 MG tablet Take 1-2 tablets (5-10 mg total) by mouth at bedtime as needed for sleep. 90 tablet 1   No current facility-administered medications for this visit.    HISTORY OF PRESENT ILLNESS:   Oncology History Overview Note  Cancer Staging Malignant neoplasm of overlapping sites of right breast in female, estrogen receptor positive (HCC) Staging form: Breast, AJCC 8th Edition - Clinical stage from 03/23/2016: Stage IIA (cT3, cN1, cM0, G2, ER: Positive, PR: Positive, HER2: Negative) - Signed by Onita Mattock, MD on 03/31/2016 - Pathologic stage from 05/19/2016: Stage IA (pT2(m), pN1a, cM0, G1, ER: Positive, PR: Positive, HER2: Negative) - Signed by Onita Mattock, MD on 06/18/2016     Malignant neoplasm of overlapping sites of right breast in female, estrogen receptor positive (HCC)  03/17/2016 Mammogram   B/l diagnostic mammogram and righ US  showed a 3.6cm irregular mass in the right breast 11:00 position, posterior depth, there is a enlarged lymph node in the right axilla is highly suspicious for malignancy. additional 7 mm oval mass in the right breast lower outer quadrant is suspicious for malignancy.   03/23/2016 Initial Biopsy   Right breast 9:30 position biopsy showed invasive ductal carcinoma, grade 1. 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   03/23/2016 Receptors her2   Both breast and node biopsy tumor ER 100% positive, PR 70-95% positive, HER-2 negative, Ki-67 40%   03/23/2016 Initial Diagnosis   Malignant neoplasm of upper-outer quadrant of right breast in female, estrogen receptor positive (HCC)    03/25/2016 Initial Biopsy   Right breast 11:00 position core needle biopsy showed invasive duct carcinoma, grade 2.    03/25/2016 Receptors her2   ER 95% positive, PR 90% positive, HER-2 negative, Ki-67 15%   03/25/2016 Miscellaneous   Mammaprint showed low risk type A with index +0.105   03/30/2016 Imaging   Bilateral breast MRI with and without contrast showed a large lobulated enhancing mass within the upper-outer and lower outer right breast with surrounding nodularity, measuring 6.1 x 4.4 x 5.6 cm. Multiple critically sick and right axillary lymph nodes are demonstrated measuring up to 1.5 cm.    04/01/2016 -  Anti-estrogen oral therapy   Exemestane  25 mg daily, plan for 7 years. Switched to Anastrozole  1mg  in 06/2017 due to joint pain and hot flashes. Due to persistent side effects I changed her to Tamoxifen  in 09/2018. She stopped Tamoxifen  and switched back to anastrozole  in 01/2019 because it was more tolerable.    05/19/2016 Surgery   Bilateral mastectomy and right axillary regional lymph node resection.   05/19/2016 Pathology Results   -Right axillary regional lymph node resection revealed metastatic carcinoma in 2/7 lymph nodes. -Left simple mastectomy revealed lobular neoplasia and fibrocystic changes with adenosis and calcifications. 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 -Skin of the right mastectomy flap was benign. -mpT2, pN1a   06/25/2016 - 08/11/2016 Radiation Therapy   Site/dose:    1. 4 field Right breast was treated to 50.4 Gy in 25 fractions at 1.8 Gy per fraction. 2. The Right breast was boosted to 10 Gy in 5 fractions at 2 Gy per fraction.   04/15/2017 Genetic Testing   The patient had genetic testing due to a personal history of breast cancer and family history  of breast, ovarian, and colon cancer. The Common Hereditary Cancer Panel was ordered.  The Common Hereditary Cancer Panel offered by [facility] includes sequencing and/or deletion duplication testing of the following 47 genes: APC, ATM, AXIN2, BARD1, BMPR1A, BRCA1, BRCA2, BRIP1, CDH1, CDKN2A (p14ARF), CDKN2A (p16INK4a), CKD4, CHEK2, CTNNA1, DICER1, EPCAM (Deletion/duplication testing only), GREM1 (promoter region deletion/duplication testing only), KIT, MEN1, MLH1, MSH2, MSH3, MSH6, MUTYH, NBN, NF1, NHTL1, PALB2, PDGFRA, PMS2, POLD1, POLE, PTEN, RAD50, RAD51C, RAD51D, SDHB, SDHC, SDHD, SMAD4, SMARCA4. STK11, TP53, TSC1, TSC2, and VHL.  The following genes were evaluated for sequence changes only: SDHA and HOXB13 c.251G>A variant only.  Results: Negative, no pathogenic variants identified. The date of this test report is 04/15/2017   06/28/2017 Surgery   COMPLEX REVISION OF BACK SCAR and LEFT BREAST RECONSTRUCTION WITH SILICONE IMPLANT EXCHANGE AND ACELLULARDERMIS TO LEFT CHEST by Dr. Arelia 06/28/17   06/13/2023 Pathology Results   FINAL MICROSCOPIC DIAGNOSIS: A. LYMPH NODE, BIOPSY: -  Metastatic carcinoma morphologically consistent with the patient's known breast primary. ADDENDUM:  An immunohistochemical stain for GATA3 is positive supporting the morphological and clinical impression of breast primary. ADDENDUM: Lymph node, biopsy - Metastatic carcinoma  PROGNOSTIC INDICATORS Results:  IMMUNOHISTOCHEMICAL AND MORPHOMETRIC ANALYSIS PERFORMED MANUALLY  The tumor cells are negative for Her2 (1+). Estrogen Receptor:  70%, positive, strong staining intensity Progesterone Receptor:  80%, positive, strong staining intensity Proliferation Marker Ki67: 20% COMMENT:  The negative hormone receptor study(ies) in this case has an internal positive control.  REFERENCE RANGE ESTROGEN RECEPTOR        NEGATIVE     0%        POSITIVE       =>1% REFERENCE RANGE PROGESTERONE RECEPTOR        NEGATIVE     0%         POSITIVE        =>1% All controls stained appropriately     11/08/2023 PET scan   IMPRESSION: Bilateral mastectomy with expected posttreatment changes in right chest wall.   Interval resolution of metabolic activity of the osseous metastasis and right axillary and supraclavicular lymphadenopathy consistent with favorable response to treatment.   Stable left lower lobe pulmonary nodule, below PET resolution. Follow-up according to oncologic  protocol. No suspicious finding to suggest locally recurrent malignancy, new nodal or distant metastasis.   New focal uptake in right thyroid  lobe may represent a hypermetabolic thyroid  nodule. Recommend correlation with ultrasound.       REVIEW OF SYSTEMS:   Constitutional: Denies fevers, chills or abnormal weight loss.  Improved fatigue. Eyes: Denies blurriness of vision Ears, nose, mouth, throat, and face: Denies mucositis or sore throat Respiratory: Denies cough, dyspnea or wheezes Cardiovascular: Denies palpitation, chest discomfort or lower extremity swelling Gastrointestinal:  Denies nausea, heartburn or change in bowel habits Skin: Denies abnormal skin rashes Lymphatics: Denies new lymphadenopathy or easy bruising Neurological:Denies numbness, tingling or new weaknesses Behavioral/Psych: Mood is stable, no new changes  All other systems were reviewed with the patient and are negative.   VITALS:   Today's Vitals   12/10/23 1200 12/10/23 1242 12/10/23 1243  BP:  122/68 116/70  Pulse:  86   Resp:  17   Temp:  98 F (36.7 C)   TempSrc:  Temporal   SpO2:  98%   Weight:  117 lb 4.8 oz (53.2 kg)   Height:  5' 5 (1.651 m)   PainSc: 0-No pain     Body mass index is 19.52 kg/m.   Wt Readings from Last 3 Encounters:  12/10/23 117 lb 4.8 oz (53.2 kg)  11/18/23 117 lb 6.4 oz (53.3 kg)  10/13/23 116 lb 4.8 oz (52.8 kg)    Body mass index is 19.52 kg/m.  Performance status (ECOG): 1 - Symptomatic but completely  ambulatory  PHYSICAL EXAM:   GENERAL:alert, no distress and comfortable SKIN: skin color, texture, turgor are normal, no rashes or significant lesions EYES: normal, Conjunctiva are pink and non-injected, sclera clear OROPHARYNX:no exudate, no erythema and lips, buccal mucosa, and tongue normal  NECK: supple, thyroid  normal size, non-tender, without nodularity LYMPH:  no palpable lymphadenopathy in the cervical, axillary or inguinal LUNGS: clear to auscultation and percussion with normal breathing effort HEART: regular rate & rhythm and no murmurs and no lower extremity edema ABDOMEN:abdomen soft, non-tender and normal bowel sounds Musculoskeletal:no cyanosis of digits and no clubbing  NEURO: alert & oriented x 3 with fluent speech, no focal motor/sensory deficits  LABORATORY DATA:  I have reviewed the data as listed    Component Value Date/Time   NA 141 12/10/2023 1349   NA 141 02/19/2017 1304   K 4.0 12/10/2023 1349   K 4.0 02/19/2017 1304   CL 104 12/10/2023 1349   CO2 30 12/10/2023 1349   CO2 26 02/19/2017 1304   GLUCOSE 127 (H) 12/10/2023 1349   GLUCOSE 99 02/19/2017 1304   BUN 15 12/10/2023 1349   BUN 16.4 02/19/2017 1304   CREATININE 0.69 12/10/2023 1349   CREATININE 0.9 02/19/2017 1304   CALCIUM 9.7 12/10/2023 1349   CALCIUM 8.8 02/19/2017 1304   PROT 6.5 12/10/2023 1349   PROT 6.2 (L) 02/19/2017 1304   ALBUMIN  4.2 12/10/2023 1349   ALBUMIN  3.6 02/19/2017 1304   AST 110 (H) 12/10/2023 1349   AST 22 02/19/2017 1304   ALT 109 (H) 12/10/2023 1349   ALT 23 02/19/2017 1304   ALKPHOS 77 12/10/2023 1349   ALKPHOS 88 02/19/2017 1304   BILITOT 0.6 12/10/2023 1349   BILITOT 0.39 02/19/2017 1304   GFRNONAA >60 12/10/2023 1349   GFRNONAA 72 11/06/2015 0831   GFRAA >60 10/12/2019 1445   GFRAA 83 11/06/2015 0831     Lab Results  Component Value Date   WBC 3.0 (L)  12/10/2023   NEUTROABS 0.7 (L) 12/10/2023   HGB 14.3 12/10/2023   HCT 41.1 12/10/2023   MCV 100.7 (H)  12/10/2023   PLT 151 12/10/2023

## 2023-12-09 NOTE — Assessment & Plan Note (Addendum)
 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 S/p bilateral mastectomy with reconstruction and adjuvant radiation. (Implants ultimately removed 01/24/19 by Dr. Arelia.) -She started antiestrogen therapy Exemestane  on 04/01/16, planned for 7 years. She experienced joint pain and hot flashes on exemestane , anastrozole , and tamoxifen . She opted to continue anastrozole . Given her continued difficulty with joint pain, as well as her low risk mammaprint, she stopped after she completed 5 years therapy in 03/2021 -she developed low appetite and weight loss in Feb 2025, CT and bone scan showed bone lesions. PET on 06/01/2023 showed diffuse hypermetabolic bone lesions and hypermetabolic small lymph node in the right axillary region and supraclavicular region on the right low neck, concerning for metastatic disease. -[place] node biopsy on 06/22/2023 confirmed metastatic breast cancer, will request NGS  -she started anastrozole  in 05/2023, Ribciclib and zometa  was added in May 2025 - 10/13/2023 -Ribociclib  discontinued due to negative side effects and elevated liver enzymes.  Anastrozole  continued.  First infusion with Zometa  in June 2025.  - PET scan from September 2025 showed interval resolution of metabolic activity of osseous metastases, right axillary, and right supraclavicular lymph.  Results indicate positive response to treatment.  Plan to start Ibrance  100 mg daily.  She will take for 3 weeks followed by 1 week off, every 28 days.  Continue anastrozole  daily.  Increase frequency of Zometa  to every 3 months.  Recommend daily calcium and vitamin D  supplementation. -12/10/2023 - patient continues to tolerate Ibrance  better than Kisqali. She has improved fatigue and nausea. Appetite doing better with stable weight. Zometa  to be administered today and every 3 months. Conitnue with anastrozole  and Ibrance  as  prescribed.  --plan for labs, follow up, and Zomata infusion in 3 months .

## 2023-12-10 ENCOUNTER — Inpatient Hospital Stay (HOSPITAL_BASED_OUTPATIENT_CLINIC_OR_DEPARTMENT_OTHER): Admitting: Nurse Practitioner

## 2023-12-10 ENCOUNTER — Inpatient Hospital Stay

## 2023-12-10 VITALS — BP 116/70 | HR 86 | Temp 98.0°F | Resp 17 | Ht 65.0 in | Wt 117.3 lb

## 2023-12-10 DIAGNOSIS — Z87891 Personal history of nicotine dependence: Secondary | ICD-10-CM | POA: Diagnosis not present

## 2023-12-10 DIAGNOSIS — C50011 Malignant neoplasm of nipple and areola, right female breast: Secondary | ICD-10-CM

## 2023-12-10 DIAGNOSIS — Z923 Personal history of irradiation: Secondary | ICD-10-CM | POA: Diagnosis not present

## 2023-12-10 DIAGNOSIS — Z170 Estrogen receptor positive status [ER+]: Secondary | ICD-10-CM | POA: Diagnosis not present

## 2023-12-10 DIAGNOSIS — Z79811 Long term (current) use of aromatase inhibitors: Secondary | ICD-10-CM | POA: Diagnosis not present

## 2023-12-10 DIAGNOSIS — C50811 Malignant neoplasm of overlapping sites of right female breast: Secondary | ICD-10-CM

## 2023-12-10 DIAGNOSIS — M816 Localized osteoporosis [Lequesne]: Secondary | ICD-10-CM

## 2023-12-10 DIAGNOSIS — C7951 Secondary malignant neoplasm of bone: Secondary | ICD-10-CM | POA: Diagnosis present

## 2023-12-10 DIAGNOSIS — Z800 Family history of malignant neoplasm of digestive organs: Secondary | ICD-10-CM | POA: Diagnosis not present

## 2023-12-10 DIAGNOSIS — Z79899 Other long term (current) drug therapy: Secondary | ICD-10-CM | POA: Diagnosis not present

## 2023-12-10 DIAGNOSIS — C773 Secondary and unspecified malignant neoplasm of axilla and upper limb lymph nodes: Secondary | ICD-10-CM | POA: Diagnosis not present

## 2023-12-10 DIAGNOSIS — Z8041 Family history of malignant neoplasm of ovary: Secondary | ICD-10-CM | POA: Diagnosis not present

## 2023-12-10 LAB — CBC WITH DIFFERENTIAL (CANCER CENTER ONLY)
Abs Immature Granulocytes: 0 K/uL (ref 0.00–0.07)
Basophils Absolute: 0.1 K/uL (ref 0.0–0.1)
Basophils Relative: 4 %
Eosinophils Absolute: 0.1 K/uL (ref 0.0–0.5)
Eosinophils Relative: 2 %
HCT: 41.1 % (ref 36.0–46.0)
Hemoglobin: 14.3 g/dL (ref 12.0–15.0)
Immature Granulocytes: 0 %
Lymphocytes Relative: 57 %
Lymphs Abs: 1.7 K/uL (ref 0.7–4.0)
MCH: 35 pg — ABNORMAL HIGH (ref 26.0–34.0)
MCHC: 34.8 g/dL (ref 30.0–36.0)
MCV: 100.7 fL — ABNORMAL HIGH (ref 80.0–100.0)
Monocytes Absolute: 0.4 K/uL (ref 0.1–1.0)
Monocytes Relative: 12 %
Neutro Abs: 0.7 K/uL — ABNORMAL LOW (ref 1.7–7.7)
Neutrophils Relative %: 25 %
Platelet Count: 151 K/uL (ref 150–400)
RBC: 4.08 MIL/uL (ref 3.87–5.11)
RDW: 13.1 % (ref 11.5–15.5)
WBC Count: 3 K/uL — ABNORMAL LOW (ref 4.0–10.5)
nRBC: 0 % (ref 0.0–0.2)

## 2023-12-10 LAB — CMP (CANCER CENTER ONLY)
ALT: 109 U/L — ABNORMAL HIGH (ref 0–44)
AST: 110 U/L — ABNORMAL HIGH (ref 15–41)
Albumin: 4.2 g/dL (ref 3.5–5.0)
Alkaline Phosphatase: 77 U/L (ref 38–126)
Anion gap: 7 (ref 5–15)
BUN: 15 mg/dL (ref 8–23)
CO2: 30 mmol/L (ref 22–32)
Calcium: 9.7 mg/dL (ref 8.9–10.3)
Chloride: 104 mmol/L (ref 98–111)
Creatinine: 0.69 mg/dL (ref 0.44–1.00)
GFR, Estimated: >60 mL/min (ref >60)
Glucose, Bld: 127 mg/dL — ABNORMAL HIGH (ref 70–99)
Potassium: 4 mmol/L (ref 3.5–5.1)
Sodium: 141 mmol/L (ref 135–145)
Total Bilirubin: 0.6 mg/dL (ref 0.0–1.2)
Total Protein: 6.5 g/dL (ref 6.5–8.1)

## 2023-12-10 LAB — MAGNESIUM: Magnesium: 1.7 mg/dL (ref 1.7–2.4)

## 2023-12-10 MED ORDER — ZOLEDRONIC ACID 4 MG/100ML IV SOLN
4.0000 mg | Freq: Once | INTRAVENOUS | Status: AC
  Administered 2023-12-10: 4 mg via INTRAVENOUS
  Filled 2023-12-10: qty 100

## 2023-12-10 NOTE — Patient Instructions (Signed)

## 2023-12-11 LAB — CANCER ANTIGEN 27.29: CA 27.29: 270.2 U/mL — ABNORMAL HIGH (ref 0.0–38.6)

## 2023-12-27 ENCOUNTER — Encounter: Payer: Self-pay | Admitting: Hematology

## 2023-12-27 ENCOUNTER — Encounter: Payer: Self-pay | Admitting: Nurse Practitioner

## 2023-12-28 ENCOUNTER — Inpatient Hospital Stay: Attending: Nurse Practitioner | Admitting: Nurse Practitioner

## 2023-12-28 ENCOUNTER — Encounter: Payer: Self-pay | Admitting: Nurse Practitioner

## 2023-12-28 VITALS — BP 127/80 | HR 84 | Temp 97.7°F | Resp 16 | Wt 117.6 lb

## 2023-12-28 DIAGNOSIS — C50811 Malignant neoplasm of overlapping sites of right female breast: Secondary | ICD-10-CM | POA: Diagnosis not present

## 2023-12-28 DIAGNOSIS — Z515 Encounter for palliative care: Secondary | ICD-10-CM | POA: Diagnosis not present

## 2023-12-28 DIAGNOSIS — G893 Neoplasm related pain (acute) (chronic): Secondary | ICD-10-CM

## 2023-12-28 DIAGNOSIS — G4709 Other insomnia: Secondary | ICD-10-CM | POA: Diagnosis not present

## 2023-12-28 DIAGNOSIS — Z170 Estrogen receptor positive status [ER+]: Secondary | ICD-10-CM

## 2023-12-28 DIAGNOSIS — R110 Nausea: Secondary | ICD-10-CM

## 2023-12-28 MED ORDER — PROCHLORPERAZINE MALEATE 10 MG PO TABS: 10.0000 mg | ORAL_TABLET | Freq: Four times a day (QID) | ORAL | 3 refills | Status: AC | PRN

## 2023-12-28 MED ORDER — OXYCODONE-ACETAMINOPHEN 5-325 MG PO TABS: 1.0000 | ORAL_TABLET | Freq: Four times a day (QID) | ORAL | 0 refills | Status: DC | PRN

## 2023-12-28 MED ORDER — ZOLPIDEM TARTRATE 10 MG PO TABS: 10.0000 mg | ORAL_TABLET | Freq: Every evening | ORAL | 0 refills | Status: DC | PRN

## 2023-12-28 NOTE — Progress Notes (Unsigned)
 Palliative Medicine [facility]  Telephone:(336) [phone] [phone]) [phone]   Name: [name] Date: 12/28/2023 MRN: [id number]  DOB: 01/23/1961  Patient Care Team: [name], FNP as PCP - General (Internal Medicine) Aplington, [name], MD (Inactive) (Orthopedic Surgery) [name], MD (Ophthalmology) [name], MD (Dermatology) [name], MD as Consulting Physician (Hematology) [name], MD as Consulting Physician (General Surgery) [name], MD as Consulting Physician (Radiation Oncology) [name], [name], NP as Nurse Practitioner (Hematology and Oncology) Pickenpack-Cousar, [name], NP as Nurse Practitioner (Hospice and Palliative Medicine) Patel, [name], DO as Consulting Physician (Neurology)   INTERVAL HISTORY: [name] is a 63 y.o. female with with oncologic medical history including metastatic breast cancer with diffuse bone lesions, currently on anastrozole , Zometa , and Kisqali .  Palliative is seeing patient for symptom management and goals of care.   SOCIAL HISTORY:     reports that she quit smoking about 7 years ago. Her smoking use included cigarettes. She started smoking about 22 years ago. She has a 7.5 pack-year smoking history. She has never used smokeless tobacco. She reports current alcohol use of about 14.0 standard drinks of alcohol per week. She reports that she does not use drugs.  ADVANCE DIRECTIVES:  None on file   CODE STATUS: Full code  PAST MEDICAL HISTORY: Past Medical History:  Diagnosis Date   Anxiety    Anxiety disorder    Asthma    h/o asthma as a child   Breast cancer (HCC)    Cancer (HCC) 04/2016   right breast cancer   Cholelithiasis    Chronic kidney disease    obstruction of R kidney, ( not a stone) - currently resolved    Depression    Diverticulosis    DJD (degenerative joint disease)    hands & back    Dyspnea    resolved since she stopped smoking     Epigastric abdominal pain    Esophageal stricture    Family history of adverse reaction to anesthesia    daughter has N&V, takes long time to wake up    Family history of colon cancer    Family history of ovarian cancer    Family hx of colon cancer    Female pelvic peritoneal adhesions 10/26/2012   Ganglion cyst    GERD (gastroesophageal reflux disease)    Headache    low grade currently , family history of migraines    Hemorrhoid    History of radiation therapy 05/03 - 08/11/2016   1. 4 field Right breast was treated to 50.4 Gy in 25 fractions at 1.8 Gy per fraction. 2. The Right breast was boosted to 10 Gy in 5 fractions at 2 Gy per fraction.   Hypertension    Hypothyroidism    Plantar fasciitis, bilateral    PONV (postoperative nausea and vomiting)    gets anxious with the mask on her face, also remarks that the scop. patch has helped in the past       ALLERGIES:  is allergic to adhesive [tape], promethazine hcl, and sulfa  antibiotics.  MEDICATIONS:  Current Outpatient Medications  Medication Sig Dispense Refill   zolpidem  (AMBIEN ) 10 MG tablet Take 1 tablet (10 mg total) by mouth at bedtime as needed for sleep. 30 tablet 0   ALPRAZolam  (XANAX ) 1 MG tablet Take 1 tablet by mouth 3 (three) times daily.     anastrozole  (ARIMIDEX ) 1 MG tablet TAKE 1 TABLET BY MOUTH  EVERY DAY 90 tablet 1   celecoxib  (CELEBREX ) 200 MG capsule Take 1 capsule (200 mg total) by mouth daily. 30 capsule 2   gabapentin  (NEURONTIN ) 100 MG capsule Take 1 capsule every day by oral route at dinner. (Patient taking differently: Take 100 mg by mouth as needed.)     IBRANCE  100 MG tablet TAKE 1 TABLET BY MOUTH 1 TIME A DAY ON DAYS 1 TO 21 OF A 28 DAY CYCLE 21 tablet 0   levothyroxine  (SYNTHROID , LEVOTHROID) 50 MCG tablet Take 50 mcg by mouth daily before breakfast.     omeprazole  (PRILOSEC) 40 MG capsule TAKE 1 CAPSULE BY MOUTH TWICE A DAY 180 capsule 2   ondansetron  (ZOFRAN ) 8 MG tablet TAKE 1 TABLET (8 MG  TOTAL) BY MOUTH EVERY 4 (FOUR) HOURS AS NEEDED FOR NAUSEA OR VOMITING. 135 tablet 1   oxyCODONE -acetaminophen  (PERCOCET/ROXICET) 5-325 MG tablet Take 1 tablet by mouth every 6 (six) hours as needed for severe pain (pain score 7-10). 45 tablet 0   potassium chloride  (KLOR-CON  M) 10 MEQ tablet Take 1 tablet (10 mEq total) by mouth 2 (two) times daily. 180 tablet 1   prochlorperazine  (COMPAZINE ) 10 MG tablet Take 1 tablet (10 mg total) by mouth every 6 (six) hours as needed for nausea or vomiting. 60 tablet 3   venlafaxine  XR (EFFEXOR -XR) 150 MG 24 hr capsule TAKE 1 CAPSULE BY MOUTH EVERY DAY WITH BREAKFAST 90 capsule 1   venlafaxine  XR (EFFEXOR -XR) 75 MG 24 hr capsule TAKE 1 CAPSULE BY MOUTH DAILY WITH BREAKFAST. 90 capsule 2   No current facility-administered medications for this visit.    VITAL SIGNS: BP 127/80 (BP Location: Left Arm, Patient Position: Sitting)   Pulse 84   Temp 97.7 F (36.5 C)   Resp 16   Wt 117 lb 9.6 oz (53.3 kg)   SpO2 100%   BMI 19.57 kg/m  Filed Weights   12/28/23 1337  Weight: 117 lb 9.6 oz (53.3 kg)    Estimated body mass index is 19.57 kg/m as calculated from the following:   Height as of 12/10/23: 5' 5 (1.651 m).   Weight as of this encounter: 117 lb 9.6 oz (53.3 kg).   PERFORMANCE STATUS (ECOG) : 1 - Symptomatic but completely ambulatory  Assessment NAD RRR Normal breathing pattern AAO x3  IMPRESSION: Discussed the use of AI scribe software for clinical note transcription with the patient, who gave verbal consent to proceed.  History of Present Illness [name] is a 63 year old female with metastatic breast cancer who presented to clinic for symptom management follow-up. No acute distress.    [name] experiences nausea, particularly after taking Ibrance , but finds it tolerable. She uses Zofran  for nausea however finds that Compazine  10 mg works better.   She reports occasional constipation, likely due to pain medication,  and plans to use gummies for stool softening. She is also taking biotin gummies for hair loss and thinning.   We discussed her pain at length. She is currently taking oxycodone  5 mg/325 mg as needed, Celebrex  200mg  especially during weather changes, which exacerbate her arthritis symptoms in her back and hands. She notes increased pain during recent weather events. Gabapentin  100mg , and Xanax  three times daily. [name] feels he Xanas may not be as effective as before, expressing concern about potential tolerance. Her primary care provider has been managing. Advised to contact their office and further discuss management.   Patient is taking Ambien  at bedtime and finds that  two tablets work better than one. Her insurance only covers a selected supply. We discussed this is most likely that she is taking 2 tablets. Education provided on increasing to 10mg  to prevent use of 2 tablets. She verbalized understanding.   We will continue to closely follow and support. All questions answered and support provided.    Assessment & Plan Pain  Pain fluctuates with level of activity.  Tolerating oxycodone  as needed. She experienced a fall resulting in bruising of the arm and a bump on the head. She reports soreness but is managing the symptoms. No severe injury requiring immediate intervention.No current issues with pain management reported. - Continue Oxycodone  5/325 mg every 6 hours as needed  Insomnia Managed with Ambien . Reports better sleep with two tablets of Ambien  5 mg, but insurance limits to 45 tablets per month. Prefers to switch to Ambien  10 mg for convenience. - Prescribed Ambien  10 mg for bedtime use.  Generalized anxiety disorder Generalized anxiety disorder is managed with Xanax . Advised patient to continue low-dose as prescribed adjustments as needed in the future based on PCP recommendations.   - Continue alprazolam  1 mg 3 times daily as prescribed by PCP.  Constipation Reports mild  constipation, likely related to pain medication use. Uses Miralax  and considering dietary changes with gummies for B12 and biotin. - Recommended Senna or Colace for constipation management.  Nausea Experiences mild nausea, particularly after taking Ibrance . Uses Zofran  for nausea management. Reports Compazine  10 mg is more effective than Zofran . - Prescribed Compazine  10 mg for nausea management.  Arthritis Experiences arthritis pain, particularly in the back and hands. Uses Celebrex  as needed for pain management. - Continue Celebrex  as needed for arthritis pain.  Nonscarring hair loss Reports hair loss and is taking biotin supplements. Concerned about biotin's efficacy but reassured by provider. - Continue biotin supplementation.  I will plan to see patient back in 3-4 weeks. Sooner if needed.   Patient expressed understanding and was in agreement with this plan. She also understands that She can call the clinic at any time with any questions, concerns, or complaints.   Any controlled substances utilized were prescribed in the context of palliative care. PDMP has been reviewed.   Visit consisted of counseling and education dealing with the complex and emotionally intense issues of symptom management and palliative care in the setting of serious and potentially life-threatening illness.  Levon Borer, AGPCNP-BC  Palliative Medicine Team/[facility]

## 2023-12-29 ENCOUNTER — Other Ambulatory Visit: Payer: Self-pay | Admitting: Nurse Practitioner

## 2023-12-29 ENCOUNTER — Encounter: Payer: Self-pay | Admitting: Hematology

## 2023-12-29 DIAGNOSIS — Z170 Estrogen receptor positive status [ER+]: Secondary | ICD-10-CM

## 2024-01-03 ENCOUNTER — Ambulatory Visit: Admitting: Neurology

## 2024-01-25 ENCOUNTER — Other Ambulatory Visit: Payer: Self-pay | Admitting: Nurse Practitioner

## 2024-01-25 DIAGNOSIS — Z170 Estrogen receptor positive status [ER+]: Secondary | ICD-10-CM

## 2024-02-01 ENCOUNTER — Other Ambulatory Visit: Payer: Self-pay

## 2024-02-01 DIAGNOSIS — G893 Neoplasm related pain (acute) (chronic): Secondary | ICD-10-CM

## 2024-02-01 DIAGNOSIS — Z515 Encounter for palliative care: Secondary | ICD-10-CM

## 2024-02-01 DIAGNOSIS — G4709 Other insomnia: Secondary | ICD-10-CM

## 2024-02-01 DIAGNOSIS — C50811 Malignant neoplasm of overlapping sites of right female breast: Secondary | ICD-10-CM

## 2024-02-01 MED ORDER — OXYCODONE-ACETAMINOPHEN 5-325 MG PO TABS: 1.0000 | ORAL_TABLET | Freq: Four times a day (QID) | ORAL | 0 refills | Status: DC | PRN

## 2024-02-09 ENCOUNTER — Inpatient Hospital Stay: Attending: Nurse Practitioner | Admitting: Nurse Practitioner

## 2024-02-09 ENCOUNTER — Encounter: Payer: Self-pay | Admitting: Nurse Practitioner

## 2024-02-09 DIAGNOSIS — Z515 Encounter for palliative care: Secondary | ICD-10-CM | POA: Diagnosis not present

## 2024-02-09 DIAGNOSIS — K219 Gastro-esophageal reflux disease without esophagitis: Secondary | ICD-10-CM

## 2024-02-09 DIAGNOSIS — F411 Generalized anxiety disorder: Secondary | ICD-10-CM

## 2024-02-09 DIAGNOSIS — R530 Neoplastic (malignant) related fatigue: Secondary | ICD-10-CM | POA: Diagnosis not present

## 2024-02-09 DIAGNOSIS — Z170 Estrogen receptor positive status [ER+]: Secondary | ICD-10-CM | POA: Diagnosis not present

## 2024-02-09 DIAGNOSIS — G893 Neoplasm related pain (acute) (chronic): Secondary | ICD-10-CM

## 2024-02-09 DIAGNOSIS — G4700 Insomnia, unspecified: Secondary | ICD-10-CM

## 2024-02-09 DIAGNOSIS — M792 Neuralgia and neuritis, unspecified: Secondary | ICD-10-CM

## 2024-02-09 DIAGNOSIS — Z87891 Personal history of nicotine dependence: Secondary | ICD-10-CM | POA: Diagnosis not present

## 2024-02-09 DIAGNOSIS — C50811 Malignant neoplasm of overlapping sites of right female breast: Secondary | ICD-10-CM | POA: Diagnosis not present

## 2024-02-09 DIAGNOSIS — G4709 Other insomnia: Secondary | ICD-10-CM

## 2024-02-09 DIAGNOSIS — F529 Unspecified sexual dysfunction not due to a substance or known physiological condition: Secondary | ICD-10-CM

## 2024-02-09 MED ORDER — ZOLPIDEM TARTRATE 10 MG PO TABS: 10.0000 mg | ORAL_TABLET | Freq: Every evening | ORAL | 3 refills | Status: DC | PRN

## 2024-02-09 MED ORDER — OXYCODONE-ACETAMINOPHEN 5-325 MG PO TABS: 1.0000 | ORAL_TABLET | Freq: Four times a day (QID) | ORAL | 0 refills | Status: DC | PRN

## 2024-02-09 NOTE — Progress Notes (Signed)
 Palliative Medicine [facility]  Telephone:(336) [phone] Fax:(336) [phone]   Name: [name] Date: 02/09/2024 MRN: [id number]  DOB: 1960/10/29  Patient Care Team: [name], FNP as PCP - General (Internal Medicine) Aplington, [name], MD (Inactive) (Orthopedic Surgery) [name], MD (Ophthalmology) [name], MD (Dermatology) [name], MD as Consulting Physician (Hematology) [name], MD as Consulting Physician (General Surgery) [name], MD as Consulting Physician (Radiation Oncology) [name], [name], NP as Nurse Practitioner (Hematology and Oncology) Pickenpack-Cousar, [name], NP as Nurse Practitioner (Hospice and Palliative Medicine) Patel, [name], DO as Consulting Physician (Neurology)   I connected with Suzen JONELLE Ada on 02/09/2024 at  1:30 PM EST by telephone and verified that I am speaking with the correct person using two identifiers.   I discussed the limitations, risks, security and privacy concerns of performing an evaluation and management service by telemedicine and the availability of in-person appointments. I also discussed with the patient that there may be a patient responsible charge related to this service. The patient expressed understanding and agreed to proceed.   Other persons participating in the visit and their role in the encounter: N/A   Patients location: Home   Providers location: [facility]   INTERVAL HISTORY: [name] is a 63 y.o. female with with oncologic medical history including metastatic breast cancer with diffuse bone lesions, currently on anastrozole , Zometa , and Kisqali .  Palliative is seeing patient for symptom management and goals of care.   SOCIAL HISTORY:     reports that she quit smoking about 7 years ago. Her smoking use included cigarettes. She started smoking about 22 years ago. She has a 7.5 pack-year smoking history. She has never used smokeless  tobacco. She reports current alcohol use of about 14.0 standard drinks of alcohol per week. She reports that she does not use drugs.  ADVANCE DIRECTIVES:  None on file   CODE STATUS: Full code  PAST MEDICAL HISTORY: Past Medical History:  Diagnosis Date   Anxiety    Anxiety disorder    Asthma    h/o asthma as a child   Breast cancer (HCC)    Cancer (HCC) 04/2016   right breast cancer   Cholelithiasis    Chronic kidney disease    obstruction of R kidney, ( not a stone) - currently resolved    Depression    Diverticulosis    DJD (degenerative joint disease)    hands & back    Dyspnea    resolved since she stopped smoking    Epigastric abdominal pain    Esophageal stricture    Family history of adverse reaction to anesthesia    daughter has N&V, takes long time to wake up    Family history of colon cancer    Family history of ovarian cancer    Family hx of colon cancer    Female pelvic peritoneal adhesions 10/26/2012   Ganglion cyst    GERD (gastroesophageal reflux disease)    Headache    low grade currently , family history of migraines    Hemorrhoid    History of radiation therapy 05/03 - 08/11/2016   1. 4 field Right breast was treated to 50.4 Gy in 25 fractions at 1.8 Gy per fraction. 2. The Right breast was boosted to 10 Gy in 5 fractions at 2 Gy per fraction.   Hypertension    Hypothyroidism    Plantar fasciitis, bilateral    PONV (postoperative nausea  and vomiting)    gets anxious with the mask on her face, also remarks that the scop. patch has helped in the past       ALLERGIES:  is allergic to adhesive [tape], promethazine hcl, and sulfa  antibiotics.  MEDICATIONS:  Current Outpatient Medications  Medication Sig Dispense Refill   ALPRAZolam  (XANAX ) 1 MG tablet Take 1 tablet by mouth 3 (three) times daily.     anastrozole  (ARIMIDEX ) 1 MG tablet TAKE 1 TABLET BY MOUTH EVERY DAY 90 tablet 1   celecoxib  (CELEBREX ) 200 MG capsule Take 1 capsule (200 mg total) by  mouth daily. 30 capsule 2   gabapentin  (NEURONTIN ) 100 MG capsule Take 1 capsule every day by oral route at dinner. (Patient taking differently: Take 100 mg by mouth as needed.)     IBRANCE  100 MG tablet TAKE 1 TABLET BY MOUTH 1 TIME A DAY ON DAYS 1 TO 21 OF A 28 DAY CYCLE 21 tablet 0   levothyroxine  (SYNTHROID , LEVOTHROID) 50 MCG tablet Take 50 mcg by mouth daily before breakfast.     omeprazole  (PRILOSEC) 40 MG capsule TAKE 1 CAPSULE BY MOUTH TWICE A DAY 180 capsule 2   ondansetron  (ZOFRAN ) 8 MG tablet TAKE 1 TABLET (8 MG TOTAL) BY MOUTH EVERY 4 (FOUR) HOURS AS NEEDED FOR NAUSEA OR VOMITING. 135 tablet 1   [START ON 02/12/2024] oxyCODONE -acetaminophen  (PERCOCET/ROXICET) 5-325 MG tablet Take 1 tablet by mouth every 6 (six) hours as needed for severe pain (pain score 7-10). 60 tablet 0   potassium chloride  (KLOR-CON  M) 10 MEQ tablet Take 1 tablet (10 mEq total) by mouth 2 (two) times daily. 180 tablet 1   prochlorperazine  (COMPAZINE ) 10 MG tablet Take 1 tablet (10 mg total) by mouth every 6 (six) hours as needed for nausea or vomiting. 60 tablet 3   venlafaxine  XR (EFFEXOR -XR) 150 MG 24 hr capsule TAKE 1 CAPSULE BY MOUTH EVERY DAY WITH BREAKFAST 90 capsule 1   venlafaxine  XR (EFFEXOR -XR) 75 MG 24 hr capsule TAKE 1 CAPSULE BY MOUTH DAILY WITH BREAKFAST. 90 capsule 2   zolpidem  (AMBIEN ) 10 MG tablet Take 1 tablet (10 mg total) by mouth at bedtime as needed for sleep. 30 tablet 3   No current facility-administered medications for this visit.    VITAL SIGNS: There were no vitals taken for this visit. There were no vitals filed for this visit.   Estimated body mass index is 19.57 kg/m as calculated from the following:   Height as of 12/10/23: 5' 5 (1.651 m).   Weight as of 12/28/23: 117 lb 9.6 oz (53.3 kg).   PERFORMANCE STATUS (ECOG) : 1 - Symptomatic but completely ambulatory  IMPRESSION: Discussed the use of AI scribe software for clinical note transcription with the patient, who gave  verbal consent to proceed.  History of Present Illness [name] is a 63 year old female with metastatic breast cancer who I connected with by phone for symptom management follow-up. No acute distress. Denies concerns of nausea, vomiting, constipation, or diarrhea.  She has acid reflux and is taking Prilosec as prescribed due to increased stomach burning.   She describes a new onset of painful sensations in her breasts, initially starting in the right breast and now affecting the left, particularly around the perceived nipple area. This has been occurring for about a week. Confirms she is taking her gabapentin  as prescribed for neuropathic discomfort.   She discusses concerns about menopausal symptoms and hormone levels, noting that she has not  had a total hysterectomy and her ovaries were not detected during an ultrasound. She is experiencing decreased libido and vaginal dryness, which she attributes to her medical history and treatments. We discussed use of non-estrogen based over-the-counter options for lubrication and natural methods for increased libido such as manuka honey.   We discussed her pain at length. [name] reports she experiences increased pain, particularly during extended work hours, and finds her current pain medication insufficient. She is currently taking oxycodone  5 mg/325 mg as needed, Celebrex  200mg  especially during weather changes, which exacerbate her arthritis symptoms in her back and hands. Gabapentin  100mg , and Xanax  three times daily. We will continue with current regimen and further discuss etiology and need for adjustments at upcoming visit in January. Will determine potential causes for sudden increase in pain as she will be seeing Oncologist as well.   We will continue to closely follow and support. All questions answered and support provided.    Assessment & Plan Cancer Related Pain  Pain fluctuates with level of activity.  Tolerating oxycodone  as  needed. Reports increase in pain. No adjustments at this time.  - Continue Oxycodone  5/325 mg every 6 hours as needed  Insomnia Managed with Ambien . Reports better sleep with two tablets of Ambien  5 mg, but insurance limits to 45 tablets per month. Prefers to switch to Ambien  10 mg for convenience. - Continue Ambien  10 mg for bedtime use.  Generalized anxiety disorder Generalized anxiety disorder is managed with Xanax . Advised patient to continue low-dose as prescribed adjustments as needed in the future based on PCP recommendations.   - Continue alprazolam  1 mg 3 times daily as prescribed by PCP.  Malignant neoplasm of overlapping sites of right breast, estrogen receptor positive Estrogen receptor positive breast cancer with concerns about recurrence and hormonal management. Discussion about the role of ovaries in hormone management and potential impact on cancer recurrence. - Continue current management and monitoring. Scheduled for follow-up with Oncology in January. Knows to contact office sooner with significant changes.   Neoplasm-related pain and post-mastectomy neuropathic pain Experiencing neuropathic pain post-mastectomy, initially in the right breast, now also in the left breast. Pain described as a sensation in the nipple area, likely nerve-related. Gabapentin  is being used for management. - Continue gabapentin  as needed for neuropathic pain.  Ongoing palliative care management with focus on symptom control and quality of life. Discussion about medication adjustments and need for in-person evaluation before making changes. - Scheduled follow-up appointment on January 19th for in-person evaluation and potential medication adjustments.  Gastroesophageal reflux disease Experiencing increased stomach burning, requiring doubling or tripling of Prilosec dosage. No aspirin-based medications involved, current pain medication is Tylenol -based. - Continue Prilosec as needed for GERD  symptoms.  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 Estrogen-containing products are contraindicated due to cancer history. Discussion about non-estrogen lubricants and potential natural remedies like Manuka honey. - Use over-the-counter non-estrogen lubricants for vaginal dryness. - Consider Manuka honey as a natural remedy for libido enhancement.  Patient expressed understanding and was in agreement with this plan. She also understands that She can call the clinic at any time with any questions, concerns, or complaints.   Any controlled substances utilized were prescribed in the context of palliative care. PDMP has been reviewed.   I provided 30 minutes of non face-to-face telephone visit time during this encounter, and > 50% was spent counseling as documented under my assessment &  plan. Visit consisted of counseling and education dealing with the complex and emotionally intense issues of symptom management and palliative care in the setting of serious and potentially life-threatening illness.  Levon Borer, AGPCNP-BC  Palliative Medicine Team/[facility]

## 2024-02-16 ENCOUNTER — Other Ambulatory Visit: Payer: Self-pay | Admitting: Nurse Practitioner

## 2024-02-16 DIAGNOSIS — R232 Flushing: Secondary | ICD-10-CM

## 2024-02-28 ENCOUNTER — Other Ambulatory Visit: Payer: Self-pay | Admitting: Nurse Practitioner

## 2024-03-01 ENCOUNTER — Other Ambulatory Visit: Payer: Self-pay | Admitting: Hematology

## 2024-03-01 DIAGNOSIS — Z170 Estrogen receptor positive status [ER+]: Secondary | ICD-10-CM

## 2024-03-07 ENCOUNTER — Other Ambulatory Visit: Payer: Self-pay

## 2024-03-07 ENCOUNTER — Telehealth: Payer: Self-pay

## 2024-03-07 NOTE — Telephone Encounter (Signed)
 Received telephone call from the patient regardingher upcoming appt, 03/13/24. Patient inquiring if there will be any labs drawn to determine cancer staging / prognosis. Let patient know that the CA 27.29 blood test will be drawn that same day. Patient inquiring if the result of that test will be resulted in time for her office visit w/ Dr. Lanny. Let patient know that the turn-around time for that particular test is up to 24h. Offered to move patient's lab appt to the day prior to her office visit-explained this would ensure that Dr. Lanny will have the results of her blood work to discuss w/ her at her 03/13/24 office visit. Patient declined due to being a hard stick etc. Patient requesting for Dr. Lanny to give her a call the following day to discuss CA 27.29 lab result. Let patient know that I would forward her message request to Dr. Lanny so that she is aware.

## 2024-03-12 NOTE — Assessment & Plan Note (Signed)
"   mpT2 pN1a, stage IA, G1,  ER positive, PR positive, HER-2 negative, mammaprint low risk luminal type A, node and bone metastasis in 03/2023 -Diagnosed in 02/2016. S/p bilateral mastectomy with reconstruction and adjuvant radiation. (Implants ultimately removed 01/24/19 by Dr. Arelia.) -She started antiestrogen therapy Exemestane  on 04/01/16, planned for 7 years. She experienced joint pain and hot flashes on exemestane , anastrozole , and tamoxifen . She opted to continue anastrozole . Given her continued difficulty with joint pain, as well as her low risk mammaprint, she stopped after she completed 5 years therapy in 03/2021 -she developed low appetite and weight loss in Feb 2025, CT and bone scan showed bone lesions. PET on 06/01/2023 showed diffuse hypermetabolic bone lesions and hypermetabolic small lymph node in the right axillary region and supraclavicular region on the right low neck, concerning for metastatic disease. -[place] node biopsy on 06/22/2023 confirmed metastatic breast cancer, will request NGS  -she started anastrozole  in 05/2023, Ribciclib and zometa  was added in May 2025.  Due to poor tolerance, ribociclib  was subsequently changed to palbociclib . "

## 2024-03-13 ENCOUNTER — Inpatient Hospital Stay

## 2024-03-13 ENCOUNTER — Inpatient Hospital Stay: Admitting: Nurse Practitioner

## 2024-03-13 ENCOUNTER — Inpatient Hospital Stay: Attending: Nurse Practitioner

## 2024-03-13 ENCOUNTER — Encounter: Payer: Self-pay | Admitting: Nurse Practitioner

## 2024-03-13 ENCOUNTER — Inpatient Hospital Stay: Admitting: Hematology

## 2024-03-13 VITALS — BP 122/72 | HR 80 | Temp 98.0°F | Resp 18

## 2024-03-13 VITALS — BP 123/82 | HR 88 | Temp 97.8°F | Resp 17 | Wt 117.9 lb

## 2024-03-13 DIAGNOSIS — Z170 Estrogen receptor positive status [ER+]: Secondary | ICD-10-CM

## 2024-03-13 DIAGNOSIS — Z923 Personal history of irradiation: Secondary | ICD-10-CM | POA: Insufficient documentation

## 2024-03-13 DIAGNOSIS — R7989 Other specified abnormal findings of blood chemistry: Secondary | ICD-10-CM | POA: Diagnosis not present

## 2024-03-13 DIAGNOSIS — G4709 Other insomnia: Secondary | ICD-10-CM

## 2024-03-13 DIAGNOSIS — Z79811 Long term (current) use of aromatase inhibitors: Secondary | ICD-10-CM | POA: Diagnosis not present

## 2024-03-13 DIAGNOSIS — C50811 Malignant neoplasm of overlapping sites of right female breast: Secondary | ICD-10-CM

## 2024-03-13 DIAGNOSIS — Z9013 Acquired absence of bilateral breasts and nipples: Secondary | ICD-10-CM | POA: Diagnosis not present

## 2024-03-13 DIAGNOSIS — G893 Neoplasm related pain (acute) (chronic): Secondary | ICD-10-CM

## 2024-03-13 DIAGNOSIS — T451X5A Adverse effect of antineoplastic and immunosuppressive drugs, initial encounter: Secondary | ICD-10-CM | POA: Insufficient documentation

## 2024-03-13 DIAGNOSIS — R110 Nausea: Secondary | ICD-10-CM | POA: Insufficient documentation

## 2024-03-13 DIAGNOSIS — M816 Localized osteoporosis [Lequesne]: Secondary | ICD-10-CM

## 2024-03-13 DIAGNOSIS — Z79899 Other long term (current) drug therapy: Secondary | ICD-10-CM | POA: Diagnosis not present

## 2024-03-13 DIAGNOSIS — C50011 Malignant neoplasm of nipple and areola, right female breast: Secondary | ICD-10-CM

## 2024-03-13 DIAGNOSIS — C773 Secondary and unspecified malignant neoplasm of axilla and upper limb lymph nodes: Secondary | ICD-10-CM | POA: Insufficient documentation

## 2024-03-13 DIAGNOSIS — C7951 Secondary malignant neoplasm of bone: Secondary | ICD-10-CM | POA: Insufficient documentation

## 2024-03-13 DIAGNOSIS — Z515 Encounter for palliative care: Secondary | ICD-10-CM

## 2024-03-13 LAB — CMP (CANCER CENTER ONLY)
ALT: 95 U/L — ABNORMAL HIGH (ref 0–44)
AST: 118 U/L — ABNORMAL HIGH (ref 15–41)
Albumin: 4.3 g/dL (ref 3.5–5.0)
Alkaline Phosphatase: 83 U/L (ref 38–126)
Anion gap: 12 (ref 5–15)
BUN: 14 mg/dL (ref 8–23)
CO2: 25 mmol/L (ref 22–32)
Calcium: 9.3 mg/dL (ref 8.9–10.3)
Chloride: 103 mmol/L (ref 98–111)
Creatinine: 0.84 mg/dL (ref 0.44–1.00)
GFR, Estimated: >60 mL/min
Glucose, Bld: 99 mg/dL (ref 70–99)
Potassium: 4 mmol/L (ref 3.5–5.1)
Sodium: 140 mmol/L (ref 135–145)
Total Bilirubin: 0.8 mg/dL (ref 0.0–1.2)
Total Protein: 6.8 g/dL (ref 6.5–8.1)

## 2024-03-13 LAB — CBC WITH DIFFERENTIAL (CANCER CENTER ONLY)
Abs Immature Granulocytes: 0 K/uL (ref 0.00–0.07)
Basophils Absolute: 0.1 K/uL (ref 0.0–0.1)
Basophils Relative: 5 %
Eosinophils Absolute: 0.1 K/uL (ref 0.0–0.5)
Eosinophils Relative: 4 %
HCT: 40.5 % (ref 36.0–46.0)
Hemoglobin: 14.3 g/dL (ref 12.0–15.0)
Immature Granulocytes: 0 %
Lymphocytes Relative: 50 %
Lymphs Abs: 1.2 K/uL (ref 0.7–4.0)
MCH: 36.3 pg — ABNORMAL HIGH (ref 26.0–34.0)
MCHC: 35.3 g/dL (ref 30.0–36.0)
MCV: 102.8 fL — ABNORMAL HIGH (ref 80.0–100.0)
Monocytes Absolute: 0.3 K/uL (ref 0.1–1.0)
Monocytes Relative: 14 %
Neutro Abs: 0.6 K/uL — ABNORMAL LOW (ref 1.7–7.7)
Neutrophils Relative %: 27 %
Platelet Count: 37 K/uL — ABNORMAL LOW (ref 150–400)
RBC: 3.94 MIL/uL (ref 3.87–5.11)
RDW: 14.2 % (ref 11.5–15.5)
WBC Count: 2.3 K/uL — ABNORMAL LOW (ref 4.0–10.5)
nRBC: 0 % (ref 0.0–0.2)

## 2024-03-13 MED ORDER — ZOLPIDEM TARTRATE 10 MG PO TABS: 10.0000 mg | ORAL_TABLET | Freq: Every evening | ORAL | 3 refills | Status: AC | PRN

## 2024-03-13 MED ORDER — OXYCODONE-ACETAMINOPHEN 5-325 MG PO TABS: 1.0000 | ORAL_TABLET | Freq: Four times a day (QID) | ORAL | 0 refills | Status: AC | PRN

## 2024-03-13 MED ORDER — PALBOCICLIB 125 MG PO TABS: 125.0000 mg | ORAL_TABLET | Freq: Every day | ORAL | 0 refills | Status: DC

## 2024-03-13 MED ORDER — ZOLEDRONIC ACID 4 MG/100ML IV SOLN
4.0000 mg | Freq: Once | INTRAVENOUS | Status: AC
  Administered 2024-03-13: 4 mg via INTRAVENOUS
  Filled 2024-03-13: qty 100

## 2024-03-13 MED ORDER — SODIUM CHLORIDE 0.9 % IV SOLN: INTRAVENOUS | Status: DC

## 2024-03-13 MED ORDER — ANASTROZOLE 1 MG PO TABS: 1.0000 mg | ORAL_TABLET | Freq: Every day | ORAL | 3 refills | Status: AC

## 2024-03-13 NOTE — Progress Notes (Signed)
 "    Palliative Medicine [facility]  [phone] [phone]   Name: [name] Date: 03/13/2024 MRN: [id number]  DOB: [birth year]  Patient Care Team: [name], FNP as PCP - General (Internal Medicine) Aplington, [name], MD (Inactive) (Orthopedic Surgery) [name], MD (Ophthalmology) [name], MD (Dermatology) [name], MD as Consulting Physician (Hematology) [name], MD as Consulting Physician (General Surgery) [name], MD as Consulting Physician (Radiation Oncology) [name], [name], NP as Nurse Practitioner (Hematology and Oncology) Pickenpack-Cousar, [name], NP as Nurse Practitioner (Hospice and Palliative Medicine) Patel, [name], DO as Consulting Physician (Neurology)   INTERVAL HISTORY: [name] is a 64 y.o. female with with oncologic medical history including metastatic breast cancer with diffuse bone lesions, currently on anastrozole , Zometa , and Kisqali .  Palliative is seeing patient for symptom management and goals of care.   SOCIAL HISTORY:     reports that she quit smoking about 7 years ago. Her smoking use included cigarettes. She started smoking about 22 years ago. She has a 7.5 pack-year smoking history. She has never used smokeless tobacco. She reports current alcohol use of about 14.0 standard drinks of alcohol per week. She reports that she does not use drugs.  ADVANCE DIRECTIVES:  None on file   CODE STATUS: Full code  PAST MEDICAL HISTORY: Past Medical History:  Diagnosis Date   Anxiety    Anxiety disorder    Asthma    h/o asthma as a child   Breast cancer (HCC)    Cancer (HCC) 04/2016   right breast cancer   Cholelithiasis    Chronic kidney disease    obstruction of R kidney, ( not a stone) - currently resolved    Depression    Diverticulosis    DJD (degenerative joint disease)    hands & back    Dyspnea    resolved since she stopped smoking     Epigastric abdominal pain    Esophageal stricture    Family history of adverse reaction to anesthesia    daughter has N&V, takes long time to wake up    Family history of colon cancer    Family history of ovarian cancer    Family hx of colon cancer    Female pelvic peritoneal adhesions 10/26/2012   Ganglion cyst    GERD (gastroesophageal reflux disease)    Headache    low grade currently , family history of migraines    Hemorrhoid    History of radiation therapy 05/03 - 08/11/2016   1. 4 field Right breast was treated to 50.4 Gy in 25 fractions at 1.8 Gy per fraction. 2. The Right breast was boosted to 10 Gy in 5 fractions at 2 Gy per fraction.   Hypertension    Hypothyroidism    Plantar fasciitis, bilateral    PONV (postoperative nausea and vomiting)    gets anxious with the mask on her face, also remarks that the scop. patch has helped in the past       ALLERGIES:  is allergic to adhesive [tape], promethazine hcl, and sulfa  antibiotics.  MEDICATIONS:  Current Outpatient Medications  Medication Sig Dispense Refill   ALPRAZolam  (XANAX ) 1 MG tablet Take 1 tablet by mouth 3 (three) times daily.     anastrozole  (ARIMIDEX ) 1 MG tablet Take 1 tablet (1 mg total) by mouth daily. 90 tablet 3   celecoxib  (CELEBREX ) 200 MG capsule Take 1 capsule (200 mg total) by mouth daily.  30 capsule 2   gabapentin  (NEURONTIN ) 100 MG capsule Take 1 capsule every day by oral route at dinner. (Patient taking differently: Take 100 mg by mouth as needed.)     levothyroxine  (SYNTHROID , LEVOTHROID) 50 MCG tablet Take 50 mcg by mouth daily before breakfast.     omeprazole  (PRILOSEC) 40 MG capsule TAKE 1 CAPSULE BY MOUTH TWICE A DAY 180 capsule 2   ondansetron  (ZOFRAN ) 8 MG tablet TAKE 1 TABLET (8 MG TOTAL) BY MOUTH EVERY 4 (FOUR) HOURS AS NEEDED FOR NAUSEA OR VOMITING. 135 tablet 1   oxyCODONE -acetaminophen  (PERCOCET/ROXICET) 5-325 MG tablet Take 1 tablet by mouth every 6 (six) hours as needed for severe pain  (pain score 7-10). 60 tablet 0   palbociclib  (IBRANCE ) 125 MG tablet Take 1 tablet (125 mg total) by mouth daily. Take for 21 days on, 7 days off, repeat every 28 days. 21 tablet 0   potassium chloride  (KLOR-CON  M) 10 MEQ tablet TAKE 1 TABLET BY MOUTH 2 TIMES DAILY. 180 tablet 0   prochlorperazine  (COMPAZINE ) 10 MG tablet Take 1 tablet (10 mg total) by mouth every 6 (six) hours as needed for nausea or vomiting. 60 tablet 3   venlafaxine  XR (EFFEXOR -XR) 150 MG 24 hr capsule TAKE 1 CAPSULE BY MOUTH EVERY DAY WITH BREAKFAST 90 capsule 1   venlafaxine  XR (EFFEXOR -XR) 75 MG 24 hr capsule TAKE 1 CAPSULE BY MOUTH DAILY WITH BREAKFAST. 90 capsule 2   zolpidem  (AMBIEN ) 10 MG tablet Take 1 tablet (10 mg total) by mouth at bedtime as needed for sleep. 30 tablet 3   No current facility-administered medications for this visit.   Facility-Administered Medications Ordered in Other Visits  Medication Dose Route Frequency Provider Last Rate Last Admin   0.9 %  sodium chloride  infusion   Intravenous Continuous [name], MD   Stopped at 03/13/24 1206    VITAL SIGNS: There were no vitals taken for this visit. There were no vitals filed for this visit.   Estimated body mass index is 19.62 kg/m as calculated from the following:   Height as of 12/10/23: 5' 5 (1.651 m).   Weight as of an earlier encounter on 03/13/24: 117 lb 14.4 oz (53.5 kg).   PERFORMANCE STATUS (ECOG) : 1 - Symptomatic but completely ambulatory  Assessment  NAD RRR Normal breathing pattern AAO x 4  IMPRESSION: Discussed the use of AI scribe software for clinical note transcription with the patient, who gave verbal consent to proceed.  History of Present Illness [name] is a 64 year old female with metastatic breast cancer who was seen during her infusion for symptom management follow-up.  She is accompanied by her husband Tod.  No acute distress noted.    Appetite is good.  Weight is stable at 117 pounds. She  experiences constipation and has been using over-the-counter remedies without success. MiraLax  has been tried but not consistently, and she has not experienced relief from constipation with her current regimen.  Patient reports having to use Fleet enema for some relief.  Education provided on bowel regimen in the setting of opioid use.  I have recommended patient consistently take MiraLAX  1-2 times daily.  She is aware of no improvement in bowel pattern we will consider other options.  She uses Compazine  10 mg for nausea, which she takes 30 minutes before her other medications to prevent nausea.  States this medication works better than the Zofran .  We discussed her pain at length.  [name] reports pain is  well-controlled on current regimen which consist of Celebrex  200 mg daily, gabapentin  100 mg as needed, oxycodone  5/325 mg one tablet every six hours as needed.  She is also taking Xanax  1 mg three times a day as managed by her PCP.  Reports good sleep habits with her current dose of Ambien  10 mg at bedtime.  No adjustments to medications at this time.  Patient is taking medications as prescribed.  She reports that she is taking 25 mg of her thyroid  medication, although her records list 50 mg.  Request for this to be updated for accuracy.  All questions answered and support provided.   Assessment & Plan Cancer related pain Well-controlled with oxycodone  5/325 mg as needed. No changes in pain levels reported.  Patient taking medication responsibly. - Continue oxycodone  5/325 mg every 6 hours as needed.  Refill has been sent to pharmacy as requested.  Constipation Over-the-counter MiraLax  was ineffective when used inconsistently. - Increase MiraLax  to twice daily. - If no improvement after one week, send a MyChart message for further evaluation.  Nausea Managed with Compazine  10 mg, preferred for its quicker action compared to Zofran . - Continue Compazine  10 mg as needed for  nausea.  Hypothyroidism Managed with medication. Current dosage discrepancy noted, with a plan to adjust to 25 mg. - Adjusted thyroid  medication to 25 mg and chart to reflect accuracy of daily use.  I will plan to see patient back in 4-6 weeks.  Sooner if needed.  Patient expressed understanding and was in agreement with this plan. She also understands that She can call the clinic at any time with any questions, concerns, or complaints.   Any controlled substances utilized were prescribed in the context of palliative care. PDMP has been reviewed.   I personally spent a total of 30 minutes in the care of the patient today including preparing to see the patient, getting/reviewing separately obtained history, performing a medically appropriate exam/evaluation, counseling and educating, placing orders, and documenting clinical information in the EHR.  Visit consisted of counseling and education dealing with the complex and emotionally intense issues of symptom management and palliative care in the setting of serious and potentially life-threatening illness.  Levon Borer, AGPCNP-BC  Palliative Medicine Team/[facility]    "

## 2024-03-13 NOTE — Progress Notes (Signed)
 " [facility]   [phone]) [phone] [phone]   Clinic Follow up Note   Patient Care Team: [name], FNP as PCP - General (Internal Medicine) Aplington, [name], MD (Inactive) (Orthopedic Surgery) [name], MD (Ophthalmology) [name], MD (Dermatology) [name], MD as Consulting Physician (Hematology) [name], MD as Consulting Physician (General Surgery) [name], MD as Consulting Physician (Radiation Oncology) [name], [name], NP as Nurse Practitioner (Hematology and Oncology) Pickenpack-Cousar, [name], NP as Nurse Practitioner (Hospice and Palliative Medicine) Tobie Tonita POUR, DO as Consulting Physician (Neurology)  Date of Service:  03/13/2024  CHIEF COMPLAINT: f/u of metastatic breast cancer  CURRENT THERAPY:  Anastrozole  and Ibrance   Oncology History   Malignant neoplasm of overlapping sites of right breast in female, estrogen receptor positive (HCC)  mpT2 pN1a, stage IA, G1,  ER positive, PR positive, HER-2 negative, mammaprint low risk luminal type A, node and bone metastasis in 03/2023 -Diagnosed in 02/2016. S/p bilateral mastectomy with reconstruction and adjuvant radiation. (Implants ultimately removed 01/24/19 by Dr. Arelia.) -She started antiestrogen therapy Exemestane  on 04/01/16, planned for 7 years. She experienced joint pain and hot flashes on exemestane , anastrozole , and tamoxifen . She opted to continue anastrozole . Given her continued difficulty with joint pain, as well as her low risk mammaprint, she stopped after she completed 5 years therapy in 03/2021 -she developed low appetite and weight loss in Feb 2025, CT and bone scan showed bone lesions. PET on 06/01/2023 showed diffuse hypermetabolic bone lesions and hypermetabolic small lymph node in the right axillary region and supraclavicular region on the right low neck, concerning for metastatic disease. 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 Disease remains incurable but treatable, with good response to therapy based on recent imaging and tumor marker trends. Staging remains unchanged. She tolerates current therapy with manageable side effects. ECOG performance status: ambulatory, capable of self-care, fatigued with moderate activity, requires breaks. - Ordered PET scan in March for disease monitoring and restaging. - Ordered laboratory tests including tumor markers and blood counts for ongoing assessment. - Continued current therapy regimen with Ibrance  dose escalation to 125 mg for next cycle; instructed to report intolerance. - [facility]illed chemotherapy medication [facility]. - Refilled oxycodone  for pain management. - Scheduled follow-up in two months with labs and PET scan one week prior. - Continued Zemaira infusion every three months for bone health.  Cancer-related weight loss and muscle wasting Significant weight loss from 170 lbs to 118 lbs with associated sarcopenia, likely multifactorial from malignancy and prior therapy. Weight has stabilized, but poor appetite and decreased muscle mass persist. - Encouraged high protein, high calorie diet and nutritional supplements (Ensure, Boost, homemade protein shakes). - Advised exercise regimen: walking, stationary bike, swimming, light weight training (=5-10 lbs) to minimize fracture risk due to bone metastases. - Provided anticipatory guidance on maintaining activity to preserve muscle mass.  Chemotherapy-induced nausea Mild nausea with current chemotherapy, less severe than prior regimens. Symptoms managed with antiemetic therapy; prefers 10 mg dose for optimal control. -  Continued current antiemetic regimen at 10 mg dose as preferred.  Abnormal liver function tests Mildly elevated liver enzymes, improved from prior measurements. - Monitored liver function with recent laboratory evaluation. - Provided reassurance  regarding improvement in hepatic enzymes.  Plan - She is tolerating treatment well, will increase Ibrance  from 100 mg to 125 mg daily, on day 1-21 every 28 days, starting next cycle -Will proceed with Zometa  infusion today and continue every 3 months -f/u in 2 months with lab and PET one week before  - I encouraged her to exercise regularly.   SUMMARY OF ONCOLOGIC HISTORY: Oncology History Overview Note  Cancer Staging Malignant neoplasm of overlapping sites of right breast in female, estrogen receptor positive (HCC) Staging form: Breast, AJCC 8th Edition - Clinical stage from 03/23/2016: Stage IIA (cT3, cN1, cM0, G2, ER: Positive, PR: Positive, HER2: Negative) - Signed by Onita Mattock, MD on 03/31/2016 - Pathologic stage from 05/19/2016: Stage IA (pT2(m), pN1a, cM0, G1, ER: Positive, PR: Positive, HER2: Negative) - Signed by Onita Mattock, MD on 06/18/2016     Malignant neoplasm of overlapping sites of right breast in female, estrogen receptor positive (HCC)  03/17/2016 Mammogram   B/l diagnostic mammogram and righ US  showed a 3.6cm irregular mass in the right breast 11:00 position, posterior depth, there is a enlarged lymph node in the right axilla is highly suspicious for malignancy. additional 7 mm oval mass in the right breast lower outer quadrant is suspicious for malignancy.   03/23/2016 Initial Biopsy   Right breast 9:30 position biopsy showed invasive ductal carcinoma, grade 1. 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   03/23/2016 Receptors her2   Both breast and node biopsy tumor ER 100% positive, PR 70-95% positive, HER-2 negative, Ki-67 40%   03/23/2016 Initial Diagnosis   Malignant neoplasm of upper-outer quadrant of  right breast in female, estrogen receptor positive (HCC)   03/25/2016 Initial Biopsy   Right breast 11:00 position core needle biopsy showed invasive duct carcinoma, grade 2.    03/25/2016 Receptors her2   ER 95% positive, PR 90% positive, HER-2 negative, Ki-67 15%   03/25/2016 Miscellaneous   Mammaprint showed low risk type A with index +0.105   03/30/2016 Imaging   Bilateral breast MRI with and without contrast showed a large lobulated enhancing mass within the upper-outer and lower outer right breast with surrounding nodularity, measuring 6.1 x 4.4 x 5.6 cm. Multiple critically sick and right axillary lymph nodes are demonstrated measuring up to 1.5 cm.    04/01/2016 -  Anti-estrogen oral therapy   Exemestane  25 mg daily, plan for 7 years. Switched to Anastrozole  1mg  in 06/2017 due to joint pain and hot flashes. Due to persistent side effects I changed her to Tamoxifen  in 09/2018. She stopped Tamoxifen  and switched back to anastrozole  in 01/2019 because it was more tolerable.    05/19/2016 Surgery   Bilateral mastectomy and right axillary regional lymph node resection.   05/19/2016 Pathology Results   -Right axillary regional lymph node resection revealed metastatic carcinoma in 2/7 lymph nodes. -Left simple mastectomy revealed lobular neoplasia and fibrocystic changes with adenosis and calcifications. 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 -Skin of the right mastectomy flap was benign. -mpT2, pN1a   06/25/2016 - 08/11/2016 Radiation Therapy   Site/dose:    1. 4 field Right breast was treated to 50.4 Gy in 25 fractions at 1.8 Gy per fraction. 2. The Right breast was boosted to 10 Gy in 5 fractions at 2 Gy per fraction.   04/15/2017 Genetic Testing  The patient had genetic testing  due to a personal history of breast cancer and family history of breast, ovarian, and colon cancer. The Common Hereditary Cancer Panel was ordered.  The Common Hereditary Cancer Panel offered by [facility] includes sequencing and/or deletion duplication testing of the following 47 genes: APC, ATM, AXIN2, BARD1, BMPR1A, BRCA1, BRCA2, BRIP1, CDH1, CDKN2A (p14ARF), CDKN2A (p16INK4a), CKD4, CHEK2, CTNNA1, DICER1, EPCAM (Deletion/duplication testing only), GREM1 (promoter region deletion/duplication testing only), KIT, MEN1, MLH1, MSH2, MSH3, MSH6, MUTYH, NBN, NF1, NHTL1, PALB2, PDGFRA, PMS2, POLD1, POLE, PTEN, RAD50, RAD51C, RAD51D, SDHB, SDHC, SDHD, SMAD4, SMARCA4. STK11, TP53, TSC1, TSC2, and VHL.  The following genes were evaluated for sequence changes only: SDHA and HOXB13 c.251G>A variant only.  Results: Negative, no pathogenic variants identified. The date of this test report is 04/15/2017   06/28/2017 Surgery   COMPLEX REVISION OF BACK SCAR and LEFT BREAST RECONSTRUCTION WITH SILICONE IMPLANT EXCHANGE AND ACELLULARDERMIS TO LEFT CHEST by Dr. Arelia 06/28/17   06/13/2023 Pathology Results   FINAL MICROSCOPIC DIAGNOSIS: A. LYMPH NODE, BIOPSY: -  Metastatic carcinoma morphologically consistent with the patient's known breast primary. ADDENDUM:  An immunohistochemical stain for GATA3 is positive supporting the morphological and clinical impression of breast primary. ADDENDUM: Lymph node, biopsy - Metastatic carcinoma  PROGNOSTIC INDICATORS Results:  IMMUNOHISTOCHEMICAL AND MORPHOMETRIC ANALYSIS PERFORMED MANUALLY  The tumor cells are negative for Her2 (1+). Estrogen Receptor:  70%, positive, strong staining intensity Progesterone Receptor:  80%, positive, strong staining intensity Proliferation Marker Ki67: 20% COMMENT:  The negative hormone receptor study(ies) in this case has an internal positive control.  REFERENCE RANGE ESTROGEN RECEPTOR        NEGATIVE     0%        POSITIVE        =>1% REFERENCE RANGE PROGESTERONE RECEPTOR        NEGATIVE     0%        POSITIVE        =>1% All controls stained appropriately     11/08/2023 PET scan   IMPRESSION: Bilateral mastectomy with expected posttreatment changes in right chest wall.   Interval resolution of metabolic activity of the osseous metastasis and right axillary and supraclavicular lymphadenopathy consistent with favorable response to treatment.   Stable left lower lobe pulmonary nodule, below PET resolution. Follow-up according to oncologic protocol. No suspicious finding to suggest locally recurrent malignancy, new nodal or distant metastasis.   New focal uptake in right thyroid  lobe may represent a hypermetabolic thyroid  nodule. Recommend correlation with ultrasound.      Discussed the use of AI scribe software for clinical note transcription with the patient, who gave verbal consent to proceed.  History of Present Illness [name] is a 64 year old female with metastatic ER+ breast cancer with bone metastases who presents for oncology follow-up to assess disease status and treatment tolerance.  She was diagnosed with breast cancer in 2018, which progressed to stage IV with diffuse osseous metastases. Disease burden has been stable since bone involvement. PET in September showed interval improvement, and tumor markers decreased from 1200 in May to 270 in October.  She is taking abemaciclib 100 mg on a 3-weeks-on, 1-week-off schedule, with a plan to increase to 125 mg next cycle if tolerated. She receives Zemaira infusions every three months for bone health, with the next dose planned for today.  She is tolerating abemaciclib with only mild intermittent nausea, improved compared with prior bendamustine. She prefers 10 mg dosing of antiemetics. She  denies diarrhea or constipation. Appetite remains poor. Weight declined from 170 lb to 118 lb, mostly before abemaciclib, and she has not regained  weight.  She notes reduced energy. She can complete household tasks but needs rest afterward. She is concerned about muscle loss and lower extremity weakness and wants to regain strength and body mass.  She denies dental pain or problems and had a normal dental evaluation within the past three months. Recent labs show improving but still mildly elevated liver enzymes. She saw palliative care three months ago and will follow up again today. She reports no difficulty obtaining medications from [facility] and local pharmacies.     All other systems were reviewed with the patient and are negative.  MEDICAL HISTORY:  Past Medical History:  Diagnosis Date   Anxiety    Anxiety disorder    Asthma    h/o asthma as a child   Breast cancer (HCC)    Cancer (HCC) 04/2016   right breast cancer   Cholelithiasis    Chronic kidney disease    obstruction of R kidney, ( not a stone) - currently resolved    Depression    Diverticulosis    DJD (degenerative joint disease)    hands & back    Dyspnea    resolved since she stopped smoking    Epigastric abdominal pain    Esophageal stricture    Family history of adverse reaction to anesthesia    daughter has N&V, takes long time to wake up    Family history of colon cancer    Family history of ovarian cancer    Family hx of colon cancer    Female pelvic peritoneal adhesions 10/26/2012   Ganglion cyst    GERD (gastroesophageal reflux disease)    Headache    low grade currently , family history of migraines    Hemorrhoid    History of radiation therapy 05/03 - 08/11/2016   1. 4 field Right breast was treated to 50.4 Gy in 25 fractions at 1.8 Gy per fraction. 2. The Right breast was boosted to 10 Gy in 5 fractions at 2 Gy per fraction.   Hypertension    Hypothyroidism    Plantar fasciitis, bilateral    PONV (postoperative nausea and vomiting)    gets anxious with the mask on her face, also remarks that the scop. patch has helped in the past        SURGICAL HISTORY: Past Surgical History:  Procedure Laterality Date   ABDOMINAL HYSTERECTOMY     BREAST IMPLANT REMOVAL Bilateral 01/24/2019   Procedure: REMOVAL BREAST IMPLANTS;  Surgeon: Arelia Filippo, MD;  Location: [facility];  Service: Plastics;  Laterality: Bilateral;   BREAST RECONSTRUCTION WITH PLACEMENT OF TISSUE EXPANDER AND FLEX HD (ACELLULAR HYDRATED DERMIS) Bilateral 05/19/2016   Procedure: BREAST RECONSTRUCTION WITH PLACEMENT OF TISSUE EXPANDER AND ALLODERM PLACEMENT;  Surgeon: Filippo Arelia, MD;  Location: [facility];  Service: Plastics;  Laterality: Bilateral;   BREAST RECONSTRUCTION WITH PLACEMENT OF TISSUE EXPANDER AND FLEX HD (ACELLULAR HYDRATED DERMIS) Left 06/28/2017   Procedure: LEFT BREAST RECONSTRUCTION WITH SILICONE IMPLANT EXCHANGE AND ACELLULARDERMIS TO LEFT CHEST;  Surgeon: Arelia Filippo, MD;  Location: [facility];  Service: Plastics;  Laterality: Left;   CAPSULECTOMY Bilateral 01/24/2019   Procedure: CAPSULECTOMY;  Surgeon: Arelia Filippo, MD;  Location:  SURGERY CENTER;  Service: Plastics;  Laterality: Bilateral;   CESAREAN SECTION  1988   CHOLECYSTECTOMY     HEMORRHOID  SURGERY     LAPAROSCOPIC BILATERAL SALPINGECTOMY N/A 10/26/2012   Procedure: operative laparoscopy with lysis of adhesions;  Surgeon: Ezzie Marshall, MD;  Location: [facility];  Service: Gynecology;  Laterality: N/A;   LESION EXCISION WITH COMPLEX REPAIR Right 06/09/2019   Procedure: COMPLEX REPAIR RIGHT CHEST 15CM;  Surgeon: Arelia Filippo, MD;  Location: [facility];  Service: Plastics;  Laterality: Right;   MASTECTOMY Bilateral    MASTECTOMY WITH RADIOACTIVE SEED GUIDED EXCISION AND AXILLARY SENTINEL LYMPH NODE BIOPSY Bilateral 05/19/2016   Procedure: RIGHT SKIN SPARING MASTECTOMY WITH RIGHT RADIOACTIVE SEED TARGETED DISSECTION AND RIGHT SENTINEL LYMPH NODE BIOPSY, LEFT PROPHYLACTIC SKIN SPARING MASTECTOMY;  Surgeon:  Donnice Bury, MD;  Location: [facility];  Service: General;  Laterality: Bilateral;   REMOVAL OF BILATERAL TISSUE EXPANDERS WITH PLACEMENT OF BILATERAL BREAST IMPLANTS Bilateral 01/29/2017   Procedure: REMOVAL OF BILATERAL TISSUE EXPANDERS WITH PLACEMENT OF BILATERAL SILICONE BREAST IMPLANTS, ALLODERM TO LEFT BREAST RECONSTRUCTION;  RIGHT LATISSUMUS FLAP;  Surgeon: Arelia Filippo, MD;  Location: [facility];  Service: Plastics;  Laterality: Bilateral;  Requesting RNFA   SCAR REVISION Right 06/28/2017   Procedure: COMPLEX REVISION OF BACK SCAR;  Surgeon: Arelia Filippo, MD;  Location:  SUR[facility]ERY C[facility];  Service: Plastics;  Laterality: Right;   TONSILLECTOMY     TUBAL LIGATION     urologic surgery for ureteropelvic junction obstruction      I have reviewed the social history and family history with the patient and they are unchanged from previous note.  ALLERGIES:  is allergic to adhesive [tape], promethazine hcl, and sulfa  antibiotics.  MEDICATIONS:  Current Outpatient Medications  Medication Sig Dispense Refill   ALPRAZolam  (XANAX ) 1 MG tablet Take 1 tablet by mouth 3 (three) times daily.     celecoxib  (CELEBREX ) 200 MG capsule Take 1 capsule (200 mg total) by mouth daily. 30 capsule 2   gabapentin  (NEURONTIN ) 100 MG capsule Take 1 capsule every day by oral route at dinner. (Patient taking differently: Take 100 mg by mouth as needed.)     omeprazole  (PRILOSEC) 40 MG capsule TAKE 1 CAPSULE BY MOUTH TWICE A DAY 180 capsule 2   ondansetron  (ZOFRAN ) 8 MG tablet TAKE 1 TABLET (8 MG TOTAL) BY MOUTH EVERY 4 (FOUR) HOURS AS NEEDED FOR NAUSEA OR VOMITING. 135 tablet 1   palbociclib  (IBRANCE ) 125 MG tablet Take 1 tablet (125 mg total) by mouth daily. Take for 21 days on, 7 days off, repeat every 28 days. 21 tablet 0   potassium chloride  (KLOR-CON  M) 10 MEQ tablet TAKE 1 TABLET BY MOUTH 2 TIMES DAILY. 180 tablet 0   prochlorperazine  (COMPAZINE ) 10 MG tablet Take 1 tablet (10 mg  total) by mouth every 6 (six) hours as needed for nausea or vomiting. 60 tablet 3   venlafaxine  XR (EFFEXOR -XR) 150 MG 24 hr capsule TAKE 1 CAPSULE BY MOUTH EVERY DAY WITH BREAKFAST 90 capsule 1   venlafaxine  XR (EFFEXOR -XR) 75 MG 24 hr capsule TAKE 1 CAPSULE BY MOUTH DAILY WITH BREAKFAST. 90 capsule 2   anastrozole  (ARIMIDEX ) 1 MG tablet Take 1 tablet (1 mg total) by mouth daily. 90 tablet 3   levothyroxine  (SYNTHROID ) 25 MCG tablet Take 25 mcg by mouth daily.     oxyCODONE -acetaminophen  (PERCOCET/ROXICET) 5-325 MG tablet Take 1 tablet by mouth every 6 (six) hours as needed for severe pain (pain score 7-10). 60 tablet 0   zolpidem  (AMBIEN ) 10 MG tablet Take 1 tablet (10 mg total) by mouth at bedtime as  needed for sleep. 30 tablet 3   No current facility-administered medications for this visit.    PHYSICAL EXAMINATION: ECOG PERFORMANCE STATUS: 1 - Symptomatic but completely ambulatory  Vitals:   03/13/24 1049  BP: 123/82  Pulse: 88  Resp: 17  Temp: 97.8 F (36.6 C)  SpO2: 99%   Wt Readings from Last 3 Encounters:  03/13/24 117 lb 14.4 oz (53.5 kg)  12/28/23 117 lb 9.6 oz (53.3 kg)  12/10/23 117 lb 4.8 oz (53.2 kg)     GENERAL:alert, no distress and comfortable SKIN: skin color, texture, turgor are normal, no rashes or significant lesions EYES: normal, Conjunctiva are pink and non-injected, sclera clear NECK: supple, thyroid  normal size, non-tender, without nodularity LYMPH:  no palpable lymphadenopathy in the cervical, axillary  LUNGS: clear to auscultation and percussion with normal breathing effort HEART: regular rate & rhythm and no murmurs and no lower extremity edema ABDOMEN:abdomen soft, non-tender and normal bowel sounds Musculoskeletal:no cyanosis of digits and no clubbing  NEURO: alert & oriented x 3 with fluent speech, no focal motor/sensory deficits  Physical Exam MEASUREMENTS: Weight- 118.  LABORATORY DATA:  I have reviewed the data as listed    Latest Ref  Rng & Units 03/13/2024   10:20 AM 12/10/2023   12:08 PM 11/18/2023   12:43 PM  CBC  WBC 4.0 - 10.5 K/uL 2.3  3.0  3.7   Hemoglobin 12.0 - 15.0 g/dL 85.6  85.6  85.8   Hematocrit 36.0 - 46.0 % 40.5  41.1  40.8   Platelets 150 - 400 K/uL 37  151  131         Latest Ref Rng & Units 03/13/2024   10:20 AM 12/10/2023    1:49 PM 11/18/2023   12:43 PM  CMP  Glucose 70 - 99 mg/dL 99  872  89   BUN 8 - 23 mg/dL 14  15  18    Creatinine 0.44 - 1.00 mg/dL 9.15  9.30  9.23   Sodium 135 - 145 mmol/L 140  141  140   Potassium 3.5 - 5.1 mmol/L 4.0  4.0  4.5   Chloride 98 - 111 mmol/L 103  104  102   CO2 22 - 32 mmol/L 25  30  34   Calcium 8.9 - 10.3 mg/dL 9.3  9.7  9.3   Total Protein 6.5 - 8.1 g/dL 6.8  6.5  6.5   Total Bilirubin 0.0 - 1.2 mg/dL 0.8  0.6  0.5   Alkaline Phos 38 - 126 U/L 83  77  90   AST 15 - 41 U/L 118  110  108   ALT 0 - 44 U/L 95  109  92       RADIOGRAPHIC STUDIES: I have personally reviewed the radiological images as listed and agreed with the findings in the report. No results found.    Orders Placed This Encounter  Procedures   NM PET Image Restage (PS) Skull Base to Thigh (F-18 FDG)    Standing Status:   Future    Expected Date:   05/02/2024    Expiration Date:   03/13/2025    If indicated for the ordered procedure, I authorize the administration of a radiopharmaceutical per Radiology protocol:   Yes    Preferred imaging location?:   [facility]   All questions were answered. The patient knows to call the clinic with any problems, questions or concerns. No barriers to learning was detected. The total time spent in the  appointment was 30 minutes, including review of chart and various tests results, discussions about plan of care and coordination of care plan     Onita Mattock, MD 03/13/2024     "

## 2024-03-13 NOTE — Patient Instructions (Addendum)
 [facility] - A DEPT OF [facility]  Discharge Instructions: Thank you for choosing [facility] to provide your oncology and hematology care.   If you have a lab appointment with the Cancer Center, please go directly to the Cancer Center and check in at the registration area.   Wear comfortable clothing and clothing appropriate for easy access to any Portacath or PICC line.   We strive to give you quality time with your provider. You may need to reschedule your appointment if you arrive late (15 or more minutes).  Arriving late affects you and other patients whose appointments are after yours.  Also, if you miss three or more appointments without notifying the office, you may be dismissed from the clinic at the provider's discretion.      For prescription refill requests, have your pharmacy contact our office and allow 72 hours for refills to be completed.    Today you received the following chemotherapy and/or immunotherapy agents: Zoledronic  Acid (Zometa )   To help prevent nausea and vomiting after your treatment, we encourage you to take your nausea medication as directed.  BELOW ARE SYMPTOMS THAT SHOULD BE REPORTED IMMEDIATELY: *FEVER GREATER THAN 100.4 F (38 C) OR HIGHER *CHILLS OR SWEATING *NAUSEA AND VOMITING THAT IS NOT CONTROLLED WITH YOUR NAUSEA MEDICATION *UNUSUAL SHORTNESS OF BREATH *UNUSUAL BRUISING OR BLEEDING *URINARY PROBLEMS (pain or burning when urinating, or frequent urination) *BOWEL PROBLEMS (unusual diarrhea, constipation, pain near the anus) TENDERNESS IN MOUTH AND THROAT WITH OR WITHOUT PRESENCE OF ULCERS (sore throat, sores in mouth, or a toothache) UNUSUAL RASH, SWELLING OR PAIN  UNUSUAL VAGINAL DISCHARGE OR ITCHING   Items with * indicate a potential emergency and should be followed up as soon as possible or go to the Emergency Department if any problems should occur.  Please show the CHEMOTHERAPY ALERT CARD or  IMMUNOTHERAPY ALERT CARD at check-in to the Emergency Department and triage nurse.  Should you have questions after your visit or need to cancel or reschedule your appointment, please contact [facility] - A DEPT OF [facility]  Dept: [phone]  and follow the prompts.  Office hours are 8:00 a.m. to 4:30 p.m. Monday - Friday. Please note that voicemails left after 4:00 p.m. may not be returned until the following business day.  We are closed weekends and major holidays. You have access to a nurse at all times for urgent questions. Please call the main number to the clinic Dept: [phone] and follow the prompts.   For any non-urgent questions, you may also contact your provider using MyChart. We now offer e-Visits for anyone 40 and older to request care online for non-urgent symptoms. For details visit [email or web address].   Also download the MyChart app! Go to the app store, search MyChart, open the app, select [place], and log in with your MyChart username and password.

## 2024-03-14 ENCOUNTER — Other Ambulatory Visit: Payer: Self-pay

## 2024-03-14 LAB — CANCER ANTIGEN 27.29: CA 27.29: 310 U/mL — ABNORMAL HIGH (ref 0.0–38.6)

## 2024-03-16 ENCOUNTER — Inpatient Hospital Stay

## 2024-03-28 ENCOUNTER — Other Ambulatory Visit: Payer: Self-pay

## 2024-03-28 MED ORDER — PALBOCICLIB 125 MG PO TABS: 125.0000 mg | ORAL_TABLET | Freq: Every day | ORAL | 0 refills | Status: AC

## 2024-04-10 ENCOUNTER — Inpatient Hospital Stay: Attending: Nurse Practitioner

## 2024-05-10 ENCOUNTER — Inpatient Hospital Stay: Attending: Nurse Practitioner

## 2024-05-10 ENCOUNTER — Inpatient Hospital Stay: Admitting: Nurse Practitioner

## 2024-05-10 ENCOUNTER — Inpatient Hospital Stay
